# Patient Record
Sex: Female | Born: 1955 | Race: Black or African American | Hispanic: No | Marital: Married | Smoking: Never smoker
Health system: Southern US, Community
[De-identification: ages and names within clinical notes are randomized; demographics above are authoritative.]

## PROBLEM LIST (undated history)

## (undated) DIAGNOSIS — F339 Major depressive disorder, recurrent, unspecified: Secondary | ICD-10-CM

## (undated) DIAGNOSIS — Z950 Presence of cardiac pacemaker: Secondary | ICD-10-CM

## (undated) DIAGNOSIS — R591 Generalized enlarged lymph nodes: Secondary | ICD-10-CM

## (undated) DIAGNOSIS — E785 Hyperlipidemia, unspecified: Secondary | ICD-10-CM

## (undated) DIAGNOSIS — I499 Cardiac arrhythmia, unspecified: Secondary | ICD-10-CM

## (undated) DIAGNOSIS — D352 Benign neoplasm of pituitary gland: Secondary | ICD-10-CM

## (undated) DIAGNOSIS — I2510 Atherosclerotic heart disease of native coronary artery without angina pectoris: Secondary | ICD-10-CM

## (undated) DIAGNOSIS — M1990 Unspecified osteoarthritis, unspecified site: Secondary | ICD-10-CM

## (undated) DIAGNOSIS — N189 Chronic kidney disease, unspecified: Secondary | ICD-10-CM

## (undated) DIAGNOSIS — I495 Sick sinus syndrome: Secondary | ICD-10-CM

## (undated) DIAGNOSIS — I639 Cerebral infarction, unspecified: Secondary | ICD-10-CM

## (undated) DIAGNOSIS — IMO0001 Reserved for inherently not codable concepts without codable children: Secondary | ICD-10-CM

## (undated) HISTORY — DX: Cerebral infarction, unspecified: I63.9

## (undated) HISTORY — DX: Major depressive disorder, recurrent, unspecified: F33.9

## (undated) HISTORY — DX: Unspecified osteoarthritis, unspecified site: M19.90

## (undated) HISTORY — DX: Benign neoplasm of pituitary gland: D35.2

## (undated) HISTORY — DX: Atherosclerotic heart disease of native coronary artery without angina pectoris: I25.10

## (undated) HISTORY — PX: ABDOMINAL HYSTERECTOMY: SHX81

## (undated) HISTORY — DX: Generalized enlarged lymph nodes: R59.1

## (undated) HISTORY — DX: Sick sinus syndrome: I49.5

## (undated) HISTORY — DX: Hyperlipidemia, unspecified: E78.5

## (undated) HISTORY — DX: Chronic kidney disease, unspecified: N18.9

---

## 2005-07-05 ENCOUNTER — Ambulatory Visit: Payer: Self-pay | Admitting: Internal Medicine

## 2006-08-22 ENCOUNTER — Other Ambulatory Visit: Payer: Self-pay

## 2006-08-24 ENCOUNTER — Ambulatory Visit: Payer: Self-pay | Admitting: Obstetrics & Gynecology

## 2006-09-13 ENCOUNTER — Ambulatory Visit: Payer: Self-pay | Admitting: Neurology

## 2007-09-12 ENCOUNTER — Ambulatory Visit: Payer: Self-pay | Admitting: Family Medicine

## 2008-03-07 ENCOUNTER — Other Ambulatory Visit: Payer: Self-pay

## 2008-03-07 ENCOUNTER — Ambulatory Visit: Payer: Self-pay | Admitting: Obstetrics & Gynecology

## 2008-03-11 ENCOUNTER — Inpatient Hospital Stay: Payer: Self-pay | Admitting: Obstetrics & Gynecology

## 2009-01-26 ENCOUNTER — Encounter: Payer: Self-pay | Admitting: Nurse Practitioner

## 2009-02-12 ENCOUNTER — Encounter: Payer: Self-pay | Admitting: Nurse Practitioner

## 2009-03-14 ENCOUNTER — Encounter: Payer: Self-pay | Admitting: Nurse Practitioner

## 2009-04-14 ENCOUNTER — Encounter: Payer: Self-pay | Admitting: Nurse Practitioner

## 2010-10-25 ENCOUNTER — Ambulatory Visit: Payer: Self-pay | Admitting: Family Medicine

## 2011-12-20 ENCOUNTER — Ambulatory Visit: Payer: Self-pay | Admitting: Family Medicine

## 2012-12-20 ENCOUNTER — Ambulatory Visit: Payer: Self-pay | Admitting: Family Medicine

## 2013-11-14 HISTORY — PX: PACEMAKER INSERTION: SHX728

## 2014-06-19 ENCOUNTER — Ambulatory Visit: Payer: Self-pay | Admitting: Cardiology

## 2014-06-19 LAB — BASIC METABOLIC PANEL
Anion Gap: 8 (ref 7–16)
BUN: 12 mg/dL (ref 7–18)
Calcium, Total: 9.1 mg/dL (ref 8.5–10.1)
Chloride: 104 mmol/L (ref 98–107)
Co2: 28 mmol/L (ref 21–32)
Creatinine: 1.23 mg/dL (ref 0.60–1.30)
EGFR (African American): 56 — ABNORMAL LOW
EGFR (Non-African Amer.): 48 — ABNORMAL LOW
Glucose: 100 mg/dL — ABNORMAL HIGH (ref 65–99)
Osmolality: 279 (ref 275–301)
Potassium: 3.4 mmol/L — ABNORMAL LOW (ref 3.5–5.1)
Sodium: 140 mmol/L (ref 136–145)

## 2014-06-19 LAB — CBC WITH DIFFERENTIAL/PLATELET
BASOS PCT: 4.5 %
Basophil #: 0.3 10*3/uL — ABNORMAL HIGH (ref 0.0–0.1)
Eosinophil #: 0.1 10*3/uL (ref 0.0–0.7)
Eosinophil %: 1.5 %
HCT: 42.3 % (ref 35.0–47.0)
HGB: 13.5 g/dL (ref 12.0–16.0)
LYMPHS ABS: 1.2 10*3/uL (ref 1.0–3.6)
LYMPHS PCT: 16.3 %
MCH: 29.5 pg (ref 26.0–34.0)
MCHC: 31.9 g/dL — ABNORMAL LOW (ref 32.0–36.0)
MCV: 92 fL (ref 80–100)
MONO ABS: 0.6 x10 3/mm (ref 0.2–0.9)
Monocyte %: 8.2 %
Neutrophil #: 5.2 10*3/uL (ref 1.4–6.5)
Neutrophil %: 69.5 %
Platelet: 369 10*3/uL (ref 150–440)
RBC: 4.58 10*6/uL (ref 3.80–5.20)
RDW: 15.4 % — ABNORMAL HIGH (ref 11.5–14.5)
WBC: 7.6 10*3/uL (ref 3.6–11.0)

## 2014-06-19 LAB — PROTIME-INR
INR: 1
Prothrombin Time: 13.2 secs (ref 11.5–14.7)

## 2014-06-19 LAB — APTT: Activated PTT: 35.4 secs (ref 23.6–35.9)

## 2014-06-24 ENCOUNTER — Ambulatory Visit: Payer: Self-pay | Admitting: Cardiology

## 2014-07-12 ENCOUNTER — Emergency Department: Payer: Self-pay | Admitting: Emergency Medicine

## 2014-07-12 LAB — CBC WITH DIFFERENTIAL/PLATELET
BASOS PCT: 0.7 %
Basophil #: 0 10*3/uL (ref 0.0–0.1)
EOS PCT: 1.4 %
Eosinophil #: 0.1 10*3/uL (ref 0.0–0.7)
HCT: 44.3 % (ref 35.0–47.0)
HGB: 14.1 g/dL (ref 12.0–16.0)
LYMPHS ABS: 1.9 10*3/uL (ref 1.0–3.6)
LYMPHS PCT: 26 %
MCH: 29.2 pg (ref 26.0–34.0)
MCHC: 31.8 g/dL — ABNORMAL LOW (ref 32.0–36.0)
MCV: 92 fL (ref 80–100)
MONOS PCT: 12.1 %
Monocyte #: 0.9 x10 3/mm (ref 0.2–0.9)
NEUTROS ABS: 4.3 10*3/uL (ref 1.4–6.5)
NEUTROS PCT: 59.8 %
PLATELETS: 291 10*3/uL (ref 150–440)
RBC: 4.83 10*6/uL (ref 3.80–5.20)
RDW: 14.5 % (ref 11.5–14.5)
WBC: 7.1 10*3/uL (ref 3.6–11.0)

## 2014-07-12 LAB — COMPREHENSIVE METABOLIC PANEL
ALBUMIN: 3.4 g/dL (ref 3.4–5.0)
ANION GAP: 10 (ref 7–16)
Alkaline Phosphatase: 78 U/L
BILIRUBIN TOTAL: 0.3 mg/dL (ref 0.2–1.0)
BUN: 15 mg/dL (ref 7–18)
CO2: 28 mmol/L (ref 21–32)
Calcium, Total: 9 mg/dL (ref 8.5–10.1)
Chloride: 99 mmol/L (ref 98–107)
Creatinine: 1.37 mg/dL — ABNORMAL HIGH (ref 0.60–1.30)
EGFR (Non-African Amer.): 42 — ABNORMAL LOW
GFR CALC AF AMER: 49 — AB
GLUCOSE: 102 mg/dL — AB (ref 65–99)
OSMOLALITY: 275 (ref 275–301)
Potassium: 3.4 mmol/L — ABNORMAL LOW (ref 3.5–5.1)
SGOT(AST): 28 U/L (ref 15–37)
SGPT (ALT): 38 U/L
SODIUM: 137 mmol/L (ref 136–145)
Total Protein: 8.4 g/dL — ABNORMAL HIGH (ref 6.4–8.2)

## 2014-07-12 LAB — LIPASE, BLOOD: LIPASE: 374 U/L (ref 73–393)

## 2014-07-12 LAB — TROPONIN I: Troponin-I: <0.02 ng/mL

## 2014-07-13 LAB — URINALYSIS, COMPLETE
Bilirubin,UR: NEGATIVE
Blood: NEGATIVE
Glucose,UR: NEGATIVE mg/dL (ref 0–75)
Hyaline Cast: 1
Ketone: NEGATIVE
Nitrite: POSITIVE
PH: 6 (ref 4.5–8.0)
PROTEIN: NEGATIVE
RBC,UR: 10 /HPF (ref 0–5)
SPECIFIC GRAVITY: 1.02 (ref 1.003–1.030)
Squamous Epithelial: 4
WBC UR: 34 /HPF (ref 0–5)

## 2014-12-26 DIAGNOSIS — I639 Cerebral infarction, unspecified: Secondary | ICD-10-CM | POA: Diagnosis not present

## 2014-12-26 DIAGNOSIS — R001 Bradycardia, unspecified: Secondary | ICD-10-CM | POA: Diagnosis not present

## 2014-12-26 DIAGNOSIS — I495 Sick sinus syndrome: Secondary | ICD-10-CM | POA: Diagnosis not present

## 2014-12-26 DIAGNOSIS — E669 Obesity, unspecified: Secondary | ICD-10-CM | POA: Diagnosis not present

## 2015-01-08 DIAGNOSIS — I495 Sick sinus syndrome: Secondary | ICD-10-CM | POA: Diagnosis not present

## 2015-03-07 NOTE — Op Note (Signed)
PATIENT NAME:  Mary Webb, BENEDICT MR#:  [id number] DATE OF BIRTH:  03-13-1956  DATE OF PROCEDURE:  06/24/2014  PREPROCEDURE DIAGNOSIS: Type 2 second-degree atrioventricular block.   PROCEDURE: Dual chamber pacemaker implantation.   POST PROCEDURE DIAGNOSIS: Atrial sensing with ventricular pacing.   INDICATION: The patient is a 59 year old female who was found to be bradycardic. Holter monitor revealed type 2 second-degree atrioventricular block with 2:1 conduction with a heart rate of 34 BPM.   DESCRIPTION OF PROCEDURE: The risks, benefits and alternatives of permanent pacemaker implantation were explained to the patient and informed written consent was obtained. She was brought to the Operating Room in a fasting state. The left pectoral region was prepped and draped in the usual sterile manner. Anesthesia was obtained with 1% Xylocaine locally. A 6 cm incision was performed over the left pectoral region. The pacemaker pocket was generated by electrocautery and blunt dissection. Access was obtained to the left subclavian vein by fine needle aspiration. MRI compatible ventricular (5076) and atrial (5706) leads were positioned to the right ventricular apical septum and right atrial appendage under fluoroscopic guidance. After proper thresholds were obtained, the leads were sutured in place. The pacemaker pocket was irrigated with gentamicin solution. The leads were connected to an MRI compatible, dual-chamber, rate-responsive pacemaker generator (Medtronic [id number]) and positioned into the pocket. The pocket was closed with 2-0 and 4-0 Vicryl, respectively. Steri-Strips and pressure dressing were applied.    ____________________________ Isaias Cowman, MD ap:TT D: 06/24/2014 15:01:47 ET T: 06/24/2014 17:38:27 ET JOB#: [id number]  cc: Isaias Cowman, MD, <Dictator> Isaias Cowman MD ELECTRONICALLY SIGNED 07/17/2014 15:59

## 2015-04-28 ENCOUNTER — Encounter: Payer: Self-pay | Admitting: Family Medicine

## 2015-04-28 ENCOUNTER — Ambulatory Visit (INDEPENDENT_AMBULATORY_CARE_PROVIDER_SITE_OTHER): Payer: Medicare Other | Admitting: Family Medicine

## 2015-04-28 VITALS — BP 141/83 | HR 71 | Temp 98.1°F | Ht 63.0 in | Wt 356.0 lb

## 2015-04-28 DIAGNOSIS — I6322 Cerebral infarction due to unspecified occlusion or stenosis of basilar arteries: Secondary | ICD-10-CM

## 2015-04-28 DIAGNOSIS — D352 Benign neoplasm of pituitary gland: Secondary | ICD-10-CM | POA: Diagnosis not present

## 2015-04-28 DIAGNOSIS — I495 Sick sinus syndrome: Secondary | ICD-10-CM | POA: Diagnosis not present

## 2015-04-28 DIAGNOSIS — Z6841 Body Mass Index (BMI) 40.0 and over, adult: Secondary | ICD-10-CM | POA: Diagnosis not present

## 2015-04-28 DIAGNOSIS — I639 Cerebral infarction, unspecified: Secondary | ICD-10-CM | POA: Insufficient documentation

## 2015-04-28 DIAGNOSIS — I2510 Atherosclerotic heart disease of native coronary artery without angina pectoris: Secondary | ICD-10-CM

## 2015-04-28 DIAGNOSIS — Z8673 Personal history of transient ischemic attack (TIA), and cerebral infarction without residual deficits: Secondary | ICD-10-CM | POA: Insufficient documentation

## 2015-04-28 DIAGNOSIS — E785 Hyperlipidemia, unspecified: Secondary | ICD-10-CM | POA: Diagnosis not present

## 2015-04-28 DIAGNOSIS — I10 Essential (primary) hypertension: Secondary | ICD-10-CM

## 2015-04-28 DIAGNOSIS — I129 Hypertensive chronic kidney disease with stage 1 through stage 4 chronic kidney disease, or unspecified chronic kidney disease: Secondary | ICD-10-CM | POA: Insufficient documentation

## 2015-04-28 DIAGNOSIS — N183 Chronic kidney disease, stage 3 (moderate): Secondary | ICD-10-CM

## 2015-04-28 MED ORDER — LOSARTAN POTASSIUM 100 MG PO TABS: 100.0000 mg | ORAL_TABLET | Freq: Every day | ORAL | Status: DC

## 2015-04-28 MED ORDER — POTASSIUM CHLORIDE 20 MEQ PO PACK: 20.0000 meq | PACK | Freq: Two times a day (BID) | ORAL | Status: DC

## 2015-04-28 MED ORDER — CHLORTHALIDONE 25 MG PO TABS: 25.0000 mg | ORAL_TABLET | Freq: Every day | ORAL | Status: DC

## 2015-04-28 MED ORDER — CLOPIDOGREL BISULFATE 75 MG PO TABS: 75.0000 mg | ORAL_TABLET | Freq: Every day | ORAL | Status: DC

## 2015-04-28 NOTE — Progress Notes (Signed)
BP 141/83 mmHg  Pulse 71  Temp(Src) 98.1 F (36.7 C)  Ht 5\' 3"  (1.6 m)  Wt 356 lb (161.481 kg)  BMI 63.08 kg/m2  SpO2 98%   Subjective:    Patient ID: Mary Webb, female    DOB: 29-Dec-1955, 59 y.o.   MRN: [id number]  HPI: Mary Webb is a 59 y.o. female  Chief Complaint  Patient presents with  . Hypertension  . Obesity  BP doing well no side effects to meds which are taken regularly. No edema PND No stroke sx no TIA sx Taking Plavix with no problems Pt massively obese  Reviewed sleep apnea sx and no sx   Relevant past medical, surgical, family and social history reviewed and updated as indicated. Interim medical history since our last visit reviewed. Allergies and medications reviewed and updated.  Review of Systems  Constitutional: Negative.   Respiratory: Negative.   Cardiovascular: Negative.     Per HPI unless specifically indicated above     Objective:    BP 141/83 mmHg  Pulse 71  Temp(Src) 98.1 F (36.7 C)  Ht 5\' 3"  (1.6 m)  Wt 356 lb (161.481 kg)  BMI 63.08 kg/m2  SpO2 98%  Wt Readings from Last 3 Encounters:  04/28/15 356 lb (161.481 kg)  10/23/14 343 lb (155.584 kg)    Physical Exam  Constitutional: She is oriented to person, place, and time. She appears well-developed and well-nourished. No distress.  HENT:  Head: Normocephalic and atraumatic.  Right Ear: Hearing normal.  Left Ear: Hearing normal.  Nose: Nose normal.  Eyes: Conjunctivae and lids are normal. Right eye exhibits no discharge. Left eye exhibits no discharge. No scleral icterus.  Cardiovascular: Normal rate, regular rhythm and normal heart sounds.   Pulmonary/Chest: Effort normal and breath sounds normal. No respiratory distress.  Musculoskeletal: Normal range of motion.  Neurological: She is alert and oriented to person, place, and time.  Skin: Skin is intact. No rash noted.  Psychiatric: She has a normal mood and affect. Her speech is normal and behavior is normal.  Judgment and thought content normal. Cognition and memory are normal.        Assessment & Plan:   Problem List Items Addressed This Visit      Cardiovascular and Mediastinum   Sick sinus syndrome (Chronic)   Relevant Medications   chlorthalidone (HYGROTON) 25 MG tablet   losartan (COZAAR) 100 MG tablet   Other Relevant Orders   CBC with Differential/Platelet   Urinalysis, Routine w reflex microscopic (not at [facility])   TSH   Lipid panel   Comprehensive metabolic panel   Hypertension (Chronic)    The current medical regimen is effective; continue present plan and medications      Relevant Medications   chlorthalidone (HYGROTON) 25 MG tablet   losartan (COZAAR) 100 MG tablet   potassium chloride (KLOR-CON) 20 MEQ packet   Other Relevant Orders   CBC with Differential/Platelet   Urinalysis, Routine w reflex microscopic (not at [facility])   TSH   Lipid panel   Comprehensive metabolic panel     Endocrine   Pituitary adenoma (Chronic)   Relevant Orders   CBC with Differential/Platelet   Urinalysis, Routine w reflex microscopic (not at [facility])   TSH   Lipid panel   Comprehensive metabolic panel     Nervous and Auditory   CVA (cerebral infarction)    The current medical regimen is effective; continue present plan and medications  Relevant Medications   clopidogrel (PLAVIX) 75 MG tablet   Other Relevant Orders   CBC with Differential/Platelet   Urinalysis, Routine w reflex microscopic (not at [facility])   TSH   Lipid panel   Comprehensive metabolic panel     Other   BMI 60.0-69.9, adult (Chronic)    Discussed diet and exercise and weight loss      Relevant Orders   CBC with Differential/Platelet   Urinalysis, Routine w reflex microscopic (not at [facility])   TSH   Lipid panel   Comprehensive metabolic panel   Hyperlipidemia (Chronic)   Relevant Medications   chlorthalidone (HYGROTON) 25 MG tablet   losartan (COZAAR) 100 MG tablet   Other Relevant Orders   CBC with  Differential/Platelet   Urinalysis, Routine w reflex microscopic (not at [facility])   TSH   Lipid panel   Comprehensive metabolic panel    Other Visit Diagnoses    Atherosclerosis of native coronary artery of native heart without angina pectoris    -  Primary    Relevant Medications    chlorthalidone (HYGROTON) 25 MG tablet    losartan (COZAAR) 100 MG tablet    Other Relevant Orders    Basic metabolic panel    CBC with Differential/Platelet    Urinalysis, Routine w reflex microscopic (not at [facility])    TSH    Lipid panel    Comprehensive metabolic panel        Follow up plan: Return in about 6 months (around 10/28/2015) for Physical Exam.

## 2015-04-28 NOTE — Assessment & Plan Note (Signed)
The current medical regimen is effective;  continue present plan and medications.  

## 2015-04-28 NOTE — Assessment & Plan Note (Signed)
Discussed diet and exercise and weight loss.

## 2015-04-29 LAB — BASIC METABOLIC PANEL
BUN/Creatinine Ratio: 11 (ref 9–23)
BUN: 12 mg/dL (ref 6–24)
CALCIUM: 9.6 mg/dL (ref 8.7–10.2)
CHLORIDE: 99 mmol/L (ref 97–108)
CO2: 28 mmol/L (ref 18–29)
Creatinine, Ser: 1.08 mg/dL — ABNORMAL HIGH (ref 0.57–1.00)
GFR calc Af Amer: 65 mL/min/{1.73_m2} (ref >59)
GFR calc non Af Amer: 56 mL/min/{1.73_m2} — ABNORMAL LOW (ref >59)
GLUCOSE: 115 mg/dL — AB (ref 65–99)
POTASSIUM: 3.5 mmol/L (ref 3.5–5.2)
Sodium: 140 mmol/L (ref 134–144)

## 2015-04-29 NOTE — Addendum Note (Signed)
Addended byGolden Pop on: 04/29/2015 12:31 PM   Modules accepted: Miquel Dunn

## 2015-07-07 DIAGNOSIS — I495 Sick sinus syndrome: Secondary | ICD-10-CM | POA: Diagnosis not present

## 2015-07-13 DIAGNOSIS — E669 Obesity, unspecified: Secondary | ICD-10-CM | POA: Diagnosis not present

## 2015-07-13 DIAGNOSIS — R001 Bradycardia, unspecified: Secondary | ICD-10-CM | POA: Diagnosis not present

## 2015-07-13 DIAGNOSIS — I639 Cerebral infarction, unspecified: Secondary | ICD-10-CM | POA: Diagnosis not present

## 2015-07-13 DIAGNOSIS — G4733 Obstructive sleep apnea (adult) (pediatric): Secondary | ICD-10-CM | POA: Diagnosis not present

## 2015-07-21 DIAGNOSIS — H40019 Open angle with borderline findings, low risk, unspecified eye: Secondary | ICD-10-CM | POA: Diagnosis not present

## 2015-08-10 DIAGNOSIS — H40019 Open angle with borderline findings, low risk, unspecified eye: Secondary | ICD-10-CM | POA: Diagnosis not present

## 2015-09-25 IMAGING — CR DG CHEST 2V
1 series · 2 of 2 positions shown · non-contrast
Comparison: None.

CLINICAL DATA: Hypertension.

EXAM:
CHEST  2 VIEW

[Series 1: w chest pa · 0.14mm/px · 2 of 2 slices shown]
[im 1/2]
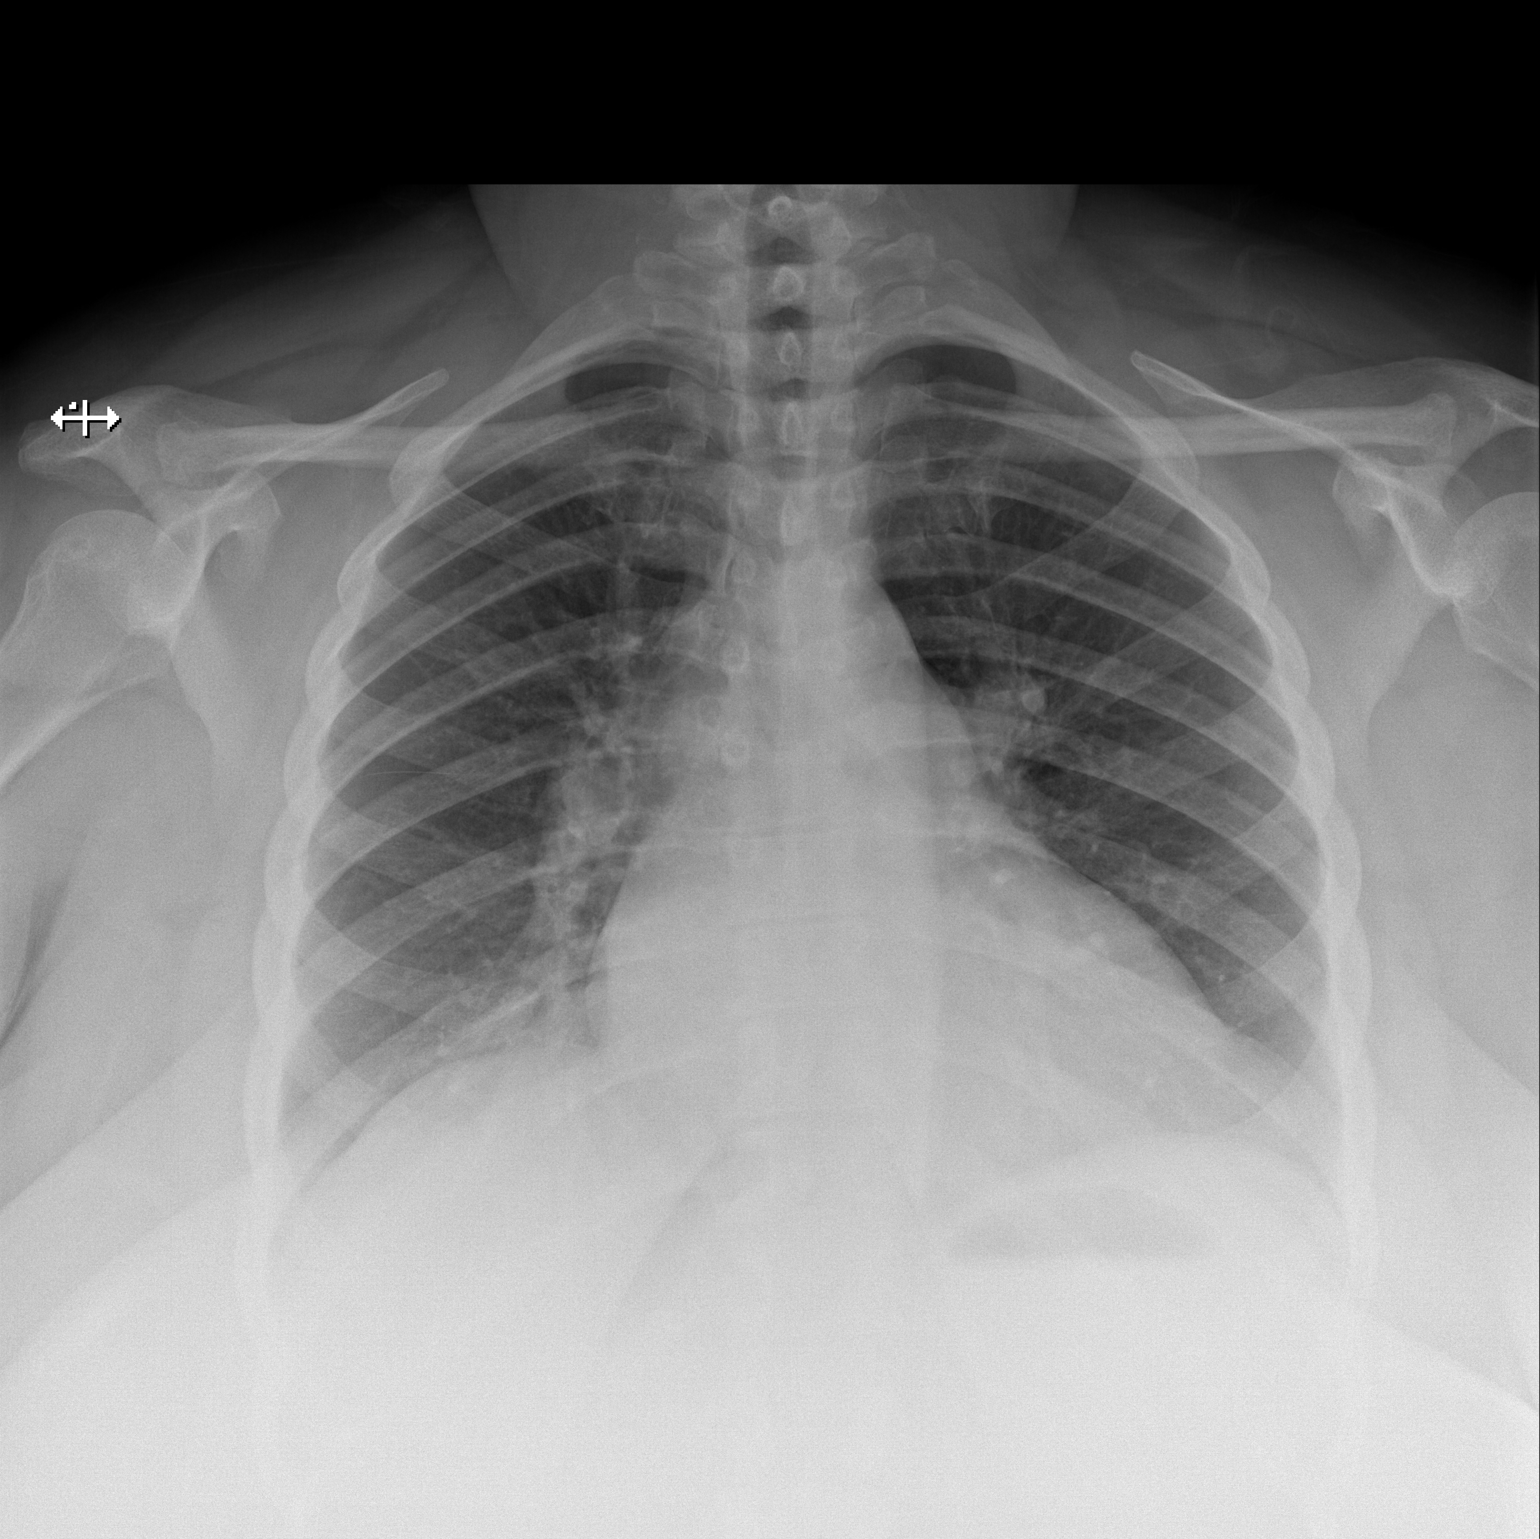
[im 2/2]
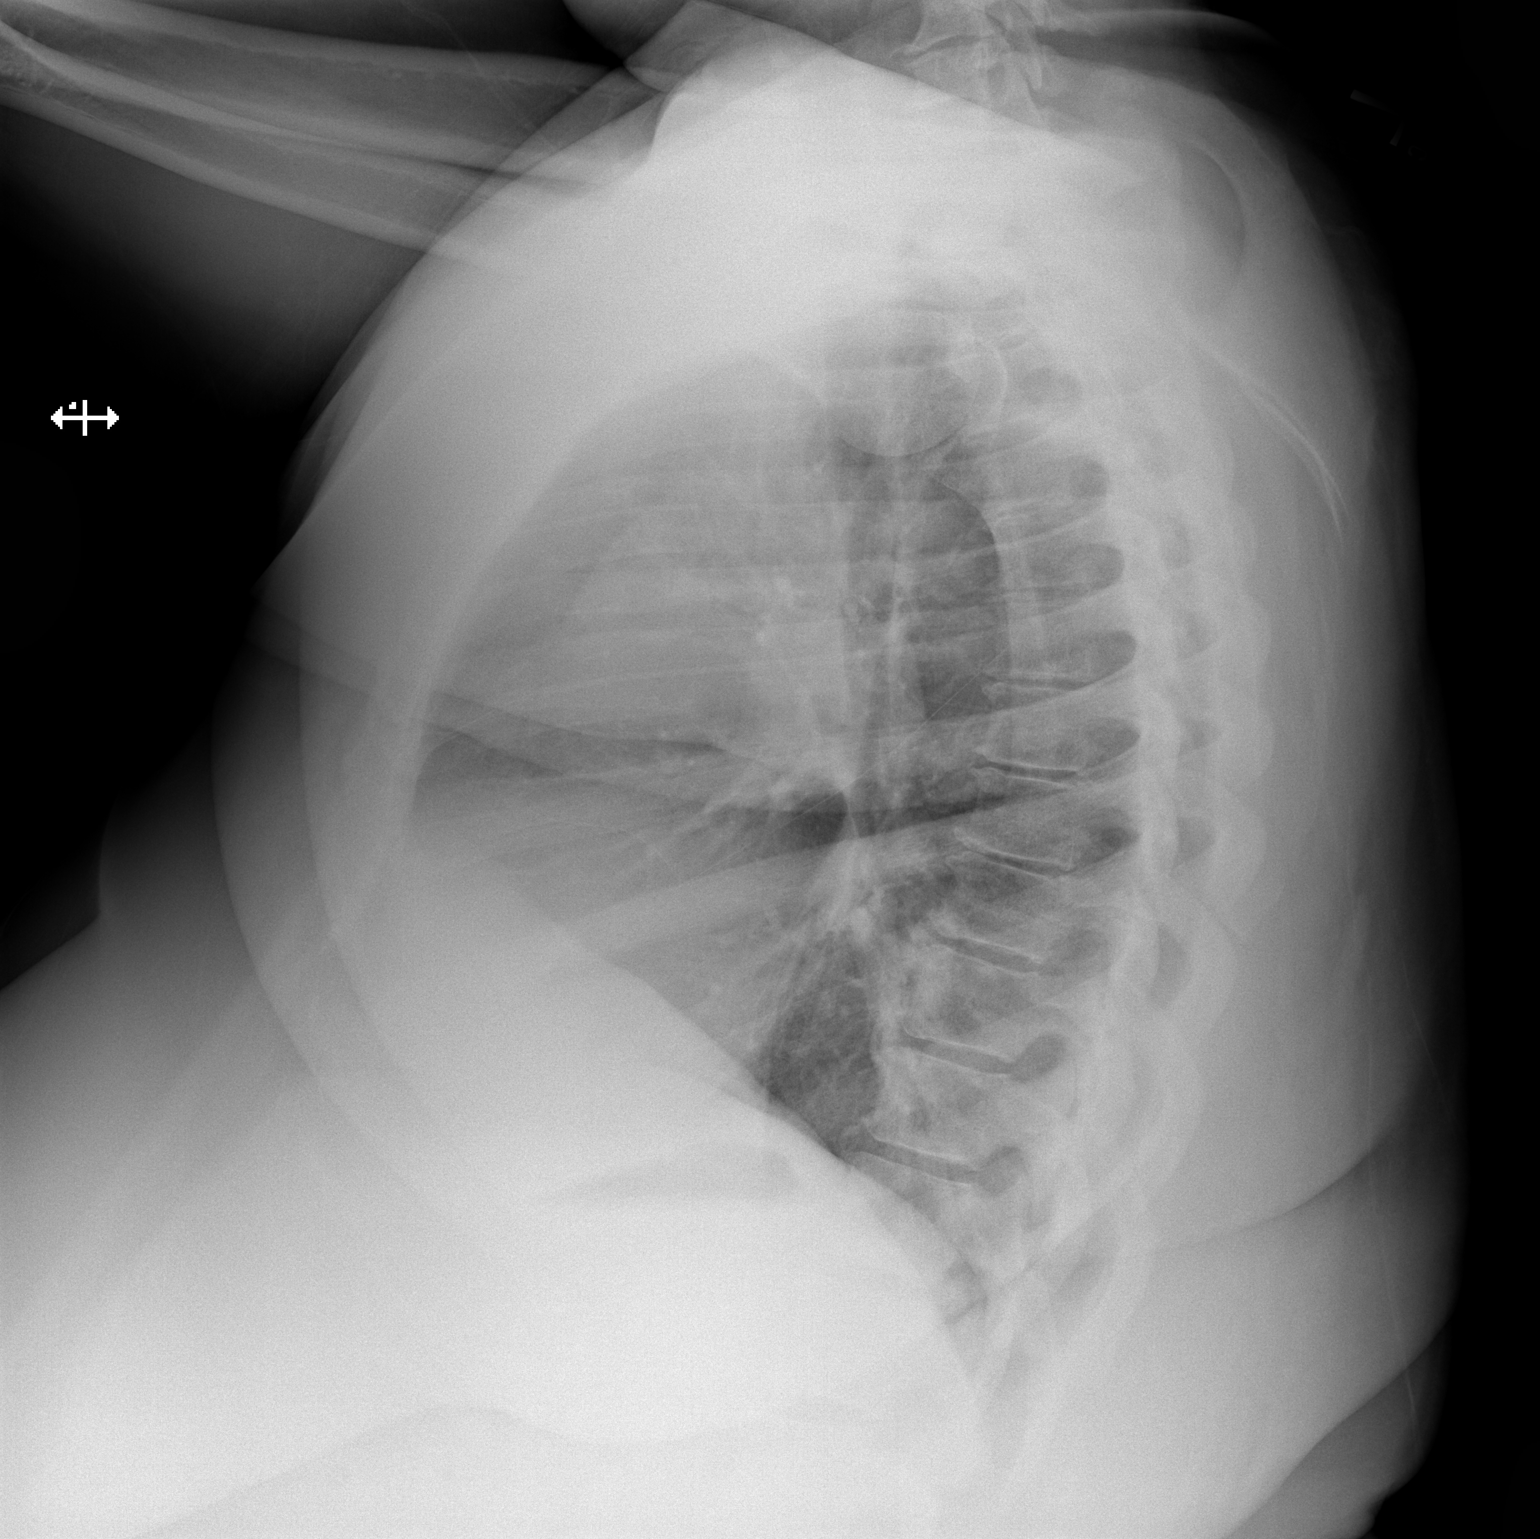

[2 of 2 positions shown; findings below may reference images not displayed]

FINDINGS: Cardiomegaly. Pulmonary vascularity is normal. Mild right base
atelectasis versus infiltrate. No pleural effusion or pneumothorax.
No acute bony abnormality .
IMPRESSION: 1. Mild right base atelectasis and/or infiltrate.
2. Cardiomegaly.  No evidence of overt congestive heart failure.

## 2015-09-30 IMAGING — CR DG CHEST 1V PORT
1 series · 1 of 1 positions shown · non-contrast
Comparison: 06/19/2014

CLINICAL DATA: Status post pacemaker placement

EXAM:
PORTABLE CHEST - 1 VIEW

[ap]
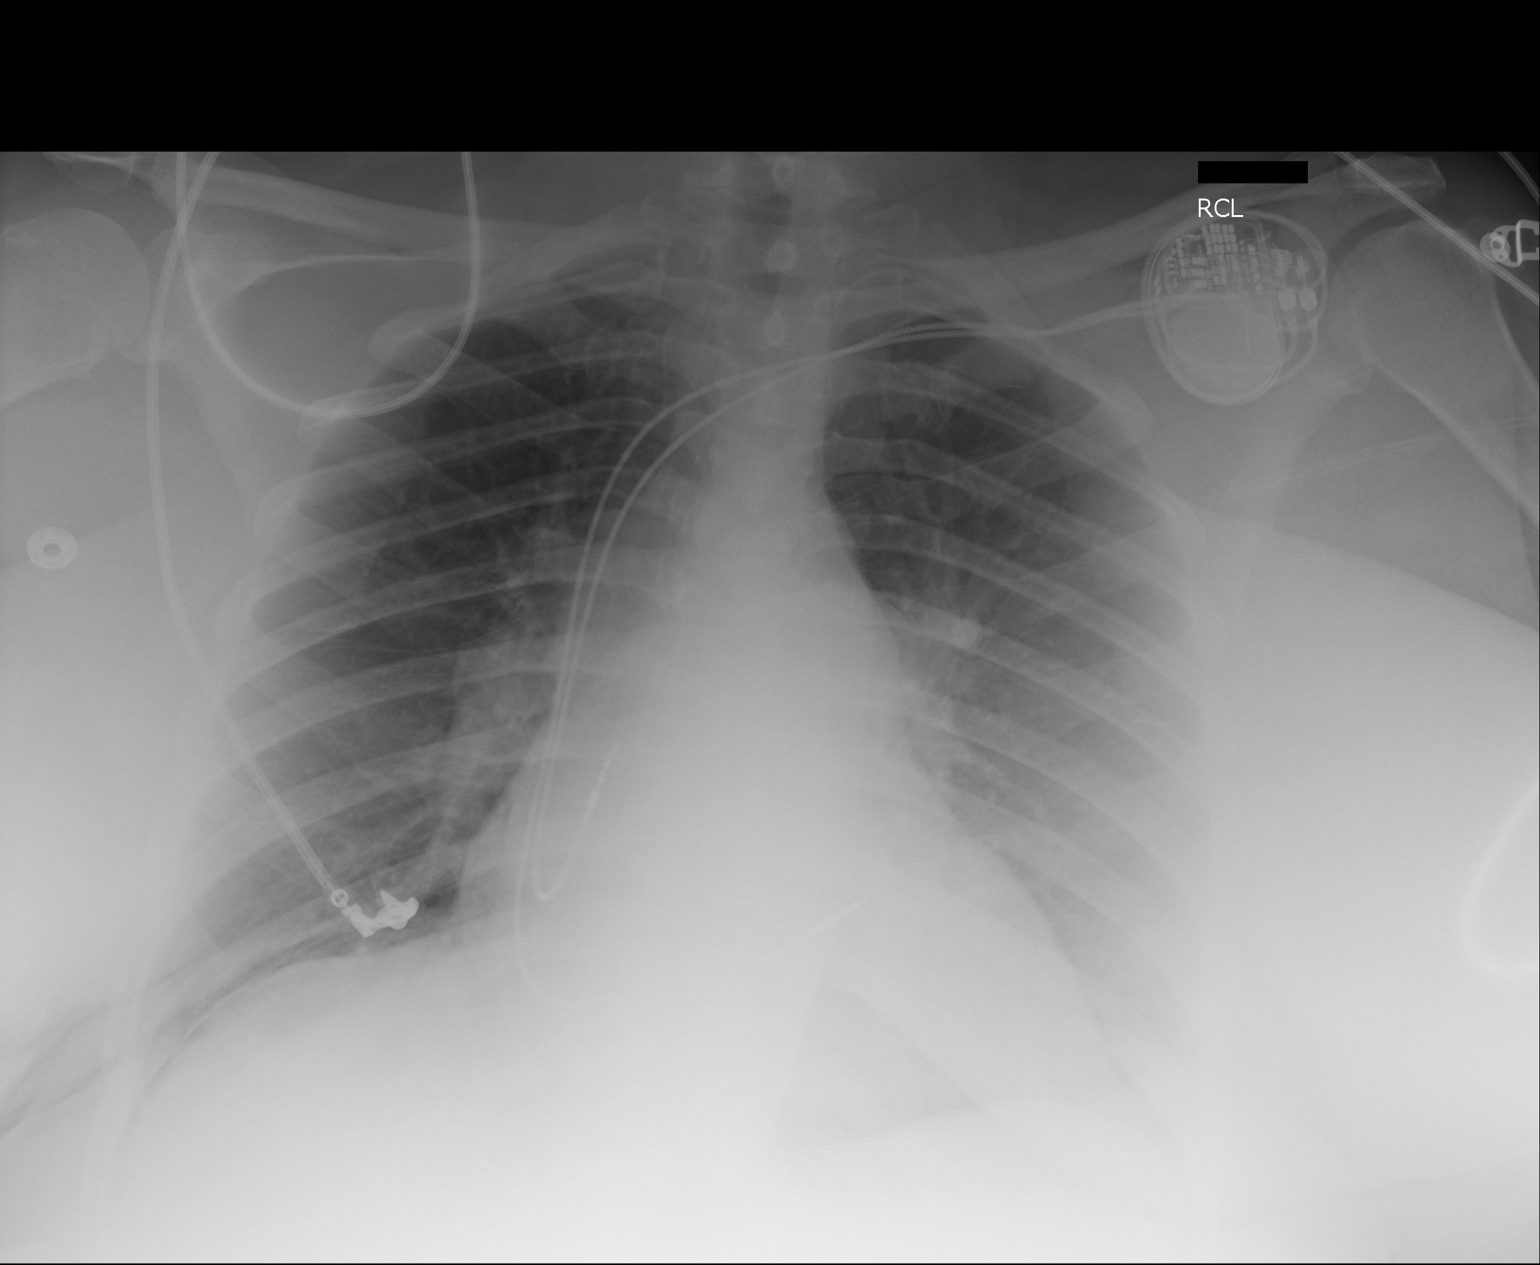

[1 of 1 positions shown; findings below may reference images not displayed]

FINDINGS: A pacing device has been placed in the interim. No pneumothorax is
noted. The lungs are clear. The cardiac shadow is stable.
IMPRESSION: No pneumothorax following pacer device placement.

## 2015-10-26 ENCOUNTER — Ambulatory Visit (INDEPENDENT_AMBULATORY_CARE_PROVIDER_SITE_OTHER): Payer: Medicare Other | Admitting: Family Medicine

## 2015-10-26 ENCOUNTER — Encounter: Payer: Self-pay | Admitting: Family Medicine

## 2015-10-26 VITALS — BP 118/79 | HR 66 | Temp 98.6°F | Ht 62.3 in | Wt 371.0 lb

## 2015-10-26 DIAGNOSIS — Z1211 Encounter for screening for malignant neoplasm of colon: Secondary | ICD-10-CM | POA: Diagnosis not present

## 2015-10-26 DIAGNOSIS — I10 Essential (primary) hypertension: Secondary | ICD-10-CM | POA: Diagnosis not present

## 2015-10-26 DIAGNOSIS — I495 Sick sinus syndrome: Secondary | ICD-10-CM

## 2015-10-26 DIAGNOSIS — Z1239 Encounter for other screening for malignant neoplasm of breast: Secondary | ICD-10-CM | POA: Diagnosis not present

## 2015-10-26 DIAGNOSIS — N183 Chronic kidney disease, stage 3 (moderate): Secondary | ICD-10-CM | POA: Diagnosis not present

## 2015-10-26 DIAGNOSIS — Z6841 Body Mass Index (BMI) 40.0 and over, adult: Secondary | ICD-10-CM | POA: Diagnosis not present

## 2015-10-26 DIAGNOSIS — I129 Hypertensive chronic kidney disease with stage 1 through stage 4 chronic kidney disease, or unspecified chronic kidney disease: Secondary | ICD-10-CM | POA: Diagnosis not present

## 2015-10-26 DIAGNOSIS — D352 Benign neoplasm of pituitary gland: Secondary | ICD-10-CM

## 2015-10-26 DIAGNOSIS — I6322 Cerebral infarction due to unspecified occlusion or stenosis of basilar arteries: Secondary | ICD-10-CM | POA: Diagnosis not present

## 2015-10-26 DIAGNOSIS — Z0000 Encounter for general adult medical examination without abnormal findings: Secondary | ICD-10-CM

## 2015-10-26 LAB — URINALYSIS, ROUTINE W REFLEX MICROSCOPIC
Bilirubin, UA: NEGATIVE
GLUCOSE, UA: NEGATIVE
Ketones, UA: NEGATIVE
Nitrite, UA: NEGATIVE
PH UA: 7.5 (ref 5.0–7.5)
PROTEIN UA: NEGATIVE
RBC, UA: NEGATIVE
Specific Gravity, UA: 1.02 (ref 1.005–1.030)
UUROB: 1 mg/dL (ref 0.2–1.0)

## 2015-10-26 LAB — MICROSCOPIC EXAMINATION

## 2015-10-26 MED ORDER — POTASSIUM CHLORIDE 20 MEQ PO PACK: 20.0000 meq | PACK | Freq: Two times a day (BID) | ORAL | Status: DC

## 2015-10-26 MED ORDER — CHLORTHALIDONE 25 MG PO TABS: 25.0000 mg | ORAL_TABLET | Freq: Every day | ORAL | Status: DC

## 2015-10-26 MED ORDER — LOSARTAN POTASSIUM 100 MG PO TABS: 100.0000 mg | ORAL_TABLET | Freq: Every day | ORAL | Status: DC

## 2015-10-26 MED ORDER — CLOPIDOGREL BISULFATE 75 MG PO TABS: 75.0000 mg | ORAL_TABLET | Freq: Every day | ORAL | Status: DC

## 2015-10-26 NOTE — Assessment & Plan Note (Signed)
Followed by cardiology 

## 2015-10-26 NOTE — Progress Notes (Signed)
BP 118/79 mmHg  Pulse 66  Temp(Src) 98.6 F (37 C)  Ht 5' 2.3" (1.582 m)  Wt 371 lb (168.284 kg)  BMI 67.24 kg/m2  SpO2 99%   Subjective:    Patient ID: Mary Webb, female    DOB: [birth year], 59 y.o.   MRN: [id number]  HPI: Mary Webb is a 59 y.o. female  Chief Complaint  Patient presents with  . Annual Exam   doing well concerned about weight has been trying to do better with husband's diabetic diet which she is on also he is doing well and she is struggling Blood pressures been doing well with no complaints No chest pain chest tightness No complaints with edema No complaints from medications which he takes faithfully No visual complaints sees her eye doctor from time to time with no visual field concerns Has not had any follow-up of pituitary adenoma for some time. On chart review looks like last one was 2012 Patient followed with cardiology on pacemaker  Relevant past medical, surgical, family and social history reviewed and updated as indicated. Interim medical history since our last visit reviewed. Allergies and medications reviewed and updated.  Review of Systems  Constitutional: Negative.   HENT: Negative.   Eyes: Negative.   Respiratory: Negative.   Cardiovascular: Negative.   Gastrointestinal: Negative.   Endocrine: Negative.   Genitourinary: Negative.   Musculoskeletal: Negative.   Skin: Negative.   Allergic/Immunologic: Negative.   Neurological: Negative.   Hematological: Negative.   Psychiatric/Behavioral: Negative.     Per HPI unless specifically indicated above     Objective:    BP 118/79 mmHg  Pulse 66  Temp(Src) 98.6 F (37 C)  Ht 5' 2.3" (1.582 m)  Wt 371 lb (168.284 kg)  BMI 67.24 kg/m2  SpO2 99%  Wt Readings from Last 3 Encounters:  10/26/15 371 lb (168.284 kg)  04/28/15 356 lb (161.481 kg)  10/23/14 343 lb (155.584 kg)    Physical Exam  Constitutional: She is oriented to person, place, and time. She appears  well-developed and well-nourished.  HENT:  Head: Normocephalic and atraumatic.  Right Ear: External ear normal.  Left Ear: External ear normal.  Nose: Nose normal.  Mouth/Throat: Oropharynx is clear and moist.  Eyes: Conjunctivae and EOM are normal. Pupils are equal, round, and reactive to light.  Neck: Normal range of motion. Neck supple. Carotid bruit is not present.  Cardiovascular: Normal rate, regular rhythm and normal heart sounds.   No murmur heard. Pulmonary/Chest: Effort normal and breath sounds normal. She exhibits no mass. Right breast exhibits no mass, no skin change and no tenderness. Left breast exhibits no mass, no skin change and no tenderness. Breasts are symmetrical.  Abdominal: Soft. Bowel sounds are normal. There is no hepatosplenomegaly.  Musculoskeletal: Normal range of motion.  Neurological: She is alert and oriented to person, place, and time.  Skin: No rash noted.  Psychiatric: She has a normal mood and affect. Her behavior is normal. Judgment and thought content normal.    Results for orders placed or performed in visit on [id number]  Basic metabolic panel  Result Value Ref Range   Glucose 115 (H) 65 - 99 mg/dL   BUN 12 6 - 24 mg/dL   Creatinine, Ser 1.08 (H) 0.57 - 1.00 mg/dL   GFR calc non Af Amer 56 (L) >59 mL/min/1.73   GFR calc Af Amer 65 >59 mL/min/1.73   BUN/Creatinine Ratio 11 9 - 23   Sodium 140 134 - 144  mmol/L   Potassium 3.5 3.5 - 5.2 mmol/L   Chloride 99 97 - 108 mmol/L   CO2 28 18 - 29 mmol/L   Calcium 9.6 8.7 - 10.2 mg/dL      Assessment & Plan:   Problem List Items Addressed This Visit      Cardiovascular and Mediastinum   Sick sinus syndrome (HCC) (Chronic)    Followed by cardiology      Relevant Medications   chlorthalidone (HYGROTON) 25 MG tablet   clopidogrel (PLAVIX) 75 MG tablet   losartan (COZAAR) 100 MG tablet   Other Relevant Orders   Comprehensive metabolic panel   Lipid panel   CBC with Differential/Platelet    Urinalysis, Routine w reflex microscopic (not at [facility])   TSH     Endocrine   Pituitary adenoma ([place]) (Chronic)    Patient with no symptoms visual or otherwise On chart review patient's last MRI was 2012 Patient now with pacemaker After discussion with radiology will find out if pacemaker is MRI safe compatible if so will schedule MRI if not will need CT scan with focus on the sella      Relevant Orders   Comprehensive metabolic panel   Lipid panel   CBC with Differential/Platelet   Urinalysis, Routine w reflex microscopic (not at [facility])   TSH     Nervous and Auditory   CVA (cerebral infarction)   Relevant Medications   clopidogrel (PLAVIX) 75 MG tablet   Other Relevant Orders   Lipid panel   CBC with Differential/Platelet   Urinalysis, Routine w reflex microscopic (not at [facility])   TSH     Genitourinary   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       Relevant Medications   chlorthalidone (HYGROTON) 25 MG tablet   losartan (COZAAR) 100 MG tablet   potassium chloride (KLOR-CON) 20 MEQ packet   Other Relevant Orders   Comprehensive metabolic panel   Lipid panel   CBC with Differential/Platelet   Urinalysis, Routine w reflex microscopic (not at [facility])   TSH     Other   BMI 60.0-69.9, adult (HCC) (Chronic)    Discussed diet and ewxercise       Other Visit Diagnoses    Colon cancer screening    -  Primary    Relevant Orders    Ambulatory referral to General Surgery    Breast cancer screening        Relevant Orders    MM Digital Screening    PE (physical exam), annual        Essential hypertension  (Chronic)       Relevant Medications    chlorthalidone (HYGROTON) 25 MG tablet    losartan (COZAAR) 100 MG tablet    potassium chloride (KLOR-CON) 20 MEQ packet        Follow up plan: Return in about 6 months (around 04/25/2016) for BMP.

## 2015-10-26 NOTE — Assessment & Plan Note (Signed)
Discussed diet and ewxercise

## 2015-10-26 NOTE — Assessment & Plan Note (Signed)
The current medical regimen is effective;  continue present plan and medications.  

## 2015-10-26 NOTE — Assessment & Plan Note (Signed)
Patient with no symptoms visual or otherwise On chart review patient's last MRI was 2012 Patient now with pacemaker After discussion with radiology will find out if pacemaker is MRI safe compatible if so will schedule MRI if not will need CT scan with focus on the sella

## 2015-10-27 ENCOUNTER — Encounter: Payer: Self-pay | Admitting: Family Medicine

## 2015-10-27 LAB — LIPID PANEL
Chol/HDL Ratio: 3.6 ratio units (ref 0.0–4.4)
Cholesterol, Total: 153 mg/dL (ref 100–199)
HDL: 42 mg/dL (ref >39)
LDL CALC: 93 mg/dL (ref 0–99)
Triglycerides: 89 mg/dL (ref 0–149)
VLDL CHOLESTEROL CAL: 18 mg/dL (ref 5–40)

## 2015-10-27 LAB — COMPREHENSIVE METABOLIC PANEL
ALBUMIN: 4 g/dL (ref 3.6–4.8)
ALT: 18 IU/L (ref 0–32)
AST: 14 IU/L (ref 0–40)
Albumin/Globulin Ratio: 1.3 (ref 1.1–2.5)
Alkaline Phosphatase: 76 IU/L (ref 39–117)
BUN / CREAT RATIO: 12 (ref 11–26)
BUN: 15 mg/dL (ref 8–27)
Bilirubin Total: 0.4 mg/dL (ref 0.0–1.2)
CALCIUM: 9.4 mg/dL (ref 8.7–10.3)
CO2: 27 mmol/L (ref 18–29)
CREATININE: 1.21 mg/dL — AB (ref 0.57–1.00)
Chloride: 98 mmol/L (ref 96–106)
GFR calc non Af Amer: 49 mL/min/{1.73_m2} — ABNORMAL LOW (ref >59)
GFR, EST AFRICAN AMERICAN: 56 mL/min/{1.73_m2} — AB (ref >59)
GLUCOSE: 105 mg/dL — AB (ref 65–99)
Globulin, Total: 3.1 g/dL (ref 1.5–4.5)
Potassium: 3.9 mmol/L (ref 3.5–5.2)
Sodium: 139 mmol/L (ref 134–144)
TOTAL PROTEIN: 7.1 g/dL (ref 6.0–8.5)

## 2015-10-27 LAB — CBC WITH DIFFERENTIAL/PLATELET
BASOS: 0 %
Basophils Absolute: 0 10*3/uL (ref 0.0–0.2)
EOS (ABSOLUTE): 0.1 10*3/uL (ref 0.0–0.4)
Eos: 2 %
Hematocrit: 42.7 % (ref 34.0–46.6)
Hemoglobin: 14.2 g/dL (ref 11.1–15.9)
IMMATURE GRANULOCYTES: 0 %
Immature Grans (Abs): 0 10*3/uL (ref 0.0–0.1)
LYMPHS ABS: 2.2 10*3/uL (ref 0.7–3.1)
Lymphs: 32 %
MCH: 29.3 pg (ref 26.6–33.0)
MCHC: 33.3 g/dL (ref 31.5–35.7)
MCV: 88 fL (ref 79–97)
MONOS ABS: 0.7 10*3/uL (ref 0.1–0.9)
Monocytes: 11 %
Neutrophils Absolute: 3.8 10*3/uL (ref 1.4–7.0)
Neutrophils: 55 %
PLATELETS: 295 10*3/uL (ref 150–379)
RBC: 4.84 x10E6/uL (ref 3.77–5.28)
RDW: 14.8 % (ref 12.3–15.4)
WBC: 6.9 10*3/uL (ref 3.4–10.8)

## 2015-10-27 LAB — TSH: TSH: 1.86 u[IU]/mL (ref 0.450–4.500)

## 2015-11-25 ENCOUNTER — Telehealth: Payer: Self-pay | Admitting: Gastroenterology

## 2015-11-25 ENCOUNTER — Other Ambulatory Visit: Payer: Self-pay

## 2015-11-25 NOTE — Telephone Encounter (Signed)
Gastroenterology Pre-Procedure Review  Request Date: 12/10/15 Requesting Physician: Dr. Jeananne Rama   PATIENT REVIEW QUESTIONS: The patient responded to the following health history questions as indicated:    1. Are you having any GI issues? no 2. Do you have a personal history of Polyps? no 3. Do you have a family history of Colon Cancer or Polyps? no 4. Diabetes Mellitus? no 5. Joint replacements in the past 12 months?no 6. Major health problems in the past 3 months?no 7. Any artificial heart valves, MVP, or defibrillator? pacemaker    MEDICATIONS & ALLERGIES:    Patient reports the following regarding taking any anticoagulation/antiplatelet therapy:   Plavix, Coumadin, Eliquis, Xarelto, Lovenox, Pradaxa, Brilinta, or Effient? yes (Plavix 75mg ) Aspirin? no  Patient confirms/reports the following medications:  Current Outpatient Prescriptions  Medication Sig Dispense Refill  . Ascorbic Acid (VITAMIN C) 100 MG tablet Take 100 mg by mouth daily.    . chlorthalidone (HYGROTON) 25 MG tablet Take 1 tablet (25 mg total) by mouth daily. 90 tablet 4  . cholecalciferol (VITAMIN D) 400 UNITS TABS tablet Take 400 Units by mouth.    . clopidogrel (PLAVIX) 75 MG tablet Take 1 tablet (75 mg total) by mouth daily. 90 tablet 4  . losartan (COZAAR) 100 MG tablet Take 1 tablet (100 mg total) by mouth daily. 90 tablet 4  . potassium chloride (KLOR-CON) 20 MEQ packet Take 20 mEq by mouth 2 (two) times daily. 180 tablet 4  . pyridOXINE (VITAMIN B-6) 25 MG tablet Take by mouth.     No current facility-administered medications for this visit.    Patient confirms/reports the following allergies:  Allergies  Allergen Reactions  . Levaquin [Levofloxacin] Other (See Comments)    Stroke  . Tekturna [Aliskiren] Shortness Of Breath  . Accupril [Quinapril Hcl]   . Aspirin   . Diovan [Valsartan]   . Erythromycin   . Iodine   . Lotensin [Benazepril]   . Norvasc [Amlodipine]   . Sulfa Antibiotics   .  Tetanus Toxoids   . Tetracyclines & Related   . Penicillins Rash    No orders of the defined types were placed in this encounter.    AUTHORIZATION INFORMATION Primary Insurance: 1D#: Group #:  Secondary Insurance: 1D#: Group #:  SCHEDULE INFORMATION: Date: 12/10/15 Time: Location: [place]

## 2015-11-25 NOTE — Telephone Encounter (Signed)
Pt scheduled for screening colonoscopy at [facility] on 12/10/15. Pt has [facility] Medicare which requires precert. Will you check? Thanks!

## 2015-11-25 NOTE — Telephone Encounter (Signed)
Patient stated she has called several times and not able to speak to anyone regarding a colonoscopy.

## 2015-12-03 ENCOUNTER — Telehealth: Payer: Self-pay | Admitting: Family Medicine

## 2015-12-03 ENCOUNTER — Telehealth: Payer: Self-pay

## 2015-12-03 NOTE — Telephone Encounter (Signed)
Messaged Park Liter, DO from Dr. Rance Muir office. Okayed from her for pt to stop Plavix 5 days prior to her Colonoscopy that is scheduled for 12/10/15. Pt has been notified.

## 2015-12-03 NOTE — Telephone Encounter (Signed)
Dr. Allen Norris wants plavix stopped. OK not to stop. Can stop for 5 days. CMA informed.

## 2015-12-04 ENCOUNTER — Encounter: Payer: Self-pay | Admitting: Anesthesiology

## 2015-12-04 ENCOUNTER — Other Ambulatory Visit: Payer: Self-pay

## 2015-12-10 ENCOUNTER — Ambulatory Visit: Admission: RE | Admit: 2015-12-10 | Payer: Medicare Other | Source: Ambulatory Visit | Admitting: Gastroenterology

## 2015-12-10 ENCOUNTER — Encounter: Admission: RE | Payer: Self-pay | Source: Ambulatory Visit

## 2015-12-10 HISTORY — DX: Presence of cardiac pacemaker: Z95.0

## 2015-12-10 HISTORY — DX: Cerebral infarction, unspecified: I63.9

## 2015-12-10 HISTORY — DX: Reserved for inherently not codable concepts without codable children: IMO0001

## 2015-12-10 HISTORY — DX: Cardiac arrhythmia, unspecified: I49.9

## 2015-12-10 SURGERY — COLONOSCOPY WITH PROPOFOL
Anesthesia: Choice

## 2015-12-22 DIAGNOSIS — I495 Sick sinus syndrome: Secondary | ICD-10-CM | POA: Diagnosis not present

## 2016-01-04 ENCOUNTER — Encounter: Payer: Self-pay | Admitting: *Deleted

## 2016-01-05 ENCOUNTER — Ambulatory Visit
Admission: RE | Admit: 2016-01-05 | Discharge: 2016-01-05 | Disposition: A | Discharge status: Home / Self Care | Payer: Medicare Other | Source: Ambulatory Visit | Attending: Gastroenterology | Admitting: Gastroenterology

## 2016-01-05 ENCOUNTER — Ambulatory Visit: Payer: Medicare Other | Admitting: Anesthesiology

## 2016-01-05 ENCOUNTER — Encounter: Payer: Self-pay | Admitting: Anesthesiology

## 2016-01-05 ENCOUNTER — Encounter
Admission: RE | Disposition: A | Discharge status: Home / Self Care | Payer: Self-pay | Source: Ambulatory Visit | Attending: Gastroenterology

## 2016-01-05 DIAGNOSIS — K641 Second degree hemorrhoids: Secondary | ICD-10-CM | POA: Insufficient documentation

## 2016-01-05 DIAGNOSIS — Z7902 Long term (current) use of antithrombotics/antiplatelets: Secondary | ICD-10-CM | POA: Diagnosis not present

## 2016-01-05 DIAGNOSIS — Z8673 Personal history of transient ischemic attack (TIA), and cerebral infarction without residual deficits: Secondary | ICD-10-CM | POA: Insufficient documentation

## 2016-01-05 DIAGNOSIS — Z8249 Family history of ischemic heart disease and other diseases of the circulatory system: Secondary | ICD-10-CM | POA: Diagnosis not present

## 2016-01-05 DIAGNOSIS — Z83511 Family history of glaucoma: Secondary | ICD-10-CM | POA: Insufficient documentation

## 2016-01-05 DIAGNOSIS — I129 Hypertensive chronic kidney disease with stage 1 through stage 4 chronic kidney disease, or unspecified chronic kidney disease: Secondary | ICD-10-CM | POA: Insufficient documentation

## 2016-01-05 DIAGNOSIS — Z888 Allergy status to other drugs, medicaments and biological substances status: Secondary | ICD-10-CM | POA: Insufficient documentation

## 2016-01-05 DIAGNOSIS — Z8349 Family history of other endocrine, nutritional and metabolic diseases: Secondary | ICD-10-CM | POA: Insufficient documentation

## 2016-01-05 DIAGNOSIS — Z882 Allergy status to sulfonamides status: Secondary | ICD-10-CM | POA: Insufficient documentation

## 2016-01-05 DIAGNOSIS — Z886 Allergy status to analgesic agent status: Secondary | ICD-10-CM | POA: Diagnosis not present

## 2016-01-05 DIAGNOSIS — Z79899 Other long term (current) drug therapy: Secondary | ICD-10-CM | POA: Diagnosis not present

## 2016-01-05 DIAGNOSIS — N189 Chronic kidney disease, unspecified: Secondary | ICD-10-CM | POA: Diagnosis not present

## 2016-01-05 DIAGNOSIS — Z887 Allergy status to serum and vaccine status: Secondary | ICD-10-CM | POA: Insufficient documentation

## 2016-01-05 DIAGNOSIS — Z833 Family history of diabetes mellitus: Secondary | ICD-10-CM | POA: Insufficient documentation

## 2016-01-05 DIAGNOSIS — Z1211 Encounter for screening for malignant neoplasm of colon: Secondary | ICD-10-CM | POA: Insufficient documentation

## 2016-01-05 DIAGNOSIS — Z818 Family history of other mental and behavioral disorders: Secondary | ICD-10-CM | POA: Insufficient documentation

## 2016-01-05 DIAGNOSIS — Z950 Presence of cardiac pacemaker: Secondary | ICD-10-CM | POA: Diagnosis not present

## 2016-01-05 DIAGNOSIS — Z881 Allergy status to other antibiotic agents status: Secondary | ICD-10-CM | POA: Insufficient documentation

## 2016-01-05 DIAGNOSIS — E785 Hyperlipidemia, unspecified: Secondary | ICD-10-CM | POA: Insufficient documentation

## 2016-01-05 DIAGNOSIS — I2510 Atherosclerotic heart disease of native coronary artery without angina pectoris: Secondary | ICD-10-CM | POA: Insufficient documentation

## 2016-01-05 DIAGNOSIS — Z8639 Personal history of other endocrine, nutritional and metabolic disease: Secondary | ICD-10-CM | POA: Insufficient documentation

## 2016-01-05 DIAGNOSIS — Z90710 Acquired absence of both cervix and uterus: Secondary | ICD-10-CM | POA: Insufficient documentation

## 2016-01-05 DIAGNOSIS — I495 Sick sinus syndrome: Secondary | ICD-10-CM | POA: Insufficient documentation

## 2016-01-05 DIAGNOSIS — M1991 Primary osteoarthritis, unspecified site: Secondary | ICD-10-CM | POA: Insufficient documentation

## 2016-01-05 DIAGNOSIS — Z880 Allergy status to penicillin: Secondary | ICD-10-CM | POA: Diagnosis not present

## 2016-01-05 DIAGNOSIS — Z91013 Allergy to seafood: Secondary | ICD-10-CM | POA: Diagnosis not present

## 2016-01-05 HISTORY — PX: COLONOSCOPY WITH PROPOFOL: SHX5780

## 2016-01-05 SURGERY — COLONOSCOPY WITH PROPOFOL
Anesthesia: General

## 2016-01-05 MED ORDER — SODIUM CHLORIDE 0.9 % IV SOLN
INTRAVENOUS | Status: DC
  Administered 2016-01-05: 11:00:00 via INTRAVENOUS

## 2016-01-05 MED ORDER — PROPOFOL 500 MG/50ML IV EMUL
INTRAVENOUS | Status: DC | PRN
  Administered 2016-01-05: 100 ug/kg/min via INTRAVENOUS

## 2016-01-05 MED ORDER — SODIUM CHLORIDE 0.9 % IV SOLN: INTRAVENOUS | Status: DC

## 2016-01-05 NOTE — Op Note (Signed)
[facility] Gastroenterology Patient Name: Mary Webb Procedure Date: 01/05/2016 10:48 AM MRN: [id number] Account #: [id number] Date of Birth: 22-Jul-1956 Admit Type: Outpatient Age: 60 Room: [facility] ENDO ROOM 4 Gender: Female Note Status: Finalized Procedure:            Colonoscopy Indications:          Screening for colorectal malignant neoplasm Providers:            Lucilla Lame, MD Referring MD:         Guadalupe Maple, MD (Referring MD) Medicines:            Propofol per Anesthesia Complications:        No immediate complications. Procedure:            Pre-Anesthesia Assessment:                       - Prior to the procedure, a History and Physical was                        performed, and patient medications and allergies were                        reviewed. The patient's tolerance of previous                        anesthesia was also reviewed. The risks and benefits of                        the procedure and the sedation options and risks were                        discussed with the patient. All questions were                        answered, and informed consent was obtained. Prior                        Anticoagulants: The patient has taken no previous                        anticoagulant or antiplatelet agents. ASA Grade                        Assessment: III - A patient with severe systemic                        disease. After reviewing the risks and benefits, the                        patient was deemed in satisfactory condition to undergo                        the procedure.                       After obtaining informed consent, the colonoscope was                        passed under direct vision. Throughout the procedure,  the patient's blood pressure, pulse, and oxygen                        saturations were monitored continuously. The Olympus                        CF-H180AL colonoscope ( S#: [id number] ) was introduced                         through the anus and advanced to the the cecum,                        identified by appendiceal orifice and ileocecal valve.                        The colonoscopy was performed without difficulty. The                        patient tolerated the procedure well. The quality of                        the bowel preparation was excellent. Findings:      The perianal and digital rectal examinations were normal.      Non-bleeding internal hemorrhoids were found during retroflexion. The       hemorrhoids were Grade II (internal hemorrhoids that prolapse but reduce       spontaneously). Impression:           - Non-bleeding internal hemorrhoids.                       - No specimens collected. Recommendation:       - Repeat colonoscopy in 10 years for screening unless                        any change in family history or lower GI problems. Procedure Code(s):    --- Professional ---                       [phone], Colonoscopy, flexible; diagnostic, including                        collection of specimen(s) by brushing or washing, when                        performed (separate procedure) Diagnosis Code(s):    --- Professional ---                       Z12.11, Encounter for screening for malignant neoplasm                        of colon CPT copyright 2016 American [employer]. All rights reserved. The codes documented in this report are preliminary and upon coder review may  be revised to meet current compliance requirements. Lucilla Lame, MD 01/05/2016 11:14:51 AM This report has been signed electronically. Number of Addenda: 0 Note Initiated On: 01/05/2016 10:48 AM Scope Withdrawal Time: 0 hours 6 minutes 42 seconds  Total Procedure Duration: 0 hours 8 minutes 43 seconds       [facility]

## 2016-01-05 NOTE — Anesthesia Postprocedure Evaluation (Signed)
Anesthesia Post Note  Patient: Mary Webb  Procedure(s) Performed: Procedure(s) (LRB): COLONOSCOPY WITH PROPOFOL (N/A)  Patient location during evaluation: Endoscopy Anesthesia Type: General Level of consciousness: awake and alert Pain management: pain level controlled Vital Signs Assessment: post-procedure vital signs reviewed and stable Respiratory status: spontaneous breathing, nonlabored ventilation, respiratory function stable and patient connected to nasal cannula oxygen Cardiovascular status: blood pressure returned to baseline and stable Postop Assessment: no signs of nausea or vomiting Anesthetic complications: no    Last Vitals:  Filed Vitals:   01/05/16 1140 01/05/16 1150  BP: 125/68 115/68  Pulse:  66  Temp:    Resp:  15    Last Pain: There were no vitals filed for this visit.               Precious Haws Piscitello

## 2016-01-05 NOTE — Transfer of Care (Signed)
Immediate Anesthesia Transfer of Care Note  Patient: Mary Webb  Procedure(s) Performed: Procedure(s): COLONOSCOPY WITH PROPOFOL (N/A)  Patient Location: PACU  Anesthesia Type:General  Level of Consciousness: awake, alert  and oriented  Airway & Oxygen Therapy: Patient Spontanous Breathing and Patient connected to nasal cannula oxygen  Post-op Assessment: Report given to RN and Post -op Vital signs reviewed and stable  Post vital signs: Reviewed and stable  Last Vitals:  Filed Vitals:   01/05/16 1014  BP: 132/79  Pulse: 99  Temp: 36 C  Resp: 17    Complications: No apparent anesthesia complications

## 2016-01-05 NOTE — H&P (Signed)
[facility]  [address] [place], [place] [address] Phone: [phone] Fax : [phone]  Primary Care Physician:  Golden Pop, MD Primary Gastroenterologist:  Dr. Allen Norris  Pre-Procedure History & Physical: HPI:  Mary Webb is a 60 y.o. female is here for a screening colonoscopy.   Past Medical History  Diagnosis Date  . Lymphadenopathy   . Pituitary adenoma ([place])   . CAD (coronary artery disease)   . Hypertension   . Hyperlipidemia   . CVA (cerebral infarction)     L sided  . CKD (chronic kidney disease)   . Arthritis   . Recurrent depression ([place])   . Sick sinus syndrome ([place])   . Dysrhythmia   . Presence of permanent cardiac pacemaker   . Stroke ([place])   . Shortness of breath dyspnea     Past Surgical History  Procedure Laterality Date  . Abdominal hysterectomy    . Pacemaker insertion  2015    Dr. Josefa Half    Prior to Admission medications   Medication Sig Start Date End Date Taking? Authorizing Provider  chlorthalidone (HYGROTON) 25 MG tablet Take 1 tablet (25 mg total) by mouth daily. 10/26/15  Yes Guadalupe Maple, MD  losartan (COZAAR) 100 MG tablet Take 1 tablet (100 mg total) by mouth daily. 10/26/15  Yes Guadalupe Maple, MD  potassium chloride (KLOR-CON) 20 MEQ packet Take 20 mEq by mouth 2 (two) times daily. 10/26/15  Yes Guadalupe Maple, MD  Ascorbic Acid (VITAMIN C) 100 MG tablet Take 100 mg by mouth daily.    Historical Provider, MD  cholecalciferol (VITAMIN D) 400 UNITS TABS tablet Take 400 Units by mouth.    Historical Provider, MD  clopidogrel (PLAVIX) 75 MG tablet Take 1 tablet (75 mg total) by mouth daily. 10/26/15   Guadalupe Maple, MD  pyridOXINE (VITAMIN B-6) 25 MG tablet Take by mouth.    Historical Provider, MD    Allergies as of 12/04/2015 - Review Complete 12/03/2015  Allergen Reaction Noted  . Levaquin [levofloxacin] Other (See Comments) 03/13/2015  . Tekturna [aliskiren] Shortness Of Breath 03/13/2015  .  Accupril [quinapril hcl]  03/13/2015  . Aspirin  03/13/2015  . Diovan [valsartan]  03/13/2015  . Erythromycin  03/13/2015  . Iodine  03/13/2015  . Latex  11/25/2015  . Lotensin [benazepril]  03/13/2015  . Norvasc [amlodipine]  03/13/2015  . Shellfish allergy Swelling 11/25/2015  . Sulfa antibiotics  03/13/2015  . Tetanus toxoids  03/13/2015  . Tetracyclines & related  03/13/2015  . Penicillins Rash 03/13/2015    Family History  Problem Relation Age of Onset  . Hypertension Mother   . Diabetes Mother     under control  . Glaucoma Mother   . Hypertension Father   . Diabetes Father     under control  . Hypertension Maternal Grandmother   . Depression Maternal Grandmother   . Hypertension Maternal Grandfather   . Hypertension Paternal Grandmother   . Hypertension Paternal Grandfather   . Thyroid disease Maternal Aunt     Social History   Social History  . Marital Status: Married    Spouse Name: N/A  . Number of Children: N/A  . Years of Education: N/A   Occupational History  . Not on file.   Social History Main Topics  . Smoking status: Never Smoker   . Smokeless tobacco: Never Used  . Alcohol Use: No  . Drug Use: No  . Sexual Activity: Not on file  Other Topics Concern  . Not on file   Social History Narrative    Review of Systems: See HPI, otherwise negative ROS  Physical Exam: BP 132/79 mmHg  Pulse 99  Temp(Src) 96.8 F (36 C) (Tympanic)  Resp 17  Ht 5\' 2"  (1.575 m)  Wt 367 lb (166.47 kg)  BMI 67.11 kg/m2  SpO2 96% General:   Alert,  pleasant and cooperative in NAD Head:  Normocephalic and atraumatic. Neck:  Supple; no masses or thyromegaly. Lungs:  Clear throughout to auscultation.    Heart:  Regular rate and rhythm. Abdomen:  Soft, nontender and nondistended. Normal bowel sounds, without guarding, and without rebound.   Neurologic:  Alert and  oriented x4;  grossly normal neurologically.  Impression/Plan: Mary Webb is now here to  undergo a screening colonoscopy.  Risks, benefits, and alternatives regarding colonoscopy have been reviewed with the patient.  Questions have been answered.  All parties agreeable.

## 2016-01-05 NOTE — Anesthesia Preprocedure Evaluation (Signed)
Anesthesia Evaluation  Patient identified by MRN, date of birth, ID band Patient awake    Reviewed: Allergy & Precautions, H&P , NPO status , Patient's Chart, lab work & pertinent test results  History of Anesthesia Complications (+) DIFFICULT IV STICK / SPECIAL LINE and history of anesthetic complications  Airway Mallampati: III  TM Distance: >3 FB Neck ROM: limited    Dental  (+) Poor Dentition   Pulmonary shortness of breath,    Pulmonary exam normal breath sounds clear to auscultation       Cardiovascular Exercise Tolerance: Poor hypertension, (-) angina+ CAD and + DOE  (-) Past MI Normal cardiovascular exam+ dysrhythmias + pacemaker  Rhythm:regular Rate:Normal     Neuro/Psych PSYCHIATRIC DISORDERS Depression CVA, Residual Symptoms    GI/Hepatic negative GI ROS, Neg liver ROS,   Endo/Other  negative endocrine ROS  Renal/GU Renal disease  negative genitourinary   Musculoskeletal  (+) Arthritis ,   Abdominal   Peds  Hematology negative hematology ROS (+)   Anesthesia Other Findings Past Medical History:   Lymphadenopathy                                              Pituitary adenoma (HCC)                                      CAD (coronary artery disease)                                Hypertension                                                 Hyperlipidemia                                               CVA (cerebral infarction)                                      Comment:L sided   CKD (chronic kidney disease)                                 Arthritis                                                    Recurrent depression (HCC)                                   Sick sinus syndrome ([place])  Dysrhythmia                                                  Presence of permanent cardiac pacemaker                      Stroke ([place])                                                Shortness of breath dyspnea                                 Past Surgical History:   ABDOMINAL HYSTERECTOMY                                        PACEMAKER INSERTION                              2015           Comment:Dr. Parachos  BMI    Body Mass Index   67.10 kg/m 2 Super Morbid Obesity     Reproductive/Obstetrics negative OB ROS                             Anesthesia Physical Anesthesia Plan  ASA: IV  Anesthesia Plan: General   Post-op Pain Management:    Induction:   Airway Management Planned:   Additional Equipment:   Intra-op Plan:   Post-operative Plan:   Informed Consent: I have reviewed the patients History and Physical, chart, labs and discussed the procedure including the risks, benefits and alternatives for the proposed anesthesia with the patient or authorized representative who has indicated his/her understanding and acceptance.   Dental Advisory Given  Plan Discussed with: Anesthesiologist, CRNA and Surgeon  Anesthesia Plan Comments:         Anesthesia Quick Evaluation

## 2016-01-06 ENCOUNTER — Encounter: Payer: Self-pay | Admitting: Gastroenterology

## 2016-01-20 ENCOUNTER — Telehealth: Payer: Self-pay | Admitting: Family Medicine

## 2016-01-20 NOTE — Telephone Encounter (Signed)
Pt called and stated that she has what she thinks may be a cyst on her left breast and she would like an order put in for a mammogram.

## 2016-01-20 NOTE — Telephone Encounter (Signed)
Call pt 

## 2016-01-21 NOTE — Telephone Encounter (Signed)
Patient coming in for appointment on Monday

## 2016-01-25 ENCOUNTER — Encounter: Payer: Self-pay | Admitting: Family Medicine

## 2016-01-25 ENCOUNTER — Ambulatory Visit (INDEPENDENT_AMBULATORY_CARE_PROVIDER_SITE_OTHER): Payer: Medicare Other | Admitting: Family Medicine

## 2016-01-25 VITALS — BP 155/83 | HR 99 | Temp 97.9°F | Ht 62.1 in | Wt 373.6 lb

## 2016-01-25 DIAGNOSIS — N183 Chronic kidney disease, stage 3 unspecified: Secondary | ICD-10-CM

## 2016-01-25 DIAGNOSIS — N611 Abscess of the breast and nipple: Secondary | ICD-10-CM | POA: Diagnosis not present

## 2016-01-25 DIAGNOSIS — I129 Hypertensive chronic kidney disease with stage 1 through stage 4 chronic kidney disease, or unspecified chronic kidney disease: Secondary | ICD-10-CM

## 2016-01-25 NOTE — Progress Notes (Signed)
BP 155/83 mmHg  Pulse 99  Temp(Src) 97.9 F (36.6 C)  Ht 5' 2.1" (1.577 m)  Wt 373 lb 9.6 oz (169.464 kg)  BMI 68.14 kg/m2  SpO2 97%   Subjective:    Patient ID: Mary Webb, female    DOB: 04/25/1956, 60 y.o.   MRN: [id number]  HPI: Mary Webb is a 60 y.o. female  Chief Complaint  Patient presents with  . Cyst    pt states she has knot/cyst on her left breast. States she noticed it about 2-3 weeks ago. States it is almost like a blood blister, states there was no drainage   discuss hypertension patient taking blood pressure medicines but worried about procedure today will check blood pressure again later this week and review [place] assess blood pressure Areas red indurated proximal 3 cm across area was prepped with Betadine and alcohol and infiltrated with Xylocaine incision and drainage made with purulent material drained and culture performed. Blood and pus drained wick was inserted patient education given on wound care We'll follow up tomorrow for wick change.  Relevant past medical, surgical, family and social history reviewed and updated as indicated. Interim medical history since our last visit reviewed. Allergies and medications reviewed and updated.  Review of Systems  Constitutional: Negative.  Negative for fever and fatigue.  Respiratory: Negative.   Cardiovascular: Negative.     Per HPI unless specifically indicated above     Objective:    BP 155/83 mmHg  Pulse 99  Temp(Src) 97.9 F (36.6 C)  Ht 5' 2.1" (1.577 m)  Wt 373 lb 9.6 oz (169.464 kg)  BMI 68.14 kg/m2  SpO2 97%  Wt Readings from Last 3 Encounters:  01/25/16 373 lb 9.6 oz (169.464 kg)  01/05/16 367 lb (166.47 kg)  12/03/15 371 lb (168.284 kg)    Physical Exam  Constitutional: She is oriented to person, place, and time. She appears well-developed and well-nourished. No distress.  HENT:  Head: Normocephalic and atraumatic.  Right Ear: Hearing normal.  Left Ear: Hearing normal.   Nose: Nose normal.  Eyes: Conjunctivae and lids are normal. Right eye exhibits no discharge. Left eye exhibits no discharge. No scleral icterus.  Pulmonary/Chest: Effort normal. No respiratory distress.  Musculoskeletal: Normal range of motion.  Neurological: She is alert and oriented to person, place, and time.  Skin: Skin is intact. No rash noted.  See procedure note above  Psychiatric: She has a normal mood and affect. Her speech is normal and behavior is normal. Judgment and thought content normal. Cognition and memory are normal.    Results for orders placed or performed in visit on 10/26/15  Microscopic Examination  Result Value Ref Range   WBC, UA 0-5 0 -  5 /hpf   RBC, UA 0-2 0 -  2 /hpf   Epithelial Cells (non renal) 0-10 0 - 10 /hpf   Bacteria, UA Few None seen/Few  Comprehensive metabolic panel  Result Value Ref Range   Glucose 105 (H) 65 - 99 mg/dL   BUN 15 8 - 27 mg/dL   Creatinine, Ser 1.21 (H) 0.57 - 1.00 mg/dL   GFR calc non Af Amer 49 (L) >59 mL/min/1.73   GFR calc Af Amer 56 (L) >59 mL/min/1.73   BUN/Creatinine Ratio 12 11 - 26   Sodium 139 134 - 144 mmol/L   Potassium 3.9 3.5 - 5.2 mmol/L   Chloride 98 96 - 106 mmol/L   CO2 27 18 - 29 mmol/L  Calcium 9.4 8.7 - 10.3 mg/dL   Total Protein 7.1 6.0 - 8.5 g/dL   Albumin 4.0 3.6 - 4.8 g/dL   Globulin, Total 3.1 1.5 - 4.5 g/dL   Albumin/Globulin Ratio 1.3 1.1 - 2.5   Bilirubin Total 0.4 0.0 - 1.2 mg/dL   Alkaline Phosphatase 76 39 - 117 IU/L   AST 14 0 - 40 IU/L   ALT 18 0 - 32 IU/L  Lipid panel  Result Value Ref Range   Cholesterol, Total 153 100 - 199 mg/dL   Triglycerides 89 0 - 149 mg/dL   HDL 42 >39 mg/dL   VLDL Cholesterol Cal 18 5 - 40 mg/dL   LDL Calculated 93 0 - 99 mg/dL   Chol/HDL Ratio 3.6 0.0 - 4.4 ratio units  CBC with Differential/Platelet  Result Value Ref Range   WBC 6.9 3.4 - 10.8 x10E3/uL   RBC 4.84 3.77 - 5.28 x10E6/uL   Hemoglobin 14.2 11.1 - 15.9 g/dL   Hematocrit 42.7 34.0 -  46.6 %   MCV 88 79 - 97 fL   MCH 29.3 26.6 - 33.0 pg   MCHC 33.3 31.5 - 35.7 g/dL   RDW 14.8 12.3 - 15.4 %   Platelets 295 150 - 379 x10E3/uL   Neutrophils 55 %   Lymphs 32 %   Monocytes 11 %   Eos 2 %   Basos 0 %   Neutrophils Absolute 3.8 1.4 - 7.0 x10E3/uL   Lymphocytes Absolute 2.2 0.7 - 3.1 x10E3/uL   Monocytes Absolute 0.7 0.1 - 0.9 x10E3/uL   EOS (ABSOLUTE) 0.1 0.0 - 0.4 x10E3/uL   Basophils Absolute 0.0 0.0 - 0.2 x10E3/uL   Immature Granulocytes 0 %   Immature Grans (Abs) 0.0 0.0 - 0.1 x10E3/uL  Urinalysis, Routine w reflex microscopic (not at [facility])  Result Value Ref Range   Specific Gravity, UA 1.020 1.005 - 1.030   pH, UA 7.5 5.0 - 7.5   Color, UA Yellow Yellow   Appearance Ur Clear Clear   Leukocytes, UA Trace (A) Negative   Protein, UA Negative Negative/Trace   Glucose, UA Negative Negative   Ketones, UA Negative Negative   RBC, UA Negative Negative   Bilirubin, UA Negative Negative   Urobilinogen, Ur 1.0 0.2 - 1.0 mg/dL   Nitrite, UA Negative Negative   Microscopic Examination See below:   TSH  Result Value Ref Range   TSH 1.860 0.450 - 4.500 uIU/mL      Assessment & Plan:   Problem List Items Addressed This Visit      Genitourinary   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

## 2016-01-25 NOTE — Addendum Note (Signed)
Addended byGolden Pop on: 01/25/2016 11:49 AM   Modules accepted: Orders

## 2016-01-25 NOTE — Assessment & Plan Note (Signed)
Poor control may be transiently elevated will assess later this week to further office visits for wick removal

## 2016-01-26 ENCOUNTER — Encounter: Payer: Self-pay | Admitting: Family Medicine

## 2016-01-26 ENCOUNTER — Ambulatory Visit: Payer: Medicare Other | Admitting: Family Medicine

## 2016-01-26 VITALS — BP 119/81 | HR 89 | Temp 97.7°F

## 2016-01-26 DIAGNOSIS — N611 Abscess of the breast and nipple: Secondary | ICD-10-CM | POA: Insufficient documentation

## 2016-01-26 NOTE — Progress Notes (Signed)
Patient recheck breast abscess feeling better wound drained and less pain. Wound inspected Elza Rafter removed revealing wound which is clearing less redness less induration Elza Rafter replaced Will recheck tomorrow

## 2016-01-27 ENCOUNTER — Ambulatory Visit: Payer: Medicare Other | Admitting: Family Medicine

## 2016-01-27 ENCOUNTER — Encounter: Payer: Self-pay | Admitting: Family Medicine

## 2016-01-27 VITALS — BP 131/82 | HR 93 | Temp 97.8°F

## 2016-01-27 DIAGNOSIS — N611 Abscess of the breast and nipple: Secondary | ICD-10-CM

## 2016-01-27 LAB — WOUND CULTURE: Organism ID, Bacteria: NONE SEEN

## 2016-01-27 NOTE — Progress Notes (Signed)
Wick change patient doing well has drained some wound clearing practically no induration still pus drainage Mary Webb removed wound cleaned Wick readings inserted will check again tomorrow for [employer] replacement.

## 2016-01-28 ENCOUNTER — Ambulatory Visit (INDEPENDENT_AMBULATORY_CARE_PROVIDER_SITE_OTHER): Payer: Medicare Other | Admitting: Family Medicine

## 2016-01-28 ENCOUNTER — Encounter: Payer: Self-pay | Admitting: Family Medicine

## 2016-01-28 VITALS — BP 114/79 | HR 87 | Temp 98.6°F

## 2016-01-28 DIAGNOSIS — N611 Abscess of the breast and nipple: Secondary | ICD-10-CM

## 2016-01-28 NOTE — Progress Notes (Signed)
Removed from wound still draining nicely induration and redness have resolved Mary Webb reinserted  will come back tomorrow probably last visit regarding this abscess

## 2016-01-29 ENCOUNTER — Encounter: Payer: Self-pay | Admitting: Family Medicine

## 2016-01-29 ENCOUNTER — Ambulatory Visit (INDEPENDENT_AMBULATORY_CARE_PROVIDER_SITE_OTHER): Payer: Medicare Other | Admitting: Family Medicine

## 2016-01-29 VITALS — BP 132/72 | Temp 98.5°F

## 2016-01-29 DIAGNOSIS — N611 Abscess of the breast and nipple: Secondary | ICD-10-CM

## 2016-01-29 NOTE — Progress Notes (Signed)
Mary Webb changed again today. No redness around the skin. Still some slight oozing of pus, nothing able to be expressed. Keep covered and monitor and recheck on Monday.

## 2016-02-01 ENCOUNTER — Ambulatory Visit: Payer: Medicare Other | Admitting: Family Medicine

## 2016-02-01 ENCOUNTER — Encounter: Payer: Self-pay | Admitting: Family Medicine

## 2016-02-01 VITALS — BP 122/78 | HR 83 | Temp 97.9°F

## 2016-02-01 DIAGNOSIS — N611 Abscess of the breast and nipple: Secondary | ICD-10-CM

## 2016-02-01 NOTE — Progress Notes (Signed)
Wound recheck Mary Webb removed wound healing well granulating in with very little drainage will not put a wick back and patient had on wound care recheck if problems

## 2016-04-12 ENCOUNTER — Ambulatory Visit: Payer: Medicare Other

## 2016-04-13 ENCOUNTER — Ambulatory Visit
Admission: RE | Admit: 2016-04-13 | Discharge: 2016-04-13 | Disposition: A | Discharge status: Home / Self Care | Payer: Medicare Other | Source: Ambulatory Visit | Attending: Family Medicine | Admitting: Family Medicine

## 2016-04-13 DIAGNOSIS — Z1231 Encounter for screening mammogram for malignant neoplasm of breast: Secondary | ICD-10-CM | POA: Insufficient documentation

## 2016-04-13 DIAGNOSIS — Z1239 Encounter for other screening for malignant neoplasm of breast: Secondary | ICD-10-CM

## 2016-04-13 LAB — HM MAMMOGRAPHY

## 2016-04-25 ENCOUNTER — Ambulatory Visit: Payer: Medicare Other | Admitting: Family Medicine

## 2016-05-04 ENCOUNTER — Encounter: Payer: Self-pay | Admitting: Family Medicine

## 2016-05-04 ENCOUNTER — Ambulatory Visit (INDEPENDENT_AMBULATORY_CARE_PROVIDER_SITE_OTHER): Payer: Medicare Other | Admitting: Family Medicine

## 2016-05-04 VITALS — BP 122/85 | HR 65 | Temp 98.1°F | Ht 63.0 in | Wt 380.0 lb

## 2016-05-04 DIAGNOSIS — N183 Chronic kidney disease, stage 3 unspecified: Secondary | ICD-10-CM

## 2016-05-04 DIAGNOSIS — E785 Hyperlipidemia, unspecified: Secondary | ICD-10-CM

## 2016-05-04 DIAGNOSIS — I2510 Atherosclerotic heart disease of native coronary artery without angina pectoris: Secondary | ICD-10-CM | POA: Diagnosis not present

## 2016-05-04 DIAGNOSIS — I129 Hypertensive chronic kidney disease with stage 1 through stage 4 chronic kidney disease, or unspecified chronic kidney disease: Secondary | ICD-10-CM | POA: Diagnosis not present

## 2016-05-04 NOTE — Assessment & Plan Note (Signed)
The current medical regimen is effective;  continue present plan and medications.  

## 2016-05-04 NOTE — Progress Notes (Signed)
   BP 122/85 mmHg  Pulse 65  Temp(Src) 98.1 F (36.7 C)  Ht 5\' 3"  (1.6 m)  Wt 380 lb (172.367 kg)  BMI 67.33 kg/m2  SpO2 98%   Subjective:    Patient ID: Mary Webb, female    DOB: 06/18/1956, 60 y.o.   MRN: [id number]  HPI: Mary Webb is a 60 y.o. female  Chief Complaint  Patient presents with  . Hypertension  . please review mammogram results  Patient recheck hypertension doing well no complaints from medications taken faithfully without problems and good blood pressure control. Taking Plavix without problems no cardiac or renal conditions or concerns. Patient has gained some weight developed some leg edema and has some discoloration of skin especially around left ankle.  Relevant past medical, surgical, family and social history reviewed and updated as indicated. Interim medical history since our last visit reviewed. Allergies and medications reviewed and updated.  Review of Systems  Constitutional: Negative.   Respiratory: Negative.   Cardiovascular: Negative.     Per HPI unless specifically indicated above     Objective:    BP 122/85 mmHg  Pulse 65  Temp(Src) 98.1 F (36.7 C)  Ht 5\' 3"  (1.6 m)  Wt 380 lb (172.367 kg)  BMI 67.33 kg/m2  SpO2 98%  Wt Readings from Last 3 Encounters:  05/04/16 380 lb (172.367 kg)  01/25/16 373 lb 9.6 oz (169.464 kg)  01/05/16 367 lb (166.47 kg)    Physical Exam  Constitutional: She is oriented to person, place, and time. She appears well-developed and well-nourished. No distress.  HENT:  Head: Normocephalic and atraumatic.  Right Ear: Hearing normal.  Left Ear: Hearing normal.  Nose: Nose normal.  Eyes: Conjunctivae and lids are normal. Right eye exhibits no discharge. Left eye exhibits no discharge. No scleral icterus.  Cardiovascular: Normal rate, regular rhythm and normal heart sounds.   Pulmonary/Chest: Effort normal and breath sounds normal. No respiratory distress.  Musculoskeletal: Normal range of  motion.  Venous stasis and edema changes of both legs  Neurological: She is alert and oriented to person, place, and time.  Skin: Skin is intact. No rash noted.  Psychiatric: She has a normal mood and affect. Her speech is normal and behavior is normal. Judgment and thought content normal. Cognition and memory are normal.        Assessment & Plan:   Problem List Items Addressed This Visit      Cardiovascular and Mediastinum   CAD (coronary artery disease)     Genitourinary   Hypertensive kidney disease with CKD stage III - Primary    The current medical regimen is effective;  continue present plan and medications.       Relevant Orders   Basic metabolic panel     Other   Hyperlipidemia (Chronic)    The current medical regimen is effective;  continue present plan and medications.           Follow up plan: Return in about 6 months (around 11/03/2016) for Physical Exam.

## 2016-05-05 ENCOUNTER — Encounter: Payer: Self-pay | Admitting: Family Medicine

## 2016-05-05 LAB — BASIC METABOLIC PANEL
BUN / CREAT RATIO: 13 (ref 12–28)
BUN: 14 mg/dL (ref 8–27)
CALCIUM: 9.1 mg/dL (ref 8.7–10.3)
CHLORIDE: 97 mmol/L (ref 96–106)
CO2: 23 mmol/L (ref 18–29)
Creatinine, Ser: 1.08 mg/dL — ABNORMAL HIGH (ref 0.57–1.00)
GFR calc Af Amer: 64 mL/min/{1.73_m2} (ref >59)
GFR calc non Af Amer: 56 mL/min/{1.73_m2} — ABNORMAL LOW (ref >59)
GLUCOSE: 94 mg/dL (ref 65–99)
POTASSIUM: 3.9 mmol/L (ref 3.5–5.2)
SODIUM: 138 mmol/L (ref 134–144)

## 2016-06-10 ENCOUNTER — Telehealth: Payer: Self-pay

## 2016-06-10 NOTE — Telephone Encounter (Signed)
Patient called requesting an Rx for a walker with seat  Will call back to let her know she must have face-to-face visit for documentation for Medicare  Spoke with patient, she will address at her appointment in December.

## 2016-06-21 DIAGNOSIS — I495 Sick sinus syndrome: Secondary | ICD-10-CM | POA: Diagnosis not present

## 2016-09-27 DIAGNOSIS — H40013 Open angle with borderline findings, low risk, bilateral: Secondary | ICD-10-CM | POA: Diagnosis not present

## 2016-09-27 DIAGNOSIS — D352 Benign neoplasm of pituitary gland: Secondary | ICD-10-CM | POA: Diagnosis not present

## 2016-10-26 ENCOUNTER — Ambulatory Visit (INDEPENDENT_AMBULATORY_CARE_PROVIDER_SITE_OTHER): Payer: Medicare Other | Admitting: Family Medicine

## 2016-10-26 ENCOUNTER — Encounter: Payer: Self-pay | Admitting: Family Medicine

## 2016-10-26 VITALS — BP 122/69 | HR 61 | Temp 98.0°F | Ht 62.5 in | Wt 356.4 lb

## 2016-10-26 DIAGNOSIS — I6322 Cerebral infarction due to unspecified occlusion or stenosis of basilar arteries: Secondary | ICD-10-CM

## 2016-10-26 DIAGNOSIS — I129 Hypertensive chronic kidney disease with stage 1 through stage 4 chronic kidney disease, or unspecified chronic kidney disease: Secondary | ICD-10-CM

## 2016-10-26 DIAGNOSIS — Z6841 Body Mass Index (BMI) 40.0 and over, adult: Secondary | ICD-10-CM

## 2016-10-26 DIAGNOSIS — Z0000 Encounter for general adult medical examination without abnormal findings: Secondary | ICD-10-CM

## 2016-10-26 DIAGNOSIS — E7800 Pure hypercholesterolemia, unspecified: Secondary | ICD-10-CM

## 2016-10-26 DIAGNOSIS — I495 Sick sinus syndrome: Secondary | ICD-10-CM | POA: Diagnosis not present

## 2016-10-26 DIAGNOSIS — I2510 Atherosclerotic heart disease of native coronary artery without angina pectoris: Secondary | ICD-10-CM

## 2016-10-26 DIAGNOSIS — I10 Essential (primary) hypertension: Secondary | ICD-10-CM | POA: Diagnosis not present

## 2016-10-26 DIAGNOSIS — N183 Chronic kidney disease, stage 3 unspecified: Secondary | ICD-10-CM

## 2016-10-26 DIAGNOSIS — E6609 Other obesity due to excess calories: Secondary | ICD-10-CM

## 2016-10-26 LAB — URINALYSIS, ROUTINE W REFLEX MICROSCOPIC
BILIRUBIN UA: NEGATIVE
Glucose, UA: NEGATIVE
Ketones, UA: NEGATIVE
Leukocytes, UA: NEGATIVE
NITRITE UA: NEGATIVE
PH UA: 7 (ref 5.0–7.5)
Protein, UA: NEGATIVE
RBC, UA: NEGATIVE
Specific Gravity, UA: 1.015 (ref 1.005–1.030)
UUROB: 1 mg/dL (ref 0.2–1.0)

## 2016-10-26 MED ORDER — LOSARTAN POTASSIUM 100 MG PO TABS: 100.0000 mg | ORAL_TABLET | Freq: Every day | ORAL | 4 refills | Status: DC

## 2016-10-26 MED ORDER — CHLORTHALIDONE 25 MG PO TABS: 25.0000 mg | ORAL_TABLET | Freq: Every day | ORAL | 4 refills | Status: DC

## 2016-10-26 MED ORDER — POTASSIUM CHLORIDE 20 MEQ PO PACK: 20.0000 meq | PACK | Freq: Two times a day (BID) | ORAL | 4 refills | Status: DC

## 2016-10-26 MED ORDER — CLOPIDOGREL BISULFATE 75 MG PO TABS: 75.0000 mg | ORAL_TABLET | Freq: Every day | ORAL | 4 refills | Status: DC

## 2016-10-26 NOTE — Assessment & Plan Note (Signed)
Patient with residual left leg dragging which results in tripping and patient has fallen several times this year. Patient needs to hold on her touch. We have recommended a rolling walker with seat. I will write a prescription today to start the paperwork cascade

## 2016-10-26 NOTE — Assessment & Plan Note (Signed)
Discussed wt loss diet etc 

## 2016-10-26 NOTE — Progress Notes (Signed)
BP 122/69 (BP Location: Left Arm, Patient Position: Sitting, Cuff Size: Large)   Pulse 61   Temp 98 F (36.7 C)   Ht 5' 2.5" (1.588 m)   Wt (!) 356 lb 6.4 oz (161.7 kg)   SpO2 99%   BMI 64.15 kg/m    Subjective:    Patient ID: Mary Webb, female    DOB: 1956-10-07, 60 y.o.   MRN: [id number]  HPI: Mary Webb is a 60 y.o. female  Chief Complaint  Patient presents with  . Annual Exam  AWV metrics met  Patient follow-up doing okay status post stroke and medical conditions doing well with blood pressure and Plavix. Patient's biggest problem is falling with left-sided residual weakness and dragging of her left foot status post stroke. Patient needs to hold on to something that she walks. On review of walk or would be her best bet with a seat.  Relevant past medical, surgical, family and social history reviewed and updated as indicated. Interim medical history since our last visit reviewed. Allergies and medications reviewed and updated.  Review of Systems  Constitutional: Negative.   HENT: Negative.   Eyes: Negative.   Respiratory: Negative.   Cardiovascular: Negative.   Gastrointestinal: Negative.   Endocrine: Negative.   Genitourinary: Negative.   Musculoskeletal: Negative.   Skin: Negative.   Allergic/Immunologic: Negative.   Neurological: Negative.   Hematological: Negative.   Psychiatric/Behavioral: Negative.     Per HPI unless specifically indicated above     Objective:    BP 122/69 (BP Location: Left Arm, Patient Position: Sitting, Cuff Size: Large)   Pulse 61   Temp 98 F (36.7 C)   Ht 5' 2.5" (1.588 m)   Wt (!) 356 lb 6.4 oz (161.7 kg)   SpO2 99%   BMI 64.15 kg/m   Wt Readings from Last 3 Encounters:  10/26/16 (!) 356 lb 6.4 oz (161.7 kg)  05/04/16 (!) 380 lb (172.4 kg)  01/25/16 (!) 373 lb 9.6 oz (169.5 kg)    Physical Exam  Constitutional: She is oriented to person, place, and time. She appears well-developed and well-nourished.    HENT:  Head: Normocephalic and atraumatic.  Right Ear: External ear normal.  Left Ear: External ear normal.  Nose: Nose normal.  Mouth/Throat: Oropharynx is clear and moist.  Eyes: Conjunctivae and EOM are normal. Pupils are equal, round, and reactive to light.  Neck: Normal range of motion. Neck supple. Carotid bruit is not present.  Cardiovascular: Normal rate, regular rhythm and normal heart sounds.   No murmur heard. Pulmonary/Chest: Effort normal and breath sounds normal. She exhibits no mass. Right breast exhibits no mass, no skin change and no tenderness. Left breast exhibits no mass, no skin change and no tenderness. Breasts are symmetrical.  Abdominal: Soft. Bowel sounds are normal. There is no hepatosplenomegaly.  Musculoskeletal: Normal range of motion.  Neurological: She is alert and oriented to person, place, and time.  Skin: No rash noted.  Psychiatric: She has a normal mood and affect. Her behavior is normal. Judgment and thought content normal.    Results for orders placed or performed in visit on 05/31/16  HM MAMMOGRAPHY  Result Value Ref Range   HM Mammogram 0-4 Bi-Rad 0-4 Bi-Rad, Self Reported Normal      Assessment & Plan:   Problem List Items Addressed This Visit      Cardiovascular and Mediastinum   Sick sinus syndrome (HCC) (Chronic)   Relevant Medications   clopidogrel (PLAVIX) 75  MG tablet   losartan (COZAAR) 100 MG tablet   chlorthalidone (HYGROTON) 25 MG tablet   Other Relevant Orders   TSH   CAD (coronary artery disease)    The current medical regimen is effective;  continue present plan and medications.       Relevant Medications   losartan (COZAAR) 100 MG tablet   chlorthalidone (HYGROTON) 25 MG tablet   Other Relevant Orders   Comprehensive metabolic panel   CBC with Differential/Platelet   TSH     Nervous and Auditory   Cerebral infarction [facility])    Patient with residual left leg dragging which results in tripping and patient has  fallen several times this year. Patient needs to hold on her touch. We have recommended a rolling walker with seat. I will write a prescription today to start the paperwork cascade      Relevant Medications   clopidogrel (PLAVIX) 75 MG tablet   Other Relevant Orders   Comprehensive metabolic panel   CBC with Differential/Platelet   TSH     Genitourinary   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       Relevant Medications   potassium chloride (KLOR-CON) 20 MEQ packet   losartan (COZAAR) 100 MG tablet   chlorthalidone (HYGROTON) 25 MG tablet   Other Relevant Orders   Comprehensive metabolic panel   CBC with Differential/Platelet   TSH   Urinalysis, Routine w reflex microscopic     Other   BMI 60.0-69.9, adult (HCC) (Chronic)    Discussed wt loss diet etc      Hyperlipidemia (Chronic)    The current medical regimen is effective;  continue present plan and medications.       Relevant Medications   losartan (COZAAR) 100 MG tablet   chlorthalidone (HYGROTON) 25 MG tablet   Other Relevant Orders   Lipid panel    Other Visit Diagnoses    PE (physical exam), annual    -  Primary   Essential hypertension  (Chronic)      Relevant Medications   potassium chloride (KLOR-CON) 20 MEQ packet   losartan (COZAAR) 100 MG tablet   chlorthalidone (HYGROTON) 25 MG tablet       Follow up plan: Return in about 6 months (around 04/26/2017) for BMP,  Lipids, ALT, AST.

## 2016-10-26 NOTE — Assessment & Plan Note (Signed)
The current medical regimen is effective;  continue present plan and medications.  

## 2016-10-27 ENCOUNTER — Encounter: Payer: Self-pay | Admitting: Family Medicine

## 2016-10-27 LAB — CBC WITH DIFFERENTIAL/PLATELET
BASOS ABS: 0 10*3/uL (ref 0.0–0.2)
Basos: 1 %
EOS (ABSOLUTE): 0.1 10*3/uL (ref 0.0–0.4)
EOS: 2 %
HEMATOCRIT: 41.7 % (ref 34.0–46.6)
HEMOGLOBIN: 13.9 g/dL (ref 11.1–15.9)
IMMATURE GRANS (ABS): 0 10*3/uL (ref 0.0–0.1)
Immature Granulocytes: 0 %
LYMPHS ABS: 1.9 10*3/uL (ref 0.7–3.1)
LYMPHS: 29 %
MCH: 29.4 pg (ref 26.6–33.0)
MCHC: 33.3 g/dL (ref 31.5–35.7)
MCV: 88 fL (ref 79–97)
MONOCYTES: 10 %
Monocytes Absolute: 0.7 10*3/uL (ref 0.1–0.9)
NEUTROS ABS: 3.8 10*3/uL (ref 1.4–7.0)
Neutrophils: 58 %
Platelets: 278 10*3/uL (ref 150–379)
RBC: 4.73 x10E6/uL (ref 3.77–5.28)
RDW: 14.6 % (ref 12.3–15.4)
WBC: 6.5 10*3/uL (ref 3.4–10.8)

## 2016-10-27 LAB — COMPREHENSIVE METABOLIC PANEL
A/G RATIO: 1.3 (ref 1.2–2.2)
ALK PHOS: 76 IU/L (ref 39–117)
ALT: 15 IU/L (ref 0–32)
AST: 13 IU/L (ref 0–40)
Albumin: 4.3 g/dL (ref 3.6–4.8)
BILIRUBIN TOTAL: 0.4 mg/dL (ref 0.0–1.2)
BUN/Creatinine Ratio: 12 (ref 12–28)
BUN: 12 mg/dL (ref 8–27)
CHLORIDE: 98 mmol/L (ref 96–106)
CO2: 27 mmol/L (ref 18–29)
Calcium: 9.1 mg/dL (ref 8.7–10.3)
Creatinine, Ser: 1 mg/dL (ref 0.57–1.00)
GFR calc Af Amer: 70 mL/min/{1.73_m2} (ref >59)
GFR, EST NON AFRICAN AMERICAN: 61 mL/min/{1.73_m2} (ref >59)
GLOBULIN, TOTAL: 3.2 g/dL (ref 1.5–4.5)
Glucose: 108 mg/dL — ABNORMAL HIGH (ref 65–99)
POTASSIUM: 3.8 mmol/L (ref 3.5–5.2)
SODIUM: 140 mmol/L (ref 134–144)
Total Protein: 7.5 g/dL (ref 6.0–8.5)

## 2016-10-27 LAB — LIPID PANEL
CHOL/HDL RATIO: 3.5 ratio (ref 0.0–4.4)
CHOLESTEROL TOTAL: 163 mg/dL (ref 100–199)
HDL: 46 mg/dL (ref >39)
LDL Calculated: 97 mg/dL (ref 0–99)
TRIGLYCERIDES: 102 mg/dL (ref 0–149)
VLDL Cholesterol Cal: 20 mg/dL (ref 5–40)

## 2016-10-27 LAB — TSH: TSH: 2.26 u[IU]/mL (ref 0.450–4.500)

## 2016-12-20 DIAGNOSIS — I495 Sick sinus syndrome: Secondary | ICD-10-CM | POA: Diagnosis not present

## 2017-02-09 ENCOUNTER — Telehealth: Payer: Self-pay | Admitting: Family Medicine

## 2017-02-09 NOTE — Telephone Encounter (Signed)
Order faxed.

## 2017-02-09 NOTE — Telephone Encounter (Signed)
Routing to provider  

## 2017-02-09 NOTE — Telephone Encounter (Signed)
Order written and ready to be faxed

## 2017-02-09 NOTE — Telephone Encounter (Signed)
Dr Jeananne Rama had given the patient a script for a walker but the patient needs the script to say bariatric walker.   Patient called to see if Dr Jeananne Rama would fax new script with this on it to  [facility]  If any questions the patient can be reached at [phone]  Thanks

## 2017-02-10 DIAGNOSIS — Z7409 Other reduced mobility: Secondary | ICD-10-CM | POA: Diagnosis not present

## 2017-04-20 ENCOUNTER — Encounter: Payer: Self-pay | Admitting: Family Medicine

## 2017-05-02 ENCOUNTER — Ambulatory Visit: Payer: Medicare Other | Admitting: Family Medicine

## 2017-05-23 ENCOUNTER — Encounter: Payer: Self-pay | Admitting: Family Medicine

## 2017-05-23 ENCOUNTER — Ambulatory Visit (INDEPENDENT_AMBULATORY_CARE_PROVIDER_SITE_OTHER): Payer: Medicare Other | Admitting: Family Medicine

## 2017-05-23 DIAGNOSIS — E7800 Pure hypercholesterolemia, unspecified: Secondary | ICD-10-CM | POA: Diagnosis not present

## 2017-05-23 DIAGNOSIS — I2510 Atherosclerotic heart disease of native coronary artery without angina pectoris: Secondary | ICD-10-CM

## 2017-05-23 DIAGNOSIS — N183 Chronic kidney disease, stage 3 unspecified: Secondary | ICD-10-CM

## 2017-05-23 DIAGNOSIS — I129 Hypertensive chronic kidney disease with stage 1 through stage 4 chronic kidney disease, or unspecified chronic kidney disease: Secondary | ICD-10-CM

## 2017-05-23 DIAGNOSIS — I10 Essential (primary) hypertension: Secondary | ICD-10-CM

## 2017-05-23 DIAGNOSIS — Z6841 Body Mass Index (BMI) 40.0 and over, adult: Secondary | ICD-10-CM

## 2017-05-23 LAB — LP+ALT+AST PICCOLO, WAIVED
ALT (SGPT) Piccolo, Waived: 23 U/L (ref 10–47)
AST (SGOT) Piccolo, Waived: 45 U/L — ABNORMAL HIGH (ref 11–38)
CHOL/HDL RATIO PICCOLO,WAIVE: 3.1 mg/dL
Cholesterol Piccolo, Waived: 152 mg/dL (ref <200)
HDL CHOL PICCOLO, WAIVED: 49 mg/dL — AB (ref >59)
LDL CHOL CALC PICCOLO WAIVED: 85 mg/dL (ref <100)
TRIGLYCERIDES PICCOLO,WAIVED: 91 mg/dL (ref <150)
VLDL Chol Calc Piccolo,Waive: 18 mg/dL (ref <30)

## 2017-05-23 NOTE — Assessment & Plan Note (Signed)
Discussed compliance techniques and remembering to take medications. Patient will try to do better and observe blood pressure. If blood pressure not doing better will reevaluate.

## 2017-05-23 NOTE — Progress Notes (Signed)
BP (!) 143/84   Pulse 92   Wt (!) 353 lb (160.1 kg)   SpO2 99%   BMI 63.54 kg/m    Subjective:    Patient ID: Mary Webb, female    DOB: Dec 01, 1955, 61 y.o.   MRN: [id number]  HPI: Mary Webb is a 61 y.o. female  Chief Complaint  Patient presents with  . Follow-up  . Hypertension  . Hyperlipidemia  Patient follow-up hypertension hypercholesterol has had a lot of family stress with hospitalizations and is been taking blood pressure medicine intermittently. There is been no side effects and when taking medication does well. Cholesterol controlled without medication. Patient still struggling with morbid obesity  Relevant past medical, surgical, family and social history reviewed and updated as indicated. Interim medical history since our last visit reviewed. Allergies and medications reviewed and updated.  Review of Systems  Constitutional: Negative.   Respiratory: Negative.   Cardiovascular: Negative.     Per HPI unless specifically indicated above     Objective:    BP (!) 143/84   Pulse 92   Wt (!) 353 lb (160.1 kg)   SpO2 99%   BMI 63.54 kg/m   Wt Readings from Last 3 Encounters:  05/23/17 (!) 353 lb (160.1 kg)  10/26/16 (!) 356 lb 6.4 oz (161.7 kg)  05/04/16 (!) 380 lb (172.4 kg)    Physical Exam  Constitutional: She is oriented to person, place, and time. She appears well-developed and well-nourished.  HENT:  Head: Normocephalic and atraumatic.  Eyes: Conjunctivae and EOM are normal.  Neck: Normal range of motion.  Cardiovascular: Normal rate, regular rhythm and normal heart sounds.   Pulmonary/Chest: Effort normal and breath sounds normal.  Musculoskeletal: Normal range of motion.  Neurological: She is alert and oriented to person, place, and time.  Skin: No erythema.  Psychiatric: She has a normal mood and affect. Her behavior is normal. Judgment and thought content normal.    Results for orders placed or performed in visit on 10/26/16    Comprehensive metabolic panel  Result Value Ref Range   Glucose 108 (H) 65 - 99 mg/dL   BUN 12 8 - 27 mg/dL   Creatinine, Ser 1.00 0.57 - 1.00 mg/dL   GFR calc non Af Amer 61 >59 mL/min/1.73   GFR calc Af Amer 70 >59 mL/min/1.73   BUN/Creatinine Ratio 12 12 - 28   Sodium 140 134 - 144 mmol/L   Potassium 3.8 3.5 - 5.2 mmol/L   Chloride 98 96 - 106 mmol/L   CO2 27 18 - 29 mmol/L   Calcium 9.1 8.7 - 10.3 mg/dL   Total Protein 7.5 6.0 - 8.5 g/dL   Albumin 4.3 3.6 - 4.8 g/dL   Globulin, Total 3.2 1.5 - 4.5 g/dL   Albumin/Globulin Ratio 1.3 1.2 - 2.2   Bilirubin Total 0.4 0.0 - 1.2 mg/dL   Alkaline Phosphatase 76 39 - 117 IU/L   AST 13 0 - 40 IU/L   ALT 15 0 - 32 IU/L  Lipid panel  Result Value Ref Range   Cholesterol, Total 163 100 - 199 mg/dL   Triglycerides 102 0 - 149 mg/dL   HDL 46 >39 mg/dL   VLDL Cholesterol Cal 20 5 - 40 mg/dL   LDL Calculated 97 0 - 99 mg/dL   Chol/HDL Ratio 3.5 0.0 - 4.4 ratio units  CBC with Differential/Platelet  Result Value Ref Range   WBC 6.5 3.4 - 10.8 x10E3/uL   RBC 4.73  3.77 - 5.28 x10E6/uL   Hemoglobin 13.9 11.1 - 15.9 g/dL   Hematocrit 41.7 34.0 - 46.6 %   MCV 88 79 - 97 fL   MCH 29.4 26.6 - 33.0 pg   MCHC 33.3 31.5 - 35.7 g/dL   RDW 14.6 12.3 - 15.4 %   Platelets 278 150 - 379 x10E3/uL   Neutrophils 58 Not Estab. %   Lymphs 29 Not Estab. %   Monocytes 10 Not Estab. %   Eos 2 Not Estab. %   Basos 1 Not Estab. %   Neutrophils Absolute 3.8 1.4 - 7.0 x10E3/uL   Lymphocytes Absolute 1.9 0.7 - 3.1 x10E3/uL   Monocytes Absolute 0.7 0.1 - 0.9 x10E3/uL   EOS (ABSOLUTE) 0.1 0.0 - 0.4 x10E3/uL   Basophils Absolute 0.0 0.0 - 0.2 x10E3/uL   Immature Granulocytes 0 Not Estab. %   Immature Grans (Abs) 0.0 0.0 - 0.1 x10E3/uL  TSH  Result Value Ref Range   TSH 2.260 0.450 - 4.500 uIU/mL  Urinalysis, Routine w reflex microscopic  Result Value Ref Range   Specific Gravity, UA 1.015 1.005 - 1.030   pH, UA 7.0 5.0 - 7.5   Color, UA Yellow Yellow    Appearance Ur Clear Clear   Leukocytes, UA Negative Negative   Protein, UA Negative Negative/Trace   Glucose, UA Negative Negative   Ketones, UA Negative Negative   RBC, UA Negative Negative   Bilirubin, UA Negative Negative   Urobilinogen, Ur 1.0 0.2 - 1.0 mg/dL   Nitrite, UA Negative Negative      Assessment & Plan:   Problem List Items Addressed This Visit      Cardiovascular and Mediastinum   CAD (coronary artery disease)    The current medical regimen is effective;  continue present plan and medications.         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 Patient will try to do better and observe blood pressure. If blood pressure not doing better will reevaluate.        Other   BMI 60.0-69.9, adult (HCC) (Chronic)    Discuss weight loss diet exercise      Hyperlipidemia (Chronic)    Controlled without medication      Relevant Orders   LP+ALT+AST Piccolo, Waived    Other Visit Diagnoses    Essential hypertension       Relevant Orders   Basic metabolic panel       Follow up plan: Return in about 6 months (around 11/23/2017) for Physical Exam.

## 2017-05-23 NOTE — Assessment & Plan Note (Signed)
Discuss weight loss diet exercise

## 2017-05-23 NOTE — Assessment & Plan Note (Signed)
The current medical regimen is effective;  continue present plan and medications.  

## 2017-05-23 NOTE — Assessment & Plan Note (Signed)
Controlled without medication

## 2017-05-24 ENCOUNTER — Encounter: Payer: Self-pay | Admitting: Family Medicine

## 2017-05-24 LAB — BASIC METABOLIC PANEL
BUN / CREAT RATIO: 12 (ref 12–28)
BUN: 13 mg/dL (ref 8–27)
CO2: 26 mmol/L (ref 20–29)
CREATININE: 1.13 mg/dL — AB (ref 0.57–1.00)
Calcium: 9.4 mg/dL (ref 8.7–10.3)
Chloride: 100 mmol/L (ref 96–106)
GFR calc Af Amer: 60 mL/min/{1.73_m2} (ref >59)
GFR, EST NON AFRICAN AMERICAN: 52 mL/min/{1.73_m2} — AB (ref >59)
Glucose: 107 mg/dL — ABNORMAL HIGH (ref 65–99)
Potassium: 3.9 mmol/L (ref 3.5–5.2)
SODIUM: 140 mmol/L (ref 134–144)

## 2017-10-25 ENCOUNTER — Ambulatory Visit: Payer: Medicare Other

## 2017-10-26 ENCOUNTER — Ambulatory Visit (INDEPENDENT_AMBULATORY_CARE_PROVIDER_SITE_OTHER): Payer: Medicare Other

## 2017-10-26 VITALS — BP 153/85 | HR 84 | Temp 97.8°F | Resp 17 | Ht 63.0 in | Wt 379.6 lb

## 2017-10-26 DIAGNOSIS — E7800 Pure hypercholesterolemia, unspecified: Secondary | ICD-10-CM | POA: Diagnosis not present

## 2017-10-26 DIAGNOSIS — I129 Hypertensive chronic kidney disease with stage 1 through stage 4 chronic kidney disease, or unspecified chronic kidney disease: Secondary | ICD-10-CM | POA: Diagnosis not present

## 2017-10-26 DIAGNOSIS — Z1159 Encounter for screening for other viral diseases: Secondary | ICD-10-CM

## 2017-10-26 DIAGNOSIS — N183 Chronic kidney disease, stage 3 unspecified: Secondary | ICD-10-CM

## 2017-10-26 DIAGNOSIS — Z114 Encounter for screening for human immunodeficiency virus [HIV]: Secondary | ICD-10-CM

## 2017-10-26 DIAGNOSIS — Z0000 Encounter for general adult medical examination without abnormal findings: Secondary | ICD-10-CM | POA: Diagnosis not present

## 2017-10-26 DIAGNOSIS — I25110 Atherosclerotic heart disease of native coronary artery with unstable angina pectoris: Secondary | ICD-10-CM | POA: Diagnosis not present

## 2017-10-26 LAB — URINALYSIS, ROUTINE W REFLEX MICROSCOPIC
Bilirubin, UA: NEGATIVE
Glucose, UA: NEGATIVE
KETONES UA: NEGATIVE
LEUKOCYTES UA: NEGATIVE
NITRITE UA: NEGATIVE
PH UA: 7 (ref 5.0–7.5)
Protein, UA: NEGATIVE
RBC, UA: NEGATIVE
Specific Gravity, UA: 1.015 (ref 1.005–1.030)
UUROB: 0.2 mg/dL (ref 0.2–1.0)

## 2017-10-26 NOTE — Progress Notes (Signed)
Subjective:   Mary Webb is a 61 y.o. female who presents for Medicare Annual (Subsequent) preventive examination.  Review of Systems:   Cardiac Risk Factors include: obesity (BMI >30kg/m2);dyslipidemia;family history of premature cardiovascular disease;hypertension     Objective:     Vitals: BP (!) 153/85 (BP Location: Left Arm, Patient Position: Sitting)   Pulse 84   Temp 97.8 F (36.6 C) (Oral)   Resp 17   Ht 5\' 3"  (1.6 m)   Wt (!) 379 lb 9.6 oz (172.2 kg)   BMI 67.24 kg/m   Body mass index is 67.24 kg/m.  Advanced Directives 10/26/2017  Does Patient Have a Medical Advance Directive? No  Would patient like information on creating a medical advance directive? Yes (MAU/Ambulatory/Procedural Areas - Information given)    Tobacco Social History   Tobacco Use  Smoking Status Never Smoker  Smokeless Tobacco Never Used     Counseling given: Not Answered   Clinical Intake:  Pre-visit preparation completed: Yes  Pain : 0-10 Pain Score: 5  Pain Location: Knee Pain Orientation: Right, Left Pain Descriptors / Indicators: Aching Pain Onset: More than a month ago Pain Frequency: Constant     Nutritional Status: BMI > 30  Obese Nutritional Risks: Unintentional weight gain Diabetes: No  Activities of Daily Living: Independent Ambulation: Independent with device- listed below Home Assistive Devices/Equipment: Eyeglasses, Walker (specify Type) Medication Administration: Independent Home Management: Independent  Barriers to Care Management & Learning: None  Do you feel unsafe in your current relationship?: No(married) Do you feel physically threatened by others?: No Anyone hurting you at home, work, or school?: No Unable to ask?: No  How often do you need to have someone help you when you read instructions, pamphlets, or other written materials from your doctor or pharmacy?: 1 - Never What is the last grade level you completed in school?: 2 years  college  Interpreter Needed?: No  Information entered by :: Aveya Beal,LPN  Past Medical History:  Diagnosis Date  . Arthritis   . CAD (coronary artery disease)   . CKD (chronic kidney disease)   . CVA (cerebral infarction)    L sided  . Dysrhythmia   . Hyperlipidemia   . Lymphadenopathy   . Pituitary adenoma ([place])   . Presence of permanent cardiac pacemaker   . Recurrent depression ([place])   . Shortness of breath dyspnea   . Sick sinus syndrome ([place])   . Stroke [facility])    Past Surgical History:  Procedure Laterality Date  . ABDOMINAL HYSTERECTOMY    . COLONOSCOPY WITH PROPOFOL N/A 01/05/2016   Procedure: COLONOSCOPY WITH PROPOFOL;  Surgeon: Lucilla Lame, MD;  Location: [facility] ENDOSCOPY;  Service: Endoscopy;  Laterality: N/A;  . PACEMAKER INSERTION  2015   Dr. Josefa Half   Family History  Problem Relation Age of Onset  . Hypertension Mother   . Hypertension Father   . Diabetes Father        under control  . Hypertension Maternal Grandmother   . Depression Maternal Grandmother   . Hypertension Maternal Grandfather   . Hypertension Paternal Grandmother   . Hypertension Paternal Grandfather   . Thyroid disease Maternal Aunt    Social History   Socioeconomic History  . Marital status: Married    Spouse name: None  . Number of children: None  . Years of education: None  . Highest education level: None  Social Needs  . Financial resource strain: Not hard at all  . Food insecurity -  worry: Never true  . Food insecurity - inability: Never true  . Transportation needs - medical: No  . Transportation needs - non-medical: No  Occupational History  . None  Tobacco Use  . Smoking status: Never Smoker  . Smokeless tobacco: Never Used  Substance and Sexual Activity  . Alcohol use: No  . Drug use: No  . Sexual activity: None  Other Topics Concern  . None  Social History Narrative  . None    Outpatient Encounter Medications as of 10/26/2017  Medication Sig  .  Ascorbic Acid (VITAMIN C) 1000 MG tablet Take by mouth.  . chlorthalidone (HYGROTON) 25 MG tablet Take 1 tablet (25 mg total) by mouth daily.  . Cholecalciferol (VITAMIN D3) 1000 units CAPS Take by mouth. Taking once a week  . clopidogrel (PLAVIX) 75 MG tablet Take 1 tablet (75 mg total) by mouth daily.  . Cyanocobalamin (B-12) 5000 MCG CAPS Take 5,000 Units by mouth once a week.  . losartan (COZAAR) 100 MG tablet Take 1 tablet (100 mg total) by mouth daily.  . potassium chloride (KLOR-CON) 20 MEQ packet Take 20 mEq by mouth 2 (two) times daily.  Marland Kitchen pyridOXINE (VITAMIN B-6) 25 MG tablet Take by mouth.   No facility-administered encounter medications on file as of 10/26/2017.     Activities of Daily Living In your present state of health, do you have any difficulty performing the following activities: 10/26/2017 10/26/2016  Hearing? N N  Vision? N N  Difficulty concentrating or making decisions? N N  Walking or climbing stairs? Y Y  Comment - Stairs  Dressing or bathing? N N  Doing errands, shopping? Y N  Preparing Food and eating ? N -  Using the Toilet? N -  In the past six months, have you accidently leaked urine? N -  Do you have problems with loss of bowel control? N -  Managing your Medications? N -  Managing your Finances? N -  Housekeeping or managing your Housekeeping? N -  Some recent data might be hidden    Timed Get Up and Go performed: completed in with no use of assistive devices. Steady gait. No intervention needed at this time.   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 DIET - INCREASE WATER INTAKE      Recommend drinking at least 6-7 glasses of water a day       Fall Risk Fall Risk  10/26/2017 05/23/2017 10/26/2016  Falls in the past year? Yes Yes Yes  Number falls in past yr: 2 or more 1 2 or more  Injury with Fall? No No No  Follow up Falls prevention discussed Falls evaluation completed -   Is the patient's home free of loose throw rugs in walkways, pet beds, electrical cords, etc?   yes      Grab bars in the bathroom? yes      Handrails on the stairs?   yes      Adequate lighting?   yes  Depression Screen PHQ 2/9 Scores 10/26/2017 05/23/2017 10/26/2016  PHQ - 2 Score 3 0 0  PHQ- 9 Score 7 - -  Cognitive Function     6CIT Screen 10/26/2017  What Year? 0 points  What month? 0 points  What time? 0 points  Count back from 20 0 points  Months in reverse 0 points  Repeat phrase 2 points  Total Score 2    Immunization History  Administered Date(s) Administered  . Pneumococcal Conjugate-13 06/03/2014  . Pneumococcal Polysaccharide-23 08/07/2007  . Td 07/18/2006   Screening Tests Health Maintenance  Topic Date Due  . INFLUENZA VACCINE  06/28/2018 (Originally 06/14/2017)  . MAMMOGRAM  04/13/2018  . COLONOSCOPY  01/04/2026  . Hepatitis C Screening  Completed  . HIV Screening  Completed  . TETANUS/TDAP  Discontinued   Cancer Screenings: Lung:  Low Dose CT Chest recommended if Age 80-80 years, 30 pack-year currently smoking OR have quit w/in 15years. Patient does not qualify. Breast:  Up to date on Mammogram? Yes  Up to date of Bone Density/Dexa? No, due at age 22 Colorectal: completed  Additional Screenings: Hepatitis B/HIV/Syphillis: HIV done today Hepatitis C Screening:  Hep C done today      Plan:    I have personally reviewed and addressed the Medicare Annual Wellness questionnaire and have noted the following in the patient's chart:  A. Medical and social history B. Use of alcohol, tobacco or illicit drugs  C. Current medications and  supplements D. Functional ability and status E.  Nutritional status F.  Physical activity G. Advance directives H. List of other physicians I.  Hospitalizations, surgeries, and ER visits in previous 12 months J.  Level [place] such as hearing and vision if needed, cognitive and depression L. Referrals and appointments   In addition, I have reviewed and discussed with patient certain preventive protocols, quality metrics, and best practice recommendations. A written personalized care plan for preventive services as well as general preventive health recommendations were provided to patient.   Signed,  Tyler Aas, LPN Nurse Health Advisor   Nurse Notes: PHQ-9 score is 7, not on any medication currently, states she has taken something in the past but didn't like taking it. She has had a 26 pound weight gain since 05/2017. Pt states her appetite hasn't changed and denies any overeating. She denies any swelling in bilateral legs at this time but does feel it is water retention somewhere.

## 2017-10-26 NOTE — Patient Instructions (Addendum)
[name] , Thank you for taking time to come for your Medicare Wellness Visit. I appreciate your ongoing commitment to your health goals. Please review the following plan we discussed and let me know if I can assist you in the future.   Screening recommendations/referrals: Colonoscopy: completed 01/05/2016 Mammogram: completed 04/13/2018 Bone Density: due at age 61  Recommended yearly ophthalmology/optometry visit for glaucoma screening and checkup Recommended yearly dental visit for hygiene and checkup  Vaccinations: Influenza vaccine: due now- declined due to allergies Pneumococcal vaccine: completed  Tdap vaccine: allergy to vaccine, not required Shingles vaccine: due, check with your insurance company for coverage  Advanced directives: Advance directive discussed with you today. Even though you declined this today please call our office should you change your mind and we can give you the proper paperwork for you to fill out.  Conditions/risks identified: recommend drinking at least 5-6 glasses of water a day   Next appointment:  Follow up on 11/01/2017 at 10:00 am with Dr.Crissman. Follow up in one year for your annual wellness exam.   Preventive Care 40-64 Years, Female Preventive care refers to lifestyle choices and visits with your health care provider that can promote health and wellness. What does preventive care include?  A yearly physical exam. This is also called an annual well check.  Dental exams once or twice a year.  Routine eye exams. Ask your health care provider how often you should have your eyes checked.  Personal lifestyle choices, including:  Daily care of your teeth and gums.  Regular physical activity.  Eating a healthy diet.  Avoiding tobacco and drug use.  Limiting alcohol use.  Practicing safe sex.  Taking low-dose aspirin daily starting at age 58.  Taking vitamin and mineral supplements as recommended by your health care provider. What  happens during an annual well check? The services and screenings done by your health care provider during your annual well check will depend on your age, overall health, lifestyle risk factors, and family history of disease. Counseling  Your health care provider may ask you questions about your:  Alcohol use.  Tobacco use.  Drug use.  Emotional well-being.  Home and relationship well-being.  Sexual activity.  Eating habits.  Work and work Statistician.  Method of birth control.  Menstrual cycle.  Pregnancy history. Screening  You may have the following tests or measurements:  Height, weight, and BMI.  Blood pressure.  Lipid and cholesterol levels. These may be checked every 5 years, or more frequently if you are over 58 years old.  Skin check.  Lung cancer screening. You may have this screening every year starting at age 95 if you have a 30-pack-year history of smoking and currently smoke or have quit within the past 15 years.  Fecal occult blood test (FOBT) of the stool. You may have this test every year starting at age 22.  Flexible sigmoidoscopy or colonoscopy. You may have a sigmoidoscopy every 5 years or a colonoscopy every 10 years starting at age 37.  Hepatitis C blood test.  Hepatitis B blood test.  Sexually transmitted disease (STD) testing.  Diabetes screening. This is done by checking your blood sugar (glucose) after you have not eaten for a while (fasting). You may have this done every 1-3 years.  Mammogram. This may be done every 1-2 years. Talk to your health care provider about when you should start having regular mammograms. This may depend on whether you have a family history of breast cancer.  BRCA-related cancer screening. This may be done if you have a family history of breast, ovarian, tubal, or peritoneal cancers.  Pelvic exam and Pap test. This may be done every 3 years starting at age 47. Starting at age 37, this may be done every 5 years  if you have a Pap test in combination with an HPV test.  Bone density scan. This is done to screen for osteoporosis. You may have this scan if you are at high risk for osteoporosis. Discuss your test results, treatment options, and if necessary, the need for more tests with your health care provider. Vaccines  Your health care provider may recommend certain vaccines, such as:  Influenza vaccine. This is recommended every year.  Tetanus, diphtheria, and acellular pertussis (Tdap, Td) vaccine. You may need a Td booster every 10 years.  Zoster vaccine. You may need this after age 51.  Pneumococcal 13-valent conjugate (PCV13) vaccine. You may need this if you have certain conditions and were not previously vaccinated.  Pneumococcal polysaccharide (PPSV23) vaccine. You may need one or two doses if you smoke cigarettes or if you have certain conditions. Talk to your health care provider about which screenings and vaccines you need and how often you need them. This information is not intended to replace advice given to you by your health care provider. Make sure you discuss any questions you have with your health care provider. Document Released: 11/27/2015 Document Revised: 07/20/2016 Document Reviewed: 09/01/2015 Elsevier Interactive Patient Education  2017 [place] Prevention in the Home Falls can cause injuries. They can happen to people of all ages. There are many things you can do to make your home safe and to help prevent falls. What can I do on the outside of my home?  Regularly fix the edges of walkways and driveways and fix any cracks.  Remove anything that might make you trip as you walk through a door, such as a raised step or threshold.  Trim any bushes or trees on the path to your home.  Use bright outdoor lighting.  Clear any walking paths of anything that might make someone trip, such as rocks or tools.  Regularly check to see if handrails are loose or  broken. Make sure that both sides of any steps have handrails.  Any raised decks and porches should have guardrails on the edges.  Have any leaves, snow, or ice cleared regularly.  Use sand or salt on walking paths during winter.  Clean up any spills in your garage right away. This includes oil or grease spills. What can I do in the bathroom?  Use night lights.  Install grab bars by the toilet and in the tub and shower. Do not use towel bars as grab bars.  Use non-skid mats or decals in the tub or shower.  If you need to sit down in the shower, use a plastic, non-slip stool.  Keep the floor dry. Clean up any water that spills on the floor as soon as it happens.  Remove soap buildup in the tub or shower regularly.  Attach bath mats securely with double-sided non-slip rug tape.  Do not have throw rugs and other things on the floor that can make you trip. What can I do in the bedroom?  Use night lights.  Make sure that you have a light by your bed that is easy to reach.  Do not use any sheets or blankets that are too big for your bed. They  should not hang down onto the floor.  Have a firm chair that has side arms. You can use this for support while you get dressed.  Do not have throw rugs and other things on the floor that can make you trip. What can I do in the kitchen?  Clean up any spills right away.  Avoid walking on wet floors.  Keep items that you use a lot in easy-to-reach places.  If you need to reach something above you, use a strong step stool that has a grab bar.  Keep electrical cords out of the way.  Do not use floor polish or wax that makes floors slippery. If you must use wax, use non-skid floor wax.  Do not have throw rugs and other things on the floor that can make you trip. What can I do with my stairs?  Do not leave any items on the stairs.  Make sure that there are handrails on both sides of the stairs and use them. Fix handrails that are  broken or loose. Make sure that handrails are as long as the stairways.  Check any carpeting to make sure that it is firmly attached to the stairs. Fix any carpet that is loose or worn.  Avoid having throw rugs at the top or bottom of the stairs. If you do have throw rugs, attach them to the floor with carpet tape.  Make sure that you have a light switch at the top of the stairs and the bottom of the stairs. If you do not have them, ask someone to add them for you. What else can I do to help prevent falls?  Wear shoes that:  Do not have high heels.  Have rubber bottoms.  Are comfortable and fit you well.  Are closed at the toe. Do not wear sandals.  If you use a stepladder:  Make sure that it is fully opened. Do not climb a closed stepladder.  Make sure that both sides of the stepladder are locked into place.  Ask someone to hold it for you, if possible.  Clearly mark and make sure that you can see:  Any grab bars or handrails.  First and last steps.  Where the edge of each step is.  Use tools that help you move around (mobility aids) if they are needed. These include:  Canes.  Walkers.  Scooters.  Crutches.  Turn on the lights when you go into a dark area. Replace any light bulbs as soon as they burn out.  Set up your furniture so you have a clear path. Avoid moving your furniture around.  If any of your floors are uneven, fix them.  If there are any pets around you, be aware of where they are.  Review your medicines with your doctor. Some medicines can make you feel dizzy. This can increase your chance of falling. Ask your doctor what other things that you can do to help prevent falls. This information is not intended to replace advice given to you by your health care provider. Make sure you discuss any questions you have with your health care provider. Document Released: 08/27/2009 Document Revised: 04/07/2016 Document Reviewed: 12/05/2014 Elsevier  Interactive Patient Education  2017 [employer].

## 2017-10-27 LAB — CBC WITH DIFFERENTIAL/PLATELET
BASOS ABS: 0 10*3/uL (ref 0.0–0.2)
Basos: 0 %
EOS (ABSOLUTE): 0.2 10*3/uL (ref 0.0–0.4)
EOS: 3 %
HEMATOCRIT: 42.3 % (ref 34.0–46.6)
HEMOGLOBIN: 13.6 g/dL (ref 11.1–15.9)
IMMATURE GRANS (ABS): 0 10*3/uL (ref 0.0–0.1)
Immature Granulocytes: 0 %
LYMPHS: 29 %
Lymphocytes Absolute: 2 10*3/uL (ref 0.7–3.1)
MCH: 29 pg (ref 26.6–33.0)
MCHC: 32.2 g/dL (ref 31.5–35.7)
MCV: 90 fL (ref 79–97)
MONOCYTES: 9 %
Monocytes Absolute: 0.6 10*3/uL (ref 0.1–0.9)
NEUTROS ABS: 3.9 10*3/uL (ref 1.4–7.0)
Neutrophils: 59 %
Platelets: 268 10*3/uL (ref 150–379)
RBC: 4.69 x10E6/uL (ref 3.77–5.28)
RDW: 14.8 % (ref 12.3–15.4)
WBC: 6.7 10*3/uL (ref 3.4–10.8)

## 2017-10-27 LAB — COMPREHENSIVE METABOLIC PANEL
ALBUMIN: 4.1 g/dL (ref 3.6–4.8)
ALT: 17 IU/L (ref 0–32)
AST: 15 IU/L (ref 0–40)
Albumin/Globulin Ratio: 1.2 (ref 1.2–2.2)
Alkaline Phosphatase: 72 IU/L (ref 39–117)
BUN / CREAT RATIO: 9 — AB (ref 12–28)
BUN: 10 mg/dL (ref 8–27)
Bilirubin Total: 0.4 mg/dL (ref 0.0–1.2)
CO2: 30 mmol/L — AB (ref 20–29)
CREATININE: 1.13 mg/dL — AB (ref 0.57–1.00)
Calcium: 9.2 mg/dL (ref 8.7–10.3)
Chloride: 98 mmol/L (ref 96–106)
GFR calc non Af Amer: 52 mL/min/{1.73_m2} — ABNORMAL LOW (ref >59)
GFR, EST AFRICAN AMERICAN: 60 mL/min/{1.73_m2} (ref >59)
GLUCOSE: 111 mg/dL — AB (ref 65–99)
Globulin, Total: 3.3 g/dL (ref 1.5–4.5)
Potassium: 4.2 mmol/L (ref 3.5–5.2)
Sodium: 139 mmol/L (ref 134–144)
TOTAL PROTEIN: 7.4 g/dL (ref 6.0–8.5)

## 2017-10-27 LAB — HEPATITIS C ANTIBODY: Hep C Virus Ab: <0.1 s/co ratio (ref 0.0–0.9)

## 2017-10-27 LAB — TSH: TSH: 1.91 u[IU]/mL (ref 0.450–4.500)

## 2017-10-27 LAB — HIV ANTIBODY (ROUTINE TESTING W REFLEX): HIV Screen 4th Generation wRfx: NONREACTIVE

## 2017-11-01 ENCOUNTER — Ambulatory Visit (INDEPENDENT_AMBULATORY_CARE_PROVIDER_SITE_OTHER): Payer: Medicare Other | Admitting: Family Medicine

## 2017-11-01 ENCOUNTER — Encounter: Payer: Self-pay | Admitting: Family Medicine

## 2017-11-01 VITALS — BP 120/56 | HR 68 | Ht 64.0 in | Wt 378.0 lb

## 2017-11-01 DIAGNOSIS — Z7189 Other specified counseling: Secondary | ICD-10-CM

## 2017-11-01 DIAGNOSIS — D352 Benign neoplasm of pituitary gland: Secondary | ICD-10-CM

## 2017-11-01 DIAGNOSIS — I129 Hypertensive chronic kidney disease with stage 1 through stage 4 chronic kidney disease, or unspecified chronic kidney disease: Secondary | ICD-10-CM

## 2017-11-01 DIAGNOSIS — I10 Essential (primary) hypertension: Secondary | ICD-10-CM | POA: Diagnosis not present

## 2017-11-01 DIAGNOSIS — Z6841 Body Mass Index (BMI) 40.0 and over, adult: Secondary | ICD-10-CM | POA: Diagnosis not present

## 2017-11-01 DIAGNOSIS — Z0001 Encounter for general adult medical examination with abnormal findings: Secondary | ICD-10-CM

## 2017-11-01 DIAGNOSIS — I6322 Cerebral infarction due to unspecified occlusion or stenosis of basilar arteries: Secondary | ICD-10-CM

## 2017-11-01 DIAGNOSIS — E7800 Pure hypercholesterolemia, unspecified: Secondary | ICD-10-CM | POA: Diagnosis not present

## 2017-11-01 DIAGNOSIS — N183 Chronic kidney disease, stage 3 (moderate): Secondary | ICD-10-CM | POA: Diagnosis not present

## 2017-11-01 DIAGNOSIS — I495 Sick sinus syndrome: Secondary | ICD-10-CM

## 2017-11-01 DIAGNOSIS — Z0000 Encounter for general adult medical examination without abnormal findings: Secondary | ICD-10-CM

## 2017-11-01 MED ORDER — LOSARTAN POTASSIUM 100 MG PO TABS
100.0000 mg | ORAL_TABLET | Freq: Every day | ORAL | 4 refills | Status: DC
Start: 2017-11-01 — End: 2018-12-20

## 2017-11-01 MED ORDER — CLOPIDOGREL BISULFATE 75 MG PO TABS: 75.0000 mg | ORAL_TABLET | Freq: Every day | ORAL | 4 refills | Status: DC

## 2017-11-01 MED ORDER — CHLORTHALIDONE 25 MG PO TABS: 25.0000 mg | ORAL_TABLET | Freq: Every day | ORAL | 4 refills | Status: DC

## 2017-11-01 MED ORDER — POTASSIUM CHLORIDE 20 MEQ PO PACK: 20.0000 meq | PACK | Freq: Two times a day (BID) | ORAL | 4 refills | Status: DC

## 2017-11-01 NOTE — Assessment & Plan Note (Signed)
discussed importance of weight loss diet nutrition

## 2017-11-01 NOTE — Assessment & Plan Note (Signed)
A voluntary discussion about advance care planning including the explanation and discussion of advance directives was extensively discussed  with the patient.  Explanation about the health care proxy and Living will was reviewed and packet with forms with explanation of how to fill them out was given.    

## 2017-11-01 NOTE — Assessment & Plan Note (Signed)
stable °

## 2017-11-01 NOTE — Progress Notes (Signed)
BP (!) 120/56   Pulse 68   Ht 5' 4" (1.626 m)   Wt (!) 378 lb (171.5 kg)   SpO2 98%   BMI 64.88 kg/m    Subjective:    Patient ID: Mary Webb, female    DOB: 1955-11-15, 61 y.o.   MRN: [id number]  HPI: Mary Webb is a 61 y.o. female  Chief Complaint  Patient presents with  . Annual Exam    Saw Tiffany.   patient follow-up medical problems doing stable. Except as gained a lot of weight and blood pressure at times is elevated. Has chronic problems with knee pain and had a slip but no fall and was called on her right arm and now with shoulder pain for the last week or so but starting to get somewhat better No heart issues or other cardiac issues doing stable. No problems with medications.  Relevant past medical, surgical, family and social history reviewed and updated as indicated. Interim medical history since our last visit reviewed. Allergies and medications reviewed and updated.  Review of Systems  Constitutional: Negative.   HENT: Negative.   Eyes: Negative.   Respiratory: Negative.   Cardiovascular: Negative.   Gastrointestinal: Negative.   Endocrine: Negative.   Genitourinary: Negative.   Musculoskeletal: Negative.   Skin: Negative.   Allergic/Immunologic: Negative.   Neurological: Negative.   Hematological: Negative.   Psychiatric/Behavioral: Negative.     Per HPI unless specifically indicated above     Objective:    BP (!) 120/56   Pulse 68   Ht 5' 4" (1.626 m)   Wt (!) 378 lb (171.5 kg)   SpO2 98%   BMI 64.88 kg/m   Wt Readings from Last 3 Encounters:  11/01/17 (!) 378 lb (171.5 kg)  10/26/17 (!) 379 lb 9.6 oz (172.2 kg)  05/23/17 (!) 353 lb (160.1 kg)    Physical Exam  Constitutional: She is oriented to person, place, and time. She appears well-developed and well-nourished.  Massive morbid obesity  HENT:  Head: Normocephalic and atraumatic.  Right Ear: External ear normal.  Left Ear: External ear normal.  Nose: Nose normal.    Mouth/Throat: Oropharynx is clear and moist.  Eyes: Conjunctivae and EOM are normal. Pupils are equal, round, and reactive to light.  Neck: Normal range of motion. Neck supple. Carotid bruit is not present.  Cardiovascular: Normal rate, regular rhythm and normal heart sounds.  No murmur heard. Pulmonary/Chest: Effort normal and breath sounds normal. She exhibits no mass. Right breast exhibits no mass, no skin change and no tenderness. Left breast exhibits no mass, no skin change and no tenderness. Breasts are symmetrical.  Abdominal: Soft. Bowel sounds are normal. There is no hepatosplenomegaly.  Musculoskeletal: Normal range of motion.  Neurological: She is alert and oriented to person, place, and time.  Skin: No rash noted.  Psychiatric: She has a normal mood and affect. Her behavior is normal. Judgment and thought content normal.    Results for orders placed or performed in visit on 10/26/17  Urinalysis, Routine w reflex microscopic  Result Value Ref Range   Specific Gravity, UA 1.015 1.005 - 1.030   pH, UA 7.0 5.0 - 7.5   Color, UA Yellow Yellow   Appearance Ur Hazy (A) Clear   Leukocytes, UA Negative Negative   Protein, UA Negative Negative/Trace   Glucose, UA Negative Negative   Ketones, UA Negative Negative   RBC, UA Negative Negative   Bilirubin, UA Negative Negative   Urobilinogen,  Ur 0.2 0.2 - 1.0 mg/dL   Nitrite, UA Negative Negative  TSH  Result Value Ref Range   TSH 1.910 0.450 - 4.500 uIU/mL  CBC with Differential  Result Value Ref Range   WBC 6.7 3.4 - 10.8 x10E3/uL   RBC 4.69 3.77 - 5.28 x10E6/uL   Hemoglobin 13.6 11.1 - 15.9 g/dL   Hematocrit 42.3 34.0 - 46.6 %   MCV 90 79 - 97 fL   MCH 29.0 26.6 - 33.0 pg   MCHC 32.2 31.5 - 35.7 g/dL   RDW 14.8 12.3 - 15.4 %   Platelets 268 150 - 379 x10E3/uL   Neutrophils 59 Not Estab. %   Lymphs 29 Not Estab. %   Monocytes 9 Not Estab. %   Eos 3 Not Estab. %   Basos 0 Not Estab. %   Neutrophils Absolute 3.9 1.4 -  7.0 x10E3/uL   Lymphocytes Absolute 2.0 0.7 - 3.1 x10E3/uL   Monocytes Absolute 0.6 0.1 - 0.9 x10E3/uL   EOS (ABSOLUTE) 0.2 0.0 - 0.4 x10E3/uL   Basophils Absolute 0.0 0.0 - 0.2 x10E3/uL   Immature Granulocytes 0 Not Estab. %   Immature Grans (Abs) 0.0 0.0 - 0.1 x10E3/uL  Comp Met (CMET)  Result Value Ref Range   Glucose 111 (H) 65 - 99 mg/dL   BUN 10 8 - 27 mg/dL   Creatinine, Ser 1.13 (H) 0.57 - 1.00 mg/dL   GFR calc non Af Amer 52 (L) >59 mL/min/1.73   GFR calc Af Amer 60 >59 mL/min/1.73   BUN/Creatinine Ratio 9 (L) 12 - 28   Sodium 139 134 - 144 mmol/L   Potassium 4.2 3.5 - 5.2 mmol/L   Chloride 98 96 - 106 mmol/L   CO2 30 (H) 20 - 29 mmol/L   Calcium 9.2 8.7 - 10.3 mg/dL   Total Protein 7.4 6.0 - 8.5 g/dL   Albumin 4.1 3.6 - 4.8 g/dL   Globulin, Total 3.3 1.5 - 4.5 g/dL   Albumin/Globulin Ratio 1.2 1.2 - 2.2   Bilirubin Total 0.4 0.0 - 1.2 mg/dL   Alkaline Phosphatase 72 39 - 117 IU/L   AST 15 0 - 40 IU/L   ALT 17 0 - 32 IU/L  Hepatitis C antibody screen  Result Value Ref Range   Hep C Virus Ab <0.1 0.0 - 0.9 s/co ratio  HIV antibody  Result Value Ref Range   HIV Screen 4th Generation wRfx Non Reactive Non Reactive      Assessment & Plan:   Problem List Items Addressed This Visit      Cardiovascular and Mediastinum   Sick sinus syndrome (HCC) (Chronic)    stable      Relevant Medications   chlorthalidone (HYGROTON) 25 MG tablet   clopidogrel (PLAVIX) 75 MG tablet   losartan (COZAAR) 100 MG tablet     Endocrine   Pituitary adenoma (HCC) (Chronic)    doing well no further symptoms        Nervous and Auditory   Cerebral infarction (HCC)    Stable after stroke seems to have recovered      Relevant Medications   clopidogrel (PLAVIX) 75 MG tablet     Genitourinary   Hypertensive kidney disease with CKD stage III (HCC)   Relevant Medications   chlorthalidone (HYGROTON) 25 MG tablet   losartan (COZAAR) 100 MG tablet   potassium chloride (KLOR-CON) 20  MEQ packet     Other   BMI 60.0-69.9, adult (HCC) (Chronic)  discussed importance of weight loss diet nutrition      Hyperlipidemia (Chronic)    The current medical regimen is effective;  continue present plan and medications.       Relevant Medications   chlorthalidone (HYGROTON) 25 MG tablet   losartan (COZAAR) 100 MG tablet   Advanced care planning/counseling discussion    A voluntary discussion about advance care planning including the explanation and discussion of advance directives was extensively discussed  with the patient.  Explanation about the health care proxy and Living will was reviewed and packet with forms with explanation of how to fill them out was given.         Other Visit Diagnoses    PE (physical exam), annual    -  Primary   Essential hypertension  (Chronic)      Relevant Medications   chlorthalidone (HYGROTON) 25 MG tablet   losartan (COZAAR) 100 MG tablet   potassium chloride (KLOR-CON) 20 MEQ packet       Follow up plan: Return in about 6 months (around 05/02/2018) for BMP.

## 2017-11-01 NOTE — Assessment & Plan Note (Signed)
The current medical regimen is effective;  continue present plan and medications.  

## 2017-11-01 NOTE — Assessment & Plan Note (Signed)
Stable after stroke seems to have recovered

## 2017-11-01 NOTE — Assessment & Plan Note (Signed)
doing well no further symptoms

## 2017-11-09 DIAGNOSIS — H40013 Open angle with borderline findings, low risk, bilateral: Secondary | ICD-10-CM | POA: Diagnosis not present

## 2017-11-09 DIAGNOSIS — D352 Benign neoplasm of pituitary gland: Secondary | ICD-10-CM | POA: Diagnosis not present

## 2017-11-27 DIAGNOSIS — I495 Sick sinus syndrome: Secondary | ICD-10-CM | POA: Diagnosis not present

## 2018-02-08 ENCOUNTER — Encounter: Payer: Self-pay | Admitting: Family Medicine

## 2018-02-08 ENCOUNTER — Ambulatory Visit (INDEPENDENT_AMBULATORY_CARE_PROVIDER_SITE_OTHER): Payer: Medicare Other | Admitting: Family Medicine

## 2018-02-08 VITALS — HR 98 | Temp 98.4°F | Ht 63.0 in | Wt 375.0 lb

## 2018-02-08 DIAGNOSIS — J019 Acute sinusitis, unspecified: Secondary | ICD-10-CM | POA: Diagnosis not present

## 2018-02-08 NOTE — Progress Notes (Signed)
Pulse 98   Temp 98.4 F (36.9 C) (Oral)   Ht '5\' 3"'  (1.6 m)   Wt (!) 375 lb (170.1 kg)   SpO2 96%   BMI 66.43 kg/m    Subjective:    Patient ID: Mary Webb, female    DOB: 06-01-1956, 62 y.o.   MRN: [id number]  HPI: Mary Webb is a 62 y.o. female  Chief Complaint  Patient presents with  . Sinus Problem  . Nasal Congestion  Patient accompanied by husband assists with history patient with nasal congestion drainage facial pressure discomfort ongoing for 3 weeks may be a little bit better has tried over-the-counter medications with some intermittent success. Has Nettie pot but has not tried it yet.  Relevant past medical, surgical, family and social history reviewed and updated as indicated. Interim medical history since our last visit reviewed. Allergies and medications reviewed and updated.  Review of Systems  Constitutional: Positive for chills and fatigue. Negative for diaphoresis and fever.  HENT: Positive for congestion, rhinorrhea, sinus pressure and sinus pain.   Respiratory: Positive for cough and shortness of breath.   Cardiovascular: Negative.     Per HPI unless specifically indicated above     Objective:    Pulse 98   Temp 98.4 F (36.9 C) (Oral)   Ht '5\' 3"'  (1.6 m)   Wt (!) 375 lb (170.1 kg)   SpO2 96%   BMI 66.43 kg/m   Wt Readings from Last 3 Encounters:  02/08/18 (!) 375 lb (170.1 kg)  11/01/17 (!) 378 lb (171.5 kg)  10/26/17 (!) 379 lb 9.6 oz (172.2 kg)    Physical Exam  Constitutional: She is oriented to person, place, and time. She appears well-developed and well-nourished.  HENT:  Head: Normocephalic and atraumatic.  Right Ear: External ear normal.  Left Ear: External ear normal.  Mouth/Throat: Oropharyngeal exudate present.  Eyes: Conjunctivae and EOM are normal.  Neck: Normal range of motion.  Cardiovascular: Normal rate, regular rhythm and normal heart sounds.  Pulmonary/Chest: Effort normal and breath sounds normal.    Musculoskeletal: Normal range of motion.  Neurological: She is alert and oriented to person, place, and time.  Skin: No erythema.  Psychiatric: She has a normal mood and affect. Her behavior is normal. Judgment and thought content normal.    Results for orders placed or performed in visit on 10/26/17  Urinalysis, Routine w reflex microscopic  Result Value Ref Range   Specific Gravity, UA 1.015 1.005 - 1.030   pH, UA 7.0 5.0 - 7.5   Color, UA Yellow Yellow   Appearance Ur Hazy (A) Clear   Leukocytes, UA Negative Negative   Protein, UA Negative Negative/Trace   Glucose, UA Negative Negative   Ketones, UA Negative Negative   RBC, UA Negative Negative   Bilirubin, UA Negative Negative   Urobilinogen, Ur 0.2 0.2 - 1.0 mg/dL   Nitrite, UA Negative Negative  TSH  Result Value Ref Range   TSH 1.910 0.450 - 4.500 uIU/mL  CBC with Differential  Result Value Ref Range   WBC 6.7 3.4 - 10.8 x10E3/uL   RBC 4.69 3.77 - 5.28 x10E6/uL   Hemoglobin 13.6 11.1 - 15.9 g/dL   Hematocrit 42.3 34.0 - 46.6 %   MCV 90 79 - 97 fL   MCH 29.0 26.6 - 33.0 pg   MCHC 32.2 31.5 - 35.7 g/dL   RDW 14.8 12.3 - 15.4 %   Platelets 268 150 - 379 x10E3/uL   Neutrophils  59 Not Estab. %   Lymphs 29 Not Estab. %   Monocytes 9 Not Estab. %   Eos 3 Not Estab. %   Basos 0 Not Estab. %   Neutrophils Absolute 3.9 1.4 - 7.0 x10E3/uL   Lymphocytes Absolute 2.0 0.7 - 3.1 x10E3/uL   Monocytes Absolute 0.6 0.1 - 0.9 x10E3/uL   EOS (ABSOLUTE) 0.2 0.0 - 0.4 x10E3/uL   Basophils Absolute 0.0 0.0 - 0.2 x10E3/uL   Immature Granulocytes 0 Not Estab. %   Immature Grans (Abs) 0.0 0.0 - 0.1 x10E3/uL  Comp Met (CMET)  Result Value Ref Range   Glucose 111 (H) 65 - 99 mg/dL   BUN 10 8 - 27 mg/dL   Creatinine, Ser 1.13 (H) 0.57 - 1.00 mg/dL   GFR calc non Af Amer 52 (L) >59 mL/min/1.73   GFR calc Af Amer 60 >59 mL/min/1.73   BUN/Creatinine Ratio 9 (L) 12 - 28   Sodium 139 134 - 144 mmol/L   Potassium 4.2 3.5 - 5.2 mmol/L    Chloride 98 96 - 106 mmol/L   CO2 30 (H) 20 - 29 mmol/L   Calcium 9.2 8.7 - 10.3 mg/dL   Total Protein 7.4 6.0 - 8.5 g/dL   Albumin 4.1 3.6 - 4.8 g/dL   Globulin, Total 3.3 1.5 - 4.5 g/dL   Albumin/Globulin Ratio 1.2 1.2 - 2.2   Bilirubin Total 0.4 0.0 - 1.2 mg/dL   Alkaline Phosphatase 72 39 - 117 IU/L   AST 15 0 - 40 IU/L   ALT 17 0 - 32 IU/L  Hepatitis C antibody screen  Result Value Ref Range   Hep C Virus Ab <0.1 0.0 - 0.9 s/co ratio  HIV antibody  Result Value Ref Range   HIV Screen 4th Generation wRfx Non Reactive Non Reactive      Assessment & Plan:   Problem List Items Addressed This Visit    None    Visit Diagnoses    Acute non-recurrent sinusitis, unspecified location    -  Primary    Discussed sinusitis care and treatment. Because of patient's multiple drug intolerances and allergies will avoid antibiotics for now. Discussed use of over-the-counter medications nasal rinse etc. If not getting better will need to reconsider antibiotic use. May consider a Z-Pak but with caution as patient's blood pressure goes up with this. Gave samples of Brio Continue other medications blood pressure medicine etc. is doing well. Follow up plan: Return if symptoms worsen or fail to improve, for As scheduled.

## 2018-04-27 ENCOUNTER — Other Ambulatory Visit: Payer: Self-pay | Admitting: Family Medicine

## 2018-04-27 DIAGNOSIS — Z1231 Encounter for screening mammogram for malignant neoplasm of breast: Secondary | ICD-10-CM

## 2018-05-02 ENCOUNTER — Ambulatory Visit (INDEPENDENT_AMBULATORY_CARE_PROVIDER_SITE_OTHER): Payer: Medicare Other | Admitting: Family Medicine

## 2018-05-02 ENCOUNTER — Encounter: Payer: Self-pay | Admitting: Family Medicine

## 2018-05-02 VITALS — BP 147/82 | HR 94 | Ht 65.0 in | Wt 359.0 lb

## 2018-05-02 DIAGNOSIS — N183 Chronic kidney disease, stage 3 unspecified: Secondary | ICD-10-CM

## 2018-05-02 DIAGNOSIS — I2510 Atherosclerotic heart disease of native coronary artery without angina pectoris: Secondary | ICD-10-CM | POA: Diagnosis not present

## 2018-05-02 DIAGNOSIS — I129 Hypertensive chronic kidney disease with stage 1 through stage 4 chronic kidney disease, or unspecified chronic kidney disease: Secondary | ICD-10-CM | POA: Diagnosis not present

## 2018-05-02 DIAGNOSIS — E7800 Pure hypercholesterolemia, unspecified: Secondary | ICD-10-CM

## 2018-05-02 NOTE — Assessment & Plan Note (Signed)
The current medical regimen is effective;  continue present plan and medications.  

## 2018-05-02 NOTE — Assessment & Plan Note (Signed)
Doing well with 16 pound weight loss patient will continue working.

## 2018-05-02 NOTE — Progress Notes (Signed)
BP (!) 147/82   Pulse 94   Ht '5\' 5"'  (1.651 m)   Wt (!) 359 lb (162.8 kg)   SpO2 97%   BMI 59.74 kg/m    Subjective:    Patient ID: Mary Webb, female    DOB: 1956-11-07, 62 y.o.   MRN: [id number]  HPI: Mary Webb is a 62 y.o. female  Chief Complaint  Patient presents with  . Follow-up  . Hypertension  Patient doing well with blood pressure and medications.  Has been working on diet and is lost 16 pounds. No chest pain shortness of breath PND leg edema is improving somewhat.  Taking medications without problems or side effects.  Relevant past medical, surgical, family and social history reviewed and updated as indicated. Interim medical history since our last visit reviewed. Allergies and medications reviewed and updated.  Review of Systems  Constitutional: Negative.   Respiratory: Negative.   Cardiovascular: Negative.     Per HPI unless specifically indicated above     Objective:    BP (!) 147/82   Pulse 94   Ht '5\' 5"'  (1.651 m)   Wt (!) 359 lb (162.8 kg)   SpO2 97%   BMI 59.74 kg/m   Wt Readings from Last 3 Encounters:  05/02/18 (!) 359 lb (162.8 kg)  02/08/18 (!) 375 lb (170.1 kg)  11/01/17 (!) 378 lb (171.5 kg)    Physical Exam  Constitutional: She is oriented to person, place, and time. She appears well-developed and well-nourished.  HENT:  Head: Normocephalic and atraumatic.  Eyes: Conjunctivae and EOM are normal.  Neck: Normal range of motion.  Cardiovascular: Normal rate, regular rhythm and normal heart sounds.  Pulmonary/Chest: Effort normal and breath sounds normal.  Musculoskeletal: Normal range of motion.  Neurological: She is alert and oriented to person, place, and time.  Skin: No erythema.  Psychiatric: She has a normal mood and affect. Her behavior is normal. Judgment and thought content normal.    Results for orders placed or performed in visit on 10/26/17  Urinalysis, Routine w reflex microscopic  Result Value Ref Range   Specific Gravity, UA 1.015 1.005 - 1.030   pH, UA 7.0 5.0 - 7.5   Color, UA Yellow Yellow   Appearance Ur Hazy (A) Clear   Leukocytes, UA Negative Negative   Protein, UA Negative Negative/Trace   Glucose, UA Negative Negative   Ketones, UA Negative Negative   RBC, UA Negative Negative   Bilirubin, UA Negative Negative   Urobilinogen, Ur 0.2 0.2 - 1.0 mg/dL   Nitrite, UA Negative Negative  TSH  Result Value Ref Range   TSH 1.910 0.450 - 4.500 uIU/mL  CBC with Differential  Result Value Ref Range   WBC 6.7 3.4 - 10.8 x10E3/uL   RBC 4.69 3.77 - 5.28 x10E6/uL   Hemoglobin 13.6 11.1 - 15.9 g/dL   Hematocrit 42.3 34.0 - 46.6 %   MCV 90 79 - 97 fL   MCH 29.0 26.6 - 33.0 pg   MCHC 32.2 31.5 - 35.7 g/dL   RDW 14.8 12.3 - 15.4 %   Platelets 268 150 - 379 x10E3/uL   Neutrophils 59 Not Estab. %   Lymphs 29 Not Estab. %   Monocytes 9 Not Estab. %   Eos 3 Not Estab. %   Basos 0 Not Estab. %   Neutrophils Absolute 3.9 1.4 - 7.0 x10E3/uL   Lymphocytes Absolute 2.0 0.7 - 3.1 x10E3/uL   Monocytes Absolute 0.6 0.1 - 0.9  x10E3/uL   EOS (ABSOLUTE) 0.2 0.0 - 0.4 x10E3/uL   Basophils Absolute 0.0 0.0 - 0.2 x10E3/uL   Immature Granulocytes 0 Not Estab. %   Immature Grans (Abs) 0.0 0.0 - 0.1 x10E3/uL  Comp Met (CMET)  Result Value Ref Range   Glucose 111 (H) 65 - 99 mg/dL   BUN 10 8 - 27 mg/dL   Creatinine, Ser 1.13 (H) 0.57 - 1.00 mg/dL   GFR calc non Af Amer 52 (L) >59 mL/min/1.73   GFR calc Af Amer 60 >59 mL/min/1.73   BUN/Creatinine Ratio 9 (L) 12 - 28   Sodium 139 134 - 144 mmol/L   Potassium 4.2 3.5 - 5.2 mmol/L   Chloride 98 96 - 106 mmol/L   CO2 30 (H) 20 - 29 mmol/L   Calcium 9.2 8.7 - 10.3 mg/dL   Total Protein 7.4 6.0 - 8.5 g/dL   Albumin 4.1 3.6 - 4.8 g/dL   Globulin, Total 3.3 1.5 - 4.5 g/dL   Albumin/Globulin Ratio 1.2 1.2 - 2.2   Bilirubin Total 0.4 0.0 - 1.2 mg/dL   Alkaline Phosphatase 72 39 - 117 IU/L   AST 15 0 - 40 IU/L   ALT 17 0 - 32 IU/L  Hepatitis C antibody  screen  Result Value Ref Range   Hep C Virus Ab <0.1 0.0 - 0.9 s/co ratio  HIV antibody  Result Value Ref Range   HIV Screen 4th Generation wRfx Non Reactive Non Reactive      Assessment & Plan:   Problem List Items Addressed This Visit      Cardiovascular and Mediastinum   CAD (coronary artery disease)    The current medical regimen is effective;  continue present plan and medications.         Genitourinary   Hypertensive kidney disease with CKD stage III ([place]) - Primary   Relevant Orders   Basic metabolic panel     Other   Hyperlipidemia (Chronic)    The current medical regimen is effective;  continue present plan and medications.       Morbid obesity ([place])    Doing well with 16 pound weight loss patient will continue working.          Follow up plan: Return in about 6 months (around 11/01/2018) for Physical Exam.

## 2018-05-03 LAB — BASIC METABOLIC PANEL
BUN/Creatinine Ratio: 13 (ref 12–28)
BUN: 15 mg/dL (ref 8–27)
CALCIUM: 9.4 mg/dL (ref 8.7–10.3)
CHLORIDE: 98 mmol/L (ref 96–106)
CO2: 27 mmol/L (ref 20–29)
Creatinine, Ser: 1.12 mg/dL — ABNORMAL HIGH (ref 0.57–1.00)
GFR calc Af Amer: 61 mL/min/{1.73_m2} (ref >59)
GFR calc non Af Amer: 53 mL/min/{1.73_m2} — ABNORMAL LOW (ref >59)
GLUCOSE: 108 mg/dL — AB (ref 65–99)
Potassium: 3.8 mmol/L (ref 3.5–5.2)
Sodium: 139 mmol/L (ref 134–144)

## 2018-06-11 ENCOUNTER — Ambulatory Visit
Admission: RE | Admit: 2018-06-11 | Discharge: 2018-06-11 | Disposition: A | Discharge status: Home / Self Care | Payer: Medicare Other | Source: Ambulatory Visit | Attending: Family Medicine | Admitting: Family Medicine

## 2018-06-11 DIAGNOSIS — Z1231 Encounter for screening mammogram for malignant neoplasm of breast: Secondary | ICD-10-CM | POA: Diagnosis not present

## 2018-06-12 ENCOUNTER — Encounter: Payer: Self-pay | Admitting: Family Medicine

## 2018-09-27 ENCOUNTER — Telehealth: Payer: Self-pay | Admitting: Family Medicine

## 2018-09-27 DIAGNOSIS — I10 Essential (primary) hypertension: Secondary | ICD-10-CM

## 2018-09-27 MED ORDER — POTASSIUM CHLORIDE 20 MEQ PO PACK: 20.0000 meq | PACK | Freq: Two times a day (BID) | ORAL | 0 refills | Status: DC

## 2018-09-27 NOTE — Telephone Encounter (Signed)
Rx sent 

## 2018-09-27 NOTE — Telephone Encounter (Signed)
Pharmacy states that pt is wanting refill on potassium chloride 20 meq and their system is not recognizing patient's provider please send to [facility]

## 2018-10-22 ENCOUNTER — Encounter: Payer: Self-pay | Admitting: Family Medicine

## 2018-11-01 ENCOUNTER — Ambulatory Visit (INDEPENDENT_AMBULATORY_CARE_PROVIDER_SITE_OTHER): Payer: Medicare Other

## 2018-11-01 VITALS — BP 140/72 | HR 92 | Temp 97.5°F | Ht 65.0 in | Wt 348.0 lb

## 2018-11-01 DIAGNOSIS — Z0000 Encounter for general adult medical examination without abnormal findings: Secondary | ICD-10-CM | POA: Diagnosis not present

## 2018-11-01 DIAGNOSIS — R350 Frequency of micturition: Secondary | ICD-10-CM | POA: Diagnosis not present

## 2018-11-01 LAB — MICROSCOPIC EXAMINATION

## 2018-11-01 LAB — URINALYSIS, ROUTINE W REFLEX MICROSCOPIC
Bilirubin, UA: NEGATIVE
GLUCOSE, UA: NEGATIVE
Ketones, UA: NEGATIVE
Leukocytes, UA: NEGATIVE
NITRITE UA: NEGATIVE
PROTEIN UA: NEGATIVE
SPEC GRAV UA: 1.02 (ref 1.005–1.030)
UUROB: 0.2 mg/dL (ref 0.2–1.0)
pH, UA: 5.5 (ref 5.0–7.5)

## 2018-11-01 NOTE — Patient Instructions (Signed)
Mary Webb , Thank you for taking time to come for your Medicare Wellness Visit. I appreciate your ongoing commitment to your health goals. Please review the following plan we discussed and let me know if I can assist you in the future.   Screening recommendations/referrals: Colonoscopy up to date, due 2/21/027 Mammogram up to date, due 06/11/2020 Bone Density up to date Recommended yearly ophthalmology/optometry visit for glaucoma screening and checkup Recommended yearly dental visit for hygiene and checkup  Vaccinations: Influenza vaccine excluded Pneumococcal vaccine excluded Tdap vaccine excluded Shingles vaccine excluded    Advanced directives: Please bring [place] a copy once this is completed  Conditions/risks identified: None  Next appointment: Dr. Jeananne Rama 12/20/2018 @ 9:30am                                  Medicare Wellness Visit 11/06/2019 @ 9:30am  Preventive Care 40-64 Years, Female Preventive care refers to lifestyle choices and visits with your health care provider that can promote health and wellness. What does preventive care include?  A yearly physical exam. This is also called an annual well check.  Dental exams once or twice a year.  Routine eye exams. Ask your health care provider how often you should have your eyes checked.  Personal lifestyle choices, including:  Daily care of your teeth and gums.  Regular physical activity.  Eating a healthy diet.  Avoiding tobacco and drug use.  Limiting alcohol use.  Practicing safe sex.  Taking low-dose aspirin daily starting at age 3.  Taking vitamin and mineral supplements as recommended by your health care provider. What happens during an annual well check? The services and screenings done by your health care provider during your annual well check will depend on your age, overall health, lifestyle risk factors, and family history of disease. Counseling  Your health care provider may ask you questions about  your:  Alcohol use.  Tobacco use.  Drug use.  Emotional well-being.  Home and relationship well-being.  Sexual activity.  Eating habits.  Work and work Statistician.  Method of birth control.  Menstrual cycle.  Pregnancy history. Screening  You may have the following tests or measurements:  Height, weight, and BMI.  Blood pressure.  Lipid and cholesterol levels. These may be checked every 5 years, or more frequently if you are over 40 years old.  Skin check.  Lung cancer screening. You may have this screening every year starting at age 68 if you have a 30-pack-year history of smoking and currently smoke or have quit within the past 15 years.  Fecal occult blood test (FOBT) of the stool. You may have this test every year starting at age 68.  Flexible sigmoidoscopy or colonoscopy. You may have a sigmoidoscopy every 5 years or a colonoscopy every 10 years starting at age 30.  Hepatitis C blood test.  Hepatitis B blood test.  Sexually transmitted disease (STD) testing.  Diabetes screening. This is done by checking your blood sugar (glucose) after you have not eaten for a while (fasting). You may have this done every 1-3 years.  Mammogram. This may be done every 1-2 years. Talk to your health care provider about when you should start having regular mammograms. This may depend on whether you have a family history of breast cancer.  BRCA-related cancer screening. This may be done if you have a family history of breast, ovarian, tubal, or peritoneal cancers.  Pelvic exam  and Pap test. This may be done every 3 years starting at age 33. Starting at age 2, this may be done every 5 years if you have a Pap test in combination with an HPV test.  Bone density scan. This is done to screen for osteoporosis. You may have this scan if you are at high risk for osteoporosis. Discuss your test results, treatment options, and if necessary, the need for more tests with your health care  provider. Vaccines  Your health care provider may recommend certain vaccines, such as:  Influenza vaccine. This is recommended every year.  Tetanus, diphtheria, and acellular pertussis (Tdap, Td) vaccine. You may need a Td booster every 10 years.  Zoster vaccine. You may need this after age 69.  Pneumococcal 13-valent conjugate (PCV13) vaccine. You may need this if you have certain conditions and were not previously vaccinated.  Pneumococcal polysaccharide (PPSV23) vaccine. You may need one or two doses if you smoke cigarettes or if you have certain conditions. Talk to your health care provider about which screenings and vaccines you need and how often you need them. This information is not intended to replace advice given to you by your health care provider. Make sure you discuss any questions you have with your health care provider. Document Released: 11/27/2015 Document Revised: 07/20/2016 Document Reviewed: 09/01/2015 Elsevier Interactive Patient Education  2017 [place] Prevention in the Home Falls can cause injuries. They can happen to people of all ages. There are many things you can do to make your home safe and to help prevent falls. What can I do on the outside of my home?  Regularly fix the edges of walkways and driveways and fix any cracks.  Remove anything that might make you trip as you walk through a door, such as a raised step or threshold.  Trim any bushes or trees on the path to your home.  Use bright outdoor lighting.  Clear any walking paths of anything that might make someone trip, such as rocks or tools.  Regularly check to see if handrails are loose or broken. Make sure that both sides of any steps have handrails.  Any raised decks and porches should have guardrails on the edges.  Have any leaves, snow, or ice cleared regularly.  Use sand or salt on walking paths during winter.  Clean up any spills in your garage right away. This includes  oil or grease spills. What can I do in the bathroom?  Use night lights.  Install grab bars by the toilet and in the tub and shower. Do not use towel bars as grab bars.  Use non-skid mats or decals in the tub or shower.  If you need to sit down in the shower, use a plastic, non-slip stool.  Keep the floor dry. Clean up any water that spills on the floor as soon as it happens.  Remove soap buildup in the tub or shower regularly.  Attach bath mats securely with double-sided non-slip rug tape.  Do not have throw rugs and other things on the floor that can make you trip. What can I do in the bedroom?  Use night lights.  Make sure that you have a light by your bed that is easy to reach.  Do not use any sheets or blankets that are too big for your bed. They should not hang down onto the floor.  Have a firm chair that has side arms. You can use this for support while  you get dressed.  Do not have throw rugs and other things on the floor that can make you trip. What can I do in the kitchen?  Clean up any spills right away.  Avoid walking on wet floors.  Keep items that you use a lot in easy-to-reach places.  If you need to reach something above you, use a strong step stool that has a grab bar.  Keep electrical cords out of the way.  Do not use floor polish or wax that makes floors slippery. If you must use wax, use non-skid floor wax.  Do not have throw rugs and other things on the floor that can make you trip. What can I do with my stairs?  Do not leave any items on the stairs.  Make sure that there are handrails on both sides of the stairs and use them. Fix handrails that are broken or loose. Make sure that handrails are as long as the stairways.  Check any carpeting to make sure that it is firmly attached to the stairs. Fix any carpet that is loose or worn.  Avoid having throw rugs at the top or bottom of the stairs. If you do have throw rugs, attach them to the floor  with carpet tape.  Make sure that you have a light switch at the top of the stairs and the bottom of the stairs. If you do not have them, ask someone to add them for you. What else can I do to help prevent falls?  Wear shoes that:  Do not have high heels.  Have rubber bottoms.  Are comfortable and fit you well.  Are closed at the toe. Do not wear sandals.  If you use a stepladder:  Make sure that it is fully opened. Do not climb a closed stepladder.  Make sure that both sides of the stepladder are locked into place.  Ask someone to hold it for you, if possible.  Clearly mark and make sure that you can see:  Any grab bars or handrails.  First and last steps.  Where the edge of each step is.  Use tools that help you move around (mobility aids) if they are needed. These include:  Canes.  Walkers.  Scooters.  Crutches.  Turn on the lights when you go into a dark area. Replace any light bulbs as soon as they burn out.  Set up your furniture so you have a clear path. Avoid moving your furniture around.  If any of your floors are uneven, fix them.  If there are any pets around you, be aware of where they are.  Review your medicines with your doctor. Some medicines can make you feel dizzy. This can increase your chance of falling. Ask your doctor what other things that you can do to help prevent falls. This information is not intended to replace advice given to you by your health care provider. Make sure you discuss any questions you have with your health care provider. Document Released: 08/27/2009 Document Revised: 04/07/2016 Document Reviewed: 12/05/2014 Elsevier Interactive Patient Education  2017 [employer].

## 2018-11-01 NOTE — Progress Notes (Signed)
Subjective:   Mary Webb is a 62 y.o. female who presents for Medicare Annual (Subsequent) preventive examination.       Objective:     Vitals: BP 140/72 (BP Location: Right Arm, Patient Position: Sitting)   Pulse 92   Temp (!) 97.5 F (36.4 C) (Oral)   Ht 5\' 5"  (1.651 m)   Wt (!) 348 lb (157.9 kg)   SpO2 96%   BMI 57.91 kg/m   Body mass index is 57.91 kg/m.  Advanced Directives 11/01/2018 10/26/2017  Does Patient Have a Medical Advance Directive? No No  Would patient like information on creating a medical advance directive? Yes (MAU/Ambulatory/Procedural Areas - Information given) Yes (MAU/Ambulatory/Procedural Areas - Information given)    Tobacco Social History   Tobacco Use  Smoking Status Never Smoker  Smokeless Tobacco Never Used     Counseling given: Not Answered   Clinical Intake:  Pre-visit preparation completed: No  Pain : No/denies pain     Nutritional Risks: None Diabetes: No  How often do you need to have someone help you when you read instructions, pamphlets, or other written materials from your doctor or pharmacy?: 1 - Never What is the last grade level you completed in school?: Some college  Interpreter Needed?: No  Information entered by :: Mary Dense, RN  Past Medical History:  Diagnosis Date  . Arthritis   . CAD (coronary artery disease)   . CKD (chronic kidney disease)   . CVA (cerebral infarction)    L sided  . Dysrhythmia   . Hyperlipidemia   . Lymphadenopathy   . Pituitary adenoma ([place])   . Presence of permanent cardiac pacemaker   . Recurrent depression ([place])   . Shortness of breath dyspnea   . Sick sinus syndrome ([place])   . Stroke [facility])    Past Surgical History:  Procedure Laterality Date  . ABDOMINAL HYSTERECTOMY    . COLONOSCOPY WITH PROPOFOL N/A 01/05/2016   Procedure: COLONOSCOPY WITH PROPOFOL;  Surgeon: Mary Lame, MD;  Location: [facility] ENDOSCOPY;  Service: Endoscopy;  Laterality: N/A;  . PACEMAKER  INSERTION  2015   Dr. Josefa Webb   Family History  Problem Relation Age of Onset  . Hypertension Mother   . Hypertension Father   . Diabetes Father        under control  . Hypertension Maternal Grandmother   . Depression Maternal Grandmother   . Hypertension Maternal Grandfather   . Hypertension Paternal Grandmother   . Hypertension Paternal Grandfather   . Thyroid disease Maternal Aunt    Social History   Socioeconomic History  . Marital status: Married    Spouse name: Not on file  . Number of children: Not on file  . Years of education: Not on file  . Highest education level: Not on file  Occupational History  . Not on file  Social Needs  . Financial resource strain: Not hard at all  . Food insecurity:    Worry: Never true    Inability: Never true  . Transportation needs:    Medical: No    Non-medical: No  Tobacco Use  . Smoking status: Never Smoker  . Smokeless tobacco: Never Used  Substance and Sexual Activity  . Alcohol use: No  . Drug use: No  . Sexual activity: Not on file  Lifestyle  . Physical activity:    Days per week: 3 days    Minutes per session: 10 min  . Stress: Only a little  Relationships  .  Social connections:    Talks on phone: More than three times a week    Gets together: More than three times a week    Attends religious service: More than 4 times per year    Active member of club or organization: No    Attends meetings of clubs or organizations: Never    Relationship status: Married  Other Topics Concern  . Not on file  Social History Narrative  . Not on file    Outpatient Encounter Medications as of 11/01/2018  Medication Sig  . Ascorbic Acid (VITAMIN C) 1000 MG tablet Take by mouth.  . chlorthalidone (HYGROTON) 25 MG tablet Take 1 tablet (25 mg total) by mouth daily.  . Cholecalciferol (VITAMIN D3) 1000 units CAPS Take by mouth. Taking once a week  . clopidogrel (PLAVIX) 75 MG tablet Take 1 tablet (75 mg total) by mouth daily.    . Cyanocobalamin (B-12) 5000 MCG CAPS Take 5,000 Units by mouth once a week.  . losartan (COZAAR) 100 MG tablet Take 1 tablet (100 mg total) by mouth daily.  . potassium chloride (KLOR-CON) 20 MEQ packet Take 20 mEq by mouth 2 (two) times daily.  Marland Kitchen pyridOXINE (VITAMIN B-6) 25 MG tablet Take by mouth.   No facility-administered encounter medications on file as of 11/01/2018.     Activities of Daily Living In your present state of health, do you have any difficulty performing the following activities: 11/01/2018  Hearing? N  Vision? N  Difficulty concentrating or making decisions? N  Walking or climbing stairs? Y  Dressing or bathing? N  Doing errands, shopping? N  Preparing Food and eating ? N  Using the Toilet? N  In the past six months, have you accidently leaked urine? Y  Do you have problems with loss of bowel control? N  Managing your Medications? N  Managing your Finances? N  Housekeeping or managing your Housekeeping? N  Some recent data might be hidden    Patient Care Team: Mary Maple, MD as PCP - General (Family Medicine) Mary Kida, MD as Consulting Physician (Cardiology)    Assessment:   This is a routine wellness examination for Mary Webb.  Exercise Activities and Dietary recommendations Current Exercise Habits: Home exercise routine, Type of exercise: stretching, Time (Minutes): 10, Frequency (Times/Week): 3, Weekly Exercise (Minutes/Week): 30, Intensity: Mild, Exercise limited by: orthopedic condition(s)  Goals    . DIET - INCREASE WATER INTAKE     Recommend drinking at least 6-7 glasses of water a day        Fall Risk Fall Risk  11/01/2018 05/02/2018 02/08/2018 11/01/2017 10/26/2017  Falls in the past year? 0 No No Yes Yes  Number falls in past yr: - - - 2 or more 2 or more  Injury with Fall? - - - No No  Follow up - - - - Falls prevention discussed   Is the patient's home free of loose throw rugs in walkways, pet beds, electrical cords,  etc?   yes      Grab bars in the bathroom? no      Handrails on the stairs?   yes      Adequate lighting?   yes  Depression Screen PHQ 2/9 Scores 11/01/2018 05/02/2018 10/26/2017 05/23/2017  PHQ - 2 Score 0 0 3 0  PHQ- 9 Score - - 7 -     Cognitive Function     6CIT Screen 11/01/2018 10/26/2017  What Year? 0 points 0 points  What month?  0 points 0 points  What time? 0 points 0 points  Count back from 20 0 points 0 points  Months in reverse 0 points 0 points  Repeat phrase 0 points 2 points  Total Score 0 2    Immunization History  Administered Date(s) Administered  . Pneumococcal Conjugate-13 06/03/2014  . Pneumococcal Polysaccharide-23 08/07/2007  . Td 07/18/2006    Qualifies for Shingles Vaccine? Excluded due to allery  Screening Tests Health Maintenance  Topic Date Due  . MAMMOGRAM  06/11/2020  . COLONOSCOPY  01/04/2026  . Hepatitis C Screening  Completed  . HIV Screening  Completed  . INFLUENZA VACCINE  Discontinued  . TETANUS/TDAP  Discontinued    Cancer Screenings: Lung: Low Dose CT Chest recommended if Age 90-80 years, 30 pack-year currently smoking OR have quit w/in 15years. Patient does not qualify. Breast:  Up to date on Mammogram? Yes   Up to date of Bone Density/Dexa? Yes Colorectal: up to date  Additional Screenings:  Hepatitis C Screening: declined TDAP due: excluded due to allergy Pneumonia vaccine due: excluded due to allergy Influenza vaccine due: excluded due to allergy     Plan:    I have personally reviewed and addressed the Medicare Annual Wellness questionnaire and have noted the following in the patient's chart:  A. Medical and social history B. Use of alcohol, tobacco or illicit drugs  C. Current medications and supplements D. Functional ability and status E.  Nutritional status F.  Physical activity G. Advance directives H. List of other physicians I.  Hospitalizations, surgeries, and ER visits in previous 12 months J.   [place] to include hearing, vision, cognitive, depression L. Referrals and appointments - none  In addition, I have reviewed and discussed with patient certain preventive protocols, quality metrics, and best practice recommendations. A written personalized care plan for preventive services as well as general preventive health recommendations were provided to patient.  See attached scanned questionnaire for additional information.   Signed,   Mary Dense, RN Nurse Health Advisor  Patient Concerns: Low back pain, frequency, some ammonia odor in urine for the last couple months. Provider notified and U/A ordered

## 2018-11-08 ENCOUNTER — Telehealth: Payer: Self-pay

## 2018-11-08 NOTE — Telephone Encounter (Signed)
Reviewing list of patients w/ DM and cardiovascular disease w/o statin. Do not see anything about why patient is not on a statin medication. Please review and decide if appropriate to start statin.

## 2018-11-08 NOTE — Telephone Encounter (Signed)
Attempted to reach patient to see if she would be agreeable to start statin if provider thought appropriate. Left message on machine for pt to return call to the office.

## 2018-11-09 ENCOUNTER — Other Ambulatory Visit: Payer: Self-pay | Admitting: Family Medicine

## 2018-11-09 DIAGNOSIS — I6322 Cerebral infarction due to unspecified occlusion or stenosis of basilar arteries: Secondary | ICD-10-CM

## 2018-11-09 DIAGNOSIS — I495 Sick sinus syndrome: Secondary | ICD-10-CM

## 2018-11-09 NOTE — Telephone Encounter (Signed)
Patient with no diagnosis of diabetes

## 2018-11-09 NOTE — Telephone Encounter (Signed)
What about the cardiovascular disease, the gap is for diabetes and cardiovascular.

## 2018-11-12 NOTE — Telephone Encounter (Signed)
Requested medication (s) are due for refill today: Yes  Requested medication (s) are on the active medication list: Yes  Last refill:  11/01/17  Future visit scheduled: Yes  Notes to clinic:  Prescription expired and protocol looking for labs.    Requested Prescriptions  Pending Prescriptions Disp Refills   clopidogrel (PLAVIX) 75 MG tablet [Pharmacy Med Name: CLOPIDOGREL 75 MG TABLET] 90 tablet 0    Sig: Take 1 tablet (75 mg total) by mouth daily.     Hematology: Antiplatelets - clopidogrel Failed - 11/09/2018  2:49 PM      Failed - Evaluate AST, ALT within 2 months of therapy initiation.      Failed - ALT in normal range and within 360 days    ALT  Date Value Ref Range Status  10/26/2017 17 0 - 32 IU/L Final   SGPT (ALT)  Date Value Ref Range Status  07/12/2014 38 U/L Final    Comment:    14-63 NOTE: New Reference Range 06/03/14    ALT (SGPT) [place], [place]  Date Value Ref Range Status  05/23/2017 23 10 - 47 U/L Final         Failed - AST in normal range and within 360 days    AST  Date Value Ref Range Status  10/26/2017 15 0 - 40 IU/L Final   SGOT(AST)  Date Value Ref Range Status  07/12/2014 28 15 - 37 Unit/L Final   AST (SGOT) [place], Waived  Date Value Ref Range Status  05/23/2017 45 (H) 11 - 38 U/L Final         Failed - HCT in normal range and within 180 days    Hematocrit  Date Value Ref Range Status  10/26/2017 42.3 34.0 - 46.6 % Final         Failed - HGB in normal range and within 180 days    Hemoglobin  Date Value Ref Range Status  10/26/2017 13.6 11.1 - 15.9 g/dL Final         Failed - PLT in normal range and within 180 days    Platelets  Date Value Ref Range Status  10/26/2017 268 150 - 379 x10E3/uL Final         Passed - Valid encounter within last 6 months    Recent Outpatient Visits          6 months ago 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

## 2018-11-22 ENCOUNTER — Encounter: Payer: Medicare Other | Admitting: Family Medicine

## 2018-12-20 ENCOUNTER — Ambulatory Visit (INDEPENDENT_AMBULATORY_CARE_PROVIDER_SITE_OTHER): Payer: Medicare Other | Admitting: Family Medicine

## 2018-12-20 ENCOUNTER — Encounter: Payer: Self-pay | Admitting: Family Medicine

## 2018-12-20 VITALS — BP 151/75 | HR 82 | Temp 98.0°F | Ht 62.5 in | Wt 375.0 lb

## 2018-12-20 DIAGNOSIS — Z1329 Encounter for screening for other suspected endocrine disorder: Secondary | ICD-10-CM

## 2018-12-20 DIAGNOSIS — I495 Sick sinus syndrome: Secondary | ICD-10-CM

## 2018-12-20 DIAGNOSIS — I129 Hypertensive chronic kidney disease with stage 1 through stage 4 chronic kidney disease, or unspecified chronic kidney disease: Secondary | ICD-10-CM

## 2018-12-20 DIAGNOSIS — I6322 Cerebral infarction due to unspecified occlusion or stenosis of basilar arteries: Secondary | ICD-10-CM

## 2018-12-20 DIAGNOSIS — Z7189 Other specified counseling: Secondary | ICD-10-CM

## 2018-12-20 DIAGNOSIS — D352 Benign neoplasm of pituitary gland: Secondary | ICD-10-CM

## 2018-12-20 DIAGNOSIS — I10 Essential (primary) hypertension: Secondary | ICD-10-CM

## 2018-12-20 DIAGNOSIS — N183 Chronic kidney disease, stage 3 unspecified: Secondary | ICD-10-CM

## 2018-12-20 DIAGNOSIS — E7800 Pure hypercholesterolemia, unspecified: Secondary | ICD-10-CM

## 2018-12-20 LAB — URINALYSIS, ROUTINE W REFLEX MICROSCOPIC
BILIRUBIN UA: NEGATIVE
Glucose, UA: NEGATIVE
Ketones, UA: NEGATIVE
Leukocytes, UA: NEGATIVE
Nitrite, UA: NEGATIVE
PH UA: 6 (ref 5.0–7.5)
Protein, UA: NEGATIVE
RBC, UA: NEGATIVE
Specific Gravity, UA: 1.025 (ref 1.005–1.030)
Urobilinogen, Ur: 0.2 mg/dL (ref 0.2–1.0)

## 2018-12-20 MED ORDER — CHLORTHALIDONE 25 MG PO TABS: 25.0000 mg | ORAL_TABLET | Freq: Every day | ORAL | 4 refills | Status: DC

## 2018-12-20 MED ORDER — POTASSIUM CHLORIDE 20 MEQ PO PACK: 20.0000 meq | PACK | Freq: Two times a day (BID) | ORAL | 4 refills | Status: DC

## 2018-12-20 MED ORDER — LOSARTAN POTASSIUM 100 MG PO TABS: 100.0000 mg | ORAL_TABLET | Freq: Every day | ORAL | 4 refills | Status: DC

## 2018-12-20 MED ORDER — CLOPIDOGREL BISULFATE 75 MG PO TABS: 75.0000 mg | ORAL_TABLET | Freq: Every day | ORAL | 4 refills | Status: DC

## 2018-12-20 NOTE — Assessment & Plan Note (Signed)
Discussed wt and diet

## 2018-12-20 NOTE — Assessment & Plan Note (Signed)
No visual changes 

## 2018-12-20 NOTE — Assessment & Plan Note (Signed)
Has a pacemaker which is monitored on a regular basis

## 2018-12-20 NOTE — Assessment & Plan Note (Signed)
A voluntary discussion about advanced care planning including explanation and discussion of advanced directives was extentively discussed with the patient.  Explained about the healthcare proxy and living will was reviewed and packet with forms with expiration of how to fill them out was given.  Time spent: Encounter 16+ min individuals present: Patient 

## 2018-12-20 NOTE — Assessment & Plan Note (Signed)
stable °

## 2018-12-20 NOTE — Assessment & Plan Note (Signed)
The current medical regimen is effective;  continue present plan and medications.  

## 2018-12-20 NOTE — Progress Notes (Signed)
BP (!) 151/75   Pulse 82   Temp 98 F (36.7 C) (Oral)   Ht 5' 2.5" (1.588 m)   Wt (!) 375 lb (170.1 kg)   SpO2 94%   BMI 67.50 kg/m    Subjective:    Patient ID: Mary Webb, female    DOB: 1956-10-08, 63 y.o.   MRN: [id number]  HPI: Mary Webb is a 63 y.o. female  Chief Complaint  Patient presents with  . Annual Exam  . Shoulder Pain    Right, Same.   Shoulder is bothersome but wants to leave well enough alone right now takes occasional Tylenol. Patient also is getting ready to be bothered by allergies with allergy season just starting discussed allergy care and treatment does not like nose sprays so we will stick with Claritin or Allegra.   Relevant past medical, surgical, family and social history reviewed and updated as indicated. Interim medical history since our last visit reviewed. Allergies and medications reviewed and updated.  Review of Systems  Constitutional: Negative.   HENT: Negative.   Eyes: Negative.   Respiratory: Negative.   Cardiovascular: Negative.   Gastrointestinal: Negative.   Endocrine: Negative.   Genitourinary: Negative.   Musculoskeletal: Negative.   Skin: Negative.   Allergic/Immunologic: Negative.   Neurological: Negative.   Hematological: Negative.   Psychiatric/Behavioral: Negative.     Per HPI unless specifically indicated above     Objective:    BP (!) 151/75   Pulse 82   Temp 98 F (36.7 C) (Oral)   Ht 5' 2.5" (1.588 m)   Wt (!) 375 lb (170.1 kg)   SpO2 94%   BMI 67.50 kg/m   Wt Readings from Last 3 Encounters:  12/20/18 (!) 375 lb (170.1 kg)  11/01/18 (!) 348 lb (157.9 kg)  05/02/18 (!) 359 lb (162.8 kg)    Physical Exam Exam conducted with a chaperone present.  Constitutional:      Appearance: She is well-developed.  HENT:     Head: Normocephalic and atraumatic.     Right Ear: External ear normal.     Left Ear: External ear normal.     Nose: Nose normal.  Eyes:     Conjunctiva/sclera:  Conjunctivae normal.     Pupils: Pupils are equal, round, and reactive to light.  Neck:     Musculoskeletal: Normal range of motion and neck supple.     Vascular: No carotid bruit.  Cardiovascular:     Rate and Rhythm: Normal rate and regular rhythm.     Heart sounds: Normal heart sounds. No murmur.  Pulmonary:     Effort: Pulmonary effort is normal.     Breath sounds: Normal breath sounds.  Chest:     Chest wall: No mass.     Breasts: Breasts are symmetrical.        Right: No mass, skin change or tenderness.        Left: No mass, skin change or tenderness.  Abdominal:     General: Bowel sounds are normal.     Palpations: Abdomen is soft.  Musculoskeletal: Normal range of motion.  Skin:    Findings: No rash.  Neurological:     Mental Status: She is alert and oriented to person, place, and time.  Psychiatric:        Behavior: Behavior normal.        Thought Content: Thought content normal.        Judgment: Judgment normal.  Results for orders placed or performed in visit on 11/01/18  Microscopic Examination  Result Value Ref Range   WBC, UA 0-5 0 - 5 /hpf   RBC, UA 0-2 0 - 2 /hpf   Epithelial Cells (non renal) 0-10 0 - 10 /hpf   Mucus, UA Present (A) Not Estab.   Bacteria, UA Many (A) None seen/Few  Urinalysis, Routine w reflex microscopic  Result Value Ref Range   Specific Gravity, UA 1.020 1.005 - 1.030   pH, UA 5.5 5.0 - 7.5   Color, UA Yellow Yellow   Appearance Ur Cloudy (A) Clear   Leukocytes, UA Negative Negative   Protein, UA Negative Negative/Trace   Glucose, UA Negative Negative   Ketones, UA Negative Negative   RBC, UA Trace (A) Negative   Bilirubin, UA Negative Negative   Urobilinogen, Ur 0.2 0.2 - 1.0 mg/dL   Nitrite, UA Negative Negative   Microscopic Examination See below:       Assessment & Plan:   Problem List Items Addressed This Visit      Cardiovascular and Mediastinum   Sick sinus syndrome (HCC) (Chronic)    Has a pacemaker which is  monitored on a regular basis        Endocrine   Pituitary adenoma (HCC) (Chronic)    No visual changes        Nervous and Auditory   Cerebral infarction ([place])    stable        Genitourinary   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       Relevant Orders   CBC with Differential/Platelet   Comprehensive metabolic panel   Urinalysis, Routine w reflex microscopic     Other   Hyperlipidemia (Chronic)   Relevant Orders   CBC with Differential/Platelet   Comprehensive metabolic panel   Lipid panel   Urinalysis, Routine w reflex microscopic   Morbid obesity (HCC)    Discussed wt and diet      Advanced care planning/counseling discussion    A voluntary discussion about advanced care planning including explanation and discussion of advanced directives was extentively discussed with the patient.  Explained about the healthcare proxy and living will was reviewed and packet with forms with expiration of how to fill them out was given.  Time spent: Encounter 16+ min individuals present: Patient       Other Visit Diagnoses    Essential hypertension       Relevant Orders   CBC with Differential/Platelet   Comprehensive metabolic panel   Urinalysis, Routine w reflex microscopic   Thyroid disorder screening       Relevant Orders   TSH       Follow up plan: Return in about 6 months (around 06/20/2019).

## 2018-12-21 LAB — CBC WITH DIFFERENTIAL/PLATELET
Basophils Absolute: 0 10*3/uL (ref 0.0–0.2)
Basos: 1 %
EOS (ABSOLUTE): 0.1 10*3/uL (ref 0.0–0.4)
Eos: 2 %
Hematocrit: 41.1 % (ref 34.0–46.6)
Hemoglobin: 13.4 g/dL (ref 11.1–15.9)
IMMATURE GRANULOCYTES: 0 %
Immature Grans (Abs): 0 10*3/uL (ref 0.0–0.1)
Lymphocytes Absolute: 2 10*3/uL (ref 0.7–3.1)
Lymphs: 27 %
MCH: 28.8 pg (ref 26.6–33.0)
MCHC: 32.6 g/dL (ref 31.5–35.7)
MCV: 88 fL (ref 79–97)
MONOCYTES: 11 %
Monocytes Absolute: 0.8 10*3/uL (ref 0.1–0.9)
Neutrophils Absolute: 4.4 10*3/uL (ref 1.4–7.0)
Neutrophils: 59 %
Platelets: 316 10*3/uL (ref 150–450)
RBC: 4.65 x10E6/uL (ref 3.77–5.28)
RDW: 13.4 % (ref 11.7–15.4)
WBC: 7.4 10*3/uL (ref 3.4–10.8)

## 2018-12-21 LAB — COMPREHENSIVE METABOLIC PANEL
ALBUMIN: 4 g/dL (ref 3.8–4.8)
ALT: 21 IU/L (ref 0–32)
AST: 18 IU/L (ref 0–40)
Albumin/Globulin Ratio: 1.2 (ref 1.2–2.2)
Alkaline Phosphatase: 76 IU/L (ref 39–117)
BUN/Creatinine Ratio: 15 (ref 12–28)
BUN: 16 mg/dL (ref 8–27)
Bilirubin Total: 0.3 mg/dL (ref 0.0–1.2)
CO2: 28 mmol/L (ref 20–29)
Calcium: 9.2 mg/dL (ref 8.7–10.3)
Chloride: 98 mmol/L (ref 96–106)
Creatinine, Ser: 1.1 mg/dL — ABNORMAL HIGH (ref 0.57–1.00)
GFR calc Af Amer: 62 mL/min/{1.73_m2} (ref >59)
GFR, EST NON AFRICAN AMERICAN: 54 mL/min/{1.73_m2} — AB (ref >59)
Globulin, Total: 3.4 g/dL (ref 1.5–4.5)
Glucose: 111 mg/dL — ABNORMAL HIGH (ref 65–99)
Potassium: 4.1 mmol/L (ref 3.5–5.2)
Sodium: 139 mmol/L (ref 134–144)
Total Protein: 7.4 g/dL (ref 6.0–8.5)

## 2018-12-21 LAB — LIPID PANEL
CHOL/HDL RATIO: 3.1 ratio (ref 0.0–4.4)
Cholesterol, Total: 152 mg/dL (ref 100–199)
HDL: 49 mg/dL (ref >39)
LDL Calculated: 87 mg/dL (ref 0–99)
Triglycerides: 81 mg/dL (ref 0–149)
VLDL Cholesterol Cal: 16 mg/dL (ref 5–40)

## 2018-12-21 LAB — TSH: TSH: 2.72 u[IU]/mL (ref 0.450–4.500)

## 2018-12-24 ENCOUNTER — Encounter: Payer: Self-pay | Admitting: Family Medicine

## 2019-06-14 ENCOUNTER — Telehealth: Payer: Self-pay | Admitting: Family Medicine

## 2019-06-14 NOTE — Telephone Encounter (Signed)
Called pt to reschedule appt due to provider being off no answer no vm

## 2019-06-20 ENCOUNTER — Ambulatory Visit: Payer: Medicare Other | Admitting: Family Medicine

## 2019-07-03 ENCOUNTER — Other Ambulatory Visit: Payer: Self-pay | Admitting: Family Medicine

## 2019-07-03 MED ORDER — ROSUVASTATIN CALCIUM 20 MG PO TABS: 20.0000 mg | ORAL_TABLET | Freq: Every day | ORAL | 3 refills | Status: DC

## 2019-09-13 ENCOUNTER — Telehealth: Payer: Self-pay

## 2019-09-13 NOTE — Telephone Encounter (Signed)
Patient notified that paperwork is ready for pick up Copied from [place] [phone]. Topic: General - Other >> Sep 12, 2019  2:33 PM Wynetta Emery, [place] C wrote: Reason for CRM: pt called in stating that she is returning the office call, not showing a note in chart. Pt say that spouse dropped off a form for completion yesterday.

## 2019-10-08 ENCOUNTER — Telehealth: Payer: Self-pay | Admitting: Family Medicine

## 2019-10-08 NOTE — Chronic Care Management (AMB) (Signed)
Chronic Care Management   Note  10/08/2019 Name: NEFERTARI REBMAN MRN: [id number] DOB: May 04, 1956  LAURETTE VILLESCAS is a 63 y.o. year old female who is a primary care patient of Crissman, Jeannette How, MD. I reached out to [name] by phone today in response to a referral sent by Ms. Lorriane Shire A Hoen's health plan.     Ms. Schuman was given information about Chronic Care Management services today including:  1. CCM service includes personalized support from designated clinical staff supervised by her physician, including individualized plan of care and coordination with other care providers 2. 24/7 contact phone numbers for assistance for urgent and routine care needs. 3. Service will only be billed when office clinical staff spend 20 minutes or more in a month to coordinate care. 4. Only one practitioner may furnish and bill the service in a calendar month. 5. The patient may stop CCM services at any time (effective at the end of the month) by phone call to the office staff. 6. The patient will be responsible for cost sharing (co-pay) of up to 20% of the service fee (after annual deductible is met).  Patient agreed to services and verbal consent obtained.   Follow up plan: Telephone appointment with CCM team member scheduled for: 11/20/2019  [facility]  [place], [place] [address] Direct Dial: [email or web address]  Website: [email or web address]

## 2019-10-22 DIAGNOSIS — I480 Paroxysmal atrial fibrillation: Secondary | ICD-10-CM | POA: Diagnosis not present

## 2019-11-06 ENCOUNTER — Ambulatory Visit (INDEPENDENT_AMBULATORY_CARE_PROVIDER_SITE_OTHER): Payer: Medicare Other

## 2019-11-06 VITALS — Ht 63.0 in | Wt 360.0 lb

## 2019-11-06 DIAGNOSIS — Z0000 Encounter for general adult medical examination without abnormal findings: Secondary | ICD-10-CM | POA: Diagnosis not present

## 2019-11-06 NOTE — Progress Notes (Signed)
Subjective:   Mary Webb is a 63 y.o. female who presents for Medicare Annual (Subsequent) preventive examination.  This visit is being conducted via phone call  - after an attmept to do on video chat - due to the COVID-19 pandemic. This patient has given me verbal consent via phone to conduct this visit, patient states they are participating from their home address. Some vital signs may be absent or patient reported.   Patient identification: identified by name, DOB, and current address.    Review of Systems:   Cardiac Risk Factors include: advanced age (>58men, >34 women);dyslipidemia;hypertension     Objective:     Vitals: Ht 5\' 3"  (1.6 m)   Wt (!) 360 lb (163.3 kg)   BMI 63.77 kg/m   Body mass index is 63.77 kg/m.  Advanced Directives 11/06/2019 11/01/2018 10/26/2017  Does Patient Have a Medical Advance Directive? No No No  Would patient like information on creating a medical advance directive? Yes (MAU/Ambulatory/Procedural Areas - Information given) Yes (MAU/Ambulatory/Procedural Areas - Information given) Yes (MAU/Ambulatory/Procedural Areas - Information given)    Tobacco Social History   Tobacco Use  Smoking Status Never Smoker  Smokeless Tobacco Never Used     Counseling given: Not Answered   Clinical Intake:  Pre-visit preparation completed: Yes  Pain : No/denies pain     Nutritional Status: BMI > 30  Obese Nutritional Risks: None Diabetes: No           Past Medical History:  Diagnosis Date  . Arthritis   . CAD (coronary artery disease)   . CKD (chronic kidney disease)   . CVA (cerebral infarction)    L sided  . Dysrhythmia   . Hyperlipidemia   . Lymphadenopathy   . Pituitary adenoma ([place])   . Presence of permanent cardiac pacemaker   . Recurrent depression ([place])   . Shortness of breath dyspnea   . Sick sinus syndrome ([place])   . Stroke [facility])    Past Surgical History:  Procedure Laterality Date  . ABDOMINAL HYSTERECTOMY      . COLONOSCOPY WITH PROPOFOL N/A 01/05/2016   Procedure: COLONOSCOPY WITH PROPOFOL;  Surgeon: Lucilla Lame, MD;  Location: [facility] ENDOSCOPY;  Service: Endoscopy;  Laterality: N/A;  . PACEMAKER INSERTION  2015   Dr. Josefa Half   Family History  Problem Relation Age of Onset  . Hypertension Mother   . Hypertension Father   . Diabetes Father        under control  . Hypertension Maternal Grandmother   . Depression Maternal Grandmother   . Hypertension Maternal Grandfather   . Hypertension Paternal Grandmother   . Hypertension Paternal Grandfather   . Thyroid disease Maternal Aunt    Social History   Socioeconomic History  . Marital status: Married    Spouse name: Not on file  . Number of children: Not on file  . Years of education: Not on file  . Highest education level: Not on file  Occupational History  . Not on file  Tobacco Use  . Smoking status: Never Smoker  . Smokeless tobacco: Never Used  Substance and Sexual Activity  . Alcohol use: No  . Drug use: No  . Sexual activity: Not on file  Other Topics Concern  . Not on file  Social History Narrative  . Not on file   Social Determinants of Health   Financial Resource Strain:   . Difficulty of Paying Living Expenses: Not on file  Food Insecurity:   .  Worried About Charity fundraiser in the Last Year: Not on file  . Ran Out of Food in the Last Year: Not on file  Transportation Needs:   . Lack of Transportation (Medical): Not on file  . Lack of Transportation (Non-Medical): Not on file  Physical Activity:   . Days of Exercise per Week: Not on file  . Minutes of Exercise per Session: Not on file  Stress:   . Feeling of Stress : Not on file  Social Connections:   . Frequency of Communication with Friends and Family: Not on file  . Frequency of Social Gatherings with Friends and Family: Not on file  . Attends Religious Services: Not on file  . Active Member of Clubs or Organizations: Not on file  . Attends Theatre manager Meetings: Not on file  . Marital Status: Not on file    Outpatient Encounter Medications as of 11/06/2019  Medication Sig  . Ascorbic Acid (VITAMIN C) 1000 MG tablet Take by mouth.  . chlorthalidone (HYGROTON) 25 MG tablet Take 1 tablet (25 mg total) by mouth daily.  . Cholecalciferol (VITAMIN D3) 1000 units CAPS Take by mouth. Taking once a week  . clopidogrel (PLAVIX) 75 MG tablet Take 1 tablet (75 mg total) by mouth daily.  . Cyanocobalamin (B-12) 5000 MCG CAPS Take 5,000 Units by mouth once a week.  . losartan (COZAAR) 100 MG tablet Take 1 tablet (100 mg total) by mouth daily.  . potassium chloride (KLOR-CON) 20 MEQ packet Take 20 mEq by mouth 2 (two) times daily.  Marland Kitchen pyridOXINE (VITAMIN B-6) 25 MG tablet Take by mouth.  . [DISCONTINUED] rosuvastatin (CRESTOR) 20 MG tablet Take 1 tablet (20 mg total) by mouth daily. (Patient not taking: Reported on 11/06/2019)   No facility-administered encounter medications on file as of 11/06/2019.    Activities of Daily Living In your present state of health, do you have any difficulty performing the following activities: 11/06/2019 12/20/2018  Hearing? N N  Comment no hearing aids -  Vision? N N  Comment reading glasses, dr.woodard -  Difficulty concentrating or making decisions? N N  Walking or climbing stairs? N Y  Dressing or bathing? N N  Doing errands, shopping? N N  Preparing Food and eating ? N -  Using the Toilet? N -  In the past six months, have you accidently leaked urine? N -  Do you have problems with loss of bowel control? N -  Managing your Medications? N -  Managing your Finances? N -  Housekeeping or managing your Housekeeping? N -  Some recent data might be hidden    Patient Care Team: Guadalupe Maple, MD as PCP - General (Family Medicine) Yolonda Kida, MD as Consulting Physician (Cardiology) Minor, Dalbert Garnet, RN as [facility]    Assessment:   This is a routine  wellness examination for Mary Webb.  Exercise Activities and Dietary recommendations Current Exercise Habits: The patient does not participate in regular exercise at present, Exercise limited by: None identified  Goals    . DIET - INCREASE WATER INTAKE     Recommend drinking at least 6-7 glasses of water a day        Fall Risk: Fall Risk  11/06/2019 11/01/2018 05/02/2018 02/08/2018 11/01/2017  Falls in the past year? 1 0 No No Yes  Number falls in past yr: 0 - - - 2 or more  Injury with Fall? 0 - - - No  Follow up - - - - -    FALL RISK PREVENTION PERTAINING TO THE HOME:  Any stairs in or around the home? No  If so, are there any without handrails? No   Home free of loose throw rugs in walkways, pet beds, electrical cords, etc? Yes  Adequate lighting in your home to reduce risk of falls? Yes   ASSISTIVE DEVICES UTILIZED TO PREVENT FALLS:  Life alert? No  Use of a cane, walker or w/c? Yes  cane as needed  Grab bars in the bathroom? No  Shower chair or bench in shower? No  Elevated toilet seat or a handicapped toilet? No   DME ORDERS:  DME order needed?  No   TIMED UP AND GO:  Unable to perform    Depression Screen PHQ 2/9 Scores 11/06/2019 11/06/2019 12/20/2018 11/01/2018  PHQ - 2 Score 0 0 2 0  PHQ- 9 Score - - 3 -     Cognitive Function     6CIT Screen 11/01/2018 10/26/2017  What Year? 0 points 0 points  What month? 0 points 0 points  What time? 0 points 0 points  Count back from 20 0 points 0 points  Months in reverse 0 points 0 points  Repeat phrase 0 points 2 points  Total Score 0 2    Immunization History  Administered Date(s) Administered  . Pneumococcal Conjugate-13 06/03/2014  . Pneumococcal Polysaccharide-23 08/07/2007  . Td 07/18/2006    Qualifies for Shingles Vaccine? Yes  Zostavax completed n/a. Due for Shingrix. Education has been provided regarding the importance of this vaccine. Pt has been advised to call insurance company to determine  out of pocket expense. Advised may also receive vaccine at local pharmacy or Health Dept. Verbalized acceptance and understanding.  Tdap: declined   Flu Vaccine:   Pneumococcal Vaccine: up to date   Screening Tests Health Maintenance  Topic Date Due  . MAMMOGRAM  06/11/2020  . COLONOSCOPY  01/04/2026  . Hepatitis C Screening  Completed  . HIV Screening  Completed  . INFLUENZA VACCINE  Discontinued  . TETANUS/TDAP  Discontinued    Cancer Screenings:  Colorectal Screening: Completed 01/05/2016. Repeat every 10 years  Mammogram: Completed 06/11/2018.   Bone Density: not indicated   Lung Cancer Screening: (Low Dose CT Chest recommended if Age 70-80 years, 30 pack-year currently smoking OR have quit w/in 15years.) does not qualify.    Additional Screening:  Hepatitis C Screening: does qualify; Completed 10/26/2017  Vision Screening: Recommended annual ophthalmology exams for early detection of glaucoma and other disorders of the eye. Is the patient up to date with their annual eye exam?  Yes  Who is the provider or what is the name of the office in which the pt attends annual eye exams? Dr.Woodard   Dental Screening: Recommended annual dental exams for proper oral hygiene  Community Resource Referral:  CRR required this visit?  No       Plan:  I have personally reviewed and addressed the Medicare Annual Wellness questionnaire and have noted the following in the patient's chart:  A. Medical and social history B. Use of alcohol, tobacco or illicit drugs  C. Current medications and supplements D. Functional ability and status E.  Nutritional status F.  Physical activity G. Advance directives H. List of other physicians I.  Hospitalizations, surgeries, and ER visits in previous 12 months J.  [place] such as hearing and vision if needed, cognitive and depression L. Referrals and appointments  In addition, I have reviewed and discussed with patient  certain preventive protocols, quality metrics, and best practice recommendations. A written personalized care plan for preventive services as well as general preventive health recommendations were provided to patient.  Signed,    Bevelyn Ngo, LPN  [id number] Nurse Health Advisor   Nurse Notes: none

## 2019-11-06 NOTE — Patient Instructions (Signed)
Mary Webb , Thank you for taking time to come for your Medicare Wellness Visit. I appreciate your ongoing commitment to your health goals. Please review the following plan we discussed and let me know if I can assist you in the future.   Screening recommendations/referrals: Colonoscopy: completed 2017 Mammogram: completed 05/2018 Bone Density: not indicated  Recommended yearly ophthalmology/optometry visit for glaucoma screening and checkup Recommended yearly dental visit for hygiene and checkup  Vaccinations: Influenza vaccine: declined  Pneumococcal vaccine: up to date  Tdap vaccine: declined  Shingles vaccine: shingrix eligible  Advanced directives: Advance directive discussed with you today. Once this is complete please bring a copy in to our office so we can scan it into your chart.  Conditions/risks identified: Recommend exercising at least 3 times a week for at least 30 minutes.   Next appointment: Follow up in one year for your annual wellness visit.   Preventive Care 40-64 Years, Female Preventive care refers to lifestyle choices and visits with your health care provider that can promote health and wellness. What does preventive care include?  A yearly physical exam. This is also called an annual well check.  Dental exams once or twice a year.  Routine eye exams. Ask your health care provider how often you should have your eyes checked.  Personal lifestyle choices, including:  Daily care of your teeth and gums.  Regular physical activity.  Eating a healthy diet.  Avoiding tobacco and drug use.  Limiting alcohol use.  Practicing safe sex.  Taking low-dose aspirin daily starting at age 65.  Taking vitamin and mineral supplements as recommended by your health care provider. What happens during an annual well check? The services and screenings done by your health care provider during your annual well check will depend on your age, overall health, lifestyle risk  factors, and family history of disease. Counseling  Your health care provider may ask you questions about your:  Alcohol use.  Tobacco use.  Drug use.  Emotional well-being.  Home and relationship well-being.  Sexual activity.  Eating habits.  Work and work Statistician.  Method of birth control.  Menstrual cycle.  Pregnancy history. Screening  You may have the following tests or measurements:  Height, weight, and BMI.  Blood pressure.  Lipid and cholesterol levels. These may be checked every 5 years, or more frequently if you are over 44 years old.  Skin check.  Lung cancer screening. You may have this screening every year starting at age 51 if you have a 30-pack-year history of smoking and currently smoke or have quit within the past 15 years.  Fecal occult blood test (FOBT) of the stool. You may have this test every year starting at age 33.  Flexible sigmoidoscopy or colonoscopy. You may have a sigmoidoscopy every 5 years or a colonoscopy every 10 years starting at age 58.  Hepatitis C blood test.  Hepatitis B blood test.  Sexually transmitted disease (STD) testing.  Diabetes screening. This is done by checking your blood sugar (glucose) after you have not eaten for a while (fasting). You may have this done every 1-3 years.  Mammogram. This may be done every 1-2 years. Talk to your health care provider about when you should start having regular mammograms. This may depend on whether you have a family history of breast cancer.  BRCA-related cancer screening. This may be done if you have a family history of breast, ovarian, tubal, or peritoneal cancers.  Pelvic exam and Pap test. This may  be done every 3 years starting at age 17. Starting at age 66, this may be done every 5 years if you have a Pap test in combination with an HPV test.  Bone density scan. This is done to screen for osteoporosis. You may have this scan if you are at high risk for  osteoporosis. Discuss your test results, treatment options, and if necessary, the need for more tests with your health care provider. Vaccines  Your health care provider may recommend certain vaccines, such as:  Influenza vaccine. This is recommended every year.  Tetanus, diphtheria, and acellular pertussis (Tdap, Td) vaccine. You may need a Td booster every 10 years.  Zoster vaccine. You may need this after age 51.  Pneumococcal 13-valent conjugate (PCV13) vaccine. You may need this if you have certain conditions and were not previously vaccinated.  Pneumococcal polysaccharide (PPSV23) vaccine. You may need one or two doses if you smoke cigarettes or if you have certain conditions. Talk to your health care provider about which screenings and vaccines you need and how often you need them. This information is not intended to replace advice given to you by your health care provider. Make sure you discuss any questions you have with your health care provider. Document Released: 11/27/2015 Document Revised: 07/20/2016 Document Reviewed: 09/01/2015 Elsevier Interactive Patient Education  2017 [employer] Prevention in the Home Falls can cause injuries. They can happen to people of all ages. There are many things you can do to make your home safe and to help prevent falls. What can I do on the outside of my home?  Regularly fix the edges of walkways and driveways and fix any cracks.  Remove anything that might make you trip as you walk through a door, such as a raised step or threshold.  Trim any bushes or trees on the path to your home.  Use bright outdoor lighting.  Clear any walking paths of anything that might make someone trip, such as rocks or tools.  Regularly check to see if handrails are loose or broken. Make sure that both sides of any steps have handrails.  Any raised decks and porches should have guardrails on the edges.  Have any leaves, snow, or ice cleared  regularly.  Use sand or salt on walking paths during winter.  Clean up any spills in your garage right away. This includes oil or grease spills. What can I do in the bathroom?  Use night lights.  Install grab bars by the toilet and in the tub and shower. Do not use towel bars as grab bars.  Use non-skid mats or decals in the tub or shower.  If you need to sit down in the shower, use a plastic, non-slip stool.  Keep the floor dry. Clean up any water that spills on the floor as soon as it happens.  Remove soap buildup in the tub or shower regularly.  Attach bath mats securely with double-sided non-slip rug tape.  Do not have throw rugs and other things on the floor that can make you trip. What can I do in the bedroom?  Use night lights.  Make sure that you have a light by your bed that is easy to reach.  Do not use any sheets or blankets that are too big for your bed. They should not hang down onto the floor.  Have a firm chair that has side arms. You can use this for support while you get dressed.  Do  not have throw rugs and other things on the floor that can make you trip. What can I do in the kitchen?  Clean up any spills right away.  Avoid walking on wet floors.  Keep items that you use a lot in easy-to-reach places.  If you need to reach something above you, use a strong step stool that has a grab bar.  Keep electrical cords out of the way.  Do not use floor polish or wax that makes floors slippery. If you must use wax, use non-skid floor wax.  Do not have throw rugs and other things on the floor that can make you trip. What can I do with my stairs?  Do not leave any items on the stairs.  Make sure that there are handrails on both sides of the stairs and use them. Fix handrails that are broken or loose. Make sure that handrails are as long as the stairways.  Check any carpeting to make sure that it is firmly attached to the stairs. Fix any carpet that is loose  or worn.  Avoid having throw rugs at the top or bottom of the stairs. If you do have throw rugs, attach them to the floor with carpet tape.  Make sure that you have a light switch at the top of the stairs and the bottom of the stairs. If you do not have them, ask someone to add them for you. What else can I do to help prevent falls?  Wear shoes that:  Do not have high heels.  Have rubber bottoms.  Are comfortable and fit you well.  Are closed at the toe. Do not wear sandals.  If you use a stepladder:  Make sure that it is fully opened. Do not climb a closed stepladder.  Make sure that both sides of the stepladder are locked into place.  Ask someone to hold it for you, if possible.  Clearly mark and make sure that you can see:  Any grab bars or handrails.  First and last steps.  Where the edge of each step is.  Use tools that help you move around (mobility aids) if they are needed. These include:  Canes.  Walkers.  Scooters.  Crutches.  Turn on the lights when you go into a dark area. Replace any light bulbs as soon as they burn out.  Set up your furniture so you have a clear path. Avoid moving your furniture around.  If any of your floors are uneven, fix them.  If there are any pets around you, be aware of where they are.  Review your medicines with your doctor. Some medicines can make you feel dizzy. This can increase your chance of falling. Ask your doctor what other things that you can do to help prevent falls. This information is not intended to replace advice given to you by your health care provider. Make sure you discuss any questions you have with your health care provider. Document Released: 08/27/2009 Document Revised: 04/07/2016 Document Reviewed: 12/05/2014 Elsevier Interactive Patient Education  2017 [employer].

## 2019-11-12 ENCOUNTER — Other Ambulatory Visit: Payer: Self-pay

## 2019-11-12 DIAGNOSIS — I495 Sick sinus syndrome: Secondary | ICD-10-CM

## 2019-11-12 DIAGNOSIS — I6322 Cerebral infarction due to unspecified occlusion or stenosis of basilar arteries: Secondary | ICD-10-CM

## 2019-11-12 MED ORDER — CLOPIDOGREL BISULFATE 75 MG PO TABS: 75.0000 mg | ORAL_TABLET | Freq: Every day | ORAL | 0 refills | Status: DC

## 2019-11-12 NOTE — Telephone Encounter (Signed)
Please send in under new provider

## 2019-11-19 ENCOUNTER — Ambulatory Visit (INDEPENDENT_AMBULATORY_CARE_PROVIDER_SITE_OTHER): Payer: Medicare Other | Admitting: Family Medicine

## 2019-11-19 ENCOUNTER — Encounter: Payer: Self-pay | Admitting: Family Medicine

## 2019-11-19 ENCOUNTER — Other Ambulatory Visit: Payer: Self-pay

## 2019-11-19 ENCOUNTER — Telehealth: Payer: Self-pay | Admitting: Family Medicine

## 2019-11-19 NOTE — Chronic Care Management (AMB) (Signed)
  Care Management   Note  11/19/2019 Name: Mary Webb MRN: [id number] DOB: Mar 06, 1956  Mary Webb is a 64 y.o. year old female who is a primary care patient of Crissman, Jeannette How, MD and is actively engaged with the care management team. I reached out to [name] by phone today to assist with re-scheduling a initial outreach appointment with the RN Case Manager  Follow up plan: Telephone appointment with care management team member scheduled for: 12/24/2019  [place] [place] [facility]  [place], [place] [address] Direct Dial: [email or web address]  Website: [email or web address]

## 2019-11-20 ENCOUNTER — Telehealth: Payer: Medicare Other

## 2019-11-20 NOTE — Progress Notes (Signed)
Patient was rescheduled for in person visit due to lack of access to vitals for follow up

## 2019-12-04 ENCOUNTER — Encounter: Payer: Self-pay | Admitting: Family Medicine

## 2019-12-04 ENCOUNTER — Ambulatory Visit (INDEPENDENT_AMBULATORY_CARE_PROVIDER_SITE_OTHER): Payer: Medicare Other | Admitting: Family Medicine

## 2019-12-04 ENCOUNTER — Other Ambulatory Visit: Payer: Self-pay

## 2019-12-04 VITALS — BP 127/78 | HR 69 | Temp 98.4°F | Ht 62.0 in | Wt 379.0 lb

## 2019-12-04 DIAGNOSIS — I129 Hypertensive chronic kidney disease with stage 1 through stage 4 chronic kidney disease, or unspecified chronic kidney disease: Secondary | ICD-10-CM

## 2019-12-04 DIAGNOSIS — I495 Sick sinus syndrome: Secondary | ICD-10-CM | POA: Diagnosis not present

## 2019-12-04 DIAGNOSIS — Z0000 Encounter for general adult medical examination without abnormal findings: Secondary | ICD-10-CM

## 2019-12-04 DIAGNOSIS — Z8673 Personal history of transient ischemic attack (TIA), and cerebral infarction without residual deficits: Secondary | ICD-10-CM

## 2019-12-04 DIAGNOSIS — I6322 Cerebral infarction due to unspecified occlusion or stenosis of basilar arteries: Secondary | ICD-10-CM

## 2019-12-04 DIAGNOSIS — E7800 Pure hypercholesterolemia, unspecified: Secondary | ICD-10-CM

## 2019-12-04 DIAGNOSIS — N183 Chronic kidney disease, stage 3 unspecified: Secondary | ICD-10-CM | POA: Diagnosis not present

## 2019-12-04 DIAGNOSIS — I10 Essential (primary) hypertension: Secondary | ICD-10-CM

## 2019-12-04 DIAGNOSIS — Z1231 Encounter for screening mammogram for malignant neoplasm of breast: Secondary | ICD-10-CM

## 2019-12-04 LAB — UA/M W/RFLX CULTURE, ROUTINE
Bilirubin, UA: NEGATIVE
Glucose, UA: NEGATIVE
Ketones, UA: NEGATIVE
Leukocytes,UA: NEGATIVE
Nitrite, UA: NEGATIVE
Protein,UA: NEGATIVE
RBC, UA: NEGATIVE
Specific Gravity, UA: 1.02 (ref 1.005–1.030)
Urobilinogen, Ur: 2 mg/dL — ABNORMAL HIGH (ref 0.2–1.0)
pH, UA: 6.5 (ref 5.0–7.5)

## 2019-12-04 MED ORDER — LOSARTAN POTASSIUM 100 MG PO TABS: 100.0000 mg | ORAL_TABLET | Freq: Every day | ORAL | 1 refills | Status: DC

## 2019-12-04 MED ORDER — POTASSIUM CHLORIDE 20 MEQ PO PACK: 20.0000 meq | PACK | Freq: Two times a day (BID) | ORAL | 1 refills | Status: DC

## 2019-12-04 MED ORDER — CLOPIDOGREL BISULFATE 75 MG PO TABS: 75.0000 mg | ORAL_TABLET | Freq: Every day | ORAL | 0 refills | Status: DC

## 2019-12-04 MED ORDER — CHLORTHALIDONE 25 MG PO TABS: 25.0000 mg | ORAL_TABLET | Freq: Every day | ORAL | 1 refills | Status: DC

## 2019-12-04 NOTE — Progress Notes (Signed)
BP 127/78   Pulse 69   Temp 98.4 F (36.9 C) (Oral)   Ht 5\' 2"  (1.575 m)   Wt (!) 379 lb (171.9 kg)   SpO2 98%   BMI 69.32 kg/m    Subjective:    Patient ID: Mary Webb, female    DOB: 1956/10/03, 64 y.o.   MRN: [id number]  HPI: Mary Webb is a 64 y.o. female presenting on 12/04/2019 for comprehensive medical examination. Current medical complaints include:see below  Does not check home BPs but has ordered a BP cuff and plans to start doing so soon. Followed closely by Cardiology for HTN, atrial fibrillation, and pacemaker checks. Currently feeling very well, no CP, palpitations, SOB, syncopal episodes. Taking medications faithfully without side effects.   Hx of CVA. On plavix and controlling BPs closely. Not taking cholesterol medication. Does have hx of HLD.   Obesity - trying to improve diet and exercise, motivated to lose weight for chronic condition improvement.   She currently lives with: Menopausal Symptoms: no  Depression Screen done today and results listed below:  Depression screen [facility] 2/9 12/04/2019 11/06/2019 11/06/2019 12/20/2018 11/01/2018  Decreased Interest 0 0 0 1 0  Down, Depressed, Hopeless 0 0 0 1 0  PHQ - 2 Score 0 0 0 2 0  Altered sleeping 0 - - 0 -  Tired, decreased energy 0 - - 0 -  Change in appetite 0 - - 0 -  Feeling bad or failure about yourself  0 - - 1 -  Trouble concentrating 0 - - 0 -  Moving slowly or fidgety/restless 0 - - 0 -  Suicidal thoughts 0 - - 0 -  PHQ-9 Score 0 - - 3 -  Difficult doing work/chores - - - - -    The patient does not have a history of falls. I did complete a risk assessment for falls. A plan of care for falls was not documented.   Past Medical History:  Past Medical History:  Diagnosis Date  . Arthritis   . CAD (coronary artery disease)   . CKD (chronic kidney disease)   . CVA (cerebral infarction)    L sided  . Dysrhythmia   . Hyperlipidemia   . Lymphadenopathy   . Pituitary adenoma ([place])   .  Presence of permanent cardiac pacemaker   . Recurrent depression ([place])   . Shortness of breath dyspnea   . Sick sinus syndrome ([place])   . Stroke [facility])     Surgical History:  Past Surgical History:  Procedure Laterality Date  . ABDOMINAL HYSTERECTOMY    . COLONOSCOPY WITH PROPOFOL N/A 01/05/2016   Procedure: COLONOSCOPY WITH PROPOFOL;  Surgeon: Lucilla Lame, MD;  Location: [facility] ENDOSCOPY;  Service: Endoscopy;  Laterality: N/A;  . PACEMAKER INSERTION  2015   Dr. Josefa Half    Medications:  Current Outpatient Medications on File Prior to Visit  Medication Sig  . Ascorbic Acid (VITAMIN C) 1000 MG tablet Take by mouth. Every other day  . Cholecalciferol (VITAMIN D3) 1000 units CAPS Take by mouth. Taking once a week  . Cyanocobalamin (VITAMIN B 12) 500 MCG TABS Take by mouth daily.  Marland Kitchen pyridOXINE (VITAMIN B-6) 25 MG tablet Take by mouth daily.    No current facility-administered medications on file prior to visit.    Allergies:  Allergies  Allergen Reactions  . Levaquin [Levofloxacin] Other (See Comments)    Stroke  . Sulfur Anaphylaxis  . Tekturna [Aliskiren] Shortness Of Breath  .  Accupril [Quinapril Hcl]   . Aspirin   . Diovan [Valsartan]   . Erythromycin   . Iodine   . Latex     Can't remember reaction   . Lotensin [Benazepril]   . Norvasc [Amlodipine]   . Shellfish Allergy Swelling  . Sulfa Antibiotics   . Tetanus Toxoids   . Tetracycline     Other reaction(s): Unknown  . Tetracyclines & Related   . Penicillins Rash    Social History:  Social History   Socioeconomic History  . Marital status: Married    Spouse name: Not on file  . Number of children: Not on file  . Years of education: Not on file  . Highest education level: Not on file  Occupational History  . Not on file  Tobacco Use  . Smoking status: Never Smoker  . Smokeless tobacco: Never Used  Substance and Sexual Activity  . Alcohol use: No  . Drug use: No  . Sexual activity: Not on file  Other  Topics Concern  . Not on file  Social History Narrative  . Not on file   Social Determinants of Health   Financial Resource Strain:   . Difficulty of Paying Living Expenses: Not on file  Food Insecurity:   . Worried About Charity fundraiser in the Last Year: Not on file  . Ran Out of Food in the Last Year: Not on file  Transportation Needs:   . Lack of Transportation (Medical): Not on file  . Lack of Transportation (Non-Medical): Not on file  Physical Activity:   . Days of Exercise per Week: Not on file  . Minutes of Exercise per Session: Not on file  Stress:   . Feeling of Stress : Not on file  Social Connections:   . Frequency of Communication with Friends and Family: Not on file  . Frequency of Social Gatherings with Friends and Family: Not on file  . Attends Religious Services: Not on file  . Active Member of Clubs or Organizations: Not on file  . Attends Archivist Meetings: Not on file  . Marital Status: Not on file  Intimate Partner Violence:   . Fear of Current or Ex-Partner: Not on file  . Emotionally Abused: Not on file  . Physically Abused: Not on file  . Sexually Abused: Not on file   Social History   Tobacco Use  Smoking Status Never Smoker  Smokeless Tobacco Never Used   Social History   Substance and Sexual Activity  Alcohol Use No    Family History:  Family History  Problem Relation Age of Onset  . Hypertension Mother   . Hypertension Father   . Diabetes Father        under control  . Hypertension Maternal Grandmother   . Depression Maternal Grandmother   . Hypertension Maternal Grandfather   . Hypertension Paternal Grandmother   . Hypertension Paternal Grandfather   . Thyroid disease Maternal Aunt     Past medical history, surgical history, medications, allergies, family history and social history reviewed with patient today and changes made to appropriate areas of the chart.   Review of Systems - General ROS:  negative Psychological ROS: negative Ophthalmic ROS: negative ENT ROS: negative Allergy and Immunology ROS: negative Hematological and Lymphatic ROS: negative Endocrine ROS: negative Breast ROS: negative for breast lumps Respiratory ROS: no cough, shortness of breath, or wheezing Cardiovascular ROS: no chest pain or dyspnea on exertion Gastrointestinal ROS: no abdominal pain, change in  bowel habits, or black or bloody stools Genito-Urinary ROS: no dysuria, trouble voiding, or hematuria Musculoskeletal ROS: negative Neurological ROS: no TIA or stroke symptoms All other ROS negative except what is listed above and in the HPI.      Objective:    BP 127/78   Pulse 69   Temp 98.4 F (36.9 C) (Oral)   Ht 5\' 2"  (1.575 m)   Wt (!) 379 lb (171.9 kg)   SpO2 98%   BMI 69.32 kg/m   Wt Readings from Last 3 Encounters:  12/04/19 (!) 379 lb (171.9 kg)  11/06/19 (!) 360 lb (163.3 kg)  12/20/18 (!) 375 lb (170.1 kg)    Physical Exam Vitals and nursing note reviewed.  Constitutional:      General: She is not in acute distress.    Appearance: She is well-developed.  HENT:     Head: Atraumatic.     Right Ear: External ear normal.     Left Ear: External ear normal.     Nose: Nose normal.     Mouth/Throat:     Pharynx: No oropharyngeal exudate.  Eyes:     General: No scleral icterus.    Conjunctiva/sclera: Conjunctivae normal.     Pupils: Pupils are equal, round, and reactive to light.  Neck:     Thyroid: No thyromegaly.  Cardiovascular:     Rate and Rhythm: Normal rate and regular rhythm.     Heart sounds: Normal heart sounds.  Pulmonary:     Effort: Pulmonary effort is normal. No respiratory distress.     Breath sounds: Normal breath sounds.  Chest:     Breasts:        Right: No mass, skin change or tenderness.        Left: No mass, skin change or tenderness.  Abdominal:     General: Bowel sounds are normal.     Palpations: Abdomen is soft. There is no mass.      Tenderness: There is no abdominal tenderness.  Genitourinary:    Comments: GU exam declined Musculoskeletal:        General: No tenderness. Normal range of motion.     Cervical back: Normal range of motion and neck supple.  Lymphadenopathy:     Cervical: No cervical adenopathy.     Upper Body:     Right upper body: No axillary adenopathy.     Left upper body: No axillary adenopathy.  Skin:    General: Skin is warm and dry.     Findings: No rash.  Neurological:     Mental Status: She is alert and oriented to person, place, and time.     Cranial Nerves: No cranial nerve deficit.  Psychiatric:        Behavior: Behavior normal.     Results for orders placed or performed in visit on 12/04/19  CBC with Differential/Platelet  Result Value Ref Range   WBC 6.0 3.4 - 10.8 x10E3/uL   RBC 4.82 3.77 - 5.28 x10E6/uL   Hemoglobin 14.2 11.1 - 15.9 g/dL   Hematocrit 42.9 34.0 - 46.6 %   MCV 89 79 - 97 fL   MCH 29.5 26.6 - 33.0 pg   MCHC 33.1 31.5 - 35.7 g/dL   RDW 13.4 11.7 - 15.4 %   Platelets 282 150 - 450 x10E3/uL   Neutrophils 57 Not Estab. %   Lymphs 30 Not Estab. %   Monocytes 10 Not Estab. %   Eos 2 Not Estab. %  Basos 1 Not Estab. %   Neutrophils Absolute 3.4 1.4 - 7.0 x10E3/uL   Lymphocytes Absolute 1.8 0.7 - 3.1 x10E3/uL   Monocytes Absolute 0.6 0.1 - 0.9 x10E3/uL   EOS (ABSOLUTE) 0.1 0.0 - 0.4 x10E3/uL   Basophils Absolute 0.0 0.0 - 0.2 x10E3/uL   Immature Granulocytes 0 Not Estab. %   Immature Grans (Abs) 0.0 0.0 - 0.1 x10E3/uL  Comprehensive metabolic panel  Result Value Ref Range   Glucose 103 (H) 65 - 99 mg/dL   BUN 12 8 - 27 mg/dL   Creatinine, Ser 0.99 0.57 - 1.00 mg/dL   GFR calc non Af Amer 60 >59 mL/min/1.73   GFR calc Af Amer 70 >59 mL/min/1.73   BUN/Creatinine Ratio 12 12 - 28   Sodium 136 134 - 144 mmol/L   Potassium 3.7 3.5 - 5.2 mmol/L   Chloride 97 96 - 106 mmol/L   CO2 24 20 - 29 mmol/L   Calcium 9.4 8.7 - 10.3 mg/dL   Total Protein 7.8 6.0 - 8.5  g/dL   Albumin 4.2 3.8 - 4.8 g/dL   Globulin, Total 3.6 1.5 - 4.5 g/dL   Albumin/Globulin Ratio 1.2 1.2 - 2.2   Bilirubin Total 0.6 0.0 - 1.2 mg/dL   Alkaline Phosphatase 78 39 - 117 IU/L   AST 19 0 - 40 IU/L   ALT 16 0 - 32 IU/L  Lipid Panel w/o Chol/HDL Ratio  Result Value Ref Range   Cholesterol, Total 163 100 - 199 mg/dL   Triglycerides 94 0 - 149 mg/dL   HDL 50 >39 mg/dL   VLDL Cholesterol Cal 17 5 - 40 mg/dL   LDL Chol Calc (NIH) 96 0 - 99 mg/dL  UA/M w/rflx Culture, Routine   Specimen: Urine   URINE  Result Value Ref Range   Specific Gravity, UA 1.020 1.005 - 1.030   pH, UA 6.5 5.0 - 7.5   Color, UA Yellow Yellow   Appearance Ur Hazy (A) Clear   Leukocytes,UA Negative Negative   Protein,UA Negative Negative/Trace   Glucose, UA Negative Negative   Ketones, UA Negative Negative   RBC, UA Negative Negative   Bilirubin, UA Negative Negative   Urobilinogen, Ur 2.0 (H) 0.2 - 1.0 mg/dL   Nitrite, UA Negative Negative      Assessment & Plan:   Problem List Items Addressed This Visit      Cardiovascular and Mediastinum   Sick sinus syndrome (HCC) (Chronic)    Followed by Cardiology, pacemaker in place. Continue per their recommendations      Relevant Medications   losartan (COZAAR) 100 MG tablet   clopidogrel (PLAVIX) 75 MG tablet   chlorthalidone (HYGROTON) 25 MG tablet     Genitourinary   Hypertensive kidney disease with CKD stage III - Primary    BPs stable and under good control, continue current regimen      Relevant Medications   potassium chloride (KLOR-CON) 20 MEQ packet   losartan (COZAAR) 100 MG tablet   chlorthalidone (HYGROTON) 25 MG tablet   Other Relevant Orders   CBC with Differential/Platelet (Completed)   Comprehensive metabolic panel (Completed)   UA/M w/rflx Culture, Routine (Completed)     Other   Hyperlipidemia (Chronic)    Recheck lipids, adjust as needed. Continue current regimen      Relevant Medications   losartan (COZAAR) 100  MG tablet   chlorthalidone (HYGROTON) 25 MG tablet   Other Relevant Orders   Lipid Panel w/o Chol/HDL Ratio (Completed)  History of CVA (cerebrovascular accident)    Stable, continue lifestyle modifications and current regimen      Relevant Medications   clopidogrel (PLAVIX) 75 MG tablet   Morbid obesity ([place])    Discussed diet and exercise, continue working toward weight loss       Other Visit Diagnoses    Annual physical exam       Encounter for screening mammogram for malignant neoplasm of breast       Relevant Orders   MM DIGITAL SCREENING BILATERAL   Essential hypertension       Relevant Medications   potassium chloride (KLOR-CON) 20 MEQ packet   losartan (COZAAR) 100 MG tablet   chlorthalidone (HYGROTON) 25 MG tablet   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   LABORATORY TESTING:  - Pap smear: not applicable  IMMUNIZATIONS:   - Tdap: Tetanus vaccination status reviewed: refused. - Influenza: Refused - Pneumovax: Not applicable - Prevnar: Not applicable - HPV: Not applicable - Zostavax vaccine: Refused  SCREENING: -Mammogram: Ordered today  - Colonoscopy: Up to date   PATIENT COUNSELING:   Advised to take 1 mg of folate supplement per day if capable of pregnancy.   Sexuality: Discussed sexually transmitted diseases, partner selection, use of condoms, avoidance of unintended pregnancy  and contraceptive alternatives.   Advised to avoid cigarette smoking.  I discussed with the patient that most people either abstain from alcohol or drink within safe limits (<=14/week and <=4 drinks/occasion for males, <=7/weeks and <= 3 drinks/occasion for females) and that the risk for alcohol disorders and other health effects rises proportionally with the number of drinks per week and how often a drinker  exceeds daily limits.  Discussed cessation/primary prevention of drug use and availability of treatment for abuse.   Diet: Encouraged to adjust caloric intake to maintain  or achieve ideal body weight, to reduce intake of dietary saturated fat and total fat, to limit sodium intake by avoiding high sodium foods and not adding table salt, and to maintain adequate dietary potassium and calcium preferably from fresh fruits, vegetables, and low-fat dairy products.    stressed the importance of regular exercise  Injury prevention: Discussed safety belts, safety helmets, smoke detector, smoking near bedding or upholstery.   Dental health: Discussed importance of regular tooth brushing, flossing, and dental visits.    NEXT PREVENTATIVE PHYSICAL DUE IN 1 YEAR. Return in about 6 months (around 06/02/2020) for 6 month f/u.

## 2019-12-05 LAB — CBC WITH DIFFERENTIAL/PLATELET
Basophils Absolute: 0 10*3/uL (ref 0.0–0.2)
Basos: 1 %
EOS (ABSOLUTE): 0.1 10*3/uL (ref 0.0–0.4)
Eos: 2 %
Hematocrit: 42.9 % (ref 34.0–46.6)
Hemoglobin: 14.2 g/dL (ref 11.1–15.9)
Immature Grans (Abs): 0 10*3/uL (ref 0.0–0.1)
Immature Granulocytes: 0 %
Lymphocytes Absolute: 1.8 10*3/uL (ref 0.7–3.1)
Lymphs: 30 %
MCH: 29.5 pg (ref 26.6–33.0)
MCHC: 33.1 g/dL (ref 31.5–35.7)
MCV: 89 fL (ref 79–97)
Monocytes Absolute: 0.6 10*3/uL (ref 0.1–0.9)
Monocytes: 10 %
Neutrophils Absolute: 3.4 10*3/uL (ref 1.4–7.0)
Neutrophils: 57 %
Platelets: 282 10*3/uL (ref 150–450)
RBC: 4.82 x10E6/uL (ref 3.77–5.28)
RDW: 13.4 % (ref 11.7–15.4)
WBC: 6 10*3/uL (ref 3.4–10.8)

## 2019-12-05 LAB — LIPID PANEL W/O CHOL/HDL RATIO
Cholesterol, Total: 163 mg/dL (ref 100–199)
HDL: 50 mg/dL (ref >39)
LDL Chol Calc (NIH): 96 mg/dL (ref 0–99)
Triglycerides: 94 mg/dL (ref 0–149)
VLDL Cholesterol Cal: 17 mg/dL (ref 5–40)

## 2019-12-05 LAB — COMPREHENSIVE METABOLIC PANEL
ALT: 16 IU/L (ref 0–32)
AST: 19 IU/L (ref 0–40)
Albumin/Globulin Ratio: 1.2 (ref 1.2–2.2)
Albumin: 4.2 g/dL (ref 3.8–4.8)
Alkaline Phosphatase: 78 IU/L (ref 39–117)
BUN/Creatinine Ratio: 12 (ref 12–28)
BUN: 12 mg/dL (ref 8–27)
Bilirubin Total: 0.6 mg/dL (ref 0.0–1.2)
CO2: 24 mmol/L (ref 20–29)
Calcium: 9.4 mg/dL (ref 8.7–10.3)
Chloride: 97 mmol/L (ref 96–106)
Creatinine, Ser: 0.99 mg/dL (ref 0.57–1.00)
GFR calc Af Amer: 70 mL/min/{1.73_m2} (ref >59)
GFR calc non Af Amer: 60 mL/min/{1.73_m2} (ref >59)
Globulin, Total: 3.6 g/dL (ref 1.5–4.5)
Glucose: 103 mg/dL — ABNORMAL HIGH (ref 65–99)
Potassium: 3.7 mmol/L (ref 3.5–5.2)
Sodium: 136 mmol/L (ref 134–144)
Total Protein: 7.8 g/dL (ref 6.0–8.5)

## 2019-12-09 ENCOUNTER — Encounter: Payer: Self-pay | Admitting: Family Medicine

## 2019-12-15 NOTE — Assessment & Plan Note (Signed)
Recheck lipids, adjust as needed. Continue current regimen 

## 2019-12-15 NOTE — Assessment & Plan Note (Signed)
Stable, continue lifestyle modifications and current regimen

## 2019-12-15 NOTE — Assessment & Plan Note (Signed)
BPs stable and under good control, continue current regimen 

## 2019-12-15 NOTE — Assessment & Plan Note (Signed)
Discussed diet and exercise, continue working toward weight loss

## 2019-12-15 NOTE — Assessment & Plan Note (Signed)
Followed by Cardiology, pacemaker in place. Continue per their recommendations

## 2019-12-24 ENCOUNTER — Ambulatory Visit (INDEPENDENT_AMBULATORY_CARE_PROVIDER_SITE_OTHER): Payer: Medicare Other | Admitting: General Practice

## 2019-12-24 ENCOUNTER — Telehealth: Payer: Medicare Other | Admitting: General Practice

## 2019-12-24 DIAGNOSIS — N183 Chronic kidney disease, stage 3 unspecified: Secondary | ICD-10-CM

## 2019-12-24 DIAGNOSIS — I495 Sick sinus syndrome: Secondary | ICD-10-CM

## 2019-12-24 DIAGNOSIS — E7800 Pure hypercholesterolemia, unspecified: Secondary | ICD-10-CM | POA: Diagnosis not present

## 2019-12-24 DIAGNOSIS — I10 Essential (primary) hypertension: Secondary | ICD-10-CM | POA: Diagnosis not present

## 2019-12-24 DIAGNOSIS — I129 Hypertensive chronic kidney disease with stage 1 through stage 4 chronic kidney disease, or unspecified chronic kidney disease: Secondary | ICD-10-CM | POA: Diagnosis not present

## 2019-12-24 DIAGNOSIS — Z8673 Personal history of transient ischemic attack (TIA), and cerebral infarction without residual deficits: Secondary | ICD-10-CM

## 2019-12-24 NOTE — Chronic Care Management (AMB) (Signed)
Chronic Care Management   Initial Visit Note  12/24/2019 Name: Mary Webb MRN: [id number] DOB: [birth year]  Referred by: Guadalupe Maple, MD Reason for referral : Chronic Care Management (Initial Outreach: Chronic Disease management and Care coordination needs)   Mary Webb is a 64 y.o. year old female who is a primary care patient of Crissman, Jeannette How, MD. The CCM team was consulted for assistance with chronic disease management and care coordination needs related to HTN, HLD, CKD Stage 3 and Sick Sinus Syndrome and history of CVA  Review of patient status, including review of consultants reports, relevant laboratory and other test results, and collaboration with appropriate care team members and the patient's provider was performed as part of comprehensive patient evaluation and provision of chronic care management services.    SDOH (Social Determinants of Health) screening performed today: Biomedical engineer  Food Insecurity  Stress Physical Activity. See Care Plan for related entries.   Medications: Outpatient Encounter Medications as of 12/24/2019  Medication Sig   Ascorbic Acid (VITAMIN C) 1000 MG tablet Take by mouth. Every other day   chlorthalidone (HYGROTON) 25 MG tablet Take 1 tablet (25 mg total) by mouth daily.   Cholecalciferol (VITAMIN D3) 1000 units CAPS Take by mouth. Taking once a week   clopidogrel (PLAVIX) 75 MG tablet Take 1 tablet (75 mg total) by mouth daily.   Cyanocobalamin (VITAMIN B 12) 500 MCG TABS Take by mouth daily.   losartan (COZAAR) 100 MG tablet Take 1 tablet (100 mg total) by mouth daily.   potassium chloride (KLOR-CON) 20 MEQ packet Take 20 mEq by mouth 2 (two) times daily.   pyridOXINE (VITAMIN B-6) 25 MG tablet Take by mouth daily.    No facility-administered encounter medications on file as of 12/24/2019.     Objective:   BP Readings from Last 3 Encounters:  12/04/19 127/78  12/20/18 (!) 151/75  11/01/18  140/72   Goals Addressed            This Visit's Progress    RNCM: I have a lot of health problems       Current Barriers:   Chronic Disease Management support, education, and care coordination needs related to HTN, HLD, Sick Sinus Syndrome, History of CVA,  and CKD 3  Clinical Goal(s) related to HTN, HLD, Sick Sinus Syndrome, History of CVA,  and CKD 3:  Over the next 90 days, patient will:   Work with the care management team to address educational, disease management, and care coordination needs   Begin or continue self health monitoring activities as directed today Measure and record blood pressure 5 times per week  Call provider office for new or worsened signs and symptoms Blood pressure findings outside established parameters, Shortness of breath, and New or worsened symptom related to Sick Sinus Syndrome CKD3 and new s/s related to CVA  Call care management team with questions or concerns  Verbalize basic understanding of patient centered plan of care established today  Interventions related to HTN, HLD, Sick Sinus Syndrome, History of CVA,  and CKD 3 :   Evaluation of current treatment plans and patient's adherence to plan as established by provider  Assessed patient understanding of disease states  Assessed patient's education and care coordination needs  Provided disease specific education to patient   Collaborated with appropriate clinical care team members regarding patient needs  Patient Self Care Activities related to HTN, HLD, Sick Sinus Syndrome, History of CVA,  and  CKD 3 :   Patient is unable to independently self-manage chronic health conditions  Initial goal documentation      RNCM: I really should increase my activity level       Current Barriers:   Knowledge Deficits related to benefits of regular exercise for patient with Chronic Disease processes  Nurse Case Manager Clinical Goal(s):   Over the next 90 days, patient will verbalize  understanding of plan for increasing activity to fully benefit and improve health maintainence  Over the next 90 days, patient will demonstrate improved adherence to prescribed treatment plan for exercise regimen as evidenced byincreasing activity to at least 20 minutes 4 times a week  Interventions:   Evaluation of current treatment plan related to exercise regimen and patient's adherence to plan as established by provider.  Provided education to patient re: regular, consistent exercise will aide patient in managing chronic health conditions  Reviewed medications with patient and discussed compliance  Discussed plans with patient for ongoing care management follow up and provided patient with direct contact information for care management team  Advised patient, providing education and rationale, to monitor blood pressure daily and record, calling pcp for findings outside established parameters.   Patient Self Care Activities:   Patient verbalizes understanding of plan to increase activity to maintain mobility and strengthen muscles in patient with chronic health conditions  Attends all scheduled provider appointments  Calls provider office for new concerns or questions  Unable to independently manage routine exercise and activity   Initial goal documentation         Mary Webb was given information about Chronic Care Management services today including:  1. CCM service includes personalized support from designated clinical staff supervised by her physician, including individualized plan of care and coordination with other care providers 2. 24/7 contact phone numbers for assistance for urgent and routine care needs. 3. Service will only be billed when office clinical staff spend 20 minutes or more in a month to coordinate care. 4. Only one practitioner may furnish and bill the service in a calendar month. 5. The patient may stop CCM services at any time (effective at the end of  the month) by phone call to the office staff. 6. The patient will be responsible for cost sharing (co-pay) of up to 20% of the service fee (after annual deductible is met).  Patient agreed to services and verbal consent obtained.   Plan:   Telephone follow up appointment with care management team member scheduled for: 02-19-2020 at 2 pm  [name] RN, MSN, [facility] Mobile: [phone]

## 2019-12-24 NOTE — Patient Instructions (Signed)
Visit Information  Goals Addressed            This Visit's Progress   . RNCM: I have a lot of health problems       Current Barriers:  . Chronic Disease Management support, education, and care coordination needs related to HTN, HLD, Sick Sinus Syndrome, History of CVA,  and CKD 3  Clinical Goal(s) related to HTN, HLD, Sick Sinus Syndrome, History of CVA,  and CKD 3:  Over the next 90 days, patient will:  . Work with the care management team to address educational, disease management, and care coordination needs  . Begin or continue self health monitoring activities as directed today Measure and record blood pressure 5 times per week . Call provider office for new or worsened signs and symptoms Blood pressure findings outside established parameters, Shortness of breath, and New or worsened symptom related to Sick Sinus Syndrome CKD3 and new s/s related to CVA . Call care management team with questions or concerns . Verbalize basic understanding of patient centered plan of care established today  Interventions related to HTN, HLD, Sick Sinus Syndrome, History of CVA,  and CKD 3 :  . Evaluation of current treatment plans and patient's adherence to plan as established by provider . Assessed patient understanding of disease states . Assessed patient's education and care coordination needs . Provided disease specific education to patient  . Collaborated with appropriate clinical care team members regarding patient needs  Patient Self Care Activities related to HTN, HLD, Sick Sinus Syndrome, History of CVA,  and CKD 3 :  . Patient is unable to independently self-manage chronic health conditions  Initial goal documentation     . RNCM: I really should increase my activity level       Current Barriers:  Marland Kitchen Knowledge Deficits related to benefits of regular exercise for patient with Chronic Disease processes  Nurse Case Manager Clinical Goal(s):  Marland Kitchen Over the next 90 days, patient will  verbalize understanding of plan for increasing activity to fully benefit and improve health maintainence . Over the next 90 days, patient will demonstrate improved adherence to prescribed treatment plan for exercise regimen as evidenced byincreasing activity to at least 20 minutes 4 times a week  Interventions:  . Evaluation of current treatment plan related to exercise regimen and patient's adherence to plan as established by provider. . Provided education to patient re: regular, consistent exercise will aide patient in managing chronic health conditions . Reviewed medications with patient and discussed compliance . Discussed plans with patient for ongoing care management follow up and provided patient with direct contact information for care management team . Advised patient, providing education and rationale, to monitor blood pressure daily and record, calling pcp for findings outside established parameters.   Patient Self Care Activities:  . Patient verbalizes understanding of plan to increase activity to maintain mobility and strengthen muscles in patient with chronic health conditions . Attends all scheduled provider appointments . Calls provider office for new concerns or questions . Unable to independently manage routine exercise and activity   Initial goal documentation        Mary Webb was given information about Chronic Care Management services today including:  1. CCM service includes personalized support from designated clinical staff supervised by her physician, including individualized plan of care and coordination with other care providers 2. 24/7 contact phone numbers for assistance for urgent and routine care needs. 3. Service will only be billed when office clinical staff spend  20 minutes or more in a month to coordinate care. 4. Only one practitioner may furnish and bill the service in a calendar month. 5. The patient may stop CCM services at any time (effective at the  end of the month) by phone call to the office staff. 6. The patient will be responsible for cost sharing (co-pay) of up to 20% of the service fee (after annual deductible is met).  Patient agreed to services and verbal consent obtained.   The patient verbalized understanding of instructions provided today and declined a print copy of patient instruction materials.   Telephone follow up appointment with care management team member scheduled for:02-19-2020 at 2 pm  Mary Larsson RN, MSN, [facility] Mobile: [phone]

## 2020-01-21 DIAGNOSIS — D352 Benign neoplasm of pituitary gland: Secondary | ICD-10-CM | POA: Diagnosis not present

## 2020-01-21 DIAGNOSIS — H35033 Hypertensive retinopathy, bilateral: Secondary | ICD-10-CM | POA: Diagnosis not present

## 2020-01-21 DIAGNOSIS — H40013 Open angle with borderline findings, low risk, bilateral: Secondary | ICD-10-CM | POA: Diagnosis not present

## 2020-02-01 ENCOUNTER — Ambulatory Visit: Payer: Medicare Other | Attending: Internal Medicine

## 2020-02-01 DIAGNOSIS — Z23 Encounter for immunization: Secondary | ICD-10-CM

## 2020-02-01 NOTE — Progress Notes (Signed)
   Covid-19 Vaccination Clinic  Name:  Mary Webb    MRN: [id number] DOB: [birth year]  02/01/2020  [name] was observed post Covid-19 immunization for 30 minutes based on pre-vaccination screening without incident. She was provided with Vaccine Information Sheet and instruction to access the V-Safe system.   [name] was instructed to call 911 with any severe reactions post vaccine: Marland Kitchen Difficulty breathing  . Swelling of face and throat  . A fast heartbeat  . A bad rash all over body  . Dizziness and weakness   Immunizations Administered    Name Date Dose VIS Date Route   Pfizer COVID-19 Vaccine 02/01/2020 12:00 PM 0.3 mL 10/25/2019 Intramuscular   Manufacturer: [place]   Lot: [id number]   [place]: [id number]

## 2020-02-04 DIAGNOSIS — D352 Benign neoplasm of pituitary gland: Secondary | ICD-10-CM | POA: Diagnosis not present

## 2020-02-19 ENCOUNTER — Ambulatory Visit: Payer: Self-pay | Admitting: General Practice

## 2020-02-19 ENCOUNTER — Telehealth: Payer: Self-pay

## 2020-02-19 NOTE — Chronic Care Management (AMB) (Signed)
°  Chronic Care Management   Outreach Note  02/19/2020 Name: Mary Webb MRN: [id number] DOB: 01-10-1956  Referred by: Guadalupe Maple, MD Reason for referral : Chronic Care Management (Follow up Chronic Disease Management and Care coordination needs )   An unsuccessful telephone outreach was attempted today. The patient was referred to the case management team for assistance with care management and care coordination.   Follow Up Plan: The care management team will reach out to the patient again over the next 30 to 60 days.   [name] RN, MSN, [facility] Mobile: [phone]

## 2020-02-22 ENCOUNTER — Ambulatory Visit: Payer: Medicare Other | Attending: Internal Medicine

## 2020-02-22 DIAGNOSIS — Z23 Encounter for immunization: Secondary | ICD-10-CM

## 2020-02-22 NOTE — Progress Notes (Addendum)
   Covid-19 Vaccination Clinic  Name:  Mary Webb    MRN: [id number] DOB: Dec 20, 1955  02/22/2020  [name] was observed post Covid-19 immunization for 30 minutes based on pre-vaccination screening without incident. She was provided with Vaccine Information Sheet and instruction to access the V-Safe system.   [name] was instructed to call 911 with any severe reactions post vaccine: Marland Kitchen Difficulty breathing  . Swelling of face and throat  . A fast heartbeat  . A bad rash all over body  . Dizziness and weakness   Immunizations Administered    Name Date Dose VIS Date Route   Pfizer COVID-19 Vaccine 02/22/2020 12:05 PM 0.3 mL 10/25/2019 Intramuscular   Manufacturer: [place]   Lot: [id number]   [place]: [id number]

## 2020-03-24 ENCOUNTER — Ambulatory Visit (INDEPENDENT_AMBULATORY_CARE_PROVIDER_SITE_OTHER): Payer: Medicare Other | Admitting: General Practice

## 2020-03-24 ENCOUNTER — Telehealth: Payer: Self-pay | Admitting: General Practice

## 2020-03-24 DIAGNOSIS — I10 Essential (primary) hypertension: Secondary | ICD-10-CM | POA: Diagnosis not present

## 2020-03-24 DIAGNOSIS — E7800 Pure hypercholesterolemia, unspecified: Secondary | ICD-10-CM

## 2020-03-24 DIAGNOSIS — Z8673 Personal history of transient ischemic attack (TIA), and cerebral infarction without residual deficits: Secondary | ICD-10-CM

## 2020-03-24 DIAGNOSIS — I495 Sick sinus syndrome: Secondary | ICD-10-CM

## 2020-03-24 DIAGNOSIS — D352 Benign neoplasm of pituitary gland: Secondary | ICD-10-CM

## 2020-03-24 NOTE — Chronic Care Management (AMB) (Signed)
Chronic Care Management   Follow Up Note   03/24/2020 Name: Mary Webb MRN: [id number] DOB: [birth year]  Referred by: Guadalupe Maple, MD Reason for referral : Chronic Care Management (Follow up: HTN/CKD3/ Sick sinus syndome)   Mary Webb is a 64 y.o. year old female who is a primary care patient of Crissman, Jeannette How, MD. The CCM team was consulted for assistance with chronic disease management and care coordination needs.    Review of patient status, including review of consultants reports, relevant laboratory and other test results, and collaboration with appropriate care team members and the patient's provider was performed as part of comprehensive patient evaluation and provision of chronic care management services.    SDOH (Social Determinants of Health) assessments performed: Yes- exercise.  The patient is tolerating more exercise and plans on planting a small vegetable garden this summer.  See Care Plan activities for detailed interventions related to [facility])     Outpatient Encounter Medications as of 03/24/2020  Medication Sig  . Ascorbic Acid (VITAMIN C) 1000 MG tablet Take by mouth. Every other day  . chlorthalidone (HYGROTON) 25 MG tablet Take 1 tablet (25 mg total) by mouth daily.  . Cholecalciferol (VITAMIN D3) 1000 units CAPS Take by mouth. Taking once a week  . clopidogrel (PLAVIX) 75 MG tablet Take 1 tablet (75 mg total) by mouth daily.  . Cyanocobalamin (VITAMIN B 12) 500 MCG TABS Take by mouth daily.  Marland Kitchen losartan (COZAAR) 100 MG tablet Take 1 tablet (100 mg total) by mouth daily.  . potassium chloride (KLOR-CON) 20 MEQ packet Take 20 mEq by mouth 2 (two) times daily.  Marland Kitchen pyridOXINE (VITAMIN B-6) 25 MG tablet Take by mouth daily.    No facility-administered encounter medications on file as of 03/24/2020.     Objective:  BP Readings from Last 3 Encounters:  12/04/19 127/78  12/20/18 (!) 151/75  11/01/18 140/72    Goals Addressed            This  Visit's Progress   . RNCM: I have a lot of health problems       Current Barriers:  . Chronic Disease Management support, education, and care coordination needs related to HTN, HLD, Sick Sinus Syndrome, History of CVA,  and CKD 3  Clinical Goal(s) related to HTN, HLD, Sick Sinus Syndrome, History of CVA,  and CKD 3:  Over the next 90 days, patient will:  . Work with the care management team to address educational, disease management, and care coordination needs  . Begin or continue self health monitoring activities as directed today Measure and record blood pressure 5 times per week . Call provider office for new or worsened signs and symptoms Blood pressure findings outside established parameters, Shortness of breath, and New or worsened symptom related to Sick Sinus Syndrome CKD3 and new s/s related to CVA . Call care management team with questions or concerns . Verbalize basic understanding of patient centered plan of care established today  Interventions related to HTN, HLD, Sick Sinus Syndrome, History of CVA,  and CKD 3 :  . Evaluation of current treatment plans and patient's adherence to plan as established by provider.  The patient states she is doing well. 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 She also needs a mammogram but was advised  after her 2nd covid vaccine to wait at least 7 weeks. She will have that done in July.  . Assessed patient understanding of disease states.  The patient has a good understanding of her chronic conditions and feels she is doing well currently with managing her health and well being. . Assessed patient's education and care coordination needs.  The patient denies any needs at this time. Education given on Heart healthy diet. The patient is eating more fruits and vegetables. The patient states that she has vegetables  to plant that her God-daughter gave her and she will have fresh vegetables to eat.  . Provided disease specific education to patient  . Collaborated with appropriate clinical care team members regarding patient needs  Patient Self Care Activities related to HTN, HLD, Sick Sinus Syndrome, History of CVA,  and CKD 3 :  . Patient is unable to independently self-manage chronic health conditions  Please see past updates related to this goal by clicking on the "Past Updates" button in the selected goal      . RNCM: I really should increase my activity level       Current Barriers:  Marland Kitchen Knowledge Deficits related to benefits of regular exercise for patient with Chronic Disease processes  Nurse Case Manager Clinical Goal(s):  Marland Kitchen Over the next 90 days, patient will verbalize understanding of plan for increasing activity to fully benefit and improve health maintainence . Over the next 90 days, patient will demonstrate improved adherence to prescribed treatment plan for exercise regimen as evidenced byincreasing activity to at least 20 minutes 4 times a week  Interventions:  . Evaluation of current treatment plan related to exercise regimen and patient's adherence to plan as established by provider. . Provided education to patient re: regular, consistent exercise will aide patient in managing chronic health conditions . Evaluation of activity level. The patient states for mothers day her God-daughter gave her vegetable plants to plant and she is going to be working in her little garden getting fresh vegetables this summer.  . Reviewed medications with patient and discussed compliance.  The patient comes to see the pcp in July but will need a refill on her plavix and potassium before then. Will let pharmacist and pcp know. She has called the office per the patient. . Discussed plans with patient for ongoing care management follow up and provided patient with direct contact information for care management  team . Advised patient, providing education and rationale, to monitor blood pressure daily and record, calling pcp for findings outside established parameters.   Patient Self Care Activities:  . Patient verbalizes understanding of plan to increase activity to maintain mobility and strengthen muscles in patient with chronic health conditions . Attends all scheduled provider appointments . Calls provider office for new concerns or questions . Unable to independently manage routine exercise and activity   Please see past updates related to this goal by clicking on the "Past Updates" button in the selected goal          Plan:   The care management team will reach out to the patient again over the next 60 to 90 days.    [name] RN, MSN, [facility] Mobile: [phone]

## 2020-03-24 NOTE — Patient Instructions (Signed)
Visit Information  Goals Addressed            This Visit's Progress   . RNCM: I have a lot of health problems       Current Barriers:  . Chronic Disease Management support, education, and care coordination needs related to HTN, HLD, Sick Sinus Syndrome, History of CVA,  and CKD 3  Clinical Goal(s) related to HTN, HLD, Sick Sinus Syndrome, History of CVA,  and CKD 3:  Over the next 90 days, patient will:  . Work with the care management team to address educational, disease management, and care coordination needs  . Begin or continue self health monitoring activities as directed today Measure and record blood pressure 5 times per week . Call provider office for new or worsened signs and symptoms Blood pressure findings outside established parameters, Shortness of breath, and New or worsened symptom related to Sick Sinus Syndrome CKD3 and new s/s related to CVA . Call care management team with questions or concerns . Verbalize basic understanding of patient centered plan of care established today  Interventions related to HTN, HLD, Sick Sinus Syndrome, History of CVA,  and CKD 3 :  . Evaluation of current treatment plans and patient's adherence to plan as established by provider.  The patient states she is doing well. 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 She also needs a mammogram but was advised after her 2nd covid vaccine to wait at least 7 weeks. She will have that done in July.  . Assessed patient understanding of disease states.  The patient has a good understanding of her chronic conditions and feels she is doing well currently with managing her health and well being. . Assessed patient's education and care coordination needs.  The patient denies any needs at this time. Education given on Heart healthy diet. The patient is eating more fruits  and vegetables. The patient states that she has vegetables to plant that her God-daughter gave her and she will have fresh vegetables to eat.  . Provided disease specific education to patient  . Collaborated with appropriate clinical care team members regarding patient needs  Patient Self Care Activities related to HTN, HLD, Sick Sinus Syndrome, History of CVA,  and CKD 3 :  . Patient is unable to independently self-manage chronic health conditions  Please see past updates related to this goal by clicking on the "Past Updates" button in the selected goal      . RNCM: I really should increase my activity level       Current Barriers:  Marland Kitchen Knowledge Deficits related to benefits of regular exercise for patient with Chronic Disease processes  Nurse Case Manager Clinical Goal(s):  Marland Kitchen Over the next 90 days, patient will verbalize understanding of plan for increasing activity to fully benefit and improve health maintainence . Over the next 90 days, patient will demonstrate improved adherence to prescribed treatment plan for exercise regimen as evidenced byincreasing activity to at least 20 minutes 4 times a week  Interventions:  . Evaluation of current treatment plan related to exercise regimen and patient's adherence to plan as established by provider. . Provided education to patient re: regular, consistent exercise will aide patient in managing chronic health conditions . Evaluation of activity level. The patient states for mothers day her God-daughter gave her vegetable  plants to plant and she is going to be working in her little garden getting fresh vegetables this summer.  . Reviewed medications with patient and discussed compliance.  The patient comes to see the pcp in July but will need a refill on her plavix and potassium before then. Will let pharmacist and pcp know. She has called the office per the patient. . Discussed plans with patient for ongoing care management follow up and provided  patient with direct contact information for care management team . Advised patient, providing education and rationale, to monitor blood pressure daily and record, calling pcp for findings outside established parameters.   Patient Self Care Activities:  . Patient verbalizes understanding of plan to increase activity to maintain mobility and strengthen muscles in patient with chronic health conditions . Attends all scheduled provider appointments . Calls provider office for new concerns or questions . Unable to independently manage routine exercise and activity   Please see past updates related to this goal by clicking on the "Past Updates" button in the selected goal         Patient verbalizes understanding of instructions provided today.   The care management team will reach out to the patient again over the next 60 to 90 days.   [name] RN, MSN, [facility] Mobile: [phone]

## 2020-03-26 ENCOUNTER — Telehealth: Payer: Self-pay

## 2020-03-26 ENCOUNTER — Other Ambulatory Visit: Payer: Self-pay | Admitting: Family Medicine

## 2020-03-26 DIAGNOSIS — I6322 Cerebral infarction due to unspecified occlusion or stenosis of basilar arteries: Secondary | ICD-10-CM

## 2020-03-26 DIAGNOSIS — I495 Sick sinus syndrome: Secondary | ICD-10-CM

## 2020-03-26 DIAGNOSIS — I10 Essential (primary) hypertension: Secondary | ICD-10-CM

## 2020-03-26 MED ORDER — POTASSIUM CHLORIDE CRYS ER 20 MEQ PO TBCR: 20.0000 meq | EXTENDED_RELEASE_TABLET | Freq: Two times a day (BID) | ORAL | 2 refills | Status: DC

## 2020-03-26 MED ORDER — CLOPIDOGREL BISULFATE 75 MG PO TABS: 75.0000 mg | ORAL_TABLET | Freq: Every day | ORAL | 0 refills | Status: DC

## 2020-03-26 MED ORDER — POTASSIUM CHLORIDE 20 MEQ PO PACK: 20.0000 meq | PACK | Freq: Two times a day (BID) | ORAL | 1 refills | Status: DC

## 2020-03-26 NOTE — Telephone Encounter (Signed)
Pharmacy sent a fax stating the Potassium packets are expensive and want to know if tablets can be sent in instead? States they have discussed with patient and patient is ok with taking the tablets. If approved, please change RX and resend to the pharmacy.

## 2020-03-26 NOTE — Telephone Encounter (Signed)
Rx changed to tablets

## 2020-03-31 DIAGNOSIS — I495 Sick sinus syndrome: Secondary | ICD-10-CM | POA: Diagnosis not present

## 2020-06-03 ENCOUNTER — Other Ambulatory Visit: Payer: Self-pay

## 2020-06-03 ENCOUNTER — Encounter: Payer: Self-pay | Admitting: Family Medicine

## 2020-06-03 ENCOUNTER — Ambulatory Visit (INDEPENDENT_AMBULATORY_CARE_PROVIDER_SITE_OTHER): Payer: Medicare Other | Admitting: Family Medicine

## 2020-06-03 VITALS — BP 137/84 | HR 83 | Temp 98.1°F | Wt 380.0 lb

## 2020-06-03 DIAGNOSIS — E7800 Pure hypercholesterolemia, unspecified: Secondary | ICD-10-CM | POA: Diagnosis not present

## 2020-06-03 DIAGNOSIS — I10 Essential (primary) hypertension: Secondary | ICD-10-CM

## 2020-06-03 DIAGNOSIS — Z8673 Personal history of transient ischemic attack (TIA), and cerebral infarction without residual deficits: Secondary | ICD-10-CM

## 2020-06-03 DIAGNOSIS — I129 Hypertensive chronic kidney disease with stage 1 through stage 4 chronic kidney disease, or unspecified chronic kidney disease: Secondary | ICD-10-CM | POA: Diagnosis not present

## 2020-06-03 DIAGNOSIS — N183 Chronic kidney disease, stage 3 unspecified: Secondary | ICD-10-CM

## 2020-06-03 DIAGNOSIS — R7309 Other abnormal glucose: Secondary | ICD-10-CM | POA: Diagnosis not present

## 2020-06-03 DIAGNOSIS — I495 Sick sinus syndrome: Secondary | ICD-10-CM

## 2020-06-03 DIAGNOSIS — M2550 Pain in unspecified joint: Secondary | ICD-10-CM

## 2020-06-03 DIAGNOSIS — I6322 Cerebral infarction due to unspecified occlusion or stenosis of basilar arteries: Secondary | ICD-10-CM

## 2020-06-03 MED ORDER — POTASSIUM CHLORIDE CRYS ER 20 MEQ PO TBCR: 20.0000 meq | EXTENDED_RELEASE_TABLET | Freq: Two times a day (BID) | ORAL | 1 refills | Status: DC

## 2020-06-03 MED ORDER — DICLOFENAC SODIUM 1 % EX GEL: 2.0000 g | Freq: Four times a day (QID) | CUTANEOUS | 1 refills | Status: DC

## 2020-06-03 MED ORDER — LOSARTAN POTASSIUM 100 MG PO TABS: 100.0000 mg | ORAL_TABLET | Freq: Every day | ORAL | 1 refills | Status: DC

## 2020-06-03 MED ORDER — CLOPIDOGREL BISULFATE 75 MG PO TABS: 75.0000 mg | ORAL_TABLET | Freq: Every day | ORAL | 1 refills | Status: DC

## 2020-06-03 MED ORDER — CHLORTHALIDONE 25 MG PO TABS: 25.0000 mg | ORAL_TABLET | Freq: Every day | ORAL | 1 refills | Status: DC

## 2020-06-03 NOTE — Progress Notes (Signed)
BP 137/84   Pulse 83   Temp 98.1 F (36.7 C) (Oral)   Wt (!) 380 lb (172.4 kg)   SpO2 96%   BMI 69.50 kg/m    Subjective:    Patient ID: Mary Webb, female    DOB: November 16, 1955, 64 y.o.   MRN: [id number]  HPI: Mary Webb is a 64 y.o. female  Chief Complaint  Patient presents with  . Hypertension  . Hyperlipidemia   HTN with CKD, sick sinus - pacemaker in place. Followed by Electrophysiologist for monitoring of this. Home BPs running 120s-130s/80s. Denies side effects, takes medications faithfully.   HLD, hx of CVA - on plavix, has asa allergy and declines statins or other cholesterol lowering agents. Diet controlled for HLD. Denies claudication, CP, SOB, new neurologic sxs. Tries to eat healthy.   Has areas of joint pains, taking tylenol prn for these. Denies swelling, redness, fevers. Sometimes has some stiffness.   Relevant past medical, surgical, family and social history reviewed and updated as indicated. Interim medical history since our last visit reviewed. Allergies and medications reviewed and updated.  Review of Systems  Per HPI unless specifically indicated above     Objective:    BP 137/84   Pulse 83   Temp 98.1 F (36.7 C) (Oral)   Wt (!) 380 lb (172.4 kg)   SpO2 96%   BMI 69.50 kg/m   Wt Readings from Last 3 Encounters:  06/03/20 (!) 380 lb (172.4 kg)  12/04/19 (!) 379 lb (171.9 kg)  11/06/19 (!) 360 lb (163.3 kg)    Physical Exam  Results for orders placed or performed in visit on 06/03/20  Comprehensive metabolic panel  Result Value Ref Range   Glucose 110 (H) 65 - 99 mg/dL   BUN 14 8 - 27 mg/dL   Creatinine, Ser 1.05 (H) 0.57 - 1.00 mg/dL   GFR calc non Af Amer 56 (L) >59 mL/min/1.73   GFR calc Af Amer 64 >59 mL/min/1.73   BUN/Creatinine Ratio 13 12 - 28   Sodium 139 134 - 144 mmol/L   Potassium 4.0 3.5 - 5.2 mmol/L   Chloride 100 96 - 106 mmol/L   CO2 27 20 - 29 mmol/L   Calcium 9.7 8.7 - 10.3 mg/dL   Total Protein 7.8  6.0 - 8.5 g/dL   Albumin 4.2 3.8 - 4.8 g/dL   Globulin, Total 3.6 1.5 - 4.5 g/dL   Albumin/Globulin Ratio 1.2 1.2 - 2.2   Bilirubin Total 0.5 0.0 - 1.2 mg/dL   Alkaline Phosphatase 82 48 - 121 IU/L   AST 19 0 - 40 IU/L   ALT 20 0 - 32 IU/L  Lipid Panel w/o Chol/HDL Ratio  Result Value Ref Range   Cholesterol, Total 172 100 - 199 mg/dL   Triglycerides 102 0 - 149 mg/dL   HDL 50 >39 mg/dL   VLDL Cholesterol Cal 19 5 - 40 mg/dL   LDL Chol Calc (NIH) 103 (H) 0 - 99 mg/dL  HgB A1c  Result Value Ref Range   Hgb A1c MFr Bld 6.3 (H) 4.8 - 5.6 %   Est. average glucose Bld gHb Est-mCnc 134 mg/dL      Assessment & Plan:   Problem List Items Addressed This Visit      Cardiovascular and Mediastinum   Sick sinus syndrome (HCC) (Chronic)    Followed by elecrophysiologist, continue per their recommendations      Relevant Medications   losartan (COZAAR) 100 MG tablet  clopidogrel (PLAVIX) 75 MG tablet   chlorthalidone (HYGROTON) 25 MG tablet     Genitourinary   Hypertensive kidney disease with CKD stage III - Primary    BPs stable and under good control, continue losartan and chlorthalidone regimen      Relevant Medications   losartan (COZAAR) 100 MG tablet   chlorthalidone (HYGROTON) 25 MG tablet   Other Relevant Orders   Comprehensive metabolic panel (Completed)     Other   Hyperlipidemia (Chronic)    Diet controlled, declines medications. Recheck lipids, continue lifestyle modifications for improved control      Relevant Medications   losartan (COZAAR) 100 MG tablet   chlorthalidone (HYGROTON) 25 MG tablet   Other Relevant Orders   Lipid Panel w/o Chol/HDL Ratio (Completed)   History of CVA (cerebrovascular accident)    No new neurologic issues. 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 Discussed exercise, weight loss, supplements for anti-inflammation      Follow up plan: Return in about 6 months (around 12/04/2020) for CPE.

## 2020-06-04 LAB — COMPREHENSIVE METABOLIC PANEL
ALT: 20 IU/L (ref 0–32)
AST: 19 IU/L (ref 0–40)
Albumin/Globulin Ratio: 1.2 (ref 1.2–2.2)
Albumin: 4.2 g/dL (ref 3.8–4.8)
Alkaline Phosphatase: 82 IU/L (ref 48–121)
BUN/Creatinine Ratio: 13 (ref 12–28)
BUN: 14 mg/dL (ref 8–27)
Bilirubin Total: 0.5 mg/dL (ref 0.0–1.2)
CO2: 27 mmol/L (ref 20–29)
Calcium: 9.7 mg/dL (ref 8.7–10.3)
Chloride: 100 mmol/L (ref 96–106)
Creatinine, Ser: 1.05 mg/dL — ABNORMAL HIGH (ref 0.57–1.00)
GFR calc Af Amer: 64 mL/min/{1.73_m2} (ref >59)
GFR calc non Af Amer: 56 mL/min/{1.73_m2} — ABNORMAL LOW (ref >59)
Globulin, Total: 3.6 g/dL (ref 1.5–4.5)
Glucose: 110 mg/dL — ABNORMAL HIGH (ref 65–99)
Potassium: 4 mmol/L (ref 3.5–5.2)
Sodium: 139 mmol/L (ref 134–144)
Total Protein: 7.8 g/dL (ref 6.0–8.5)

## 2020-06-04 LAB — LIPID PANEL W/O CHOL/HDL RATIO
Cholesterol, Total: 172 mg/dL (ref 100–199)
HDL: 50 mg/dL (ref >39)
LDL Chol Calc (NIH): 103 mg/dL — ABNORMAL HIGH (ref 0–99)
Triglycerides: 102 mg/dL (ref 0–149)
VLDL Cholesterol Cal: 19 mg/dL (ref 5–40)

## 2020-06-04 LAB — HEMOGLOBIN A1C
Est. average glucose Bld gHb Est-mCnc: 134 mg/dL
Hgb A1c MFr Bld: 6.3 % — ABNORMAL HIGH (ref 4.8–5.6)

## 2020-06-09 ENCOUNTER — Telehealth: Payer: Self-pay | Admitting: General Practice

## 2020-06-09 ENCOUNTER — Ambulatory Visit (INDEPENDENT_AMBULATORY_CARE_PROVIDER_SITE_OTHER): Payer: Medicare Other | Admitting: General Practice

## 2020-06-09 DIAGNOSIS — I10 Essential (primary) hypertension: Secondary | ICD-10-CM

## 2020-06-09 DIAGNOSIS — E7800 Pure hypercholesterolemia, unspecified: Secondary | ICD-10-CM | POA: Diagnosis not present

## 2020-06-09 DIAGNOSIS — I495 Sick sinus syndrome: Secondary | ICD-10-CM

## 2020-06-09 NOTE — Patient Instructions (Signed)
Visit Information  Goals Addressed            This Visit's Progress   . RNCM: I have a lot of health problems       Current Barriers:  . Chronic Disease Management support, education, and care coordination needs related to HTN, HLD, Sick Sinus Syndrome, History of CVA,  and CKD 3  Clinical Goal(s) related to HTN, HLD, Sick Sinus Syndrome, History of CVA,  and CKD 3:  Over the next 90 days, patient will:  . Work with the care management team to address educational, disease management, and care coordination needs  . Begin or continue self health monitoring activities as directed today Measure and record blood pressure 5 times per week . Call provider office for new or worsened signs and symptoms Blood pressure findings outside established parameters, Shortness of breath, and New or worsened symptom related to Sick Sinus Syndrome CKD3 and new s/s related to CVA . Call care management team with questions or concerns . Verbalize basic understanding of patient centered plan of care established today  Interventions related to HTN, HLD, Sick Sinus Syndrome, History of CVA,  and CKD 3 :  . Evaluation of current treatment plans and patient's adherence to plan as established by provider.  The patient states she is doing well. 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 She also needs a mammogram but was advised after her 2nd covid vaccine to wait at least 7 weeks. She will have that done in July. 06-09-2020: the patient states she decided not to have the MRI done. She does need to still get her mammogram done. Provided the number for [facility] at [phone] so the patient could call and schedule her mammogram.  . Assessed patient understanding of disease states.  The patient has a good understanding of her chronic conditions and feels she is doing  well currently with managing her health and well being. . Assessed patient's education and care coordination needs.  The patient denies any needs at this time. Education given on Heart healthy diet. The patient is eating more fruits and vegetables. The patient states that she has vegetables to plant that her God-daughter gave her and she will have fresh vegetables to eat.  . Provided disease specific education to patient. Review of chronic conditions and new concerns.  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 She saw pcp last week and states the medication she received is doing well for her. She rates her pain level today at a 3 on a scale of 0-10 in bilateral knees. Denies any acute distress. Will continue to monitor.  [name] with appropriate clinical care team members regarding patient needs: 06-09-2020: The patient states that her rolling walker with seat is messed up and ask if the pcp could send a new order in for a new one to [facility].  Will collaborate with pcp and ask if this is something that can be done on behalf of the patient since she was seen in the office recently.   Patient Self Care Activities related to HTN, HLD, Sick Sinus Syndrome, History of CVA,  and CKD 3 :  . Patient is unable to independently self-manage chronic health conditions  Please see past updates  related to this goal by clicking on the "Past Updates" button in the selected goal      . RNCM: I really should increase my activity level       Current Barriers:  Marland Kitchen Knowledge Deficits related to benefits of regular exercise for patient with Chronic Disease processes  Nurse Case Manager Clinical Goal(s):  Marland Kitchen Over the next 90 days, patient will verbalize understanding of plan for increasing activity to fully benefit and improve health maintainence . Over the next 90 days, patient will demonstrate improved adherence to prescribed treatment plan  for exercise regimen as evidenced byincreasing activity to at least 20 minutes 4 times a week  Interventions:  . Evaluation of current treatment plan related to exercise regimen and patient's adherence to plan as established by provider. . Provided education to patient re: regular, consistent exercise will aide patient in managing chronic health conditions . Evaluation of activity level. The patient states for mothers day her God-daughter gave her vegetable plants to plant and she is going to be working in her little garden getting fresh vegetables this summer.  . Reviewed medications with patient and discussed compliance.  The patient comes to see the pcp in July but will need a refill on her plavix and potassium before then. Will let pharmacist and pcp know. She has called the office per the patient. . Discussed plans with patient for ongoing care management follow up and provided patient with direct contact information for care management team . Advised patient, providing education and rationale, to monitor blood pressure daily and record, calling pcp for findings outside established parameters.   Patient Self Care Activities:  . Patient verbalizes understanding of plan to increase activity to maintain mobility and strengthen muscles in patient with chronic health conditions . Attends all scheduled provider appointments . Calls provider office for new concerns or questions . Unable to independently manage routine exercise and activity   Please see past updates related to this goal by clicking on the "Past Updates" button in the selected goal         Patient verbalizes understanding of instructions provided today.   Telephone follow up appointment with care management team member scheduled for: 08-04-2020 at 3 pm  [name] RN, MSN, [facility] Mobile: [phone]

## 2020-06-09 NOTE — Assessment & Plan Note (Signed)
No new neurologic issues. Continue plavix and lifestyle modifications 

## 2020-06-09 NOTE — Chronic Care Management (AMB) (Signed)
Chronic Care Management   Follow Up Note   06/09/2020 Name: Mary Webb MRN: [id number] DOB: [birth year]  Referred by: Volney American, PA-C Reason for referral : Appointment [facility] scheduled appointment) and Chronic Care Management (RNCM Follow up Call: HTN/HLD/Sick Sinus Syndrome and other concerns)   Mary Webb is a 64 y.o. year old female who is a primary care patient of Volney American, [place]. The CCM team was consulted for assistance with chronic disease management and care coordination needs.    Review of patient status, including review of consultants reports, relevant laboratory and other test results, and collaboration with appropriate care team members and the patient's provider was performed as part of comprehensive patient evaluation and provision of chronic care management services.    SDOH (Social Determinants of Health) assessments performed: Yes See Care Plan activities for detailed interventions related to [facility])     Outpatient Encounter Medications as of 06/09/2020  Medication Sig   Ascorbic Acid (VITAMIN C) 1000 MG tablet Take by mouth. Every other day   Calcium Carb-Cholecalciferol (CALCIUM 500 + D3 PO) Take by mouth daily.   chlorthalidone (HYGROTON) 25 MG tablet Take 1 tablet (25 mg total) by mouth daily.   Cholecalciferol (VITAMIN D3) 1000 units CAPS Take by mouth. Taking once a week   clopidogrel (PLAVIX) 75 MG tablet Take 1 tablet (75 mg total) by mouth daily.   Cyanocobalamin (VITAMIN B 12) 500 MCG TABS Take by mouth daily.   diclofenac Sodium (VOLTAREN) 1 % GEL Apply 2 g topically 4 (four) times daily.   losartan (COZAAR) 100 MG tablet Take 1 tablet (100 mg total) by mouth daily.   potassium chloride SA (KLOR-CON) 20 MEQ tablet Take 1 tablet (20 mEq total) by mouth 2 (two) times daily.   pyridOXINE (VITAMIN B-6) 25 MG tablet Take by mouth daily.    No facility-administered encounter medications on file as of 06/09/2020.      Objective:   BP Readings from Last 3 Encounters:  06/03/20 137/84  12/04/19 127/78  12/20/18 (!) 151/75   Goals Addressed            This Visit's Progress    RNCM: I have a lot of health problems       Current Barriers:   Chronic Disease Management support, education, and care coordination needs related to HTN, HLD, Sick Sinus Syndrome, History of CVA,  and CKD 3  Clinical Goal(s) related to HTN, HLD, Sick Sinus Syndrome, History of CVA,  and CKD 3:  Over the next 90 days, patient will:   Work with the care management team to address educational, disease management, and care coordination needs   Begin or continue self health monitoring activities as directed today Measure and record blood pressure 5 times per week  Call provider office for new or worsened signs and symptoms Blood pressure findings outside established parameters, Shortness of breath, and New or worsened symptom related to Sick Sinus Syndrome CKD3 and new s/s related to CVA  Call care management team with questions or concerns  Verbalize basic understanding of patient centered plan of care established today  Interventions related to HTN, HLD, Sick Sinus Syndrome, History of CVA,  and CKD 3 :   Evaluation of current treatment plans and patient's adherence to plan as established by provider.  The patient states she is doing well. 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 She also needs a mammogram but was advised after her 2nd covid vaccine to wait at least 7 weeks. She will have that done in July. 06-09-2020: the patient states she decided not to have the MRI done. She does need to still get her mammogram done. Provided the number for [facility] at [phone] so the patient could call and schedule her mammogram.   Assessed patient understanding of disease  states.  The patient has a good understanding of her chronic conditions and feels she is doing well currently with managing her health and well being.  Assessed patient's education and care coordination needs.  The patient denies any needs at this time. Education given on Heart healthy diet. The patient is eating more fruits and vegetables. The patient states that she has vegetables to plant that her God-daughter gave her and she will have fresh vegetables to eat.   Provided disease specific education to patient. Review of chronic conditions and new concerns.  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 She saw pcp last week and states the medication she received is doing well for her. She rates her pain level today at a 3 on a scale of 0-10 in bilateral knees. Denies any acute distress. Will continue to monitor.   Collaborated with appropriate clinical care team members regarding patient needs: 06-09-2020: The patient states that her rolling walker with seat is messed up and ask if the pcp could send a new order in for a new one to [facility].  Will collaborate with pcp and ask if this is something that can be done on behalf of the patient since she was seen in the office recently.   Patient Self Care Activities related to HTN, HLD, Sick Sinus Syndrome, History of CVA,  and CKD 3 :   Patient is unable to independently self-manage chronic health conditions  Please see past updates related to this goal by clicking on the "Past Updates" button in the selected goal       RNCM: I really should increase my activity level       Current Barriers:   Knowledge Deficits related to benefits of regular exercise for patient with Chronic Disease processes  Nurse Case Manager Clinical Goal(s):   Over the next 90 days, patient will verbalize understanding of plan for increasing activity to fully benefit and improve health  maintainence  Over the next 90 days, patient will demonstrate improved adherence to prescribed treatment plan for exercise regimen as evidenced byincreasing activity to at least 20 minutes 4 times a week  Interventions:   Evaluation of current treatment plan related to exercise regimen and patient's adherence to plan as established by provider.  Provided education to patient re: regular, consistent exercise will aide patient in managing chronic health conditions  Evaluation of activity level. The patient states for mothers day her God-daughter gave her vegetable plants to plant and she is going to be working in her little garden getting fresh vegetables this summer.   Reviewed medications with patient and discussed compliance.  The patient comes to see the pcp in July but will need a refill on her plavix and potassium before then. Will let pharmacist and pcp know. She has called the office per the patient.  Discussed plans with patient for ongoing care management follow up and provided patient with  direct contact information for care management team  Advised patient, providing education and rationale, to monitor blood pressure daily and record, calling pcp for findings outside established parameters.   Patient Self Care Activities:   Patient verbalizes understanding of plan to increase activity to maintain mobility and strengthen muscles in patient with chronic health conditions  Attends all scheduled provider appointments  Calls provider office for new concerns or questions  Unable to independently manage routine exercise and activity   Please see past updates related to this goal by clicking on the "Past Updates" button in the selected goal          Plan:   Telephone follow up appointment with care management team member scheduled for:08-04-2020 at 3 pm   [place], MSN, [facility] Mobile:  [phone]

## 2020-06-09 NOTE — Assessment & Plan Note (Signed)
Followed by elecrophysiologist, continue per their recommendations. 

## 2020-06-09 NOTE — Assessment & Plan Note (Signed)
Diet controlled, declines medications. Recheck lipids, continue lifestyle modifications for improved control

## 2020-06-09 NOTE — Assessment & Plan Note (Signed)
BPs stable and under good control, continue losartan and chlorthalidone regimen

## 2020-06-09 NOTE — Assessment & Plan Note (Signed)
Discussed diet and exercise changes  

## 2020-06-11 NOTE — Progress Notes (Signed)
Form placed in Rachel's folder for signature.

## 2020-06-23 ENCOUNTER — Telehealth: Payer: Self-pay | Admitting: Family Medicine

## 2020-06-23 NOTE — Telephone Encounter (Signed)
Copied from [place] [id number]. Topic: General - Inquiry >> Jun 22, 2020  4:35 PM Greggory Keen D wrote: Reason for CRM: Pt called saying on 7/27 the health nurse called and talked with the patient.  At that time there was a request for heavy duty bariatric walker and the patient has not heard anything back .  She would like to know the status of the request   CB#  [phone] >> Jun 23, 2020  9:03 AM Yvette Rack wrote: Pt stated she needs the Rx to be written for an extra large bariatric rollator with a seat.

## 2020-06-23 NOTE — Telephone Encounter (Signed)
Ok to generate order

## 2020-06-24 NOTE — Telephone Encounter (Signed)
Order filled out and placed in folder for signature.

## 2020-06-29 DIAGNOSIS — Z8673 Personal history of transient ischemic attack (TIA), and cerebral infarction without residual deficits: Secondary | ICD-10-CM | POA: Diagnosis not present

## 2020-06-29 DIAGNOSIS — I6322 Cerebral infarction due to unspecified occlusion or stenosis of basilar arteries: Secondary | ICD-10-CM | POA: Diagnosis not present

## 2020-06-29 NOTE — Telephone Encounter (Signed)
Faxed

## 2020-06-30 ENCOUNTER — Encounter: Payer: Self-pay | Admitting: Family Medicine

## 2020-08-04 ENCOUNTER — Telehealth: Payer: Self-pay

## 2020-08-04 NOTE — Chronic Care Management (AMB) (Signed)
  Care Management   Note  08/04/2020 Name: JAMILLE FISHER MRN: [id number] DOB: 26-Jul-1956  ZELENE BARGA is a 64 y.o. year old female who is a primary care patient of Volney American, Hershal Coria and is actively engaged with the care management team. I reached out to [name] by phone today to assist with re-scheduling a follow up visit with the RN Case Manager  Follow up plan: Unsuccessful telephone outreach attempt made. A HIPAA compliant phone message was left for the patient providing contact information and requesting a return call.  The care management team will reach out to the patient again over the next 3 days.  If patient returns call to provider office, please advise to call [facility]  at [place], [place], [place], [address] [address] Direct Dial: [phone] [email or web address]: [email or web address]

## 2020-08-10 NOTE — Chronic Care Management (AMB) (Signed)
  Care Management   Note  08/10/2020 Name: Mary Webb MRN: [id number] DOB: [birth year]  Mary Webb is a 64 y.o. year old female who is a primary care patient of Mary Webb, Mary Webb and is actively engaged with the care management team. I reached out to Mary Webb by phone today to assist with re-scheduling a follow up visit with the RN Case Manager  Follow up plan: Telephone appointment with care management team member scheduled for:10/13/2020  Mary Webb, [place], [place], [place] [address] Direct Dial: [phone] [email or web address] Website: [email or web address]

## 2020-08-14 ENCOUNTER — Other Ambulatory Visit: Payer: Self-pay | Admitting: Nurse Practitioner

## 2020-08-14 ENCOUNTER — Telehealth: Payer: Self-pay

## 2020-08-14 DIAGNOSIS — Z1231 Encounter for screening mammogram for malignant neoplasm of breast: Secondary | ICD-10-CM

## 2020-08-14 NOTE — Telephone Encounter (Signed)
Called and LVM asking for patient to please return my call. Not sure what the issue is here because it looks like the patients mammogram is scheduled for 08/28/20. Last order was entered by [facility].

## 2020-08-14 NOTE — Telephone Encounter (Signed)
Copied from [place] [id number]. Topic: Referral - Status >> Aug 14, 2020  1:38 PM Yvette Rack wrote: Reason for CRM: Pt stated she finally was able to speak with someone at [facility] but she was unable to schedule an appt due to provider no longer coming up in their system. Pt stated she will need a referral from her new pcp sent to [facility].

## 2020-08-28 ENCOUNTER — Ambulatory Visit
Admission: RE | Admit: 2020-08-28 | Discharge: 2020-08-28 | Disposition: A | Discharge status: Home / Self Care | Payer: Medicare Other | Source: Ambulatory Visit | Attending: Nurse Practitioner | Admitting: Nurse Practitioner

## 2020-08-28 ENCOUNTER — Other Ambulatory Visit: Payer: Self-pay

## 2020-08-28 DIAGNOSIS — Z1231 Encounter for screening mammogram for malignant neoplasm of breast: Secondary | ICD-10-CM | POA: Insufficient documentation

## 2020-09-29 DIAGNOSIS — I495 Sick sinus syndrome: Secondary | ICD-10-CM | POA: Diagnosis not present

## 2020-10-13 ENCOUNTER — Telehealth: Payer: Medicare Other

## 2020-10-13 ENCOUNTER — Telehealth: Payer: Self-pay | Admitting: General Practice

## 2020-10-13 NOTE — Telephone Encounter (Signed)
°  Chronic Care Management   Outreach Note  10/13/2020 Name: Mary Webb MRN: [id number] DOB: 10-Mar-1956  Referred by: Volney American, PA-C Reason for referral : Chronic Care Management (RNCM Follow up for Chronic Disease Management and Care Coordination Needs )   A second unsuccessful telephone outreach was attempted today. The patient was referred to the case management team for assistance with care management and care coordination.   Follow Up Plan: A HIPAA compliant phone message was left for the patient providing contact information and requesting a return call.   [name] RN, MSN, [facility] Mobile: [phone]

## 2020-10-29 ENCOUNTER — Telehealth: Payer: Self-pay | Admitting: Family Medicine

## 2020-10-29 NOTE — Telephone Encounter (Signed)
Copied from [place] [phone]. Topic: Medicare AWV >> Oct 29, 2020  9:33 AM Cher Nakai R wrote: Reason for CRM:  No answer... Unable to leave message. Cancelled AWVS scheduled Dec 27,2021 and rescheduled to Nov 16, 2020 at 9:45 to do by phone not in [facility]

## 2020-11-04 NOTE — Telephone Encounter (Signed)
Patient has been r/s  

## 2020-11-09 ENCOUNTER — Ambulatory Visit: Payer: Medicare Other

## 2020-11-16 ENCOUNTER — Ambulatory Visit (INDEPENDENT_AMBULATORY_CARE_PROVIDER_SITE_OTHER): Payer: Medicare Other

## 2020-11-16 VITALS — BP 135/61 | HR 70 | Ht 64.0 in | Wt 375.0 lb

## 2020-11-16 DIAGNOSIS — Z0000 Encounter for general adult medical examination without abnormal findings: Secondary | ICD-10-CM

## 2020-11-16 NOTE — Progress Notes (Signed)
I connected with Mary Webb today by telephone and verified that I am speaking with the correct person using two identifiers. Location patient: home Location provider: work Persons participating in the virtual visit: Mary Webb, Jeck LPN.   I discussed the limitations, risks, security and privacy concerns of performing an evaluation and management service by telephone and the availability of in person appointments. I also discussed with the patient that there may be a patient responsible charge related to this service. The patient expressed understanding and verbally consented to this telephonic visit.    Interactive audio and video telecommunications were attempted between this provider and patient, however failed, due to patient having technical difficulties OR patient did not have access to video capability.  We continued and completed visit with audio only.     Vital signs may be patient reported or missing.  Subjective:   Mary Webb is a 65 y.o. female who presents for Medicare Annual (Subsequent) preventive examination.  Review of Systems     Cardiac Risk Factors include: advanced age (>20men, >77 women);dyslipidemia;hypertension;obesity (BMI >30kg/m2);sedentary lifestyle     Objective:    Today's Vitals   11/16/20 0941  BP: 135/61  Pulse: 70  Weight: (!) 375 lb (170.1 kg)  Height: 5\' 4"  (1.626 m)   Body mass index is 64.37 kg/m.  Advanced Directives 11/16/2020 11/06/2019 11/01/2018 10/26/2017  Does Patient Have a Medical Advance Directive? No No No No  Would patient like information on creating a medical advance directive? - Yes (MAU/Ambulatory/Procedural Areas - Information given) Yes (MAU/Ambulatory/Procedural Areas - Information given) Yes (MAU/Ambulatory/Procedural Areas - Information given)    Current Medications (verified) Outpatient Encounter Medications as of 11/16/2020  Medication Sig  . Ascorbic Acid (VITAMIN C) 1000 MG tablet Take by  mouth. Every other day  . Calcium Carb-Cholecalciferol (CALCIUM 500 + D3 PO) Take by mouth daily.  . chlorthalidone (HYGROTON) 25 MG tablet Take 1 tablet (25 mg total) by mouth daily.  . Cholecalciferol (VITAMIN D3) 1000 units CAPS Take by mouth. Taking once a week  . clopidogrel (PLAVIX) 75 MG tablet Take 1 tablet (75 mg total) by mouth daily.  . Cyanocobalamin (VITAMIN B 12) 500 MCG TABS Take by mouth daily.  . diclofenac Sodium (VOLTAREN) 1 % GEL Apply 2 g topically 4 (four) times daily.  Marland Kitchen losartan (COZAAR) 100 MG tablet Take 1 tablet (100 mg total) by mouth daily.  . potassium chloride SA (KLOR-CON) 20 MEQ tablet Take 1 tablet (20 mEq total) by mouth 2 (two) times daily.  Marland Kitchen pyridOXINE (VITAMIN B-6) 25 MG tablet Take by mouth daily.    No facility-administered encounter medications on file as of 11/16/2020.    Allergies (verified) Levaquin [levofloxacin], Sulfur, Tekturna [aliskiren], Accupril [quinapril hcl], Aspirin, Diovan [valsartan], Erythromycin, Iodine, Latex, Lotensin [benazepril], Norvasc [amlodipine], Shellfish allergy, Sulfa antibiotics, Tetanus toxoids, Tetracycline, Tetracyclines & related, and Penicillins   History: Past Medical History:  Diagnosis Date  . Arthritis   . CAD (coronary artery disease)   . CKD (chronic kidney disease)   . CVA (cerebral infarction)    L sided  . Dysrhythmia   . Hyperlipidemia   . Lymphadenopathy   . Pituitary adenoma ([place])   . Presence of permanent cardiac pacemaker   . Recurrent depression ([place])   . Shortness of breath dyspnea   . Sick sinus syndrome ([place])   . Stroke [facility])    Past Surgical History:  Procedure Laterality Date  . ABDOMINAL HYSTERECTOMY    . COLONOSCOPY WITH  PROPOFOL N/A 01/05/2016   Procedure: COLONOSCOPY WITH PROPOFOL;  Surgeon: Lucilla Lame, MD;  Location: [facility] ENDOSCOPY;  Service: Endoscopy;  Laterality: N/A;  . PACEMAKER INSERTION  2015   Dr. Josefa Half   Family History  Problem Relation Age of Onset  .  Hypertension Mother   . Hypertension Father   . Diabetes Father        under control  . Hypertension Maternal Grandmother   . Depression Maternal Grandmother   . Hypertension Maternal Grandfather   . Hypertension Paternal Grandmother   . Hypertension Paternal Grandfather   . Thyroid disease Maternal Aunt    Social History   Socioeconomic History  . Marital status: Married    Spouse name: Not on file  . Number of children: Not on file  . Years of education: Not on file  . Highest education level: Not on file  Occupational History  . Not on file  Tobacco Use  . Smoking status: Never Smoker  . Smokeless tobacco: Never Used  Vaping Use  . Vaping Use: Never used  Substance and Sexual Activity  . Alcohol use: No  . Drug use: No  . Sexual activity: Not on file  Other Topics Concern  . Not on file  Social History Narrative  . Not on file   Social Determinants of Health   Financial Resource Strain: Low Risk   . Difficulty of Paying Living Expenses: Not hard at all  Food Insecurity: No Food Insecurity  . Worried About Charity fundraiser in the Last Year: Never true  . Ran Out of Food in the Last Year: Never true  Transportation Needs: No Transportation Needs  . Lack of Transportation (Medical): No  . Lack of Transportation (Non-Medical): No  Physical Activity: Insufficiently Active  . Days of Exercise per Week: 7 days  . Minutes of Exercise per Session: 10 min  Stress: No Stress Concern Present  . Feeling of Stress : Not at all  Social Connections: Not on file    Tobacco Counseling Counseling given: Not Answered   Clinical Intake:  Pre-visit preparation completed: Yes  Pain : No/denies pain     Nutritional Status: BMI > 30  Obese Nutritional Risks: None Diabetes: No  How often do you need to have someone help you when you read instructions, pamphlets, or other written materials from your doctor or pharmacy?: 1 - Never What is the last grade level you  completed in school?: 40yrs college  Diabetic? no  Interpreter Needed?: No  Information entered by :: NAllen LPN   Activities of Daily Living In your present state of health, do you have any difficulty performing the following activities: 11/16/2020 12/04/2019  Hearing? N N  Vision? N N  Difficulty concentrating or making decisions? N N  Walking or climbing stairs? Y Y  Comment due to knee -  Dressing or bathing? N N  Doing errands, shopping? N Y  Conservation officer, nature and eating ? N -  Using the Toilet? N -  In the past six months, have you accidently leaked urine? Y -  Do you have problems with loss of bowel control? N -  Managing your Medications? N -  Managing your Finances? N -  Housekeeping or managing your Housekeeping? N -  Some recent data might be hidden    Patient Care Team: Volney American, PA-C as PCP - General (Family Medicine) Yolonda Kida, MD as Consulting Physician (Cardiology) [name], RN as Case Manager ([place])  Indicate any recent Medical Services you may have received from other than [facility] providers in the past year (date may be approximate).     Assessment:   This is a routine wellness examination for Amye.  Hearing/Vision screen  Hearing Screening   125Hz  250Hz  500Hz  1000Hz  2000Hz  3000Hz  4000Hz  6000Hz  8000Hz   Right ear:           Left ear:           Vision Screening Comments: Regular eye exams, Dr.  Dietary issues and exercise activities discussed: Current Exercise Habits: Home exercise routine, Type of exercise: stretching, Time (Minutes): 10, Frequency (Times/Week): 7, Weekly Exercise (Minutes/Week): 70  Goals    . DIET - INCREASE WATER INTAKE     Recommend drinking at least 6-7 glasses of water a day     . Patient Stated     11/16/2020, wants to get around better    . RNCM: I have a lot of health problems     Current Barriers:  . Chronic Disease Management support, education, and care coordination needs  related to HTN, HLD, Sick Sinus Syndrome, History of CVA,  and CKD 3  Clinical Goal(s) related to HTN, HLD, Sick Sinus Syndrome, History of CVA,  and CKD 3:  Over the next 90 days, patient will:  . Work with the care management team to address educational, disease management, and care coordination needs  . Begin or continue self health monitoring activities as directed today Measure and record blood pressure 5 times per week . Call provider office for new or worsened signs and symptoms Blood pressure findings outside established parameters, Shortness of breath, and New or worsened symptom related to Sick Sinus Syndrome CKD3 and new s/s related to CVA . Call care management team with questions or concerns . Verbalize basic understanding of patient centered plan of care established today  Interventions related to HTN, HLD, Sick Sinus Syndrome, History of CVA,  and CKD 3 :  . Evaluation of current treatment plans and patient's adherence to plan as established by provider.  The patient states she is doing well. 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 She also needs a mammogram but was advised after her 2nd covid vaccine to wait at least 7 weeks. She will have that done in July. 06-09-2020: the patient states she decided not to have the MRI done. She does need to still get her mammogram done. Provided the number for [facility] at [phone] so the patient could call and schedule her mammogram.  . Assessed patient understanding of disease states.  The patient has a good understanding of her chronic conditions and feels she is doing well currently with managing her health and well being. . Assessed patient's education and care coordination needs.  The patient denies any needs at this time. Education given on Heart healthy diet. The patient is eating  more fruits and vegetables. The patient states that she has vegetables to plant that her God-daughter gave her and she will have fresh vegetables to eat.  . Provided disease specific education to patient. Review of chronic conditions and new concerns.  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 She saw pcp last week and states the medication she received is doing  well for her. She rates her pain level today at a 3 on a scale of 0-10 in bilateral knees. Denies any acute distress. Will continue to monitor.  Steele Sizer with appropriate clinical care team members regarding patient needs: 06-09-2020: The patient states that her rolling walker with seat is messed up and ask if the pcp could send a new order in for a new one to [facility].  Will collaborate with pcp and ask if this is something that can be done on behalf of the patient since she was seen in the office recently.   Patient Self Care Activities related to HTN, HLD, Sick Sinus Syndrome, History of CVA,  and CKD 3 :  . Patient is unable to independently self-manage chronic health conditions  Please see past updates related to this goal by clicking on the "Past Updates" button in the selected goal      . RNCM: I really should increase my activity level     Current Barriers:  Marland Kitchen Knowledge Deficits related to benefits of regular exercise for patient with Chronic Disease processes  Nurse Case Manager Clinical Goal(s):  Marland Kitchen Over the next 90 days, patient will verbalize understanding of plan for increasing activity to fully benefit and improve health maintainence . Over the next 90 days, patient will demonstrate improved adherence to prescribed treatment plan for exercise regimen as evidenced byincreasing activity to at least 20 minutes 4 times a week  Interventions:  . Evaluation of current treatment plan related to exercise regimen and patient's adherence to plan as  established by provider. . Provided education to patient re: regular, consistent exercise will aide patient in managing chronic health conditions . Evaluation of activity level. The patient states for mothers day her God-daughter gave her vegetable plants to plant and she is going to be working in her little garden getting fresh vegetables this summer.  . Reviewed medications with patient and discussed compliance.  The patient comes to see the pcp in July but will need a refill on her plavix and potassium before then. Will let pharmacist and pcp know. She has called the office per the patient. . Discussed plans with patient for ongoing care management follow up and provided patient with direct contact information for care management team . Advised patient, providing education and rationale, to monitor blood pressure daily and record, calling pcp for findings outside established parameters.   Patient Self Care Activities:  . Patient verbalizes understanding of plan to increase activity to maintain mobility and strengthen muscles in patient with chronic health conditions . Attends all scheduled provider appointments . Calls provider office for new concerns or questions . Unable to independently manage routine exercise and activity   Please see past updates related to this goal by clicking on the "Past Updates" button in the selected goal        Depression Screen PHQ 2/9 Scores 11/16/2020 12/04/2019 11/06/2019 11/06/2019 12/20/2018 11/01/2018 05/02/2018  PHQ - 2 Score 0 0 0 0 2 0 0  PHQ- 9 Score 0 0 - - 3 - -    Fall Risk Fall Risk  11/16/2020 12/04/2019 11/06/2019 11/01/2018 05/02/2018  Falls in the past year? 0 1 1 0 No  Number falls in past yr: - 0 0 - -  Injury with Fall? - 0 0 - -  Risk for fall due to : Impaired mobility;Medication side effect - - - -  Follow up Falls evaluation completed;Education provided;Falls prevention discussed - - - -  FALL RISK PREVENTION PERTAINING TO THE  HOME:  Any stairs in or around the home? Yes  If so, are there any without handrails? Yes  Home free of loose throw rugs in walkways, pet beds, electrical cords, etc? Yes  Adequate lighting in your home to reduce risk of falls? Yes   ASSISTIVE DEVICES UTILIZED TO PREVENT FALLS:  Life alert? No  Use of a cane, walker or w/c? Yes  Grab bars in the bathroom? No  Shower chair or bench in shower? No  Elevated toilet seat or a handicapped toilet? Yes   TIMED UP AND GO:  Was the test performed? No   Cognitive Function:     6CIT Screen 11/16/2020 11/01/2018 10/26/2017  What Year? 0 points 0 points 0 points  What month? 0 points 0 points 0 points  What time? 0 points 0 points 0 points  Count back from 20 0 points 0 points 0 points  Months in reverse 0 points 0 points 0 points  Repeat phrase 6 points 0 points 2 points  Total Score 6 0 2    Immunizations Immunization History  Administered Date(s) Administered  . PFIZER SARS-COV-2 Vaccination 02/01/2020, 02/22/2020  . Pneumococcal Conjugate-13 06/03/2014  . Pneumococcal Polysaccharide-23 08/07/2007  . Td 07/18/2006    TDAP status: allergy  Flu Vaccine status: Declined, Education has been provided regarding the importance of this vaccine but patient still declined. Advised may receive this vaccine at local pharmacy or Health Dept. Aware to provide a copy of the vaccination record if obtained from local pharmacy or Health Dept. Verbalized acceptance and understanding.  Pneumococcal vaccine status: Up to date  Covid-19 vaccine status: Completed vaccines  Qualifies for Shingles Vaccine? Yes   Zostavax completed No   Shingrix Completed?: No.    Education has been provided regarding the importance of this vaccine. Patient has been advised to call insurance company to determine out of pocket expense if they have not yet received this vaccine. Advised may also receive vaccine at local pharmacy or Health Dept. Verbalized acceptance and  understanding.  Screening Tests Health Maintenance  Topic Date Due  . DEXA SCAN  Never done  . PNA vac Low Risk Adult (2 of 2 - PPSV23) 05/15/2020  . COVID-19 Vaccine (3 - Booster for Pfizer series) 08/23/2020  . MAMMOGRAM  08/28/2022  . COLONOSCOPY (Pts 45-105yrs Insurance coverage will need to be confirmed)  01/04/2026  . Hepatitis C Screening  Completed  . HIV Screening  Completed  . INFLUENZA VACCINE  Discontinued  . TETANUS/TDAP  Discontinued    Health Maintenance  Health Maintenance Due  Topic Date Due  . DEXA SCAN  Never done  . PNA vac Low Risk Adult (2 of 2 - PPSV23) 05/15/2020  . COVID-19 Vaccine (3 - Booster for Pfizer series) 08/23/2020    Colorectal cancer screening: Type of screening: Colonoscopy. Completed 01/05/2016. Repeat every 10 years  Mammogram status: Completed 08/28/2020. Repeat every year  Bone Density status: Completed 11/09/2010.  Lung Cancer Screening: (Low Dose CT Chest recommended if Age 57-80 years, 30 pack-year currently smoking OR have quit w/in 15years.) does not qualify.   Lung Cancer Screening Referral: no  Additional Screening:  Hepatitis C Screening: does qualify; Completed 10/26/2017  Vision Screening: Recommended annual ophthalmology exams for early detection of glaucoma and other disorders of the eye. Is the patient up to date with their annual eye exam?  Yes  Who is the provider or what is the name of the office in which  the patient attends annual eye exams? Dr. Ellin Mayhew If pt is not established with a provider, would they like to be referred to a provider to establish care? No .   Dental Screening: Recommended annual dental exams for proper oral hygiene  Community Resource Referral / Chronic Care Management: CRR required this visit?  No   CCM required this visit?  No      Plan:     I have personally reviewed and noted the following in the patient's chart:   . Medical and social history . Use of alcohol, tobacco or  illicit drugs  . Current medications and supplements . Functional ability and status . Nutritional status . Physical activity . Advanced directives . List of other physicians . Hospitalizations, surgeries, and ER visits in previous 12 months . Vitals . Screenings to include cognitive, depression, and falls . Referrals and appointments  In addition, I have reviewed and discussed with patient certain preventive protocols, quality metrics, and best practice recommendations. A written personalized care plan for preventive services as well as general preventive health recommendations were provided to patient.     Kellie Simmering, LPN   [id number]   Nurse Notes:

## 2020-11-16 NOTE — Patient Instructions (Signed)
[name] , Thank you for taking time to come for your Medicare Wellness Visit. I appreciate your ongoing commitment to your health goals. Please review the following plan we discussed and let me know if I can assist you in the future.   Screening recommendations/referrals: Colonoscopy: completed 01/05/2016, due 01/04/2026 Mammogram: completed 08/28/2020, due 08/28/2021 Bone Density: completed 11/09/2010 Recommended yearly ophthalmology/optometry visit for glaucoma screening and checkup Recommended yearly dental visit for hygiene and checkup  Vaccinations: Influenza vaccine: decline Pneumococcal vaccine: completed 06/03/2014 Tdap vaccine: allergy Shingles vaccine: discussed   Covid-19:  02/01/2020, 02/22/2020, 11/04/2020  Advanced directives: Advance directive discussed with you today.   Conditions/risks identified: none  Next appointment: Follow up in one year for your annual wellness visit    Preventive Care 65 Years and Older, Female Preventive care refers to lifestyle choices and visits with your health care provider that can promote health and wellness. What does preventive care include?  A yearly physical exam. This is also called an annual well check.  Dental exams once or twice a year.  Routine eye exams. Ask your health care provider how often you should have your eyes checked.  Personal lifestyle choices, including:  Daily care of your teeth and gums.  Regular physical activity.  Eating a healthy diet.  Avoiding tobacco and drug use.  Limiting alcohol use.  Practicing safe sex.  Taking low-dose aspirin every day.  Taking vitamin and mineral supplements as recommended by your health care provider. What happens during an annual well check? The services and screenings done by your health care provider during your annual well check will depend on your age, overall health, lifestyle risk factors, and family history of disease. Counseling  Your health care  provider may ask you questions about your:  Alcohol use.  Tobacco use.  Drug use.  Emotional well-being.  Home and relationship well-being.  Sexual activity.  Eating habits.  History of falls.  Memory and ability to understand (cognition).  Work and work Astronomer.  Reproductive health. Screening  You may have the following tests or measurements:  Height, weight, and BMI.  Blood pressure.  Lipid and cholesterol levels. These may be checked every 5 years, or more frequently if you are over 34 years old.  Skin check.  Lung cancer screening. You may have this screening every year starting at age 71 if you have a 30-pack-year history of smoking and currently smoke or have quit within the past 15 years.  Fecal occult blood test (FOBT) of the stool. You may have this test every year starting at age 57.  Flexible sigmoidoscopy or colonoscopy. You may have a sigmoidoscopy every 5 years or a colonoscopy every 10 years starting at age 2.  Hepatitis C blood test.  Hepatitis B blood test.  Sexually transmitted disease (STD) testing.  Diabetes screening. This is done by checking your blood sugar (glucose) after you have not eaten for a while (fasting). You may have this done every 1-3 years.  Bone density scan. This is done to screen for osteoporosis. You may have this done starting at age 39.  Mammogram. This may be done every 1-2 years. Talk to your health care provider about how often you should have regular mammograms. Talk with your health care provider about your test results, treatment options, and if necessary, the need for more tests. Vaccines  Your health care provider may recommend certain vaccines, such as:  Influenza vaccine. This is recommended every year.  Tetanus, diphtheria, and acellular pertussis (  Tdap, Td) vaccine. You may need a Td booster every 10 years.  Zoster vaccine. You may need this after age 66.  Pneumococcal 13-valent conjugate (PCV13)  vaccine. One dose is recommended after age 19.  Pneumococcal polysaccharide (PPSV23) vaccine. One dose is recommended after age 43. Talk to your health care provider about which screenings and vaccines you need and how often you need them. This information is not intended to replace advice given to you by your health care provider. Make sure you discuss any questions you have with your health care provider. Document Released: 11/27/2015 Document Revised: 07/20/2016 Document Reviewed: 09/01/2015 Elsevier Interactive Patient Education  2017 [employer] Prevention in the Home Falls can cause injuries. They can happen to people of all ages. There are many things you can do to make your home safe and to help prevent falls. What can I do on the outside of my home?  Regularly fix the edges of walkways and driveways and fix any cracks.  Remove anything that might make you trip as you walk through a door, such as a raised step or threshold.  Trim any bushes or trees on the path to your home.  Use bright outdoor lighting.  Clear any walking paths of anything that might make someone trip, such as rocks or tools.  Regularly check to see if handrails are loose or broken. Make sure that both sides of any steps have handrails.  Any raised decks and porches should have guardrails on the edges.  Have any leaves, snow, or ice cleared regularly.  Use sand or salt on walking paths during winter.  Clean up any spills in your garage right away. This includes oil or grease spills. What can I do in the bathroom?  Use night lights.  Install grab bars by the toilet and in the tub and shower. Do not use towel bars as grab bars.  Use non-skid mats or decals in the tub or shower.  If you need to sit down in the shower, use a plastic, non-slip stool.  Keep the floor dry. Clean up any water that spills on the floor as soon as it happens.  Remove soap buildup in the tub or shower  regularly.  Attach bath mats securely with double-sided non-slip rug tape.  Do not have throw rugs and other things on the floor that can make you trip. What can I do in the bedroom?  Use night lights.  Make sure that you have a light by your bed that is easy to reach.  Do not use any sheets or blankets that are too big for your bed. They should not hang down onto the floor.  Have a firm chair that has side arms. You can use this for support while you get dressed.  Do not have throw rugs and other things on the floor that can make you trip. What can I do in the kitchen?  Clean up any spills right away.  Avoid walking on wet floors.  Keep items that you use a lot in easy-to-reach places.  If you need to reach something above you, use a strong step stool that has a grab bar.  Keep electrical cords out of the way.  Do not use floor polish or wax that makes floors slippery. If you must use wax, use non-skid floor wax.  Do not have throw rugs and other things on the floor that can make you trip. What can I do with my stairs?  Do  not leave any items on the stairs.  Make sure that there are handrails on both sides of the stairs and use them. Fix handrails that are broken or loose. Make sure that handrails are as long as the stairways.  Check any carpeting to make sure that it is firmly attached to the stairs. Fix any carpet that is loose or worn.  Avoid having throw rugs at the top or bottom of the stairs. If you do have throw rugs, attach them to the floor with carpet tape.  Make sure that you have a light switch at the top of the stairs and the bottom of the stairs. If you do not have them, ask someone to add them for you. What else can I do to help prevent falls?  Wear shoes that:  Do not have high heels.  Have rubber bottoms.  Are comfortable and fit you well.  Are closed at the toe. Do not wear sandals.  If you use a stepladder:  Make sure that it is fully  opened. Do not climb a closed stepladder.  Make sure that both sides of the stepladder are locked into place.  Ask someone to hold it for you, if possible.  Clearly mark and make sure that you can see:  Any grab bars or handrails.  First and last steps.  Where the edge of each step is.  Use tools that help you move around (mobility aids) if they are needed. These include:  Canes.  Walkers.  Scooters.  Crutches.  Turn on the lights when you go into a dark area. Replace any light bulbs as soon as they burn out.  Set up your furniture so you have a clear path. Avoid moving your furniture around.  If any of your floors are uneven, fix them.  If there are any pets around you, be aware of where they are.  Review your medicines with your doctor. Some medicines can make you feel dizzy. This can increase your chance of falling. Ask your doctor what other things that you can do to help prevent falls. This information is not intended to replace advice given to you by your health care provider. Make sure you discuss any questions you have with your health care provider. Document Released: 08/27/2009 Document Revised: 04/07/2016 Document Reviewed: 12/05/2014 Elsevier Interactive Patient Education  2017 [employer].

## 2020-12-10 ENCOUNTER — Encounter: Payer: Medicare Other | Admitting: Family Medicine

## 2020-12-15 ENCOUNTER — Encounter: Payer: Medicare Other | Admitting: Nurse Practitioner

## 2020-12-28 DIAGNOSIS — E119 Type 2 diabetes mellitus without complications: Secondary | ICD-10-CM | POA: Insufficient documentation

## 2020-12-28 DIAGNOSIS — R7303 Prediabetes: Secondary | ICD-10-CM | POA: Insufficient documentation

## 2020-12-28 NOTE — Assessment & Plan Note (Signed)
No new neurologic issues. Continue plavix and lifestyle modifications.

## 2020-12-28 NOTE — Progress Notes (Signed)
BP 130/76   Pulse 71   Temp 98.3 F (36.8 C) (Oral)   Ht 5' 2.21" (1.58 m)   Wt (!) 379 lb 12.8 oz (172.3 kg)   SpO2 97%   BMI 69.01 kg/m    Subjective:    Patient ID: Mary Webb, female    DOB: 1956/07/21, 65 y.o.   MRN: [id number]  HPI: Mary Webb is a 64 y.o. female presenting on 12/29/2020 for comprehensive medical examination. Current medical complaints include:none  She currently lives with: Logan Bores (Husband) Menopausal Symptoms: no  HYPERTENSION / HYPERLIPIDEMIA Satisfied with current treatment? no Duration of hypertension: years BP monitoring frequency: a few times a week BP range: 110-117-50s BP medication side effects: no Past BP meds: chlorthalidone and losartan (cozaar) Duration of hyperlipidemia: years Cholesterol medication side effects: no Cholesterol supplements: none Past cholesterol medications: none Medication compliance: excellent compliance Aspirin: no Patient takes Plavix Recent stressors: no Recurrent headaches: no Visual changes: no Palpitations: no Dyspnea: yes Chest pain: no Lower extremity edema: no Dizzy/lightheaded: no  Depression Screen done today and results listed below:  Depression screen [facility] 2/9 12/29/2020 11/16/2020 12/04/2019 11/06/2019 11/06/2019  Decreased Interest 0 0 0 0 0  Down, Depressed, Hopeless 0 0 0 0 0  PHQ - 2 Score 0 0 0 0 0  Altered sleeping 0 0 0 - -  Tired, decreased energy 0 0 0 - -  Change in appetite 0 0 0 - -  Feeling bad or failure about yourself  0 0 0 - -  Trouble concentrating 0 0 0 - -  Moving slowly or fidgety/restless 0 0 0 - -  Suicidal thoughts 0 0 0 - -  PHQ-9 Score 0 0 0 - -  Difficult doing work/chores - Not difficult at all - - -    The patient does not have a history of falls. I did complete a risk assessment for falls. A plan of care for falls was not documented.   Past Medical History:  Past Medical History:  Diagnosis Date  . Arthritis   . CAD (coronary artery  disease)   . CKD (chronic kidney disease)   . CVA (cerebral infarction)    L sided  . Dysrhythmia   . Hyperlipidemia   . Lymphadenopathy   . Pituitary adenoma ([place])   . Presence of permanent cardiac pacemaker   . Recurrent depression ([place])   . Shortness of breath dyspnea   . Sick sinus syndrome ([place])   . Stroke [facility])     Surgical History:  Past Surgical History:  Procedure Laterality Date  . ABDOMINAL HYSTERECTOMY    . COLONOSCOPY WITH PROPOFOL N/A 01/05/2016   Procedure: COLONOSCOPY WITH PROPOFOL;  Surgeon: Lucilla Lame, MD;  Location: [facility] ENDOSCOPY;  Service: Endoscopy;  Laterality: N/A;  . PACEMAKER INSERTION  2015   Dr. Josefa Half    Medications:  Current Outpatient Medications on File Prior to Visit  Medication Sig  . Ascorbic Acid (VITAMIN C) 1000 MG tablet Take by mouth. Every other day  . Cholecalciferol (VITAMIN D3) 1000 units CAPS Take by mouth. Taking once a week  . Cyanocobalamin (VITAMIN B 12) 500 MCG TABS Take by mouth daily.  Marland Kitchen pyridOXINE (VITAMIN B-6) 25 MG tablet Take by mouth daily.   . Calcium Carb-Cholecalciferol (CALCIUM 500 + D3 PO) Take by mouth daily. (Patient not taking: Reported on 12/29/2020)   No current facility-administered medications on file prior to visit.    Allergies:  Allergies  Allergen Reactions  .  Elemental Sulfur Anaphylaxis  . Levaquin [Levofloxacin] Other (See Comments)    Stroke  . Tekturna [Aliskiren] Shortness Of Breath  . Accupril [Quinapril Hcl]   . Aspirin   . Diovan [Valsartan]   . Erythromycin   . Iodine   . Latex     Can't remember reaction   . Lotensin [Benazepril]   . Norvasc [Amlodipine]   . Shellfish Allergy Swelling  . Sulfa Antibiotics   . Tetanus Toxoids   . Tetracycline     Other reaction(s): Unknown  . Tetracyclines & Related   . Penicillins Rash    Social History:  Social History   Socioeconomic History  . Marital status: Married    Spouse name: Not on file  . Number of children: Not on file   . Years of education: Not on file  . Highest education level: Not on file  Occupational History  . Not on file  Tobacco Use  . Smoking status: Never Smoker  . Smokeless tobacco: Never Used  Vaping Use  . Vaping Use: Never used  Substance and Sexual Activity  . Alcohol use: No  . Drug use: No  . Sexual activity: Not on file  Other Topics Concern  . Not on file  Social History Narrative  . Not on file   Social Determinants of Health   Financial Resource Strain: Low Risk   . Difficulty of Paying Living Expenses: Not hard at all  Food Insecurity: No Food Insecurity  . Worried About Charity fundraiser in the Last Year: Never true  . Ran Out of Food in the Last Year: Never true  Transportation Needs: No Transportation Needs  . Lack of Transportation (Medical): No  . Lack of Transportation (Non-Medical): No  Physical Activity: Insufficiently Active  . Days of Exercise per Week: 7 days  . Minutes of Exercise per Session: 10 min  Stress: No Stress Concern Present  . Feeling of Stress : Not at all  Social Connections: Not on file  Intimate Partner Violence: Not on file   Social History   Tobacco Use  Smoking Status Never Smoker  Smokeless Tobacco Never Used   Social History   Substance and Sexual Activity  Alcohol Use No    Family History:  Family History  Problem Relation Age of Onset  . Hypertension Mother   . Hypertension Father   . Diabetes Father        under control  . Hypertension Maternal Grandmother   . Depression Maternal Grandmother   . Hypertension Maternal Grandfather   . Hypertension Paternal Grandmother   . Hypertension Paternal Grandfather   . Thyroid disease Maternal Aunt     Past medical history, surgical history, medications, allergies, family history and social history reviewed with patient today and changes made to appropriate areas of the chart.   Review of Systems  Eyes: Negative for blurred vision and double vision.  Respiratory:  Negative for shortness of breath.   Cardiovascular: Negative for chest pain, palpitations and leg swelling.  Neurological: Negative for dizziness and headaches.   All other ROS negative except what is listed above and in the HPI.      Objective:    BP 130/76   Pulse 71   Temp 98.3 F (36.8 C) (Oral)   Ht 5' 2.21" (1.58 m)   Wt (!) 379 lb 12.8 oz (172.3 kg)   SpO2 97%   BMI 69.01 kg/m   Wt Readings from Last 3 Encounters:  12/29/20 (!) 379  lb 12.8 oz (172.3 kg)  11/16/20 (!) 375 lb (170.1 kg)  06/03/20 (!) 380 lb (172.4 kg)    Physical Exam Vitals and nursing note reviewed.  Constitutional:      General: She is awake. She is not in acute distress.    Appearance: She is well-developed. She is obese. She is not ill-appearing.  HENT:     Head: Normocephalic and atraumatic.     Right Ear: Hearing, tympanic membrane, ear canal and external ear normal. No drainage.     Left Ear: Hearing, tympanic membrane, ear canal and external ear normal. No drainage.     Nose: Nose normal.     Right Sinus: No maxillary sinus tenderness or frontal sinus tenderness.     Left Sinus: No maxillary sinus tenderness or frontal sinus tenderness.     Mouth/Throat:     Mouth: Mucous membranes are moist.     Pharynx: Oropharynx is clear. Uvula midline. No pharyngeal swelling, oropharyngeal exudate or posterior oropharyngeal erythema.  Eyes:     General: Lids are normal.        Right eye: No discharge.        Left eye: No discharge.     Extraocular Movements: Extraocular movements intact.     Conjunctiva/sclera: Conjunctivae normal.     Pupils: Pupils are equal, round, and reactive to light.     Visual Fields: Right eye visual fields normal and left eye visual fields normal.  Neck:     Thyroid: No thyromegaly.     Vascular: No carotid bruit.     Trachea: Trachea normal.  Cardiovascular:     Rate and Rhythm: Normal rate and regular rhythm.     Heart sounds: Normal heart sounds. No murmur heard. No  gallop.   Pulmonary:     Effort: Pulmonary effort is normal. No accessory muscle usage or respiratory distress.     Breath sounds: Normal breath sounds.  Chest:  Breasts:     Right: Normal. No axillary adenopathy or supraclavicular adenopathy.     Left: Normal. No axillary adenopathy or supraclavicular adenopathy.    Abdominal:     General: Bowel sounds are normal.     Palpations: Abdomen is soft. There is no hepatomegaly or splenomegaly.     Tenderness: There is no abdominal tenderness.  Musculoskeletal:        General: Normal range of motion.     Cervical back: Normal range of motion and neck supple.     Right lower leg: No edema.     Left lower leg: No edema.  Lymphadenopathy:     Head:     Right side of head: No submental, submandibular, tonsillar, preauricular or posterior auricular adenopathy.     Left side of head: No submental, submandibular, tonsillar, preauricular or posterior auricular adenopathy.     Cervical: No cervical adenopathy.     Upper Body:     Right upper body: No supraclavicular, axillary or pectoral adenopathy.     Left upper body: No supraclavicular, axillary or pectoral adenopathy.  Skin:    General: Skin is warm and dry.     Capillary Refill: Capillary refill takes less than 2 seconds.     Findings: No rash.  Neurological:     Mental Status: She is alert and oriented to person, place, and time.     Cranial Nerves: Cranial nerves are intact.     Gait: Gait is intact.     Deep Tendon Reflexes: Reflexes are normal and  symmetric.     Reflex Scores:      Brachioradialis reflexes are 2+ on the right side and 2+ on the left side.      Patellar reflexes are 2+ on the right side and 2+ on the left side. Psychiatric:        Attention and Perception: Attention normal.        Mood and Affect: Mood normal.        Speech: Speech normal.        Behavior: Behavior normal. Behavior is cooperative.        Thought Content: Thought content normal.        Judgment:  Judgment normal.     Results for orders placed or performed in visit on 06/03/20  Comprehensive metabolic panel  Result Value Ref Range   Glucose 110 (H) 65 - 99 mg/dL   BUN 14 8 - 27 mg/dL   Creatinine, Ser 1.05 (H) 0.57 - 1.00 mg/dL   GFR calc non Af Amer 56 (L) >59 mL/min/1.73   GFR calc Af Amer 64 >59 mL/min/1.73   BUN/Creatinine Ratio 13 12 - 28   Sodium 139 134 - 144 mmol/L   Potassium 4.0 3.5 - 5.2 mmol/L   Chloride 100 96 - 106 mmol/L   CO2 27 20 - 29 mmol/L   Calcium 9.7 8.7 - 10.3 mg/dL   Total Protein 7.8 6.0 - 8.5 g/dL   Albumin 4.2 3.8 - 4.8 g/dL   Globulin, Total 3.6 1.5 - 4.5 g/dL   Albumin/Globulin Ratio 1.2 1.2 - 2.2   Bilirubin Total 0.5 0.0 - 1.2 mg/dL   Alkaline Phosphatase 82 48 - 121 IU/L   AST 19 0 - 40 IU/L   ALT 20 0 - 32 IU/L  Lipid Panel w/o Chol/HDL Ratio  Result Value Ref Range   Cholesterol, Total 172 100 - 199 mg/dL   Triglycerides 102 0 - 149 mg/dL   HDL 50 >39 mg/dL   VLDL Cholesterol Cal 19 5 - 40 mg/dL   LDL Chol Calc (NIH) 103 (H) 0 - 99 mg/dL  HgB A1c  Result Value Ref Range   Hgb A1c MFr Bld 6.3 (H) 4.8 - 5.6 %   Est. average glucose Bld gHb Est-mCnc 134 mg/dL      Assessment & Plan:   Problem List Items Addressed This Visit      Cardiovascular and Mediastinum   Sick sinus syndrome (HCC) (Chronic)    Followed by elecrophysiologist, continue per their recommendations.      Relevant Medications   chlorthalidone (HYGROTON) 25 MG tablet   clopidogrel (PLAVIX) 75 MG tablet   losartan (COZAAR) 100 MG tablet   Hypertension    BPs stable and under good control, continue losartan and chlorthalidone regimen.  Refills given today.      Relevant Medications   chlorthalidone (HYGROTON) 25 MG tablet   losartan (COZAAR) 100 MG tablet     Endocrine   Pituitary adenoma (HCC) (Chronic)    No new neurologic issues. Continue plavix and lifestyle modifications.        Genitourinary   Hypertensive kidney disease with CKD stage III  (HCC)    BPs stable and under good control, continue losartan and chlorthalidone regimen.  Labs ordered today. Refills given today.      Relevant Medications   chlorthalidone (HYGROTON) 25 MG tablet   losartan (COZAAR) 100 MG tablet     Other   Hyperlipidemia (Chronic)    Lipids ordered at visit today.  Recommend a low fat diet.  Patient declines starting cholesterol medication despite elevated levels.       Relevant Medications   chlorthalidone (HYGROTON) 25 MG tablet   losartan (COZAAR) 100 MG tablet   History of CVA (cerebrovascular accident)    No new neurologic issues. Continue plavix and lifestyle modifications      Relevant Medications   clopidogrel (PLAVIX) 75 MG tablet   Adult body mass index 60.0-69.9 (HCC)    Discussed diet and exercise changes       Prediabetes    Stable.  A1c checked at visit today.  Recommend a low carb diet.  Will make medication recommendations as needed.       Pacemaker    Followed by Cardiology.        Other Visit Diagnoses    Annual physical exam    -  Primary   Health Maintenance reviewed with patient during visit today.  Pneumovax 23 given.  Dexa scan ordered.  Labs ordered.    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    Relevant Orders   DG Bone Density   Need for vaccination       Relevant Orders   Pneumococcal polysaccharide vaccine 23-valent greater than or equal to 2yo subcutaneous/IM (Completed)   Essential hypertension       Relevant Medications   chlorthalidone (HYGROTON) 25 MG tablet   losartan (COZAAR) 100 MG tablet       Follow up plan: Return in about 6 months (around 06/28/2021) for HTN, HLD, DM2 FU.   LABORATORY TESTING:  - Pap smear:  Hysterectomy  IMMUNIZATIONS:   - Tdap: Tetanus vaccination status reviewed: last one is 2007. - Influenza: Up to date - Pneumovax: Administered today - Prevnar: Up to date - Zostavax vaccine: Discussed today.   SCREENING: -Mammogram: Up to date  - Colonoscopy: Up to date  - Bone Density: Ordered today    PATIENT COUNSELING:   Advised to take 1 mg of folate supplement per day if capable of pregnancy.   Sexuality: Discussed sexually transmitted diseases, partner selection, use of condoms, avoidance of unintended pregnancy  and contraceptive alternatives.   Advised to avoid cigarette smoking.  I discussed with the patient that most people either abstain from alcohol or drink within safe limits (<=14/week and <=4 drinks/occasion for males, <=7/weeks and <= 3 drinks/occasion for females) and that the risk for alcohol disorders and other health effects rises proportionally with the number of drinks per week and how often a drinker exceeds daily limits.  Discussed cessation/primary prevention of drug use and availability of treatment for abuse.   Diet: Encouraged to adjust caloric intake to maintain  or achieve ideal body weight, to reduce intake of dietary saturated fat and total fat, to limit sodium intake by avoiding high sodium foods and not adding table salt, and to maintain adequate dietary potassium and calcium preferably from fresh fruits, vegetables, and low-fat dairy products.    stressed the importance of regular exercise  Injury prevention: Discussed safety belts, safety helmets, smoke detector, smoking near bedding or upholstery.   Dental health: Discussed importance of regular tooth brushing, flossing, and  dental visits.    NEXT PREVENTATIVE PHYSICAL DUE IN 1 YEAR. Return in about 6 months (around 06/28/2021) for HTN, HLD, DM2 FU.

## 2020-12-28 NOTE — Assessment & Plan Note (Signed)
Stable.  A1c checked at visit today.  Recommend a low carb diet.  Will make medication recommendations as needed.

## 2020-12-28 NOTE — Assessment & Plan Note (Signed)
Followed by elecrophysiologist, continue per their recommendations.

## 2020-12-28 NOTE — Assessment & Plan Note (Signed)
BPs stable and under good control, continue losartan and chlorthalidone regimen.  Labs ordered today. Refills given today.

## 2020-12-28 NOTE — Assessment & Plan Note (Signed)
Discussed diet and exercise changes

## 2020-12-28 NOTE — Assessment & Plan Note (Signed)
No new neurologic issues. Continue plavix and lifestyle modifications

## 2020-12-29 ENCOUNTER — Other Ambulatory Visit: Payer: Self-pay

## 2020-12-29 ENCOUNTER — Ambulatory Visit (INDEPENDENT_AMBULATORY_CARE_PROVIDER_SITE_OTHER): Payer: Medicare Other | Admitting: Nurse Practitioner

## 2020-12-29 ENCOUNTER — Encounter: Payer: Self-pay | Admitting: Nurse Practitioner

## 2020-12-29 VITALS — BP 130/76 | HR 71 | Temp 98.3°F | Ht 62.21 in | Wt 379.8 lb

## 2020-12-29 DIAGNOSIS — Z13820 Encounter for screening for osteoporosis: Secondary | ICD-10-CM | POA: Diagnosis not present

## 2020-12-29 DIAGNOSIS — N1832 Chronic kidney disease, stage 3b: Secondary | ICD-10-CM | POA: Diagnosis not present

## 2020-12-29 DIAGNOSIS — Z8673 Personal history of transient ischemic attack (TIA), and cerebral infarction without residual deficits: Secondary | ICD-10-CM | POA: Diagnosis not present

## 2020-12-29 DIAGNOSIS — N1831 Chronic kidney disease, stage 3a: Secondary | ICD-10-CM | POA: Diagnosis not present

## 2020-12-29 DIAGNOSIS — Z0000 Encounter for general adult medical examination without abnormal findings: Secondary | ICD-10-CM

## 2020-12-29 DIAGNOSIS — I6322 Cerebral infarction due to unspecified occlusion or stenosis of basilar arteries: Secondary | ICD-10-CM

## 2020-12-29 DIAGNOSIS — D352 Benign neoplasm of pituitary gland: Secondary | ICD-10-CM | POA: Diagnosis not present

## 2020-12-29 DIAGNOSIS — Z23 Encounter for immunization: Secondary | ICD-10-CM

## 2020-12-29 DIAGNOSIS — I129 Hypertensive chronic kidney disease with stage 1 through stage 4 chronic kidney disease, or unspecified chronic kidney disease: Secondary | ICD-10-CM

## 2020-12-29 DIAGNOSIS — E7800 Pure hypercholesterolemia, unspecified: Secondary | ICD-10-CM | POA: Diagnosis not present

## 2020-12-29 DIAGNOSIS — R7303 Prediabetes: Secondary | ICD-10-CM

## 2020-12-29 DIAGNOSIS — R739 Hyperglycemia, unspecified: Secondary | ICD-10-CM | POA: Diagnosis not present

## 2020-12-29 DIAGNOSIS — I495 Sick sinus syndrome: Secondary | ICD-10-CM | POA: Diagnosis not present

## 2020-12-29 DIAGNOSIS — I10 Essential (primary) hypertension: Secondary | ICD-10-CM | POA: Diagnosis not present

## 2020-12-29 DIAGNOSIS — E785 Hyperlipidemia, unspecified: Secondary | ICD-10-CM | POA: Diagnosis not present

## 2020-12-29 DIAGNOSIS — Z950 Presence of cardiac pacemaker: Secondary | ICD-10-CM

## 2020-12-29 LAB — URINALYSIS, ROUTINE W REFLEX MICROSCOPIC
Bilirubin, UA: NEGATIVE
Glucose, UA: NEGATIVE
Leukocytes,UA: NEGATIVE
Nitrite, UA: NEGATIVE
Protein,UA: NEGATIVE
RBC, UA: NEGATIVE
Specific Gravity, UA: 1.02 (ref 1.005–1.030)
Urobilinogen, Ur: 1 mg/dL (ref 0.2–1.0)
pH, UA: 6.5 (ref 5.0–7.5)

## 2020-12-29 MED ORDER — LOSARTAN POTASSIUM 100 MG PO TABS: 100.0000 mg | ORAL_TABLET | Freq: Every day | ORAL | 1 refills | Status: DC

## 2020-12-29 MED ORDER — CLOPIDOGREL BISULFATE 75 MG PO TABS: 75.0000 mg | ORAL_TABLET | Freq: Every day | ORAL | 1 refills | Status: DC

## 2020-12-29 MED ORDER — POTASSIUM CHLORIDE CRYS ER 20 MEQ PO TBCR: 20.0000 meq | EXTENDED_RELEASE_TABLET | Freq: Two times a day (BID) | ORAL | 1 refills | Status: DC

## 2020-12-29 MED ORDER — DICLOFENAC SODIUM 1 % EX GEL
2.0000 g | Freq: Four times a day (QID) | CUTANEOUS | 1 refills | Status: DC
Start: 2020-12-29 — End: 2022-01-24

## 2020-12-29 MED ORDER — CHLORTHALIDONE 25 MG PO TABS: 25.0000 mg | ORAL_TABLET | Freq: Every day | ORAL | 1 refills | Status: DC

## 2020-12-29 NOTE — Assessment & Plan Note (Addendum)
BPs stable and under good control, continue losartan and chlorthalidone regimen.  Refills given today.

## 2020-12-29 NOTE — Assessment & Plan Note (Signed)
Lipids ordered at visit today.  Recommend a low fat diet.  Patient declines starting cholesterol medication despite elevated levels.

## 2020-12-29 NOTE — Assessment & Plan Note (Signed)
Followed by Cardiology 

## 2020-12-30 LAB — COMPREHENSIVE METABOLIC PANEL
ALT: 15 IU/L (ref 0–32)
AST: 16 IU/L (ref 0–40)
Albumin/Globulin Ratio: 1.1 — ABNORMAL LOW (ref 1.2–2.2)
Albumin: 4.1 g/dL (ref 3.8–4.8)
Alkaline Phosphatase: 82 IU/L (ref 44–121)
BUN/Creatinine Ratio: 12 (ref 12–28)
BUN: 14 mg/dL (ref 8–27)
Bilirubin Total: 0.5 mg/dL (ref 0.0–1.2)
CO2: 24 mmol/L (ref 20–29)
Calcium: 9.4 mg/dL (ref 8.7–10.3)
Chloride: 101 mmol/L (ref 96–106)
Creatinine, Ser: 1.13 mg/dL — ABNORMAL HIGH (ref 0.57–1.00)
GFR calc Af Amer: 59 mL/min/{1.73_m2} — ABNORMAL LOW (ref >59)
GFR calc non Af Amer: 51 mL/min/{1.73_m2} — ABNORMAL LOW (ref >59)
Globulin, Total: 3.6 g/dL (ref 1.5–4.5)
Glucose: 112 mg/dL — ABNORMAL HIGH (ref 65–99)
Potassium: 4.1 mmol/L (ref 3.5–5.2)
Sodium: 140 mmol/L (ref 134–144)
Total Protein: 7.7 g/dL (ref 6.0–8.5)

## 2020-12-30 LAB — CBC WITH DIFFERENTIAL/PLATELET
Basophils Absolute: 0.1 10*3/uL (ref 0.0–0.2)
Basos: 1 %
EOS (ABSOLUTE): 0.2 10*3/uL (ref 0.0–0.4)
Eos: 2 %
Hematocrit: 42.7 % (ref 34.0–46.6)
Hemoglobin: 13.8 g/dL (ref 11.1–15.9)
Immature Grans (Abs): 0 10*3/uL (ref 0.0–0.1)
Immature Granulocytes: 0 %
Lymphocytes Absolute: 1.9 10*3/uL (ref 0.7–3.1)
Lymphs: 27 %
MCH: 29.1 pg (ref 26.6–33.0)
MCHC: 32.3 g/dL (ref 31.5–35.7)
MCV: 90 fL (ref 79–97)
Monocytes Absolute: 0.7 10*3/uL (ref 0.1–0.9)
Monocytes: 10 %
Neutrophils Absolute: 4.1 10*3/uL (ref 1.4–7.0)
Neutrophils: 60 %
Platelets: 310 10*3/uL (ref 150–450)
RBC: 4.74 x10E6/uL (ref 3.77–5.28)
RDW: 12.9 % (ref 11.7–15.4)
WBC: 6.9 10*3/uL (ref 3.4–10.8)

## 2020-12-30 LAB — LIPID PANEL
Chol/HDL Ratio: 3.2 ratio (ref 0.0–4.4)
Cholesterol, Total: 164 mg/dL (ref 100–199)
HDL: 52 mg/dL (ref >39)
LDL Chol Calc (NIH): 94 mg/dL (ref 0–99)
Triglycerides: 99 mg/dL (ref 0–149)
VLDL Cholesterol Cal: 18 mg/dL (ref 5–40)

## 2020-12-30 LAB — HEMOGLOBIN A1C
Est. average glucose Bld gHb Est-mCnc: 128 mg/dL
Hgb A1c MFr Bld: 6.1 % — ABNORMAL HIGH (ref 4.8–5.6)

## 2020-12-30 LAB — TSH: TSH: 2.66 u[IU]/mL (ref 0.450–4.500)

## 2020-12-30 NOTE — Progress Notes (Signed)
Hi [name], it was a pleasure meeting you yesterday.  Overall your lab work looked good.  Your complete blood count was normal.  Your thyroid labs remain normal.  Your Creatinine bumped up a little bit from 7 months ago but it is still consistent with prior labs.  Make sure you are staying hydrated and avoiding any NSAIDS.  We will continue to monitor this at future visits.   A1c improved from 6.3 to 6.1.  Your cholesterol also improved from prior.  Keep up the good work!  If you change your mind and would like to start cholesterol medication, we can do that at anytime.  Due to your previous history of a stroke it is recommended that you start cholesterol medication to help prevent another one.  Your urine sample showed a small amount of protein in it.  This means your kidneys aren't filtering it at 100%.  We will continue to monitor this as well.    I look forward to seeing you at our next visit.

## 2021-01-05 ENCOUNTER — Telehealth: Payer: Medicare Other | Admitting: General Practice

## 2021-01-05 ENCOUNTER — Ambulatory Visit (INDEPENDENT_AMBULATORY_CARE_PROVIDER_SITE_OTHER): Payer: Medicare Other | Admitting: General Practice

## 2021-01-05 DIAGNOSIS — I10 Essential (primary) hypertension: Secondary | ICD-10-CM | POA: Diagnosis not present

## 2021-01-05 DIAGNOSIS — E7800 Pure hypercholesterolemia, unspecified: Secondary | ICD-10-CM

## 2021-01-05 NOTE — Patient Instructions (Signed)
Visit Information  PATIENT GOALS: Patient Care Plan: RNCM: HLD Management    Problem Identified: RNCM: HLD Management   Priority: Medium    Long-Range Goal: RNCM: HLD   Priority: Medium  Note:   Current Barriers:  . Poorly controlled hyperlipidemia, complicated by obesity and HTN . Current antihyperlipidemic regimen: does not take anything for cholesterol . Most recent lipid panel:     Component Value Date/Time   CHOL 164 12/29/2020 1101   CHOL 152 05/23/2017 0933   TRIG 99 12/29/2020 1101   TRIG 91 05/23/2017 0933   HDL 52 12/29/2020 1101   CHOLHDL 3.2 12/29/2020 1101   VLDL 18 05/23/2017 0933   LDLCALC 94 12/29/2020 1101 .   Marland Kitchen ASCVD risk enhancing conditions: age 65, HTN . Lacks social connections . Does not contact provider office for questions/concerns  RN Care Manager Clinical Goal(s):  Marland Kitchen Over the next 120 days, patient will work with Consulting civil engineer, providers, and care team towards execution of optimized self-health management plan . patient will verbalize understanding of plan for effective management of HLD . patient will work with [facility], [place] team, and pcp to address needs related to HLD  . patient will attend all scheduled medical appointments: 06-28-2021 at 10 am  Interventions: . Collaboration with [name], NP regarding development and update of comprehensive plan of care as evidenced by provider attestation and co-signature . Inter-disciplinary care team collaboration (see longitudinal plan of care) . Medication review performed; medication list updated in electronic medical record.  [name] care team collaboration (see longitudinal plan of care) . Referred to pharmacy team for assistance with HLD medication management . Evaluation of current treatment plan related to HLD and patient's adherence to plan as established by provider. . Advised patient to working on weight management, portion control, increasing activity . Provided education  to patient re: heart healthy diet, cutting portion sizes, exercise and activity  . Reviewed scheduled/upcoming provider appointments including: 06-28-2021 at 10 am . Discussed plans with patient for ongoing care management follow up and provided patient with direct contact information for care management team   Patient Goals/Self-Care Activities: . Over the next 120 days, patient will:   - call for medicine refill 2 or 3 days before it runs out - call if I am sick and can't take my medicine - keep a list of all the medicines I take; vitamins and herbals too - learn to read medicine labels - use a pillbox to sort medicine - use an alarm clock or phone to remind me to take my medicine - change to whole grain breads, cereal, pasta - drink 6 to 8 glasses of water each day - eat 3 to 5 servings of fruits and vegetables each day - eat 5 or 6 small meals each day - eat fish at least once per week - fill half the plate with nonstarchy vegetables - limit fast food meals to no more than 1 per week - prepare main meal at home 3 to 5 days each week - read food labels for fat, fiber, carbohydrates and portion size - be open to making changes - I can manage, know and watch for signs of a heart attack - if I have chest pain, call for help - learn about small changes that will make a big difference - learn my personal risk factors - barriers to meeting goals identified - change-talk evoked - choices provided - decision-making supported - health risks reviewed - problem-solving facilitated - questions  answered - readiness for change evaluated - reassurance provided - resources needed to meet goals identified - self-reflection promoted - self-reliance encouraged  Follow Up Plan: Telephone follow up appointment with care management team member scheduled for: 03-09-2021 at 11:45 am     Task: RNCM: HLD   Note:   Care Management Activities:    - barriers to meeting goals identified -  change-talk evoked - choices provided - decision-making supported - health risks reviewed - problem-solving facilitated - questions answered - readiness for change evaluated - reassurance provided - resources needed to meet goals identified - self-reflection promoted - self-reliance encouraged       Patient Care Plan: RNCM: Hypertension (Adult)    Problem Identified: RNCM: Hypertension (Hypertension)   Priority: Medium    Long-Range Goal: RNCM: Hypertension Monitored   Note:   Objective:  . Last practice recorded BP readings:  BP Readings from Last 3 Encounters:  12/29/20 130/76  11/16/20 135/61  06/03/20 137/84 .   Marland Kitchen Most recent eGFR/CrCl: No results found for: EGFR  No components found for: CRCL Current Barriers:  Marland Kitchen Knowledge Deficits related to basic understanding of hypertension pathophysiology and self care management . Knowledge Deficits related to understanding of medications prescribed for management of hypertension . Limited Social Support . Lacks social connections . Does not contact provider office for questions/concerns Case Manager Clinical Goal(s):  Marland Kitchen Over the next 120 days, patient will verbalize understanding of plan for hypertension management . Over the next 120 days, patient will attend all scheduled medical appointments: 06-28-2021 at 10 am . Over the next 120 days, patient will demonstrate improved adherence to prescribed treatment plan for hypertension as evidenced by taking all medications as prescribed, monitoring and recording blood pressure as directed, adhering to low sodium/DASH diet . Over the next 120 days, patient will demonstrate improved health management independence as evidenced by checking blood pressure as directed and notifying PCP if SBP>160 or DBP > 90, taking all medications as prescribe, and adhering to a low sodium diet as discussed. . Over the next 120 days, patient will verbalize basic understanding of hypertension disease process  and self health management plan as evidenced by compliance with medications, compliance with heart healthy diet, and working with the CCM team to manage health and well being Interventions:  . Collaboration with [name], NP regarding development and update of comprehensive plan of care as evidenced by provider attestation and co-signature . Inter-disciplinary care team collaboration (see longitudinal plan of care) . Evaluation of current treatment plan related to hypertension self management and patient's adherence to plan as established by provider. . Provided education to patient re: stroke prevention, s/s of heart attack and stroke, DASH diet, complications of uncontrolled blood pressure . Reviewed medications with patient and discussed importance of compliance . Discussed plans with patient for ongoing care management follow up and provided patient with direct contact information for care management team . Advised patient, providing education and rationale, to monitor blood pressure daily and record, calling PCP for findings outside established parameters.  . Reviewed scheduled/upcoming provider appointments including: 06-28-2021 at 10 am . Review of heart healthy diet. The patient prepares her own foods and does a lot of canning, preserving, and baking. Education and support given.  Patient Goals: - blood pressure trends reviewed - depression screen reviewed - home or ambulatory blood pressure monitoring encouraged Self-Care Activities: - Self administers medications as prescribed Attends all scheduled provider appointments Calls provider office for new concerns, questions, or BP outside  discussed parameters Checks BP and records as discussed Follows a low sodium diet/DASH diet Follow Up Plan: Telephone follow up appointment with care management team member scheduled for: 03-09-2021 at 11:45 am   Task: RNCM: Identify and Monitor Blood Pressure Elevation   Note:   Care Management  Activities:    - blood pressure trends reviewed - depression screen reviewed - home or ambulatory blood pressure monitoring encouraged         The patient verbalized understanding of instructions, educational materials, and care plan provided today and declined offer to receive copy of patient instructions, educational materials, and care plan.   Telephone follow up appointment with care management team member scheduled for: 03-09-2021 at 11:45 am  [name] RN, MSN, [facility] Mobile: [phone]

## 2021-01-05 NOTE — Chronic Care Management (AMB) (Signed)
Chronic Care Management   CCM RN Visit Note  01/05/2021 Name: Mary Webb MRN: [id number] DOB: [birth year]  Subjective: Mary Webb is a 65 y.o. year old female who is a primary care patient of [name], NP. The care management team was consulted for assistance with disease management and care coordination needs.    Engaged with patient by telephone for follow up visit in response to provider referral for case management and/or care coordination services.   Consent to Services:  The patient was given information about Chronic Care Management services, agreed to services, and gave verbal consent prior to initiation of services.  Please see initial visit note for detailed documentation.   Patient agreed to services and verbal consent obtained.   Assessment: Review of patient past medical history, allergies, medications, health status, including review of consultants reports, laboratory and other test data, was performed as part of comprehensive evaluation and provision of chronic care management services.   SDOH (Social Determinants of Health) assessments and interventions performed:    CCM Care Plan  Allergies  Allergen Reactions  . Elemental Sulfur Anaphylaxis  . Levaquin [Levofloxacin] Other (See Comments)    Stroke  . Tekturna [Aliskiren] Shortness Of Breath  . Accupril [Quinapril Hcl]   . Aspirin   . Diovan [Valsartan]   . Erythromycin   . Iodine   . Latex     Can't remember reaction   . Lotensin [Benazepril]   . Norvasc [Amlodipine]   . Shellfish Allergy Swelling  . Sulfa Antibiotics   . Tetanus Toxoids   . Tetracycline     Other reaction(s): Unknown  . Tetracyclines & Related   . Penicillins Rash    Outpatient Encounter Medications as of 01/05/2021  Medication Sig  . Ascorbic Acid (VITAMIN C) 1000 MG tablet Take by mouth. Every other day  . Calcium Carb-Cholecalciferol (CALCIUM 500 + D3 PO) Take by mouth daily. (Patient not taking: Reported on  12/29/2020)  . chlorthalidone (HYGROTON) 25 MG tablet Take 1 tablet (25 mg total) by mouth daily.  . Cholecalciferol (VITAMIN D3) 1000 units CAPS Take by mouth. Taking once a week  . clopidogrel (PLAVIX) 75 MG tablet Take 1 tablet (75 mg total) by mouth daily.  . Cyanocobalamin (VITAMIN B 12) 500 MCG TABS Take by mouth daily.  . diclofenac Sodium (VOLTAREN) 1 % GEL Apply 2 g topically 4 (four) times daily.  Marland Kitchen losartan (COZAAR) 100 MG tablet Take 1 tablet (100 mg total) by mouth daily.  . potassium chloride SA (KLOR-CON) 20 MEQ tablet Take 1 tablet (20 mEq total) by mouth 2 (two) times daily.  Marland Kitchen pyridOXINE (VITAMIN B-6) 25 MG tablet Take by mouth daily.    No facility-administered encounter medications on file as of 01/05/2021.    Patient Active Problem List   Diagnosis Date Noted  . Hypertension 12/29/2020  . Pacemaker 12/29/2020  . Prediabetes 12/28/2020  . Advanced care planning/counseling discussion 11/01/2017  . Sick sinus syndrome ([place]) 04/28/2015  . 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 Pituitary adenoma ([place]) 04/28/2015  . History of CVA (cerebrovascular accident) 04/28/2015  . Adult body mass index 60.0-69.9 ([place]) 04/28/2015  . Hyperlipidemia 04/28/2015  . Stroke ([place]) 04/28/2015    Conditions to be addressed/monitored:HTN and HLD  Care Plan : RNCM: HLD Management  Updates made by [name] since 01/05/2021 12:00 AM    Problem: RNCM: HLD Management   Priority: Medium    Long-Range Goal: RNCM: HLD  Priority: Medium  Note:   Current Barriers:  . Poorly controlled hyperlipidemia, complicated by obesity and HTN . Current antihyperlipidemic regimen: does not take anything for cholesterol . Most recent lipid panel:     Component Value Date/Time   CHOL 164 12/29/2020 1101   CHOL 152 05/23/2017 0933   TRIG 99 12/29/2020 1101   TRIG 91 05/23/2017 0933   HDL 52 12/29/2020 1101   CHOLHDL 3.2 12/29/2020 1101   VLDL 18 05/23/2017 0933    LDLCALC 94 12/29/2020 1101 .   Marland Kitchen ASCVD risk enhancing conditions: age 51, HTN . Lacks social connections . Does not contact provider office for questions/concerns  RN Care Manager Clinical Goal(s):  Marland Kitchen Over the next 120 days, patient will work with Consulting civil engineer, providers, and care team towards execution of optimized self-health management plan . patient will verbalize understanding of plan for effective management of HLD . patient will work with [facility], [place] team, and pcp to address needs related to HLD  . patient will attend all scheduled medical appointments: 06-28-2021 at 10 am  Interventions: . Collaboration with [name], NP regarding development and update of comprehensive plan of care as evidenced by provider attestation and co-signature . Inter-disciplinary care team collaboration (see longitudinal plan of care) . Medication review performed; medication list updated in electronic medical record.  [name] care team collaboration (see longitudinal plan of care) . Referred to pharmacy team for assistance with HLD medication management . Evaluation of current treatment plan related to HLD and patient's adherence to plan as established by provider. . Advised patient to working on weight management, portion control, increasing activity . Provided education to patient re: heart healthy diet, cutting portion sizes, exercise and activity  . Reviewed scheduled/upcoming provider appointments including: 06-28-2021 at 10 am . Discussed plans with patient for ongoing care management follow up and provided patient with direct contact information for care management team   Patient Goals/Self-Care Activities: . Over the next 120 days, patient will:   - call for medicine refill 2 or 3 days before it runs out - call if I am sick and can't take my medicine - keep a list of all the medicines I take; vitamins and herbals too - learn to read medicine labels - use a pillbox to sort  medicine - use an alarm clock or phone to remind me to take my medicine - change to whole grain breads, cereal, pasta - drink 6 to 8 glasses of water each day - eat 3 to 5 servings of fruits and vegetables each day - eat 5 or 6 small meals each day - eat fish at least once per week - fill half the plate with nonstarchy vegetables - limit fast food meals to no more than 1 per week - prepare main meal at home 3 to 5 days each week - read food labels for fat, fiber, carbohydrates and portion size - be open to making changes - I can manage, know and watch for signs of a heart attack - if I have chest pain, call for help - learn about small changes that will make a big difference - learn my personal risk factors - barriers to meeting goals identified - change-talk evoked - choices provided - decision-making supported - health risks reviewed - problem-solving facilitated - questions answered - readiness for change evaluated - reassurance provided - resources needed to meet goals identified - self-reflection promoted - self-reliance encouraged  Follow Up Plan: Telephone follow up appointment with  care management team member scheduled for: 03-09-2021 at 11:45 am     Task: RNCM: HLD   Note:   Care Management Activities:    - barriers to meeting goals identified - change-talk evoked - choices provided - decision-making supported - health risks reviewed - problem-solving facilitated - questions answered - readiness for change evaluated - reassurance provided - resources needed to meet goals identified - self-reflection promoted - self-reliance encouraged       Care Plan : RNCM: Hypertension (Adult)  Updates made by [name] since 01/05/2021 12:00 AM    Problem: RNCM: Hypertension (Hypertension)   Priority: Medium    Long-Range Goal: RNCM: Hypertension Monitored   Note:   Objective:  . Last practice recorded BP readings:  BP Readings from Last 3 Encounters:   12/29/20 130/76  11/16/20 135/61  06/03/20 137/84 .   Marland Kitchen Most recent eGFR/CrCl: No results found for: EGFR  No components found for: CRCL Current Barriers:  Marland Kitchen Knowledge Deficits related to basic understanding of hypertension pathophysiology and self care management . Knowledge Deficits related to understanding of medications prescribed for management of hypertension . Limited Social Support . Lacks social connections . Does not contact provider office for questions/concerns Case Manager Clinical Goal(s):  Marland Kitchen Over the next 120 days, patient will verbalize understanding of plan for hypertension management . Over the next 120 days, patient will attend all scheduled medical appointments: 06-28-2021 at 10 am . Over the next 120 days, patient will demonstrate improved adherence to prescribed treatment plan for hypertension as evidenced by taking all medications as prescribed, monitoring and recording blood pressure as directed, adhering to low sodium/DASH diet . Over the next 120 days, patient will demonstrate improved health management independence as evidenced by checking blood pressure as directed and notifying PCP if SBP>160 or DBP > 90, taking all medications as prescribe, and adhering to a low sodium diet as discussed. . Over the next 120 days, patient will verbalize basic understanding of hypertension disease process and self health management plan as evidenced by compliance with medications, compliance with heart healthy diet, and working with the CCM team to manage health and well being Interventions:  . Collaboration with [name], NP regarding development and update of comprehensive plan of care as evidenced by provider attestation and co-signature . Inter-disciplinary care team collaboration (see longitudinal plan of care) . Evaluation of current treatment plan related to hypertension self management and patient's adherence to plan as established by provider. . Provided education  to patient re: stroke prevention, s/s of heart attack and stroke, DASH diet, complications of uncontrolled blood pressure . Reviewed medications with patient and discussed importance of compliance . Discussed plans with patient for ongoing care management follow up and provided patient with direct contact information for care management team . Advised patient, providing education and rationale, to monitor blood pressure daily and record, calling PCP for findings outside established parameters.  . Reviewed scheduled/upcoming provider appointments including: 06-28-2021 at 10 am . Review of heart healthy diet. The patient prepares her own foods and does a lot of canning, preserving, and baking. Education and support given.  Patient Goals: - blood pressure trends reviewed - depression screen reviewed - home or ambulatory blood pressure monitoring encouraged Self-Care Activities: - Self administers medications as prescribed Attends all scheduled provider appointments Calls provider office for new concerns, questions, or BP outside discussed parameters Checks BP and records as discussed Follows a low sodium diet/DASH diet Follow Up Plan: Telephone follow up appointment  with care management team member scheduled for: 03-09-2021 at 11:45 am   Task: RNCM: Identify and Monitor Blood Pressure Elevation   Note:   Care Management Activities:    - blood pressure trends reviewed - depression screen reviewed - home or ambulatory blood pressure monitoring encouraged         Plan:Telephone follow up appointment with care management team member scheduled for:  03-09-2021 at 11:45 am  [name] RN, MSN, [facility] Mobile: [phone]

## 2021-03-09 ENCOUNTER — Ambulatory Visit (INDEPENDENT_AMBULATORY_CARE_PROVIDER_SITE_OTHER): Payer: Medicare Other | Admitting: General Practice

## 2021-03-09 ENCOUNTER — Telehealth: Payer: Self-pay | Admitting: General Practice

## 2021-03-09 DIAGNOSIS — E7800 Pure hypercholesterolemia, unspecified: Secondary | ICD-10-CM | POA: Diagnosis not present

## 2021-03-09 DIAGNOSIS — I10 Essential (primary) hypertension: Secondary | ICD-10-CM

## 2021-03-09 NOTE — Chronic Care Management (AMB) (Signed)
Chronic Care Management   CCM RN Visit Note  03/09/2021 Name: Mary Webb MRN: [id number] DOB: November 03, 1956  Subjective: Mary Webb is a 65 y.o. year old female who is a primary care patient of [name], NP. The care management team was consulted for assistance with disease management and care coordination needs.    Engaged with patient by telephone for follow up visit in response to provider referral for case management and/or care coordination services.   Consent to Services:  The patient was given information about Chronic Care Management services, agreed to services, and gave verbal consent prior to initiation of services.  Please see initial visit note for detailed documentation.   Patient agreed to services and verbal consent obtained.   Assessment: Review of patient past medical history, allergies, medications, health status, including review of consultants reports, laboratory and other test data, was performed as part of comprehensive evaluation and provision of chronic care management services.   SDOH (Social Determinants of Health) assessments and interventions performed:    CCM Care Plan  Allergies  Allergen Reactions  . Elemental Sulfur Anaphylaxis  . Levaquin [Levofloxacin] Other (See Comments)    Stroke  . Tekturna [Aliskiren] Shortness Of Breath  . Accupril [Quinapril Hcl]   . Aspirin   . Diovan [Valsartan]   . Erythromycin   . Iodine   . Latex     Can't remember reaction   . Lotensin [Benazepril]   . Norvasc [Amlodipine]   . Shellfish Allergy Swelling  . Sulfa Antibiotics   . Tetanus Toxoids   . Tetracycline     Other reaction(s): Unknown  . Tetracyclines & Related   . Penicillins Rash    Outpatient Encounter Medications as of 03/09/2021  Medication Sig  . Ascorbic Acid (VITAMIN C) 1000 MG tablet Take by mouth. Every other day  . Calcium Carb-Cholecalciferol (CALCIUM 500 + D3 PO) Take by mouth daily. (Patient not taking: Reported on  12/29/2020)  . chlorthalidone (HYGROTON) 25 MG tablet Take 1 tablet (25 mg total) by mouth daily.  . Cholecalciferol (VITAMIN D3) 1000 units CAPS Take by mouth. Taking once a week  . clopidogrel (PLAVIX) 75 MG tablet Take 1 tablet (75 mg total) by mouth daily.  . Cyanocobalamin (VITAMIN B 12) 500 MCG TABS Take by mouth daily.  . diclofenac Sodium (VOLTAREN) 1 % GEL Apply 2 g topically 4 (four) times daily.  Marland Kitchen losartan (COZAAR) 100 MG tablet Take 1 tablet (100 mg total) by mouth daily.  . potassium chloride SA (KLOR-CON) 20 MEQ tablet Take 1 tablet (20 mEq total) by mouth 2 (two) times daily.  Marland Kitchen pyridOXINE (VITAMIN B-6) 25 MG tablet Take by mouth daily.    No facility-administered encounter medications on file as of 03/09/2021.    Patient Active Problem List   Diagnosis Date Noted  . Hypertension 12/29/2020  . Pacemaker 12/29/2020  . Prediabetes 12/28/2020  . Advanced care planning/counseling discussion 11/01/2017  . Sick sinus syndrome ([place]) 04/28/2015  . 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 Pituitary adenoma ([place]) 04/28/2015  . History of CVA (cerebrovascular accident) 04/28/2015  . Adult body mass index 60.0-69.9 ([place]) 04/28/2015  . Hyperlipidemia 04/28/2015  . Stroke ([place]) 04/28/2015    Conditions to be addressed/monitored:HTN and HLD  Care Plan : RNCM: HLD Management  Updates made by [name] since 03/09/2021 12:00 AM    Problem: RNCM: HLD Management   Priority: Medium    Long-Range Goal: RNCM: HLD  Priority: Medium  Note:   Current Barriers:  . Poorly controlled hyperlipidemia, complicated by obesity and HTN . Current antihyperlipidemic regimen: does not take anything for cholesterol . Most recent lipid panel:     Component Value Date/Time   CHOL 164 12/29/2020 1101   CHOL 152 05/23/2017 0933   TRIG 99 12/29/2020 1101   TRIG 91 05/23/2017 0933   HDL 52 12/29/2020 1101   CHOLHDL 3.2 12/29/2020 1101   VLDL 18 05/23/2017 0933    LDLCALC 94 12/29/2020 1101 .   Marland Kitchen ASCVD risk enhancing conditions: age 24, HTN . Lacks social connections . Does not contact provider office for questions/concerns  RN Care Manager Clinical Goal(s):  Marland Kitchen Over the next 120 days, patient will work with Consulting civil engineer, providers, and care team towards execution of optimized self-health management plan . patient will verbalize understanding of plan for effective management of HLD . patient will work with [name], [name] team, and pcp to address needs related to HLD  . patient will attend all scheduled medical appointments: 06-28-2021 at 10 am  Interventions: . Collaboration with [name], NP regarding development and update of comprehensive plan of care as evidenced by provider attestation and co-signature . Inter-disciplinary care team collaboration (see longitudinal plan of care) . Medication review performed; medication list updated in electronic medical record.  [name] care team collaboration (see longitudinal plan of care) . Referred to pharmacy team for assistance with HLD medication management . Evaluation of current treatment plan related to HLD and patient's adherence to plan as established by provider. 03-09-2021: The patient is doing well. Denies any new concerns related to HLD. States she is doing well. No new concerns noted. . Advised patient to working on weight management, portion control, increasing activity . Provided education to patient re: heart healthy diet, cutting portion sizes, exercise and activity  . Reviewed scheduled/upcoming provider appointments including: 06-28-2021 at 10 am . Discussed plans with patient for ongoing care management follow up and provided patient with direct contact information for care management team   Patient Goals/Self-Care Activities: . Over the next 120 days, patient will:   - call for medicine refill 2 or 3 days before it runs out - call if I am sick and can't take my  medicine - keep a list of all the medicines I take; vitamins and herbals too - learn to read medicine labels - use a pillbox to sort medicine - use an alarm clock or phone to remind me to take my medicine - change to whole grain breads, cereal, pasta - drink 6 to 8 glasses of water each day - eat 3 to 5 servings of fruits and vegetables each day - eat 5 or 6 small meals each day - eat fish at least once per week - fill half the plate with nonstarchy vegetables - limit fast food meals to no more than 1 per week - prepare main meal at home 3 to 5 days each week - read food labels for fat, fiber, carbohydrates and portion size - be open to making changes - I can manage, know and watch for signs of a heart attack - if I have chest pain, call for help - learn about small changes that will make a big difference - learn my personal risk factors - barriers to meeting goals identified - change-talk evoked - choices provided - decision-making supported - health risks reviewed - problem-solving facilitated - questions answered - readiness for change evaluated - reassurance provided -  resources needed to meet goals identified - self-reflection promoted - self-reliance encouraged  Follow Up Plan: Telephone follow up appointment with care management team member scheduled for: 05-25-2021 at 11:45 am     Care Plan : RNCM: Hypertension (Adult)  Updates made by [name] since 03/09/2021 12:00 AM    Problem: RNCM: Hypertension (Hypertension)   Priority: Medium    Long-Range Goal: RNCM: Hypertension Monitored   Note:   Objective:  . Last practice recorded BP readings:  BP Readings from Last 3 Encounters:  12/29/20 130/76  11/16/20 135/61  06/03/20 137/84 .   Marland Kitchen Most recent eGFR/CrCl: No results found for: EGFR  No components found for: CRCL Current Barriers:  Marland Kitchen Knowledge Deficits related to basic understanding of hypertension pathophysiology and self care management . Knowledge  Deficits related to understanding of medications prescribed for management of hypertension . Limited Social Support . Lacks social connections . Does not contact provider office for questions/concerns Case Manager Clinical Goal(s):  Marland Kitchen Over the next 120 days, patient will verbalize understanding of plan for hypertension management . Over the next 120 days, patient will attend all scheduled medical appointments: 06-28-2021 at 10 am . Over the next 120 days, patient will demonstrate improved adherence to prescribed treatment plan for hypertension as evidenced by taking all medications as prescribed, monitoring and recording blood pressure as directed, adhering to low sodium/DASH diet . Over the next 120 days, patient will demonstrate improved health management independence as evidenced by checking blood pressure as directed and notifying PCP if SBP>160 or DBP > 90, taking all medications as prescribe, and adhering to a low sodium diet as discussed. . Over the next 120 days, patient will verbalize basic understanding of hypertension disease process and self health management plan as evidenced by compliance with medications, compliance with heart healthy diet, and working with the CCM team to manage health and well being Interventions:  . Collaboration with [name], NP regarding development and update of comprehensive plan of care as evidenced by provider attestation and co-signature . Inter-disciplinary care team collaboration (see longitudinal plan of care) . Evaluation of current treatment plan related to hypertension self management and patient's adherence to plan as established by provider. 03-09-2021: The patient is doing well. No new concerns. States that blood pressure is 130 to 140 over 70's.  . Provided education to patient re: stroke prevention, s/s of heart attack and stroke, DASH diet, complications of uncontrolled blood pressure . Reviewed medications with patient and discussed  importance of compliance . Discussed plans with patient for ongoing care management follow up and provided patient with direct contact information for care management team . Advised patient, providing education and rationale, to monitor blood pressure daily and record, calling PCP for findings outside established parameters.  . Reviewed scheduled/upcoming provider appointments including: 06-28-2021 at 10 am . Review of heart healthy diet. The patient prepares her own foods and does a lot of canning, preserving, and baking. Education and support given.  Patient Goals: - blood pressure trends reviewed - depression screen reviewed - home or ambulatory blood pressure monitoring encouraged Self-Care Activities: - Self administers medications as prescribed Attends all scheduled provider appointments Calls provider office for new concerns, questions, or BP outside discussed parameters Checks BP and records as discussed Follows a low sodium diet/DASH diet Follow Up Plan: Telephone follow up appointment with care management team member scheduled for: 05-25-2021 at 11:45 am     Plan:Telephone follow up appointment with care management team member scheduled  for:  05-25-2021 at [place] am  [place], MSN, [facility] Mobile: [phone]

## 2021-03-09 NOTE — Patient Instructions (Signed)
Visit Information  PATIENT GOALS: Patient Care Plan: RNCM: HLD Management    Problem Identified: RNCM: HLD Management   Priority: Medium    Long-Range Goal: RNCM: HLD   Priority: Medium  Note:   Current Barriers:  . Poorly controlled hyperlipidemia, complicated by obesity and HTN . Current antihyperlipidemic regimen: does not take anything for cholesterol . Most recent lipid panel:     Component Value Date/Time   CHOL 164 12/29/2020 1101   CHOL 152 05/23/2017 0933   TRIG 99 12/29/2020 1101   TRIG 91 05/23/2017 0933   HDL 52 12/29/2020 1101   CHOLHDL 3.2 12/29/2020 1101   VLDL 18 05/23/2017 0933   LDLCALC 94 12/29/2020 1101 .   Marland Kitchen ASCVD risk enhancing conditions: age 65, HTN . Lacks social connections . Does not contact provider office for questions/concerns  RN Care Manager Clinical Goal(s):  Marland Kitchen Over the next 120 days, patient will work with Consulting civil engineer, providers, and care team towards execution of optimized self-health management plan . patient will verbalize understanding of plan for effective management of HLD . patient will work with [facility], [place] team, and pcp to address needs related to HLD  . patient will attend all scheduled medical appointments: 06-28-2021 at 10 am  Interventions: . Collaboration with [name], NP regarding development and update of comprehensive plan of care as evidenced by provider attestation and co-signature . Inter-disciplinary care team collaboration (see longitudinal plan of care) . Medication review performed; medication list updated in electronic medical record.  [name] care team collaboration (see longitudinal plan of care) . Referred to pharmacy team for assistance with HLD medication management . Evaluation of current treatment plan related to HLD and patient's adherence to plan as established by provider. 03-09-2021: The patient is doing well. Denies any new concerns related to HLD. States she is doing well. No new  concerns noted. . Advised patient to working on weight management, portion control, increasing activity . Provided education to patient re: heart healthy diet, cutting portion sizes, exercise and activity  . Reviewed scheduled/upcoming provider appointments including: 06-28-2021 at 10 am . Discussed plans with patient for ongoing care management follow up and provided patient with direct contact information for care management team   Patient Goals/Self-Care Activities: . Over the next 120 days, patient will:   - call for medicine refill 2 or 3 days before it runs out - call if I am sick and can't take my medicine - keep a list of all the medicines I take; vitamins and herbals too - learn to read medicine labels - use a pillbox to sort medicine - use an alarm clock or phone to remind me to take my medicine - change to whole grain breads, cereal, pasta - drink 6 to 8 glasses of water each day - eat 3 to 5 servings of fruits and vegetables each day - eat 5 or 6 small meals each day - eat fish at least once per week - fill half the plate with nonstarchy vegetables - limit fast food meals to no more than 1 per week - prepare main meal at home 3 to 5 days each week - read food labels for fat, fiber, carbohydrates and portion size - be open to making changes - I can manage, know and watch for signs of a heart attack - if I have chest pain, call for help - learn about small changes that will make a big difference - learn my personal risk factors - barriers  to meeting goals identified - change-talk evoked - choices provided - decision-making supported - health risks reviewed - problem-solving facilitated - questions answered - readiness for change evaluated - reassurance provided - resources needed to meet goals identified - self-reflection promoted - self-reliance encouraged  Follow Up Plan: Telephone follow up appointment with care management team member scheduled for: 05-25-2021 at  11:45 am     Task: RNCM: HLD   Note:   Care Management Activities:    - barriers to meeting goals identified - change-talk evoked - choices provided - decision-making supported - health risks reviewed - problem-solving facilitated - questions answered - readiness for change evaluated - reassurance provided - resources needed to meet goals identified - self-reflection promoted - self-reliance encouraged       Patient Care Plan: RNCM: Hypertension (Adult)    Problem Identified: RNCM: Hypertension (Hypertension)   Priority: Medium    Long-Range Goal: RNCM: Hypertension Monitored   Note:   Objective:  . Last practice recorded BP readings:  BP Readings from Last 3 Encounters:  12/29/20 130/76  11/16/20 135/61  06/03/20 137/84 .   Marland Kitchen Most recent eGFR/CrCl: No results found for: EGFR  No components found for: CRCL Current Barriers:  Marland Kitchen Knowledge Deficits related to basic understanding of hypertension pathophysiology and self care management . Knowledge Deficits related to understanding of medications prescribed for management of hypertension . Limited Social Support . Lacks social connections . Does not contact provider office for questions/concerns Case Manager Clinical Goal(s):  Marland Kitchen Over the next 120 days, patient will verbalize understanding of plan for hypertension management . Over the next 120 days, patient will attend all scheduled medical appointments: 06-28-2021 at 10 am . Over the next 120 days, patient will demonstrate improved adherence to prescribed treatment plan for hypertension as evidenced by taking all medications as prescribed, monitoring and recording blood pressure as directed, adhering to low sodium/DASH diet . Over the next 120 days, patient will demonstrate improved health management independence as evidenced by checking blood pressure as directed and notifying PCP if SBP>160 or DBP > 90, taking all medications as prescribe, and adhering to a low sodium diet  as discussed. . Over the next 120 days, patient will verbalize basic understanding of hypertension disease process and self health management plan as evidenced by compliance with medications, compliance with heart healthy diet, and working with the CCM team to manage health and well being Interventions:  . Collaboration with [name], NP regarding development and update of comprehensive plan of care as evidenced by provider attestation and co-signature . Inter-disciplinary care team collaboration (see longitudinal plan of care) . Evaluation of current treatment plan related to hypertension self management and patient's adherence to plan as established by provider. 03-09-2021: The patient is doing well. No new concerns. States that blood pressure is 130 to 140 over 70's.  . Provided education to patient re: stroke prevention, s/s of heart attack and stroke, DASH diet, complications of uncontrolled blood pressure . Reviewed medications with patient and discussed importance of compliance . Discussed plans with patient for ongoing care management follow up and provided patient with direct contact information for care management team . Advised patient, providing education and rationale, to monitor blood pressure daily and record, calling PCP for findings outside established parameters.  . Reviewed scheduled/upcoming provider appointments including: 06-28-2021 at 10 am . Review of heart healthy diet. The patient prepares her own foods and does a lot of canning, preserving, and baking. Education and support given.  Patient Goals: - blood pressure trends reviewed - depression screen reviewed - home or ambulatory blood pressure monitoring encouraged Self-Care Activities: - Self administers medications as prescribed Attends all scheduled provider appointments Calls provider office for new concerns, questions, or BP outside discussed parameters Checks BP and records as discussed Follows a low sodium  diet/DASH diet Follow Up Plan: Telephone follow up appointment with care management team member scheduled for: 05-25-2021 at 11:45 am   Task: RNCM: Identify and Monitor Blood Pressure Elevation   Note:   Care Management Activities:    - blood pressure trends reviewed - depression screen reviewed - home or ambulatory blood pressure monitoring encouraged         The patient verbalized understanding of instructions, educational materials, and care plan provided today and declined offer to receive copy of patient instructions, educational materials, and care plan.   Telephone follow up appointment with care management team member scheduled for: 05-25-2021 at 1145 am  [employer], MSN, [facility] Mobile: [phone]

## 2021-03-30 DIAGNOSIS — I495 Sick sinus syndrome: Secondary | ICD-10-CM | POA: Diagnosis not present

## 2021-05-10 ENCOUNTER — Encounter: Payer: Self-pay | Admitting: Nurse Practitioner

## 2021-05-10 DIAGNOSIS — H40013 Open angle with borderline findings, low risk, bilateral: Secondary | ICD-10-CM | POA: Diagnosis not present

## 2021-05-10 DIAGNOSIS — H35033 Hypertensive retinopathy, bilateral: Secondary | ICD-10-CM | POA: Diagnosis not present

## 2021-05-10 DIAGNOSIS — D352 Benign neoplasm of pituitary gland: Secondary | ICD-10-CM | POA: Diagnosis not present

## 2021-05-10 DIAGNOSIS — H1045 Other chronic allergic conjunctivitis: Secondary | ICD-10-CM | POA: Diagnosis not present

## 2021-05-25 ENCOUNTER — Telehealth: Payer: Self-pay | Admitting: General Practice

## 2021-05-25 ENCOUNTER — Ambulatory Visit (INDEPENDENT_AMBULATORY_CARE_PROVIDER_SITE_OTHER): Payer: Medicare Other | Admitting: General Practice

## 2021-05-25 DIAGNOSIS — I10 Essential (primary) hypertension: Secondary | ICD-10-CM | POA: Diagnosis not present

## 2021-05-25 DIAGNOSIS — Z7409 Other reduced mobility: Secondary | ICD-10-CM

## 2021-05-25 DIAGNOSIS — E7800 Pure hypercholesterolemia, unspecified: Secondary | ICD-10-CM | POA: Diagnosis not present

## 2021-05-25 NOTE — Patient Instructions (Signed)
Visit Information  PATIENT GOALS:  Goals Addressed             This Visit's Progress    RNCM: Prevent Falls and Injury       Follow Up Date 07/28/2021    - add more outdoor lighting - always use handrails on the stairs - always wear low-heeled or flat shoes or slippers with nonskid soles - call the doctor if I am feeling too drowsy - install bathroom grab bars - join an exercise group in my community - keep a flashlight by the bed - keep my cell phone with me always - learn how to get back up if I fall - make an emergency alert plan in case I fall - pick up clutter from the floors - use a nonslip pad with throw rugs, or remove them completely - use a cane or walker - use a nightlight in the bathroom - wear my glasses and/or hearing aid    Why is this important?   Most falls happen when it is hard for you to walk safely. Your balance may be off because of an illness. You may have pain in your knees, hip or other joints.  You may be overly tired or taking medicines that make you sleepy. You may not be able to see or hear clearly.  Falls can lead to broken bones, bruises or other injuries.  There are things you can do to help prevent falling.     Notes: The patient loves to go outside but has a hard time due to weight. She has purchased an electronic wheelchair from a friend that was going blind and she is able to stay outside longer and be more mobile. She is pacing her activity. Review of fall prevention and safety.          The patient verbalized understanding of instructions, educational materials, and care plan provided today and declined offer to receive copy of patient instructions, educational materials, and care plan.   Telephone follow up appointment with care management team member scheduled for:07-28-2021 at 11:45 am  [place], MSN, [facility] Mobile: [phone]

## 2021-05-25 NOTE — Chronic Care Management (AMB) (Signed)
Chronic Care Management   CCM RN Visit Note  05/25/2021 Name: Mary Webb MRN: [id number] DOB: 20-Apr-1956  Subjective: Mary Webb is a 65 y.o. year old female who is a primary care patient of [name], NP. The care management team was consulted for assistance with disease management and care coordination needs.    Engaged with patient by telephone for follow up visit in response to provider referral for case management and/or care coordination services.   Consent to Services:  The patient was given information about Chronic Care Management services, agreed to services, and gave verbal consent prior to initiation of services.  Please see initial visit note for detailed documentation.   Patient agreed to services and verbal consent obtained.   Assessment: Review of patient past medical history, allergies, medications, health status, including review of consultants reports, laboratory and other test data, was performed as part of comprehensive evaluation and provision of chronic care management services.   SDOH (Social Determinants of Health) assessments and interventions performed:    CCM Care Plan  Allergies  Allergen Reactions   Elemental Sulfur Anaphylaxis   Levaquin [Levofloxacin] Other (See Comments)    Stroke   Tekturna [Aliskiren] Shortness Of Breath   Accupril [Quinapril Hcl]    Aspirin    Diovan [Valsartan]    Erythromycin    Iodine    Latex     Can't remember reaction    Lotensin [Benazepril]    Norvasc [Amlodipine]    Shellfish Allergy Swelling   Sulfa Antibiotics    Tetanus Toxoids    Tetracycline     Other reaction(s): Unknown   Tetracyclines & Related    Penicillins Rash    Outpatient Encounter Medications as of 05/25/2021  Medication Sig   Ascorbic Acid (VITAMIN C) 1000 MG tablet Take by mouth. Every other day   Calcium Carb-Cholecalciferol (CALCIUM 500 + D3 PO) Take by mouth daily. (Patient not taking: Reported on 12/29/2020)    chlorthalidone (HYGROTON) 25 MG tablet Take 1 tablet (25 mg total) by mouth daily.   Cholecalciferol (VITAMIN D3) 1000 units CAPS Take by mouth. Taking once a week   clopidogrel (PLAVIX) 75 MG tablet Take 1 tablet (75 mg total) by mouth daily.   Cyanocobalamin (VITAMIN B 12) 500 MCG TABS Take by mouth daily.   diclofenac Sodium (VOLTAREN) 1 % GEL Apply 2 g topically 4 (four) times daily.   losartan (COZAAR) 100 MG tablet Take 1 tablet (100 mg total) by mouth daily.   potassium chloride SA (KLOR-CON) 20 MEQ tablet Take 1 tablet (20 mEq total) by mouth 2 (two) times daily.   pyridOXINE (VITAMIN B-6) 25 MG tablet Take by mouth daily.    No facility-administered encounter medications on file as of 05/25/2021.    Patient Active Problem List   Diagnosis Date Noted   Hypertension 12/29/2020   Pacemaker 12/29/2020   Prediabetes 12/28/2020   Advanced care planning/counseling discussion 11/01/2017   Sick sinus syndrome ([place]) 04/28/2015   Hypertensive kidney disease with CKD stage III ([place]) 04/28/2015   Pituitary adenoma ([place]) 04/28/2015   History of CVA (cerebrovascular accident) 04/28/2015   Adult body mass index 60.0-69.9 ([place]) 04/28/2015   Hyperlipidemia 04/28/2015   Stroke ([place]) 04/28/2015    Conditions to be addressed/monitored:HTN, HLD, and mobility and preventing falls   Care Plan : RNCM: HLD Management  Updates made by [name] since 05/25/2021 12:00 AM     Problem: RNCM: HLD Management   Priority: Medium  Long-Range Goal: RNCM: HLD   Start Date: 01/05/2021  Expected End Date: 03/21/2022  This Visit's Progress: On track  Priority: Medium  Note:   Current Barriers:  Poorly controlled hyperlipidemia, complicated by obesity and HTN Current antihyperlipidemic regimen: does not take anything for cholesterol Most recent lipid panel:     Component Value Date/Time   CHOL 164 12/29/2020 1101   CHOL 152 05/23/2017 0933   TRIG 99 12/29/2020 1101   TRIG 91 05/23/2017 0933    HDL 52 12/29/2020 1101   CHOLHDL 3.2 12/29/2020 1101   VLDL 18 05/23/2017 0933   LDLCALC 94 12/29/2020 1101   ASCVD risk enhancing conditions: age 57, HTN Lacks social connections Does not contact provider office for questions/concerns  RN Care Manager Clinical Goal(s):  Over the next 120 days, patient will work with Consulting civil engineer, providers, and care team towards execution of optimized self-health management plan patient will verbalize understanding of plan for effective management of HLD patient will work with [facility], CCM team, and pcp to address needs related to HLD  patient will attend all scheduled medical appointments: 06-28-2021 at 10 am  Interventions: Collaboration with [name], NP regarding development and update of comprehensive plan of care as evidenced by provider attestation and co-signature Inter-disciplinary care team collaboration (see longitudinal plan of care) Medication review performed; medication list updated in electronic medical record.  Inter-disciplinary care team collaboration (see longitudinal plan of care) Referred to pharmacy team for assistance with HLD medication management Evaluation of current treatment plan related to HLD and patient's adherence to plan as established by provider. 05-25-2021: The patient is doing well. Denies any new concerns related to HLD. States she is doing well. No new concerns noted. Is enjoying lots of fresh fruits and vegetables from the garden. Putting up several things from the garden.  Advised patient to working on weight management, portion control, increasing activity Provided education to patient re: heart healthy diet, cutting portion sizes, exercise and activity  Reviewed scheduled/upcoming provider appointments including: 06-28-2021 at 10 am Discussed plans with patient for ongoing care management follow up and provided patient with direct contact information for care management team   Patient Goals/Self-Care  Activities: Over the next 120 days, patient will:   - call for medicine refill 2 or 3 days before it runs out - call if I am sick and can't take my medicine - keep a list of all the medicines I take; vitamins and herbals too - learn to read medicine labels - use a pillbox to sort medicine - use an alarm clock or phone to remind me to take my medicine - change to whole grain breads, cereal, pasta - drink 6 to 8 glasses of water each day - eat 3 to 5 servings of fruits and vegetables each day - eat 5 or 6 small meals each day - eat fish at least once per week - fill half the plate with nonstarchy vegetables - limit fast food meals to no more than 1 per week - prepare main meal at home 3 to 5 days each week - read food labels for fat, fiber, carbohydrates and portion size - be open to making changes - I can manage, know and watch for signs of a heart attack - if I have chest pain, call for help - learn about small changes that will make a big difference - learn my personal risk factors - barriers to meeting goals identified - change-talk evoked - choices provided - decision-making supported -  health risks reviewed - problem-solving facilitated - questions answered - readiness for change evaluated - reassurance provided - resources needed to meet goals identified - self-reflection promoted - self-reliance encouraged  Follow Up Plan: Telephone follow up appointment with care management team member scheduled for: 07-28-2021 at 11:45 am      Task: RNCM: HLD Completed 05/25/2021  Outcome: Positive  Note:   Care Management Activities:    - barriers to meeting goals identified - change-talk evoked - choices provided - decision-making supported - health risks reviewed - problem-solving facilitated - questions answered - readiness for change evaluated - reassurance provided - resources needed to meet goals identified - self-reflection promoted - self-reliance encouraged         Care Plan : RNCM: Hypertension (Adult)  Updates made by [name] since 05/25/2021 12:00 AM     Problem: RNCM: Hypertension (Hypertension)   Priority: Medium     Long-Range Goal: RNCM: Hypertension Monitored   Start Date: 01/05/2021  Expected End Date: 02/09/2022  This Visit's Progress: On track  Priority: Medium  Note:   Objective:  Last practice recorded BP readings:  BP Readings from Last 3 Encounters:  12/29/20 130/76  11/16/20 135/61  06/03/20 137/84   Most recent eGFR/CrCl: No results found for: EGFR  No components found for: CRCL Current Barriers:  Knowledge Deficits related to basic understanding of hypertension pathophysiology and self care management Knowledge Deficits related to understanding of medications prescribed for management of hypertension Unable to independently HTN Does not contact provider office for questions/concerns Case Manager Clinical Goal(s):  patient will verbalize understanding of plan for hypertension management patient will attend all scheduled medical appointments: 06-28-2021 patient will demonstrate improved adherence to prescribed treatment plan for hypertension as evidenced by taking all medications as prescribed, monitoring and recording blood pressure as directed, adhering to low sodium/DASH diet patient will demonstrate improved health management independence as evidenced by checking blood pressure as directed and notifying PCP if SBP>160 or DBP > 90, taking all medications as prescribe, and adhering to a low sodium diet as discussed. patient will verbalize basic understanding of hypertension disease process and self health management plan as evidenced by compliance with medications, compliance with heart healthy diet, and working with the CCM team to effectively manage health and well being. Interventions:  Collaboration with [name], NP regarding development and update of comprehensive plan of care as evidenced by  provider attestation and co-signature Inter-disciplinary care team collaboration (see longitudinal plan of care) Evaluation of current treatment plan related to hypertension self management and patient's adherence to plan as established by provider. Provided education to patient re: stroke prevention, s/s of heart attack and stroke, DASH diet, complications of uncontrolled blood pressure Reviewed medications with patient and discussed importance of compliance Discussed plans with patient for ongoing care management follow up and provided patient with direct contact information for care management team Advised patient, providing education and rationale, to monitor blood pressure daily and record, calling PCP for findings outside established parameters.  Reviewed scheduled/upcoming provider appointments including: 06-28-2021  Self-Care Activities: - Self administers medications as prescribed Attends all scheduled provider appointments Calls provider office for new concerns, questions, or BP outside discussed parameters Checks BP and records as discussed Follows a low sodium diet/DASH diet Patient Goals: - check blood pressure 3 times per week - choose a place to take my blood pressure (home, clinic or office, retail store) - write blood pressure results in a log or diary - agree on reward  when goals are met - agree to work together to make changes - ask questions to understand - have a family meeting to talk about healthy habits - learn about high blood pressure  Follow Up Plan: Telephone follow up appointment with care management team member scheduled for: 07-28-2021 at 11:45 am    Task: RNCM: Identify and Monitor Blood Pressure Elevation Completed 05/25/2021  Outcome: Positive  Note:   Care Management Activities:    - blood pressure trends reviewed - depression screen reviewed - home or ambulatory blood pressure monitoring encouraged         Plan:Telephone follow up appointment  with care management team member scheduled for:  07-28-2021 at 11:45 am  [place], MSN, [facility] Mobile: [phone]

## 2021-06-11 ENCOUNTER — Telehealth: Payer: Self-pay | Admitting: Family Medicine

## 2021-06-11 NOTE — Telephone Encounter (Signed)
Needs follow up appointment- appt in August looks like it was cancelled  Also notes that needs a Rx for breo- had gotten sample previously but never had it prescribed

## 2021-06-28 ENCOUNTER — Ambulatory Visit: Payer: Medicare Other | Admitting: Nurse Practitioner

## 2021-07-09 NOTE — Progress Notes (Signed)
BP 120/78   Pulse 70   Temp 98.8 F (37.1 C)   Ht 5' 2.21" (1.58 m)   Wt (!) 371 lb (168.3 kg)   BMI 67.41 kg/m    Subjective:    Patient ID: Mary Webb, female    DOB: 08/27/1956, 65 y.o.   MRN: [id number]  HPI: Mary Webb is a 65 y.o. female  Chief Complaint  Patient presents with   Diabetes   Hyperlipidemia   Hypertension   Medication Refill    Voltaren gel    HYPERTENSION / HYPERLIPIDEMIA Satisfied with current treatment? no Duration of hypertension: years BP monitoring frequency: daily BP range: 120/80 BP medication side effects: no Past BP meds: chlorthalidone and losartan (cozaar) Duration of hyperlipidemia: years Cholesterol medication side effects: no Cholesterol supplements: none Past cholesterol medications: none Medication compliance: excellent compliance Aspirin: no Recent stressors: no Recurrent headaches: no Visual changes: no Palpitations: no Dyspnea: SOB- had COVID about 1 month ago. Chest pain: no Lower extremity edema: no Dizzy/lightheaded: no     Relevant past medical, surgical, family and social history reviewed and updated as indicated. Interim medical history since our last visit reviewed. Allergies and medications reviewed and updated.  Review of Systems  Eyes:  Negative for visual disturbance.  Respiratory:  Negative for cough, chest tightness and shortness of breath.   Cardiovascular:  Negative for chest pain, palpitations and leg swelling.  Neurological:  Negative for dizziness and headaches.   Per HPI unless specifically indicated above     Objective:    BP 120/78   Pulse 70   Temp 98.8 F (37.1 C)   Ht 5' 2.21" (1.58 m)   Wt (!) 371 lb (168.3 kg)   BMI 67.41 kg/m   Wt Readings from Last 3 Encounters:  07/12/21 (!) 371 lb (168.3 kg)  12/29/20 (!) 379 lb 12.8 oz (172.3 kg)  11/16/20 (!) 375 lb (170.1 kg)    Physical Exam Vitals and nursing note reviewed.  Constitutional:      General: She is not  in acute distress.    Appearance: Normal appearance. She is obese. She is not ill-appearing, toxic-appearing or diaphoretic.  HENT:     Head: Normocephalic.     Right Ear: External ear normal.     Left Ear: External ear normal.     Nose: Nose normal.     Mouth/Throat:     Mouth: Mucous membranes are moist.     Pharynx: Oropharynx is clear.  Eyes:     General:        Right eye: No discharge.        Left eye: No discharge.     Extraocular Movements: Extraocular movements intact.     Conjunctiva/sclera: Conjunctivae normal.     Pupils: Pupils are equal, round, and reactive to light.  Cardiovascular:     Rate and Rhythm: Normal rate and regular rhythm.     Heart sounds: No murmur heard. Pulmonary:     Effort: Pulmonary effort is normal. No respiratory distress.     Breath sounds: Normal breath sounds. No wheezing or rales.  Musculoskeletal:     Cervical back: Normal range of motion and neck supple.  Skin:    General: Skin is warm and dry.     Capillary Refill: Capillary refill takes less than 2 seconds.  Neurological:     General: No focal deficit present.     Mental Status: She is alert and oriented to person, place, and time.  Mental status is at baseline.  Psychiatric:        Mood and Affect: Mood normal.        Behavior: Behavior normal.        Thought Content: Thought content normal.        Judgment: Judgment normal.    Results for orders placed or performed in visit on 12/29/20  CBC w/Diff  Result Value Ref Range   WBC 6.9 3.4 - 10.8 x10E3/uL   RBC 4.74 3.77 - 5.28 x10E6/uL   Hemoglobin 13.8 11.1 - 15.9 g/dL   Hematocrit 42.7 34.0 - 46.6 %   MCV 90 79 - 97 fL   MCH 29.1 26.6 - 33.0 pg   MCHC 32.3 31.5 - 35.7 g/dL   RDW 12.9 11.7 - 15.4 %   Platelets 310 150 - 450 x10E3/uL   Neutrophils 60 Not Estab. %   Lymphs 27 Not Estab. %   Monocytes 10 Not Estab. %   Eos 2 Not Estab. %   Basos 1 Not Estab. %   Neutrophils Absolute 4.1 1.4 - 7.0 x10E3/uL   Lymphocytes  Absolute 1.9 0.7 - 3.1 x10E3/uL   Monocytes Absolute 0.7 0.1 - 0.9 x10E3/uL   EOS (ABSOLUTE) 0.2 0.0 - 0.4 x10E3/uL   Basophils Absolute 0.1 0.0 - 0.2 x10E3/uL   Immature Granulocytes 0 Not Estab. %   Immature Grans (Abs) 0.0 0.0 - 0.1 x10E3/uL  Comp Met (CMET)  Result Value Ref Range   Glucose 112 (H) 65 - 99 mg/dL   BUN 14 8 - 27 mg/dL   Creatinine, Ser 1.13 (H) 0.57 - 1.00 mg/dL   GFR calc non Af Amer 51 (L) >59 mL/min/1.73   GFR calc Af Amer 59 (L) >59 mL/min/1.73   BUN/Creatinine Ratio 12 12 - 28   Sodium 140 134 - 144 mmol/L   Potassium 4.1 3.5 - 5.2 mmol/L   Chloride 101 96 - 106 mmol/L   CO2 24 20 - 29 mmol/L   Calcium 9.4 8.7 - 10.3 mg/dL   Total Protein 7.7 6.0 - 8.5 g/dL   Albumin 4.1 3.8 - 4.8 g/dL   Globulin, Total 3.6 1.5 - 4.5 g/dL   Albumin/Globulin Ratio 1.1 (L) 1.2 - 2.2   Bilirubin Total 0.5 0.0 - 1.2 mg/dL   Alkaline Phosphatase 82 44 - 121 IU/L   AST 16 0 - 40 IU/L   ALT 15 0 - 32 IU/L  TSH  Result Value Ref Range   TSH 2.660 0.450 - 4.500 uIU/mL  HgB A1c  Result Value Ref Range   Hgb A1c MFr Bld 6.1 (H) 4.8 - 5.6 %   Est. average glucose Bld gHb Est-mCnc 128 mg/dL  Lipid Profile  Result Value Ref Range   Cholesterol, Total 164 100 - 199 mg/dL   Triglycerides 99 0 - 149 mg/dL   HDL 52 >39 mg/dL   VLDL Cholesterol Cal 18 5 - 40 mg/dL   LDL Chol Calc (NIH) 94 0 - 99 mg/dL   Chol/HDL Ratio 3.2 0.0 - 4.4 ratio  Urinalysis, Routine w reflex microscopic  Result Value Ref Range   Specific Gravity, UA 1.020 1.005 - 1.030   pH, UA 6.5 5.0 - 7.5   Color, UA Yellow Yellow   Appearance Ur Hazy (A) Clear   Leukocytes,UA Negative Negative   Protein,UA Negative Negative/Trace   Glucose, UA Negative Negative   Ketones, UA Trace (A) Negative   RBC, UA Negative Negative   Bilirubin, UA Negative Negative  Urobilinogen, Ur 1.0 0.2 - 1.0 mg/dL   Nitrite, UA Negative Negative      Assessment & Plan:   Problem List Items Addressed This Visit        Cardiovascular and Mediastinum   Sick sinus syndrome (HCC) (Chronic)    Chronic.  Controlled.  Continue with current medication regimen.  Labs ordered today.  Return to clinic in 6 months for reevaluation.  Call sooner if concerns arise.        Hypertension    Chronic.  Controlled.  Continue with current medication regimen on Losartan 119m daily and Chlorthalidone 266mdaily.  Labs ordered today.  Return to clinic in 6 months for reevaluation.  Call sooner if concerns arise.        Relevant Orders   Comp Met (CMET)     Genitourinary   Hypertensive kidney disease with CKD stage III [place]    Chronic.  Controlled.  Continue with current medication regimen.  Labs ordered today.  Return to clinic in 6 months for reevaluation.  Call sooner if concerns arise.        Relevant Orders   Comp Met (CMET)     Other   Hyperlipidemia (Chronic)    Chronic.  Controlled.  Continue with current medication regimen.  Labs ordered today.  Return to clinic in 6 months for reevaluation.  Call sooner if concerns arise.        Relevant Orders   Lipid Profile   Prediabetes    Chronic.  Controlled.  Continue with current medication regimen of diet.  Labs ordered today.  Return to clinic in 6 months for reevaluation.  Call sooner if concerns arise.        Relevant Orders   HgB A1c     Follow up plan: Return in about 6 months (around 01/11/2022) for Physical and Fasting labs.

## 2021-07-12 ENCOUNTER — Other Ambulatory Visit: Payer: Self-pay

## 2021-07-12 ENCOUNTER — Encounter: Payer: Self-pay | Admitting: Nurse Practitioner

## 2021-07-12 ENCOUNTER — Ambulatory Visit (INDEPENDENT_AMBULATORY_CARE_PROVIDER_SITE_OTHER): Payer: Medicare Other | Admitting: Nurse Practitioner

## 2021-07-12 VITALS — BP 120/78 | HR 70 | Temp 98.8°F | Ht 62.21 in | Wt 371.0 lb

## 2021-07-12 DIAGNOSIS — N1832 Chronic kidney disease, stage 3b: Secondary | ICD-10-CM | POA: Diagnosis not present

## 2021-07-12 DIAGNOSIS — I495 Sick sinus syndrome: Secondary | ICD-10-CM | POA: Diagnosis not present

## 2021-07-12 DIAGNOSIS — R7303 Prediabetes: Secondary | ICD-10-CM

## 2021-07-12 DIAGNOSIS — I10 Essential (primary) hypertension: Secondary | ICD-10-CM | POA: Diagnosis not present

## 2021-07-12 DIAGNOSIS — I129 Hypertensive chronic kidney disease with stage 1 through stage 4 chronic kidney disease, or unspecified chronic kidney disease: Secondary | ICD-10-CM

## 2021-07-12 DIAGNOSIS — E7800 Pure hypercholesterolemia, unspecified: Secondary | ICD-10-CM

## 2021-07-12 MED ORDER — FLUTICASONE FUROATE-VILANTEROL 100-25 MCG/INH IN AEPB: 1.0000 | INHALATION_SPRAY | Freq: Every day | RESPIRATORY_TRACT | 1 refills | Status: DC

## 2021-07-12 NOTE — Assessment & Plan Note (Signed)
Chronic.  Controlled.  Continue with current medication regimen.  Labs ordered today.  Return to clinic in 6 months for reevaluation.  Call sooner if concerns arise.  ? ?

## 2021-07-12 NOTE — Assessment & Plan Note (Signed)
Chronic.  Controlled.  Continue with current medication regimen of diet.  Labs ordered today.  Return to clinic in 6 months for reevaluation.  Call sooner if concerns arise.

## 2021-07-12 NOTE — Assessment & Plan Note (Signed)
Chronic.  Controlled.  Continue with current medication regimen on Losartan '100mg'$  daily and Chlorthalidone '25mg'$  daily.  Labs ordered today.  Return to clinic in 6 months for reevaluation.  Call sooner if concerns arise.

## 2021-07-13 ENCOUNTER — Other Ambulatory Visit: Payer: Medicare Other

## 2021-07-13 LAB — LIPID PANEL
Chol/HDL Ratio: 3.2 ratio (ref 0.0–4.4)
Cholesterol, Total: 157 mg/dL (ref 100–199)
HDL: 49 mg/dL (ref >39)
LDL Chol Calc (NIH): 92 mg/dL (ref 0–99)
Triglycerides: 86 mg/dL (ref 0–149)
VLDL Cholesterol Cal: 16 mg/dL (ref 5–40)

## 2021-07-13 LAB — COMPREHENSIVE METABOLIC PANEL
ALT: 18 IU/L (ref 0–32)
AST: 17 IU/L (ref 0–40)
Albumin/Globulin Ratio: 1.1 — ABNORMAL LOW (ref 1.2–2.2)
Albumin: 4.1 g/dL (ref 3.8–4.8)
Alkaline Phosphatase: 80 IU/L (ref 44–121)
BUN/Creatinine Ratio: 11 — ABNORMAL LOW (ref 12–28)
BUN: 14 mg/dL (ref 8–27)
Bilirubin Total: 0.4 mg/dL (ref 0.0–1.2)
CO2: 26 mmol/L (ref 20–29)
Calcium: 9.7 mg/dL (ref 8.7–10.3)
Chloride: 99 mmol/L (ref 96–106)
Creatinine, Ser: 1.25 mg/dL — ABNORMAL HIGH (ref 0.57–1.00)
Globulin, Total: 3.7 g/dL (ref 1.5–4.5)
Glucose: 110 mg/dL — ABNORMAL HIGH (ref 65–99)
Potassium: 3.8 mmol/L (ref 3.5–5.2)
Sodium: 138 mmol/L (ref 134–144)
Total Protein: 7.8 g/dL (ref 6.0–8.5)
eGFR: 48 mL/min/{1.73_m2} — ABNORMAL LOW (ref >59)

## 2021-07-13 LAB — HEMOGLOBIN A1C
Est. average glucose Bld gHb Est-mCnc: 140 mg/dL
Hgb A1c MFr Bld: 6.5 % — ABNORMAL HIGH (ref 4.8–5.6)

## 2021-07-13 MED ORDER — EMPAGLIFLOZIN 10 MG PO TABS
10.0000 mg | ORAL_TABLET | Freq: Every day | ORAL | 1 refills | Status: DC
Start: 2021-07-13 — End: 2022-01-06

## 2021-07-13 NOTE — Progress Notes (Signed)
Orders placed.

## 2021-07-13 NOTE — Progress Notes (Signed)
Please let patient know that over the last year her Kidney function has declined.  Her A1c increased to the diabetic range at 6.5.  I would like to add jardance to help with the diabetes and see if it also helps her kidney function.  I would like her to come back and leave a urine sample to further evaluate her kidney function.  If patient agrees please let me know and I will place the order for the medication and lab work.

## 2021-07-13 NOTE — Addendum Note (Signed)
Addended by: [name] on: 07/13/2021 10:12 AM   Modules accepted: Orders

## 2021-07-14 ENCOUNTER — Other Ambulatory Visit: Payer: Medicare Other

## 2021-07-14 ENCOUNTER — Other Ambulatory Visit: Payer: Self-pay

## 2021-07-14 DIAGNOSIS — I129 Hypertensive chronic kidney disease with stage 1 through stage 4 chronic kidney disease, or unspecified chronic kidney disease: Secondary | ICD-10-CM

## 2021-07-14 DIAGNOSIS — N1832 Chronic kidney disease, stage 3b: Secondary | ICD-10-CM | POA: Diagnosis not present

## 2021-07-14 LAB — MICROALBUMIN, URINE WAIVED
Creatinine, Urine Waived: 300 mg/dL (ref 10–300)
Microalb, Ur Waived: 80 mg/L — ABNORMAL HIGH (ref 0–19)
Microalb/Creat Ratio: <30 mg/g (ref <30)

## 2021-07-15 ENCOUNTER — Telehealth: Payer: Self-pay | Admitting: Nurse Practitioner

## 2021-07-15 MED ORDER — ACCU-CHEK AVIVA PLUS W/DEVICE KIT: PACK | 1 refills | Status: AC

## 2021-07-15 MED ORDER — ACCU-CHEK AVIVA PLUS VI STRP: ORAL_STRIP | 12 refills | Status: DC

## 2021-07-15 MED ORDER — ACCU-CHEK SOFTCLIX LANCETS MISC: 12 refills | Status: DC

## 2021-07-15 NOTE — Telephone Encounter (Signed)
Called and left a message for patient to return my call to let me know what type of meter she wants and how often she is checking her sugar.

## 2021-07-15 NOTE — Telephone Encounter (Signed)
Patient called back stated that she want the same thing that her husband gets since his stuff is free and they have the same insurance Looks like he has [place] [employer]. Please advise

## 2021-07-15 NOTE — Telephone Encounter (Signed)
Patient states she was dx with diabetes and would like PCP to prescribe a meter and test trips.    Patient states her spouse is a patient of Jolene Cannady and would like the to same meter and test strips her spouse has because insurance covers both.   Patient would like both scripts sent in to   [facility], [place] Phone:  [phone]  Fax:  [phone]

## 2021-07-20 ENCOUNTER — Other Ambulatory Visit: Payer: Self-pay | Admitting: Nurse Practitioner

## 2021-07-20 DIAGNOSIS — N1832 Chronic kidney disease, stage 3b: Secondary | ICD-10-CM

## 2021-07-20 DIAGNOSIS — I129 Hypertensive chronic kidney disease with stage 1 through stage 4 chronic kidney disease, or unspecified chronic kidney disease: Secondary | ICD-10-CM

## 2021-07-20 DIAGNOSIS — I6322 Cerebral infarction due to unspecified occlusion or stenosis of basilar arteries: Secondary | ICD-10-CM

## 2021-07-20 DIAGNOSIS — I495 Sick sinus syndrome: Secondary | ICD-10-CM

## 2021-07-20 DIAGNOSIS — I10 Essential (primary) hypertension: Secondary | ICD-10-CM

## 2021-07-20 NOTE — Telephone Encounter (Signed)
Requested Prescriptions  Pending Prescriptions Disp Refills  . chlorthalidone (HYGROTON) 25 MG tablet [Pharmacy Med Name: CHLORTHALIDONE 25 MG TABLET] 90 tablet 1    Sig: Take 1 tablet (25 mg total) by mouth daily.     Cardiovascular: Diuretics - Thiazide Failed - 07/20/2021 10:37 AM      Failed - Cr in normal range and within 360 days    Creatinine  Date Value Ref Range Status  07/12/2014 1.37 (H) 0.60 - 1.30 mg/dL Final   Creatinine, Ser  Date Value Ref Range Status  07/12/2021 1.25 (H) 0.57 - 1.00 mg/dL Final         Passed - Ca in normal range and within 360 days    Calcium  Date Value Ref Range Status  07/12/2021 9.7 8.7 - 10.3 mg/dL Final   Calcium, Total  Date Value Ref Range Status  07/12/2014 9.0 8.5 - 10.1 mg/dL Final         Passed - K in normal range and within 360 days    Potassium  Date Value Ref Range Status  07/12/2021 3.8 3.5 - 5.2 mmol/L Final  07/12/2014 3.4 (L) 3.5 - 5.1 mmol/L Final         Passed - Na in normal range and within 360 days    Sodium  Date Value Ref Range Status  07/12/2021 138 134 - 144 mmol/L Final  07/12/2014 137 136 - 145 mmol/L Final         Passed - Last BP in normal range    BP Readings from Last 1 Encounters:  07/12/21 120/78         Passed - Valid encounter within last 6 months    Recent Outpatient Visits          1 week ago Hypertensive kidney disease with stage 3b chronic kidney disease ([place])   [place], Karen, NP   6 months ago Annual physical exam   [facility] [name], NP   1 year ago Hypertensive kidney disease with stage 3 chronic kidney disease, unspecified whether stage 3a or 3b CKD   [place], Rachel Elizabeth, PA-C   1 year ago Hypertensive kidney disease with stage 3 chronic kidney disease, unspecified whether stage 3a or 3b CKD   [facility] Volney American, PA-C   1 year ago Erroneous encounter - disregard    [facility] Volney American, [place]      Future Appointments            In 5 months [name], NP [facility], [place]   In 7 months  [employer], [place]           . clopidogrel (PLAVIX) 75 MG tablet [Pharmacy Med Name: CLOPIDOGREL 75 MG TABLET] 90 tablet 1    Sig: Take 1 tablet (75 mg total) by mouth daily.     Hematology: Antiplatelets - clopidogrel Failed - 07/20/2021 10:37 AM      Failed - Evaluate AST, ALT within 2 months of therapy initiation.      Failed - HCT in normal range and within 180 days    Hematocrit  Date Value Ref Range Status  12/29/2020 42.7 34.0 - 46.6 % Final         Failed - HGB in normal range and within 180 days    Hemoglobin  Date Value Ref Range Status  12/29/2020 13.8 11.1 - 15.9 g/dL Final  Failed - PLT in normal range and within 180 days    Platelets  Date Value Ref Range Status  12/29/2020 310 150 - 450 x10E3/uL Final         Passed - ALT in normal range and within 360 days    ALT  Date Value Ref Range Status  07/12/2021 18 0 - 32 IU/L Final   SGPT (ALT)  Date Value Ref Range Status  07/12/2014 38 U/L Final    Comment:    14-63 NOTE: New Reference Range 06/03/14    ALT (SGPT) Piccolo, Waived  Date Value Ref Range Status  05/23/2017 23 10 - 47 U/L Final         Passed - AST in normal range and within 360 days    AST  Date Value Ref Range Status  07/12/2021 17 0 - 40 IU/L Final   SGOT(AST)  Date Value Ref Range Status  07/12/2014 28 15 - 37 Unit/L Final   AST (SGOT) Piccolo, Waived  Date Value Ref Range Status  05/23/2017 45 (H) 11 - 38 U/L Final         Passed - Valid encounter within last 6 months    Recent Outpatient Visits          1 week ago Hypertensive kidney disease with stage 3b chronic kidney disease ([place])   [place], Karen, NP   6 months ago Annual physical exam   [facility] [name], NP   1 year ago  Hypertensive kidney disease with stage 3 chronic kidney disease, unspecified whether stage 3a or 3b CKD   [place], Rachel Elizabeth, PA-C   1 year ago Hypertensive kidney disease with stage 3 chronic kidney disease, unspecified whether stage 3a or 3b CKD   [facility] Volney American, [place]   1 year ago Erroneous encounter - disregard   [facility] Volney American, [place]      Future Appointments            In 5 months [name], NP [facility], [place]   In 7 months  [employer], [place]

## 2021-07-27 ENCOUNTER — Inpatient Hospital Stay: Admission: RE | Admit: 2021-07-27 | Payer: Medicare Other | Source: Ambulatory Visit

## 2021-07-28 ENCOUNTER — Telehealth: Payer: Self-pay

## 2021-07-28 NOTE — Telephone Encounter (Signed)
  Care Management   Follow Up Note   07/28/2021 Name: Mary Webb MRN: [id number] DOB: [birth year]   Referred by: [name], NP Reason for referral : Chronic Care Management (RNCM: Follow up for Chronic Disease Management and Care Coordination Needs)   An unsuccessful telephone outreach was attempted today. The patient was referred to the case management team for assistance with care management and care coordination.   Follow Up Plan: A HIPPA compliant phone message was left for the patient providing contact information and requesting a return call.   [name] RN, MSN, [facility] Mobile: [phone]

## 2021-08-11 ENCOUNTER — Telehealth: Payer: Self-pay

## 2021-08-11 NOTE — Chronic Care Management (AMB) (Signed)
  Care Management   Note  08/11/2021 Name: Mary Webb MRN: [id number] DOB: 03/07/1956  Mary Webb is a 65 y.o. year old female who is a primary care patient of [name], NP and is actively engaged with the care management team. I reached out to [name] by phone today to assist with re-scheduling a follow up visit with the RN Case Manager  Follow up plan: Unsuccessful telephone outreach attempt made. A HIPAA compliant phone message was left for the patient providing contact information and requesting a return call.  The care management team will reach out to the patient again over the next 7 days.  If patient returns call to provider office, please advise to call [facility]  at [place], [address], [place], [place] [address] Direct Dial: [phone] [email or web address]

## 2021-08-18 ENCOUNTER — Telehealth: Payer: Medicare Other

## 2021-09-08 ENCOUNTER — Ambulatory Visit (INDEPENDENT_AMBULATORY_CARE_PROVIDER_SITE_OTHER): Payer: Medicare Other

## 2021-09-08 ENCOUNTER — Telehealth: Payer: Medicare Other | Admitting: General Practice

## 2021-09-08 DIAGNOSIS — R7303 Prediabetes: Secondary | ICD-10-CM

## 2021-09-08 DIAGNOSIS — I10 Essential (primary) hypertension: Secondary | ICD-10-CM

## 2021-09-08 DIAGNOSIS — N1832 Chronic kidney disease, stage 3b: Secondary | ICD-10-CM

## 2021-09-08 DIAGNOSIS — E7800 Pure hypercholesterolemia, unspecified: Secondary | ICD-10-CM

## 2021-09-08 DIAGNOSIS — R7309 Other abnormal glucose: Secondary | ICD-10-CM

## 2021-09-08 NOTE — Chronic Care Management (AMB) (Signed)
Chronic Care Management   CCM RN Visit Note  09/08/2021 Name: Mary Webb MRN: [id number] DOB: [birth year]  Subjective: Mary Webb is a 65 y.o. year old female who is a primary care patient of [name], NP. The care management team was consulted for assistance with disease management and care coordination needs.    Engaged with patient by telephone for follow up visit in response to provider referral for case management and/or care coordination services.   Consent to Services:  The patient was given information about Chronic Care Management services, agreed to services, and gave verbal consent prior to initiation of services.  Please see initial visit note for detailed documentation.   Patient agreed to services and verbal consent obtained.   Assessment: Review of patient past medical history, allergies, medications, health status, including review of consultants reports, laboratory and other test data, was performed as part of comprehensive evaluation and provision of chronic care management services.   SDOH (Social Determinants of Health) assessments and interventions performed:  SDOH Interventions    Flowsheet Row Most Recent Value  SDOH Interventions   Food Insecurity Interventions Intervention Not Indicated  Financial Strain Interventions Intervention Not Indicated  Housing Interventions Intervention Not Indicated  Intimate Partner Violence Interventions Intervention Not Indicated  Physical Activity Interventions Intervention Not Indicated  Stress Interventions Intervention Not Indicated  Social Connections Interventions Intervention Not Indicated  Transportation Interventions Intervention Not Indicated        CCM Care Plan  Allergies  Allergen Reactions   Elemental Sulfur Anaphylaxis   Levaquin [Levofloxacin] Other (See Comments)    Stroke   Tekturna [Aliskiren] Shortness Of Breath   Accupril [Quinapril Hcl]    Aspirin    Diovan [Valsartan]     Erythromycin    Iodine    Latex     Can't remember reaction    Lotensin [Benazepril]    Norvasc [Amlodipine]    Shellfish Allergy Swelling   Sulfa Antibiotics    Tetanus Toxoids    Tetracycline     Other reaction(s): Unknown   Tetracyclines & Related    Penicillins Rash    Outpatient Encounter Medications as of 09/08/2021  Medication Sig   Accu-Chek Softclix Lancets lancets Use to check blood sugar 3 times a day and document for visits. Goal is <130 fasting and <180 two hours after meal.,   Ascorbic Acid (VITAMIN C) 1000 MG tablet Take by mouth. Every other day   Blood Glucose Monitoring Suppl (ACCU-CHEK AVIVA PLUS) w/Device KIT Use to check blood sugar 3 times a day and document for visits.  Goal is <130 fasting and <180 two hours after meal.   chlorthalidone (HYGROTON) 25 MG tablet Take 1 tablet (25 mg total) by mouth daily.   Cholecalciferol (VITAMIN D3) 1000 units CAPS Take by mouth. Taking once a week   clopidogrel (PLAVIX) 75 MG tablet Take 1 tablet (75 mg total) by mouth daily.   Cyanocobalamin (VITAMIN B 12) 500 MCG TABS Take by mouth daily.   diclofenac Sodium (VOLTAREN) 1 % GEL Apply 2 g topically 4 (four) times daily.   empagliflozin (JARDIANCE) 10 MG TABS tablet Take 1 tablet (10 mg total) by mouth daily before breakfast.   fluticasone furoate-vilanterol (BREO ELLIPTA) 100-25 MCG/INH AEPB Inhale 1 puff into the lungs daily.   glucose blood (ACCU-CHEK AVIVA PLUS) test strip Use to check blood sugar 3 times a day and document for visits. Goal is <130 fasting and <180 two hours after meal.,   losartan (COZAAR)  100 MG tablet Take 1 tablet (100 mg total) by mouth daily.   potassium chloride SA (KLOR-CON) 20 MEQ tablet Take 1 tablet (20 mEq total) by mouth 2 (two) times daily.   pyridOXINE (VITAMIN B-6) 25 MG tablet Take by mouth daily.    No facility-administered encounter medications on file as of 09/08/2021.    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/28/2015   Pituitary adenoma ([place]) 04/28/2015   History of CVA (cerebrovascular accident) 04/28/2015   Adult body mass index 60.0-69.9 ([place]) 04/28/2015   Hyperlipidemia 04/28/2015   Stroke ([place]) 04/28/2015    Conditions to be addressed/monitored:HTN, HLD, DMII, and CKD Stage 3  Care Plan : RNCM: HLD Management  Updates made by [name], RN since 09/08/2021 12:00 AM  Completed 09/08/2021   Problem: RNCM: HLD Management Resolved 09/08/2021  Priority: Medium     Long-Range Goal: RNCM: HLD Completed 09/08/2021  Start Date: 01/05/2021  Expected End Date: 03/21/2022  Recent Progress: On track  Priority: Medium  Note:   Current Barriers: Resolving, duplicate goal  Poorly controlled hyperlipidemia, complicated by obesity and HTN Current antihyperlipidemic regimen: does not take anything for cholesterol Most recent lipid panel:     Component Value Date/Time   CHOL 164 12/29/2020 1101   CHOL 152 05/23/2017 0933   TRIG 99 12/29/2020 1101   TRIG 91 05/23/2017 0933   HDL 52 12/29/2020 1101   CHOLHDL 3.2 12/29/2020 1101   VLDL 18 05/23/2017 0933   LDLCALC 94 12/29/2020 1101   ASCVD risk enhancing conditions: age 67, HTN Lacks social connections Does not contact provider office for questions/concerns  RN Care Manager Clinical Goal(s):  Over the next 120 days, patient will work with Consulting civil engineer, providers, and care team towards execution of optimized self-health management plan patient will verbalize understanding of plan for effective management of HLD patient will work with [facility], CCM team, and pcp to address needs related to HLD  patient will attend all scheduled medical appointments: 06-28-2021 at 10 am  Interventions: Collaboration with  [name], NP regarding development and update of comprehensive plan of care as evidenced by provider attestation and co-signature Inter-disciplinary care team collaboration (see longitudinal plan of care) Medication review performed; medication list updated in electronic medical record.  Inter-disciplinary care team collaboration (see longitudinal plan of care) Referred to pharmacy team for assistance with HLD medication management Evaluation of current treatment plan related to HLD and patient's adherence to plan as established by provider. 05-25-2021: The patient is doing well. Denies any new concerns related to HLD. States she is doing well. No new concerns noted. Is enjoying lots of fresh fruits and vegetables from the garden. Putting up several things from the garden.  Advised patient to working on weight management, portion control, increasing activity Provided education to patient re: heart healthy diet, cutting portion sizes, exercise and activity  Reviewed scheduled/upcoming provider appointments including: 06-28-2021 at 10 am Discussed plans with patient for ongoing care management follow up and provided patient with direct contact information for care management team   Patient Goals/Self-Care Activities: Over the next 120 days, patient will:   - call for medicine refill 2 or 3 days before it runs out - call if I am sick and can't take my medicine -  keep a list of all the medicines I take; vitamins and herbals too - learn to read medicine labels - use a pillbox to sort medicine - use an alarm clock or phone to remind me to take my medicine - change to whole grain breads, cereal, pasta - drink 6 to 8 glasses of water each day - eat 3 to 5 servings of fruits and vegetables each day - eat 5 or 6 small meals each day - eat fish at least once per week - fill half the plate with nonstarchy vegetables - limit fast food meals to no more than 1 per week - prepare main meal at home  3 to 5 days each week - read food labels for fat, fiber, carbohydrates and portion size - be open to making changes - I can manage, know and watch for signs of a heart attack - if I have chest pain, call for help - learn about small changes that will make a big difference - learn my personal risk factors - barriers to meeting goals identified - change-talk evoked - choices provided - decision-making supported - health risks reviewed - problem-solving facilitated - questions answered - readiness for change evaluated - reassurance provided - resources needed to meet goals identified - self-reflection promoted - self-reliance encouraged  Follow Up Plan: Telephone follow up appointment with care management team member scheduled for: 07-28-2021 at 11:45 am      Care Plan : RNCM: Hypertension (Adult)  Updates made by [name], RN since 09/08/2021 12:00 AM  Completed 09/08/2021   Problem: RNCM: Hypertension (Hypertension) Resolved 09/08/2021  Priority: Medium     Long-Range Goal: RNCM: Hypertension Monitored Completed 09/08/2021  Start Date: 01/05/2021  Expected End Date: 02/09/2022  Recent Progress: On track  Priority: Medium  Note:   Objective: Resolving, duplicate goal  Last practice recorded BP readings:  BP Readings from Last 3 Encounters:  12/29/20 130/76  11/16/20 135/61  06/03/20 137/84   Most recent eGFR/CrCl: No results found for: EGFR  No components found for: CRCL Current Barriers:  Knowledge Deficits related to basic understanding of hypertension pathophysiology and self care management Knowledge Deficits related to understanding of medications prescribed for management of hypertension Unable to independently HTN Does not contact provider office for questions/concerns Case Manager Clinical Goal(s):  patient will verbalize understanding of plan for hypertension management patient will attend all scheduled medical appointments: 06-28-2021 patient will  demonstrate improved adherence to prescribed treatment plan for hypertension as evidenced by taking all medications as prescribed, monitoring and recording blood pressure as directed, adhering to low sodium/DASH diet patient will demonstrate improved health management independence as evidenced by checking blood pressure as directed and notifying PCP if SBP>160 or DBP > 90, taking all medications as prescribe, and adhering to a low sodium diet as discussed. patient will verbalize basic understanding of hypertension disease process and self health management plan as evidenced by compliance with medications, compliance with heart healthy diet, and working with the CCM team to effectively manage health and well being. Interventions:  Collaboration with [name], NP regarding development and update of comprehensive plan of care as evidenced by provider attestation and co-signature Inter-disciplinary care team collaboration (see longitudinal plan of care) Evaluation of current treatment plan related to hypertension self management and patient's adherence to plan as established by provider. Provided education to patient re: stroke prevention, s/s of heart attack and stroke, DASH diet, complications of uncontrolled blood pressure Reviewed medications with patient and discussed importance of  compliance Discussed plans with patient for ongoing care management follow up and provided patient with direct contact information for care management team Advised patient, providing education and rationale, to monitor blood pressure daily and record, calling PCP for findings outside established parameters.  Reviewed scheduled/upcoming provider appointments including: 06-28-2021  Self-Care Activities: - Self administers medications as prescribed Attends all scheduled provider appointments Calls provider office for new concerns, questions, or BP outside discussed parameters Checks BP and records as  discussed Follows a low sodium diet/DASH diet Patient Goals: - check blood pressure 3 times per week - choose a place to take my blood pressure (home, clinic or office, retail store) - write blood pressure results in a log or diary - agree on reward when goals are met - agree to work together to make changes - ask questions to understand - have a family meeting to talk about healthy habits - learn about high blood pressure  Follow Up Plan: Telephone follow up appointment with care management team member scheduled for: 07-28-2021 at 11:45 am    Care Plan : RNCM; General Plan of Care (Adult) for Chronic Disease Management and Care Coordination Needs  Updates made by [name], RN since 09/08/2021 12:00 AM     Problem: RNCM: Development of Plan of Care for Chronic Disease Management and Care coordination Needs( HTN, HLD, CKD3, Pre-DM2)   Priority: High     Long-Range Goal: RNCM: Effective management  of Plan of Care for Chronic Disease Management and Care coordination Needs( HTN, HLD, CKD3, Pre-DM2)   Start Date: 09/08/2021  Expected End Date: 09/08/2022  Priority: High  Note:   Current Barriers:  Knowledge Deficits related to plan of care for management of HTN, HLD, DMII, and CKD Stage 3  Chronic Disease Management support and education needs related to HTN, HLD, DMII, and CKD Stage 3  RNCM Clinical Goal(s):  Patient will verbalize understanding of plan for management of HTN, HLD, DMII, and CKD Stage 3  verbalize basic understanding of HTN, HLD, DMII, and CKD Stage 3 disease process and self health management plan   take all medications exactly as prescribed and will call provider for medication related questions demonstrate understanding of rationale for each prescribed medication   attend all scheduled medical appointments: 01-11-2022 at 1050 am demonstrate improved and ongoing adherence to prescribed treatment plan for HTN, HLD, DMII, and CKD Stage 3 as evidenced by daily  monitoring and recording of CBG  adherence to ADA/ carb modified diet adherence to prescribed medication regimen contacting provider for new or worsened symptoms or questions   demonstrate improved and ongoing health management independence   demonstrate a decrease in HTN, HLD, DMII, and CKD Stage 3 exacerbations   demonstrate ongoing self health care management ability effective management of chronic conditions  through collaboration with RN Care manager, provider, and care team.   Interventions: 1:1 collaboration with primary care provider regarding development and update of comprehensive plan of care as evidenced by provider attestation and co-signature Inter-disciplinary care team collaboration (see longitudinal plan of care) Evaluation of current treatment plan related to  self management and patient's adherence to plan as established by provider   SDOH Barriers (Status: Goal on track: YES.) Long Term Goal (>30 days) Patient interviewed and SDOH assessment performed        SDOH Interventions    Flowsheet Row Most Recent Value  SDOH Interventions   Food Insecurity Interventions Intervention Not Indicated  Financial Strain Interventions Intervention Not Indicated  Housing Interventions  Intervention Not Indicated  Intimate Partner Violence Interventions Intervention Not Indicated  Physical Activity Interventions Intervention Not Indicated  Stress Interventions Intervention Not Indicated  Social Connections Interventions Intervention Not Indicated  Transportation Interventions Intervention Not Indicated     Patient interviewed and appropriate assessments performed Provided patient with information about resources available for the patient in [facility] and care guides available to address needs as they arise related to SDOH Discussed plans with patient for ongoing care management follow up and provided patient with direct contact information for care management team Advised  patient to call the office for changes in SDOH, questions or concerns    Chronic Kidney Disease (Status: Goal on track: YES.)  Last practice recorded BP readings:  BP Readings from Last 3 Encounters:  07/12/21 120/78  12/29/20 130/76  11/16/20 135/61  Most recent eGFR/CrCl:  Lab Results  Component Value Date   EGFR 48 (L) 07/12/2021    No components found for: CRCL  Assessed the patient     understanding of chronic kidney disease    Evaluation of current treatment plan related to chronic kidney disease self management and patient's adherence to plan as established by provider. 09-08-2021: The patient has started taking Jardiance, education and support given. The patient states she has been peeing a lot but the pharmacist told her that should subside. The patient states the cost is a little high but she is managing. Knows to call the CCM team for changes or needs related to CKD and effective management.     Provided education to patient re: stroke prevention, s/s of heart attack and stroke    Reviewed prescribed diet heart healthy/ADA diet Reviewed medications with patient and discussed importance of compliance. 09-08-2021: Has been taking Jardiance since the end of August.    Advised patient, providing education and rationale, to monitor blood pressure daily and record, calling PCP for findings outside established parameters    Discussed complications of poorly controlled blood pressure such as heart disease, stroke, circulatory complications, vision complications, kidney impairment, sexual dysfunction    Reviewed scheduled/upcoming provider appointments including: 01-11-2022 at 67 am    Advised patient to discuss treatment options and recommendations for controlling progression of CKD with provider    Discussed plans with patient for ongoing care management follow up and provided patient with direct contact information for care management team    Screening for signs and symptoms of  depression related to chronic disease state      Discussed the impact of chronic kidney disease on daily life and mental health and acknowledged and normalized feelings of disempowerment, fear, and frustration    Assessed social determinant of health barriers    Provided education on kidney disease progression    Engage patient in early, proactive and ongoing discussion about goals of care and what matters most to them    Support coping and stress management by recognizing current strategies and assist in developing new strategies such as mindfulness, journaling, relaxation techniques, problem-solving      Diabetes:  (Status: Goal on track: YES.) Lab Results  Component Value Date   HGBA1C 6.5 (H) 07/12/2021  Assessed patient's understanding of A1c goal: <7% Provided education to patient about basic DM disease process; Reviewed medications with patient and discussed importance of medication adherence. 09-08-2021: The patient is taking Jardiance. Is "peeing a lot", but denies any acute distress. States cost is a little high but she is managing. ;        Reviewed prescribed diet  with patient heart healthy/ADA diet ; Counseled on importance of regular laboratory monitoring as prescribed;        Discussed plans with patient for ongoing care management follow up and provided patient with direct contact information for care management team;      Provided patient with written educational materials related to hypo and hyperglycemia and importance of correct treatment. 09-08-2021: Review of sx and sx. The patient has been to an education class with her husband and is aware of factors that cause blood sugars to change. States her blood sugars have been in 120's.       Reviewed scheduled/upcoming provider appointments including: 01-11-2022 at 1050 am;         Advised patient, providing education and rationale, to check cbg as directed and record.  09-08-2021: Review of blood sugars fasting <130 and post  prandial of <180. States her range has been in 120's. She is mindful of dietary restrictions as her husband is diabetic also. Will continue to monitor.       call provider for findings outside established parameters;       Review of patient status, including review of consultants reports, relevant laboratory and other test results, and medications completed;        Hyperlipidemia:  (Status: Goal on track: YES.) Lab Results  Component Value Date   CHOL 157 07/12/2021   HDL 49 07/12/2021   LDLCALC 92 07/12/2021   TRIG 86 07/12/2021   CHOLHDL 3.2 07/12/2021     Medication review performed; medication list updated in electronic medical record.  Provider established cholesterol goals reviewed; Counseled on importance of regular laboratory monitoring as prescribed; Provided HLD educational materials; Reviewed role and benefits of statin for ASCVD risk reduction; Discussed strategies to manage statin-induced myalgias; Reviewed importance of limiting foods high in cholesterol;  Hypertension: (Status: Goal on track: YES.) Last practice recorded BP readings:  BP Readings from Last 3 Encounters:  07/12/21 120/78  12/29/20 130/76  11/16/20 135/61  Most recent eGFR/CrCl:  Lab Results  Component Value Date   EGFR 48 (L) 07/12/2021    No components found for: CRCL  Evaluation of current treatment plan related to hypertension self management and patient's adherence to plan as established by provider;   Provided education to patient re: stroke prevention, s/s of heart attack and stroke; Reviewed prescribed diet heart healthy/ADA Reviewed medications with patient and discussed importance of compliance;  Discussed plans with patient for ongoing care management follow up and provided patient with direct contact information for care management team; Advised patient, providing education and rationale, to monitor blood pressure daily and record, calling PCP for findings outside established  parameters;  Reviewed scheduled/upcoming provider appointments including:  Provided education on prescribed diet Heart healthy/ADA diet ;  Discussed complications of poorly controlled blood pressure such as heart disease, stroke, circulatory complications, vision complications, kidney impairment, sexual dysfunction;  Screening for signs and symptoms of depression related to chronic disease state;  Assessed social determinant of health barriers;   Patient Goals/Self-Care Activities: Patient will self administer medications as prescribed as evidenced by self report/primary caregiver report  Patient will attend all scheduled provider appointments as evidenced by clinician review of documented attendance to scheduled appointments and patient/caregiver report Patient will call pharmacy for medication refills as evidenced by patient report and review of pharmacy fill history as appropriate Patient will attend church or other social activities as evidenced by patient report Patient will continue to perform ADL's independently as evidenced by patient/caregiver report  Patient will continue to perform IADL's independently as evidenced by patient/caregiver report Patient will call provider office for new concerns or questions as evidenced by review of documented incoming telephone call notes and patient report Patient will work with BSW to address care coordination needs and will continue to work with the clinical team to address health care and disease management related needs as evidenced by documented adherence to scheduled care management/care coordination appointments - schedule appointment with eye doctor - check blood sugar at prescribed times: twice daily - check feet daily for cuts, sores or redness - enter blood sugar readings and medication or insulin into daily log - take the blood sugar log to all doctor visits - trim toenails straight across - drink 6 to 8 glasses of water each day - eat  fish at least once per week - fill half of plate with vegetables - limit fast food meals to no more than 1 per week - manage portion size - prepare main meal at home 3 to 5 days each week - read food labels for fat, fiber, carbohydrates and portion size - reduce red meat to 2 to 3 times a week - set a realistic goal - keep feet up while sitting - wash and dry feet carefully every day - wear comfortable, cotton socks - wear comfortable, well-fitting shoes - check blood pressure 3 times per week - choose a place to take my blood pressure (home, clinic or office, retail store) - write blood pressure results in a log or diary - learn about high blood pressure - keep a blood pressure log - take blood pressure log to all doctor appointments - call doctor for signs and symptoms of high blood pressure - develop an action plan for high blood pressure - keep all doctor appointments - take medications for blood pressure exactly as prescribed - report new symptoms to your doctor - eat more whole grains, fruits and vegetables, lean meats and healthy fats - call for medicine refill 2 or 3 days before it runs out - take all medications exactly as prescribed - call doctor with any symptoms you believe are related to your medicine - call doctor when you experience any new symptoms - go to all doctor appointments as scheduled - adhere to prescribed diet: heart healthy/ADA diet        Plan:Telephone follow up appointment with care management team member scheduled for:  11-10-2021 at [place] am  [name] RN, MSN, [facility] Mobile: [phone]

## 2021-09-08 NOTE — Patient Instructions (Signed)
Visit Information  PATIENT GOALS:  Goals Addressed             This Visit's Progress    RNCM: Prevent Falls and Injury       Follow Up Date 11/10/2021    - add more outdoor lighting - always use handrails on the stairs - always wear low-heeled or flat shoes or slippers with nonskid soles - call the doctor if I am feeling too drowsy - install bathroom grab bars - join an exercise group in my community - keep a flashlight by the bed - keep my cell phone with me always - learn how to get back up if I fall - make an emergency alert plan in case I fall - pick up clutter from the floors - use a nonslip pad with throw rugs, or remove them completely - use a cane or walker - use a nightlight in the bathroom - wear my glasses and/or hearing aid    Why is this important?   Most falls happen when it is hard for you to walk safely. Your balance may be off because of an illness. You may have pain in your knees, hip or other joints.  You may be overly tired or taking medicines that make you sleepy. You may not be able to see or hear clearly.  Falls can lead to broken bones, bruises or other injuries.  There are things you can do to help prevent falling.     Notes: The patient loves to go outside but has a hard time due to weight. She has purchased an electronic wheelchair from a friend that was going blind and she is able to stay outside longer and be more mobile. She is pacing her activity. Review of fall prevention and safety. 09-08-2021: The patient denies any falls. Remains safe.     RNCM: Track and Manage My Blood Pressure-Hypertension       Timeframe:  Long-Range Goal Priority:  High Start Date: 09-08-2021                            Expected End Date:     09-08-2022                  Follow Up Date 11/10/2021    - check blood pressure 3 times per week - choose a place to take my blood pressure (home, clinic or office, retail store) - write blood pressure results in a log or  diary    Why is this important?   You won't feel high blood pressure, but it can still hurt your blood vessels.  High blood pressure can cause heart or kidney problems. It can also cause a stroke.  Making lifestyle changes like losing a little weight or eating less salt will help.  Checking your blood pressure at home and at different times of the day can help to control blood pressure.  If the doctor prescribes medicine remember to take it the way the doctor ordered.  Call the office if you cannot afford the medicine or if there are questions about it.     09-08-2021: The patient has stable blood pressure. Is doing well. Knows how to manage her heart health. Will continue to monitor.      RNCM: Track and Manage My Symptoms-Chronic Kidney       Timeframe:  Long-Range Goal Priority:  High Start Date:   09-08-2021  Expected End Date:      09-08-2022                 Follow Up Date 11/10/2021    - balance periods of rest with activity as needed - develop a new routine to improve sleep - don't eat or exercise right before bedtime - exercise at least 2 to 3 times per week - get outdoors every day (weather permitting) - keep all lab appointments - keep room cool and dark - make shared treatment decisions with doctor - pace activity allowing for rest - take a warm shower or bath before bed - take medicine as prescribed - track symptoms and feelings in a journal or diary - use a fan or white noise in bedroom - use mild soap and lotion on itchy skin - watch for early signs of feeling worse    Why is this important?   Keeping track of symptoms can help you feel the best. It also helps the doctor stay on top of any changes to the disease. It may also help keep your disease from getting worse.  Taking simple steps can help you cope with symptoms like feeling very tired or itchy skin.     09-08-2021: The patient has started taking Jardiance for pre-diabetes and  kidney protection. She is peeing a lot but the pharmacist told her that would subside. She denies any issues with Jardiance or kidney function.         The patient verbalized understanding of instructions, educational materials, and care plan provided today and declined offer to receive copy of patient instructions, educational materials, and care plan.   Telephone follow up appointment with care management team member scheduled for: 11-10-2021 at [place] am  [name] RN, MSN, [facility] Mobile: [phone]

## 2021-09-13 DIAGNOSIS — I10 Essential (primary) hypertension: Secondary | ICD-10-CM

## 2021-09-13 DIAGNOSIS — N1832 Chronic kidney disease, stage 3b: Secondary | ICD-10-CM | POA: Diagnosis not present

## 2021-09-13 DIAGNOSIS — E7800 Pure hypercholesterolemia, unspecified: Secondary | ICD-10-CM

## 2021-09-13 DIAGNOSIS — I129 Hypertensive chronic kidney disease with stage 1 through stage 4 chronic kidney disease, or unspecified chronic kidney disease: Secondary | ICD-10-CM

## 2021-09-16 ENCOUNTER — Other Ambulatory Visit: Payer: Self-pay | Admitting: Nurse Practitioner

## 2021-09-16 DIAGNOSIS — I10 Essential (primary) hypertension: Secondary | ICD-10-CM

## 2021-09-16 DIAGNOSIS — I129 Hypertensive chronic kidney disease with stage 1 through stage 4 chronic kidney disease, or unspecified chronic kidney disease: Secondary | ICD-10-CM

## 2021-09-16 DIAGNOSIS — N1832 Chronic kidney disease, stage 3b: Secondary | ICD-10-CM

## 2021-09-16 NOTE — Telephone Encounter (Signed)
Requested Prescriptions  Pending Prescriptions Disp Refills  . potassium chloride SA (KLOR-CON) 20 MEQ tablet [Pharmacy Med Name: POTASSIUM CL ER 20 MEQ TABLET] 180 tablet 0    Sig: Take 1 tablet (20 mEq total) by mouth 2 (two) times daily.     Endocrinology:  Minerals - Potassium Supplementation Failed - 09/16/2021  2:18 PM      Failed - Cr in normal range and within 360 days    Creatinine  Date Value Ref Range Status  07/12/2014 1.37 (H) 0.60 - 1.30 mg/dL Final   Creatinine, Ser  Date Value Ref Range Status  07/12/2021 1.25 (H) 0.57 - 1.00 mg/dL Final         Passed - K in normal range and within 360 days    Potassium  Date Value Ref Range Status  07/12/2021 3.8 3.5 - 5.2 mmol/L Final  07/12/2014 3.4 (L) 3.5 - 5.1 mmol/L Final         Passed - Valid encounter within last 12 months    Recent Outpatient Visits          2 months ago Hypertensive kidney disease with stage 3b chronic kidney disease ([place])   [place], Karen, NP   8 months ago Annual physical exam   [facility] [name], NP   1 year ago Hypertensive kidney disease with stage 3 chronic kidney disease, unspecified whether stage 3a or 3b CKD   [place], Rachel Elizabeth, PA-C   1 year ago Hypertensive kidney disease with stage 3 chronic kidney disease, unspecified whether stage 3a or 3b CKD   [facility] Volney American, [place]   1 year ago Erroneous encounter - disregard   [facility] Volney American, [place]      Future Appointments            In 3 months [name], NP [place], [facility]   In 5 months  [facility], [facility]           . losartan (COZAAR) 100 MG tablet [Pharmacy Med Name: LOSARTAN POTASSIUM 100 MG TAB] 90 tablet 0    Sig: Take 1 tablet (100 mg total) by mouth daily.     Cardiovascular:  Angiotensin Receptor Blockers Failed - 09/16/2021  2:18 PM      Failed - Cr  in normal range and within 180 days    Creatinine  Date Value Ref Range Status  07/12/2014 1.37 (H) 0.60 - 1.30 mg/dL Final   Creatinine, Ser  Date Value Ref Range Status  07/12/2021 1.25 (H) 0.57 - 1.00 mg/dL Final         Passed - K in normal range and within 180 days    Potassium  Date Value Ref Range Status  07/12/2021 3.8 3.5 - 5.2 mmol/L Final  07/12/2014 3.4 (L) 3.5 - 5.1 mmol/L Final         Passed - Patient is not pregnant      Passed - Last BP in normal range    BP Readings from Last 1 Encounters:  07/12/21 120/78         Passed - Valid encounter within last 6 months    Recent Outpatient Visits          2 months ago Hypertensive kidney disease with stage 3b chronic kidney disease ([place])   [facility], NP   8 months ago Annual physical exam   [facility],  Santiago Glad, NP   1 year ago Hypertensive kidney disease with stage 3 chronic kidney disease, unspecified whether stage 3a or 3b CKD   [facility] Volney American, [place]   1 year ago Hypertensive kidney disease with stage 3 chronic kidney disease, unspecified whether stage 3a or 3b CKD   [facility] Volney American, [place]   1 year ago Erroneous encounter - disregard   [facility] Volney American, [place]      Future Appointments            In 3 months [name], NP [facility], [place]   In 5 months  [employer], [place]

## 2021-11-10 ENCOUNTER — Ambulatory Visit (INDEPENDENT_AMBULATORY_CARE_PROVIDER_SITE_OTHER): Payer: Medicare Other

## 2021-11-10 ENCOUNTER — Telehealth: Payer: Medicare Other

## 2021-11-10 DIAGNOSIS — R7303 Prediabetes: Secondary | ICD-10-CM

## 2021-11-10 DIAGNOSIS — E7800 Pure hypercholesterolemia, unspecified: Secondary | ICD-10-CM

## 2021-11-10 DIAGNOSIS — I129 Hypertensive chronic kidney disease with stage 1 through stage 4 chronic kidney disease, or unspecified chronic kidney disease: Secondary | ICD-10-CM

## 2021-11-10 DIAGNOSIS — I10 Essential (primary) hypertension: Secondary | ICD-10-CM

## 2021-11-10 NOTE — Patient Instructions (Signed)
Visit Information  Thank you for taking time to visit with me today. Please don't hesitate to contact me if I can be of assistance to you before our next scheduled telephone appointment.  Following are the goals we discussed today:  RNCM Clinical Goal(s):  Patient will verbalize understanding of plan for management of HTN, HLD, DMII, and CKD Stage 3  verbalize basic understanding of HTN, HLD, DMII, and CKD Stage 3 disease process and self health management plan  take all medications exactly as prescribed and will call provider for medication related questions demonstrate understanding of rationale for each prescribed medication  attend all scheduled medical appointments: 01-11-2022 at 1050 am demonstrate improved and ongoing adherence to prescribed treatment plan for HTN, HLD, DMII, and CKD Stage 3 as evidenced by daily monitoring and recording of CBG  adherence to ADA/ carb modified diet adherence to prescribed medication regimen contacting provider for new or worsened symptoms or questions  demonstrate improved and ongoing health management independence  demonstrate a decrease in HTN, HLD, DMII, and CKD Stage 3 exacerbations  demonstrate ongoing self health care management ability effective management of chronic conditions through collaboration with RN Care manager, provider, and care team.    Interventions: 1:1 collaboration with primary care provider regarding development and update of comprehensive plan of care as evidenced by provider attestation and co-signature Inter-disciplinary care team collaboration (see longitudinal plan of care) Evaluation of current treatment plan related to  self management and patient's adherence to plan as established by provider     SDOH Barriers (Status: Goal on track: YES.) Long Term Goal (>30 days) Patient interviewed and SDOH assessment performed        SDOH Interventions     Flowsheet Row Most Recent Value  SDOH Interventions    Food Insecurity  Interventions Intervention Not Indicated  Financial Strain Interventions Intervention Not Indicated  Housing Interventions Intervention Not Indicated  Intimate Partner Violence Interventions Intervention Not Indicated  Physical Activity Interventions Intervention Not Indicated  Stress Interventions Intervention Not Indicated  Social Connections Interventions Intervention Not Indicated  Transportation Interventions Intervention Not Indicated       Patient interviewed and appropriate assessments performed Provided patient with information about resources available for the patient in [facility] and care guides available to address needs as they arise related to SDOH Discussed plans with patient for ongoing care management follow up and provided patient with direct contact information for care management team Advised patient to call the office for changes in SDOH, questions or concerns       Chronic Kidney Disease (Status: Goal on track: YES.)  Last practice recorded BP readings:     BP Readings from Last 3 Encounters:  07/12/21 120/78  12/29/20 130/76  11/16/20 135/61  Most recent eGFR/CrCl:       Lab Results  Component Value Date    EGFR 48 (L) 07/12/2021    No components found for: CRCL   Assessed the patient     understanding of chronic kidney disease    Evaluation of current treatment plan related to chronic kidney disease self management and patient's adherence to plan as established by provider. 09-08-2021: The patient has started taking Jardiance, education and support given. The patient states she has been peeing a lot but the pharmacist told her that should subside. The patient states the cost is a little high but she is managing. Knows to call the CCM team for changes or needs related to CKD and effective management.   11-10-2021: The  patient is doing well and denies any changes in her kidney health. She states that she had her pacemaker placed recently and is not having  any issues. Will continue to monitor.  Provided education to patient re: stroke prevention, s/s of heart attack and stroke    Reviewed prescribed diet heart healthy/ADA diet. 11-10-2021: Is compliant with heart healthy/ADA diet  Reviewed medications with patient and discussed importance of compliance. 09-08-2021: Has been taking Jardiance since the end of August.   11-10-2021: Is compliant with medications  Advised patient, providing education and rationale, to monitor blood pressure daily and record, calling PCP for findings outside established parameters    Discussed complications of poorly controlled blood pressure such as heart disease, stroke, circulatory complications, vision complications, kidney impairment, sexual dysfunction    Reviewed scheduled/upcoming provider appointments including: 01-11-2022 at 13 am    Advised patient to discuss treatment options and recommendations for controlling progression of CKD with provider    Discussed plans with patient for ongoing care management follow up and provided patient with direct contact information for care management team    Screening for signs and symptoms of depression related to chronic disease state      Discussed the impact of chronic kidney disease on daily life and mental health and acknowledged and normalized feelings of disempowerment, fear, and frustration    Assessed social determinant of health barriers    Provided education on kidney disease progression    Engage patient in early, proactive and ongoing discussion about goals of care and what matters most to them    Support coping and stress management by recognizing current strategies and assist in developing new strategies such as mindfulness, journaling, relaxation techniques, problem-solving        Diabetes:  (Status: Goal on track: YES.)      Lab Results  Component Value Date    HGBA1C 6.5 (H) 07/12/2021  Assessed patient's understanding of A1c goal: <7% Provided  education to patient about basic DM disease process; Reviewed medications with patient and discussed importance of medication adherence. 09-08-2021: The patient is taking Jardiance. Is "peeing a lot", but denies any acute distress. States cost is a little high but she is managing. 11-10-2021: States compliance with medications and blood sugar range is good. ;        Reviewed prescribed diet with patient heart healthy/ADA diet. 11-10-2021: The patient does a lot of cooking. Her husband is diabetic and she fixes a lot of vegetables and salads for them.  She is mindful of watching sodium, fats, and sugars. ; Counseled on importance of regular laboratory monitoring as prescribed;        Discussed plans with patient for ongoing care management follow up and provided patient with direct contact information for care management team;      Provided patient with written educational materials related to hypo and hyperglycemia and importance of correct treatment. 09-08-2021: Review of sx and sx. The patient has been to an education class with her husband and is aware of factors that cause blood sugars to change. States her blood sugars have been in 120's.       Reviewed scheduled/upcoming provider appointments including: 01-11-2022 at 1050 am;         Advised patient, providing education and rationale, to check cbg as directed and record.  09-08-2021: Review of blood sugars fasting <130 and post prandial of <180. States her range has been in 120's. She is mindful of dietary restrictions as her husband is  diabetic also. Will continue to monitor.    11-10-2021: The patient has a range of blood sugars maintaining in the 120's.    call provider for findings outside established parameters;       Review of patient status, including review of consultants reports, relevant laboratory and other test results, and medications completed;     11-10-2021: Ask about diabetic shoes. The patient has a follow up appointment with pcp in  February. The patient advised to discuss with the pcp about a script for diabetic shoes. She does check her feet daily for cuts and sores. Education and support given.      Hyperlipidemia:  (Status: Goal on track: YES.)      Lab Results  Component Value Date    CHOL 157 07/12/2021    HDL 49 07/12/2021    LDLCALC 92 07/12/2021    TRIG 86 07/12/2021    CHOLHDL 3.2 07/12/2021      Medication review performed; medication list updated in electronic medical record.  Provider established cholesterol goals reviewed; Counseled on importance of regular laboratory monitoring as prescribed. 11-10-2021: Review of labs and the patient is currently at goal; Provided HLD educational materials; Reviewed role and benefits of statin for ASCVD risk reduction; Discussed strategies to manage statin-induced myalgias; Reviewed importance of limiting foods high in cholesterol. 11-10-2021: Review of foods high in cholesterol. The patient does a good job of watching what she eats and maintaining her health and well being;   Hypertension: (Status: Goal on track: YES.) Last practice recorded BP readings:     BP Readings from Last 3 Encounters:  07/12/21 120/78  12/29/20 130/76  11/16/20 135/61  Most recent eGFR/CrCl:       Lab Results  Component Value Date    EGFR 48 (L) 07/12/2021    No components found for: CRCL   Evaluation of current treatment plan related to hypertension self management and patient's adherence to plan as established by provider. 11-10-2021: Denies any issues with HTN or heart health. Had her pacemaker placed and is doing well. No issues related to her pacemaker. She is mindful of factors that impact her health and well being. Will continue to monitor for changes or new needs.;   Provided education to patient re: stroke prevention, s/s of heart attack and stroke; Reviewed prescribed diet heart healthy/ADA Reviewed medications with patient and discussed importance of compliance;   Discussed plans with patient for ongoing care management follow up and provided patient with direct contact information for care management team; Advised patient, providing education and rationale, to monitor blood pressure daily and record, calling PCP for findings outside established parameters;  Reviewed scheduled/upcoming provider appointments including:  Provided education on prescribed diet Heart healthy/ADA diet ;  Discussed complications of poorly controlled blood pressure such as heart disease, stroke, circulatory complications, vision complications, kidney impairment, sexual dysfunction;  Screening for signs and symptoms of depression related to chronic disease state;  Assessed social determinant of health barriers;    Patient Goals/Self-Care Activities: Patient will self administer medications as prescribed as evidenced by self report/primary caregiver report  Patient will attend all scheduled provider appointments as evidenced by clinician review of documented attendance to scheduled appointments and patient/caregiver report Patient will call pharmacy for medication refills as evidenced by patient report and review of pharmacy fill history as appropriate Patient will attend church or other social activities as evidenced by patient report Patient will continue to perform ADL's independently as evidenced by patient/caregiver report Patient will continue to  perform IADL's independently as evidenced by patient/caregiver report Patient will call provider office for new concerns or questions as evidenced by review of documented incoming telephone call notes and patient report Patient will work with BSW to address care coordination needs and will continue to work with the clinical team to address health care and disease management related needs as evidenced by documented adherence to scheduled care management/care coordination appointments - schedule appointment with eye doctor - check blood  sugar at prescribed times: twice daily - check feet daily for cuts, sores or redness - enter blood sugar readings and medication or insulin into daily log - take the blood sugar log to all doctor visits - trim toenails straight across - drink 6 to 8 glasses of water each day - eat fish at least once per week - fill half of plate with vegetables - limit fast food meals to no more than 1 per week - manage portion size - prepare main meal at home 3 to 5 days each week - read food labels for fat, fiber, carbohydrates and portion size - reduce red meat to 2 to 3 times a week - set a realistic goal - keep feet up while sitting - wash and dry feet carefully every day - wear comfortable, cotton socks - wear comfortable, well-fitting shoes - check blood pressure 3 times per week - choose a place to take my blood pressure (home, clinic or office, retail store) - write blood pressure results in a log or diary - learn about high blood pressure - keep a blood pressure log - take blood pressure log to all doctor appointments - call doctor for signs and symptoms of high blood pressure - develop an action plan for high blood pressure - keep all doctor appointments - take medications for blood pressure exactly as prescribed - report new symptoms to your doctor - eat more whole grains, fruits and vegetables, lean meats and healthy fats - call for medicine refill 2 or 3 days before it runs out - take all medications exactly as prescribed - call doctor with any symptoms you believe are related to your medicine - call doctor when you experience any new symptoms - go to all doctor appointments as scheduled - adhere to prescribed diet: heart healthy/ADA diet       Our next appointment is by telephone on 01-05-2022 at 0900 am  Please call the care guide team at [phone] if you need to cancel or reschedule your appointment.   If you are experiencing a Mental Health or [place] or  need someone to talk to, please call the Suicide and Crisis Lifeline: 988 call the [place] National Suicide Prevention Lifeline: (548)254-4095 or TTY: 4690234813 TTY 361-067-6976) to talk to a trained counselor call 1-800-273-TALK (toll free, 24 hour hotline)   The patient verbalized understanding of instructions, educational materials, and care plan provided today and declined offer to receive copy of patient instructions, educational materials, and care plan.   [name] RN, MSN, [facility] Mobile: [phone]

## 2021-11-10 NOTE — Chronic Care Management (AMB) (Signed)
Chronic Care Management   CCM RN Visit Note  11/10/2021 Name: Mary Webb MRN: [id number] DOB: September 24, 1956  Subjective: Mary Webb is a 65 y.o. year old female who is a primary care patient of [name], NP. The care management team was consulted for assistance with disease management and care coordination needs.    Engaged with patient by telephone for follow up visit in response to provider referral for case management and/or care coordination services.   Consent to Services:  The patient was given information about Chronic Care Management services, agreed to services, and gave verbal consent prior to initiation of services.  Please see initial visit note for detailed documentation.   Patient agreed to services and verbal consent obtained.   Assessment: Review of patient past medical history, allergies, medications, health status, including review of consultants reports, laboratory and other test data, was performed as part of comprehensive evaluation and provision of chronic care management services.   SDOH (Social Determinants of Health) assessments and interventions performed:    CCM Care Plan  Allergies  Allergen Reactions   Elemental Sulfur Anaphylaxis   Levaquin [Levofloxacin] Other (See Comments)    Stroke   Tekturna [Aliskiren] Shortness Of Breath   Accupril [Quinapril Hcl]    Aspirin    Diovan [Valsartan]    Erythromycin    Iodine    Latex     Can't remember reaction    Lotensin [Benazepril]    Norvasc [Amlodipine]    Shellfish Allergy Swelling   Sulfa Antibiotics    Tetanus Toxoids    Tetracycline     Other reaction(s): Unknown   Tetracyclines & Related    Penicillins Rash    Outpatient Encounter Medications as of 11/10/2021  Medication Sig   Accu-Chek Softclix Lancets lancets Use to check blood sugar 3 times a day and document for visits. Goal is <130 fasting and <180 two hours after meal.,   Ascorbic Acid (VITAMIN C) 1000 MG tablet  Take by mouth. Every other day   Blood Glucose Monitoring Suppl (ACCU-CHEK AVIVA PLUS) w/Device KIT Use to check blood sugar 3 times a day and document for visits.  Goal is <130 fasting and <180 two hours after meal.   chlorthalidone (HYGROTON) 25 MG tablet Take 1 tablet (25 mg total) by mouth daily.   Cholecalciferol (VITAMIN D3) 1000 units CAPS Take by mouth. Taking once a week   clopidogrel (PLAVIX) 75 MG tablet Take 1 tablet (75 mg total) by mouth daily.   Cyanocobalamin (VITAMIN B 12) 500 MCG TABS Take by mouth daily.   diclofenac Sodium (VOLTAREN) 1 % GEL Apply 2 g topically 4 (four) times daily.   empagliflozin (JARDIANCE) 10 MG TABS tablet Take 1 tablet (10 mg total) by mouth daily before breakfast.   fluticasone furoate-vilanterol (BREO ELLIPTA) 100-25 MCG/INH AEPB Inhale 1 puff into the lungs daily.   glucose blood (ACCU-CHEK AVIVA PLUS) test strip Use to check blood sugar 3 times a day and document for visits. Goal is <130 fasting and <180 two hours after meal.,   losartan (COZAAR) 100 MG tablet Take 1 tablet (100 mg total) by mouth daily.   potassium chloride SA (KLOR-CON) 20 MEQ tablet Take 1 tablet (20 mEq total) by mouth 2 (two) times daily.   pyridOXINE (VITAMIN B-6) 25 MG tablet Take by mouth daily.    No facility-administered encounter medications on file as of 11/10/2021.    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   Pituitary adenoma ([place]) 04/28/2015   History of CVA (cerebrovascular accident) 04/28/2015   Adult body mass index 60.0-69.9 ([place]) 04/28/2015   Hyperlipidemia 04/28/2015   Stroke ([place]) 04/28/2015    Conditions to be addressed/monitored:HTN, HLD, DMII, and CKD Stage 3  Care Plan : RNCM; General Plan of Care  (Adult) for Chronic Disease Management and Care Coordination Needs  Updates made by [name], RN since 11/10/2021 12:00 AM     Problem: RNCM: Development of Plan of Care for Chronic Disease Management and Care coordination Needs( HTN, HLD, CKD3, Pre-DM2)   Priority: High     Long-Range Goal: RNCM: Effective management  of Plan of Care for Chronic Disease Management and Care coordination Needs( HTN, HLD, CKD3, Pre-DM2)   Start Date: 09/08/2021  Expected End Date: 09/08/2022  Priority: High  Note:   Current Barriers:  Knowledge Deficits related to plan of care for management of HTN, HLD, DMII, and CKD Stage 3  Chronic Disease Management support and education needs related to HTN, HLD, DMII, and CKD Stage 3  RNCM Clinical Goal(s):  Patient will verbalize understanding of plan for management of HTN, HLD, DMII, and CKD Stage 3  verbalize basic understanding of HTN, HLD, DMII, and CKD Stage 3 disease process and self health management plan  take all medications exactly as prescribed and will call provider for medication related questions demonstrate understanding of rationale for each prescribed medication  attend all scheduled medical appointments: 01-11-2022 at 1050 am demonstrate improved and ongoing adherence to prescribed treatment plan for HTN, HLD, DMII, and CKD Stage 3 as evidenced by daily monitoring and recording of CBG  adherence to ADA/ carb modified diet adherence to prescribed medication regimen contacting provider for new or worsened symptoms or questions  demonstrate improved and ongoing health management independence  demonstrate a decrease in HTN, HLD, DMII, and CKD Stage 3 exacerbations  demonstrate ongoing self health care management ability effective management of chronic conditions through collaboration with RN Care manager, provider, and care team.   Interventions: 1:1 collaboration with primary care provider regarding development and update of comprehensive plan of  care as evidenced by provider attestation and co-signature Inter-disciplinary care team collaboration (see longitudinal plan of care) Evaluation of current treatment plan related to  self management and patient's adherence to plan as established by provider   SDOH Barriers (Status: Goal on track: YES.) Long Term Goal (>30 days) Patient interviewed and SDOH assessment performed        SDOH Interventions    Flowsheet Row Most Recent Value  SDOH Interventions   Food Insecurity Interventions Intervention Not Indicated  Financial Strain Interventions Intervention Not Indicated  Housing Interventions Intervention Not Indicated  Intimate Partner Violence Interventions Intervention Not Indicated  Physical Activity Interventions Intervention Not Indicated  Stress Interventions Intervention Not Indicated  Social Connections Interventions Intervention Not Indicated  Transportation Interventions Intervention Not Indicated     Patient interviewed and appropriate assessments performed Provided patient with information about resources available for the patient in [facility] and care guides available to address needs as they arise related to SDOH Discussed plans with patient for ongoing care management follow up and provided patient with direct contact information for care management team Advised patient to call the office for changes in [place], questions or concerns  Chronic Kidney Disease (Status: Goal on track: YES.)  Last practice recorded BP readings:  BP Readings from Last 3 Encounters:  07/12/21 120/78  12/29/20 130/76  11/16/20 135/61  Most recent eGFR/CrCl:  Lab Results  Component Value Date   EGFR 48 (L) 07/12/2021    No components found for: CRCL  Assessed the patient     understanding of chronic kidney disease    Evaluation of current treatment plan related to chronic kidney disease self management and patient's adherence to plan as established by provider. 09-08-2021: The  patient has started taking Jardiance, education and support given. The patient states she has been peeing a lot but the pharmacist told her that should subside. The patient states the cost is a little high but she is managing. Knows to call the CCM team for changes or needs related to CKD and effective management.   11-10-2021: The patient is doing well and denies any changes in her kidney health. She states that she had her pacemaker placed recently and is not having any issues. Will continue to monitor.  Provided education to patient re: stroke prevention, s/s of heart attack and stroke    Reviewed prescribed diet heart healthy/ADA diet. 11-10-2021: Is compliant with heart healthy/ADA diet  Reviewed medications with patient and discussed importance of compliance. 09-08-2021: Has been taking Jardiance since the end of August.   11-10-2021: Is compliant with medications  Advised patient, providing education and rationale, to monitor blood pressure daily and record, calling PCP for findings outside established parameters    Discussed complications of poorly controlled blood pressure such as heart disease, stroke, circulatory complications, vision complications, kidney impairment, sexual dysfunction    Reviewed scheduled/upcoming provider appointments including: 01-11-2022 at 43 am    Advised patient to discuss treatment options and recommendations for controlling progression of CKD with provider    Discussed plans with patient for ongoing care management follow up and provided patient with direct contact information for care management team    Screening for signs and symptoms of depression related to chronic disease state      Discussed the impact of chronic kidney disease on daily life and mental health and acknowledged and normalized feelings of disempowerment, fear, and frustration    Assessed social determinant of health barriers    Provided education on kidney disease progression    Engage patient  in early, proactive and ongoing discussion about goals of care and what matters most to them    Support coping and stress management by recognizing current strategies and assist in developing new strategies such as mindfulness, journaling, relaxation techniques, problem-solving      Diabetes:  (Status: Goal on track: YES.) Lab Results  Component Value Date   HGBA1C 6.5 (H) 07/12/2021  Assessed patient's understanding of A1c goal: <7% Provided education to patient about basic DM disease process; Reviewed medications with patient and discussed importance of medication adherence. 09-08-2021: The patient is taking Jardiance. Is "peeing a lot", but denies any acute distress. States cost is a little high but she is managing. 11-10-2021: States compliance with medications and blood sugar range is good. ;        Reviewed prescribed diet with patient heart healthy/ADA diet. 11-10-2021: The patient does a lot of cooking. Her husband is diabetic and she fixes a lot of vegetables and salads for them.  She is mindful of watching sodium, fats, and sugars. ; Counseled on importance of regular laboratory monitoring as prescribed;  Discussed plans with patient for ongoing care management follow up and provided patient with direct contact information for care management team;      Provided patient with written educational materials related to hypo and hyperglycemia and importance of correct treatment. 09-08-2021: Review of sx and sx. The patient has been to an education class with her husband and is aware of factors that cause blood sugars to change. States her blood sugars have been in 120's.       Reviewed scheduled/upcoming provider appointments including: 01-11-2022 at 1050 am;         Advised patient, providing education and rationale, to check cbg as directed and record.  09-08-2021: Review of blood sugars fasting <130 and post prandial of <180. States her range has been in 120's. She is mindful of dietary  restrictions as her husband is diabetic also. Will continue to monitor.    11-10-2021: The patient has a range of blood sugars maintaining in the 120's.    call provider for findings outside established parameters;       Review of patient status, including review of consultants reports, relevant laboratory and other test results, and medications completed;     11-10-2021: Ask about diabetic shoes. The patient has a follow up appointment with pcp in February. The patient advised to discuss with the pcp about a script for diabetic shoes. She does check her feet daily for cuts and sores. Education and support given.     Hyperlipidemia:  (Status: Goal on track: YES.) Lab Results  Component Value Date   CHOL 157 07/12/2021   HDL 49 07/12/2021   LDLCALC 92 07/12/2021   TRIG 86 07/12/2021   CHOLHDL 3.2 07/12/2021     Medication review performed; medication list updated in electronic medical record.  Provider established cholesterol goals reviewed; Counseled on importance of regular laboratory monitoring as prescribed. 11-10-2021: Review of labs and the patient is currently at goal; Provided HLD educational materials; Reviewed role and benefits of statin for ASCVD risk reduction; Discussed strategies to manage statin-induced myalgias; Reviewed importance of limiting foods high in cholesterol. 11-10-2021: Review of foods high in cholesterol. The patient does a good job of watching what she eats and maintaining her health and well being;  Hypertension: (Status: Goal on track: YES.) Last practice recorded BP readings:  BP Readings from Last 3 Encounters:  07/12/21 120/78  12/29/20 130/76  11/16/20 135/61  Most recent eGFR/CrCl:  Lab Results  Component Value Date   EGFR 48 (L) 07/12/2021    No components found for: CRCL  Evaluation of current treatment plan related to hypertension self management and patient's adherence to plan as established by provider. 11-10-2021: Denies any issues with HTN  or heart health. Had her pacemaker placed and is doing well. No issues related to her pacemaker. She is mindful of factors that impact her health and well being. Will continue to monitor for changes or new needs.;   Provided education to patient re: stroke prevention, s/s of heart attack and stroke; Reviewed prescribed diet heart healthy/ADA Reviewed medications with patient and discussed importance of compliance;  Discussed plans with patient for ongoing care management follow up and provided patient with direct contact information for care management team; Advised patient, providing education and rationale, to monitor blood pressure daily and record, calling PCP for findings outside established parameters;  Reviewed scheduled/upcoming provider appointments including:  Provided education on prescribed diet Heart healthy/ADA diet ;  Discussed complications of poorly controlled blood pressure such as heart  disease, stroke, circulatory complications, vision complications, kidney impairment, sexual dysfunction;  Screening for signs and symptoms of depression related to chronic disease state;  Assessed social determinant of health barriers;   Patient Goals/Self-Care Activities: Patient will self administer medications as prescribed as evidenced by self report/primary caregiver report  Patient will attend all scheduled provider appointments as evidenced by clinician review of documented attendance to scheduled appointments and patient/caregiver report Patient will call pharmacy for medication refills as evidenced by patient report and review of pharmacy fill history as appropriate Patient will attend church or other social activities as evidenced by patient report Patient will continue to perform ADL's independently as evidenced by patient/caregiver report Patient will continue to perform IADL's independently as evidenced by patient/caregiver report Patient will call provider office for new concerns or  questions as evidenced by review of documented incoming telephone call notes and patient report Patient will work with BSW to address care coordination needs and will continue to work with the clinical team to address health care and disease management related needs as evidenced by documented adherence to scheduled care management/care coordination appointments - schedule appointment with eye doctor - check blood sugar at prescribed times: twice daily - check feet daily for cuts, sores or redness - enter blood sugar readings and medication or insulin into daily log - take the blood sugar log to all doctor visits - trim toenails straight across - drink 6 to 8 glasses of water each day - eat fish at least once per week - fill half of plate with vegetables - limit fast food meals to no more than 1 per week - manage portion size - prepare main meal at home 3 to 5 days each week - read food labels for fat, fiber, carbohydrates and portion size - reduce red meat to 2 to 3 times a week - set a realistic goal - keep feet up while sitting - wash and dry feet carefully every day - wear comfortable, cotton socks - wear comfortable, well-fitting shoes - check blood pressure 3 times per week - choose a place to take my blood pressure (home, clinic or office, retail store) - write blood pressure results in a log or diary - learn about high blood pressure - keep a blood pressure log - take blood pressure log to all doctor appointments - call doctor for signs and symptoms of high blood pressure - develop an action plan for high blood pressure - keep all doctor appointments - take medications for blood pressure exactly as prescribed - report new symptoms to your doctor - eat more whole grains, fruits and vegetables, lean meats and healthy fats - call for medicine refill 2 or 3 days before it runs out - take all medications exactly as prescribed - call doctor with any symptoms you believe are  related to your medicine - call doctor when you experience any new symptoms - go to all doctor appointments as scheduled - adhere to prescribed diet: heart healthy/ADA diet        Plan:Telephone follow up appointment with care management team member scheduled for:  01-05-2022 at 0900 am  [name] RN, MSN, [facility] Mobile: [phone]

## 2021-11-13 DIAGNOSIS — E7800 Pure hypercholesterolemia, unspecified: Secondary | ICD-10-CM

## 2021-11-13 DIAGNOSIS — I129 Hypertensive chronic kidney disease with stage 1 through stage 4 chronic kidney disease, or unspecified chronic kidney disease: Secondary | ICD-10-CM

## 2021-11-13 DIAGNOSIS — N1832 Chronic kidney disease, stage 3b: Secondary | ICD-10-CM

## 2021-11-13 DIAGNOSIS — I10 Essential (primary) hypertension: Secondary | ICD-10-CM

## 2021-12-02 ENCOUNTER — Encounter: Payer: Self-pay | Admitting: Pharmacist

## 2021-12-02 NOTE — Progress Notes (Signed)
[facility])                                            [place] Team                                        Statin Quality Measure Assessment    12/02/2021  GREER WAINRIGHT 1955-11-15 [id number]   Per review of chart and payor information, patient has a diagnosis of cardiovascular disease but is not currently filling a statin prescription.  This places patient into the [facility] (Statin Use In Patients with Cardiovascular Disease) measure for CMS.    NOTED: Patient was prescribed rosuvastatin in the past, however has declined statin therapy since. No documented intolerances or adverse reactions to statin medications in chart.      Component Value Date/Time   CHOL 157 07/12/2021 0953   CHOL 152 05/23/2017 0933   TRIG 86 07/12/2021 0953   TRIG 91 05/23/2017 0933   HDL 49 07/12/2021 0953   CHOLHDL 3.2 07/12/2021 0953   VLDL 18 05/23/2017 0933   LDLCALC 92 07/12/2021 0953     Please consider ONE of the following recommendations:  Initiate high intensity statin Atorvastatin 40mg  once daily, #90, 3 refills   Rosuvastatin 20mg  once daily, #90, 3 refills    Initiate moderate intensity  statin with reduced frequency if prior  statin intolerance 1x weekly, #13, 3 refills   2x weekly, #26, 3 refills   3x weekly, #39, 3 refills    Code for past statin intolerance  (required annually)  Provider Requirements: Must asociate code during an office visit or telehealth encounter   Drug Induced Myopathy G72.0   Myalgia M79.1   Myositis, unspecified M60.9   Myopathy, unspecified G72.9   Rhabdomyolysis M62.82     Loretha Brasil, PharmD [place] Pharmacist Office: [phone]

## 2022-01-05 ENCOUNTER — Other Ambulatory Visit: Payer: Self-pay | Admitting: Nurse Practitioner

## 2022-01-05 ENCOUNTER — Ambulatory Visit (INDEPENDENT_AMBULATORY_CARE_PROVIDER_SITE_OTHER): Payer: Medicare Other

## 2022-01-05 ENCOUNTER — Telehealth: Payer: Medicare Other

## 2022-01-05 DIAGNOSIS — N1832 Chronic kidney disease, stage 3b: Secondary | ICD-10-CM

## 2022-01-05 DIAGNOSIS — I10 Essential (primary) hypertension: Secondary | ICD-10-CM

## 2022-01-05 DIAGNOSIS — R7303 Prediabetes: Secondary | ICD-10-CM

## 2022-01-05 DIAGNOSIS — E7800 Pure hypercholesterolemia, unspecified: Secondary | ICD-10-CM

## 2022-01-05 NOTE — Patient Instructions (Signed)
Visit Information  Thank you for taking time to visit with me today. Please don't hesitate to contact me if I can be of assistance to you before our next scheduled telephone appointment.  Following are the goals we discussed today:  RNCM Clinical Goal(s):  Patient will verbalize understanding of plan for management of HTN, HLD, DMII, and CKD Stage 3  verbalize basic understanding of HTN, HLD, DMII, and CKD Stage 3 disease process and self health management plan  take all medications exactly as prescribed and will call provider for medication related questions demonstrate understanding of rationale for each prescribed medication  attend all scheduled medical appointments:01-21-2022 demonstrate improved and ongoing adherence to prescribed treatment plan for HTN, HLD, DMII, and CKD Stage 3 as evidenced by daily monitoring and recording of CBG  adherence to ADA/ carb modified diet adherence to prescribed medication regimen contacting provider for new or worsened symptoms or questions  demonstrate improved and ongoing health management independence  demonstrate a decrease in HTN, HLD, DMII, and CKD Stage 3 exacerbations  demonstrate ongoing self health care management ability effective management of chronic conditions through collaboration with RN Care manager, provider, and care team.    Interventions: 1:1 collaboration with primary care provider regarding development and update of comprehensive plan of care as evidenced by provider attestation and co-signature Inter-disciplinary care team collaboration (see longitudinal plan of care) Evaluation of current treatment plan related to  self management and patient's adherence to plan as established by provider     SDOH Barriers (Status: Goal on track: YES.) Long Term Goal (>30 days) Patient interviewed and SDOH assessment performed        SDOH Interventions     Flowsheet Row Most Recent Value  SDOH Interventions    Food Insecurity Interventions  Intervention Not Indicated  Financial Strain Interventions Intervention Not Indicated  Housing Interventions Intervention Not Indicated  Intimate Partner Violence Interventions Intervention Not Indicated  Physical Activity Interventions Intervention Not Indicated  Stress Interventions Intervention Not Indicated  Social Connections Interventions Intervention Not Indicated  Transportation Interventions Intervention Not Indicated       Patient interviewed and appropriate assessments performed Provided patient with information about resources available for the patient in [facility] and care guides available to address needs as they arise related to SDOH Discussed plans with patient for ongoing care management follow up and provided patient with direct contact information for care management team Advised patient to call the office for changes in SDOH, questions or concerns       Chronic Kidney Disease (Status: Goal on track: YES.)  Last practice recorded BP readings:     BP Readings from Last 3 Encounters:  07/12/21 120/78  12/29/20 130/76  11/16/20 135/61  Most recent eGFR/CrCl:       Lab Results  Component Value Date    EGFR 48 (L) 07/12/2021    No components found for: CRCL   Assessed the patient     understanding of chronic kidney disease    Evaluation of current treatment plan related to chronic kidney disease self management and patient's adherence to plan as established by provider. 09-08-2021: The patient has started taking Jardiance, education and support given. The patient states she has been peeing a lot but the pharmacist told her that should subside. The patient states the cost is a little high but she is managing. Knows to call the CCM team for changes or needs related to CKD and effective management.   11-10-2021: The patient is doing well  and denies any changes in her kidney health. She states that she had her pacemaker placed recently and is not having any issues.  Will continue to monitor. 01-05-2022: The patient is doing well and denies any issues related to kidney function. Discussed dietary restrictions and other factors that impact her kidney health. The patient is compliant with medications and plan of care.  Provided education to patient re: stroke prevention, s/s of heart attack and stroke    Reviewed prescribed diet heart healthy/ADA diet. 01-05-2022: Is compliant with heart healthy/ADA diet  Reviewed medications with patient and discussed importance of compliance. 09-08-2021: Has been taking Jardiance since the end of August.   01-05-2022: Is compliant with medications  Advised patient, providing education and rationale, to monitor blood pressure daily and record, calling PCP for findings outside established parameters    Discussed complications of poorly controlled blood pressure such as heart disease, stroke, circulatory complications, vision complications, kidney impairment, sexual dysfunction    Reviewed scheduled/upcoming provider appointments including:01-21-2022   Advised patient to discuss treatment options and recommendations for controlling progression of CKD with provider    Discussed plans with patient for ongoing care management follow up and provided patient with direct contact information for care management team    Screening for signs and symptoms of depression related to chronic disease state      Discussed the impact of chronic kidney disease on daily life and mental health and acknowledged and normalized feelings of disempowerment, fear, and frustration    Assessed social determinant of health barriers    Provided education on kidney disease progression    Engage patient in early, proactive and ongoing discussion about goals of care and what matters most to them    Support coping and stress management by recognizing current strategies and assist in developing new strategies such as mindfulness, journaling, relaxation techniques,  problem-solving        Diabetes:  (Status: Goal on track: YES.)      Lab Results  Component Value Date    HGBA1C 6.5 (H) 07/12/2021  Assessed patient's understanding of A1c goal: <7% Provided education to patient about basic DM disease process; Reviewed medications with patient and discussed importance of medication adherence. 09-08-2021: The patient is taking Jardiance. Is "peeing a lot", but denies any acute distress. States cost is a little high but she is managing. 01-05-2022: States compliance with medications and blood sugar range is good. ;        Reviewed prescribed diet with patient heart healthy/ADA diet. 01-05-2022: The patient does a lot of cooking. Her husband is diabetic and she fixes a lot of vegetables and salads for them.  She is mindful of watching sodium, fats, and sugars. Likes herbs and cooking with organic products; Counseled on importance of regular laboratory monitoring as prescribed;        Discussed plans with patient for ongoing care management follow up and provided patient with direct contact information for care management team;      Provided patient with written educational materials related to hypo and hyperglycemia and importance of correct treatment. 09-08-2021: Review of sx and sx. The patient has been to an education class with her husband and is aware of factors that cause blood sugars to change. States her blood sugars have been in 120's. 01-05-2022: The patient is very mindful of her blood sugars. States the lowest she has seen is 78 and the highest 180. Will continue to monitor.       Reviewed scheduled/upcoming  provider appointments including: 01-21-2022         Advised patient, providing education and rationale, to check cbg as directed and record.  09-08-2021: Review of blood sugars fasting <130 and post prandial of <180. States her range has been in 120's. She is mindful of dietary restrictions as her husband is diabetic also. Will continue to monitor.     01-05-2022: The patient has a range of blood sugars 78 to 180. Knows goals call provider for findings outside established parameters;       Review of patient status, including review of consultants reports, relevant laboratory and other test results, and medications completed;     11-10-2021: Ask about diabetic shoes. The patient has a follow up appointment with pcp in February. The patient advised to discuss with the pcp about a script for diabetic shoes. She does check her feet daily for cuts and sores. Education and support given.      Hyperlipidemia:  (Status: Goal on track: YES.)      Lab Results  Component Value Date    CHOL 157 07/12/2021    HDL 49 07/12/2021    LDLCALC 92 07/12/2021    TRIG 86 07/12/2021    CHOLHDL 3.2 07/12/2021      Medication review performed; medication list updated in electronic medical record. 01-05-2022: The patient had a [facility] of [facility] and recommendations given for statin. The patient is currently not taking a statin. She sees the cardiologist in April and will likely discuss then. Also sees the pcp in March. Education and support given.  Provider established cholesterol goals reviewed. 01-05-2022: The patient is at goal. The patient states that she is doing well; Counseled on importance of regular laboratory monitoring as prescribed. 01-05-2022: Review of labs and the patient is currently at goal; Provided HLD educational materials; Reviewed role and benefits of statin for ASCVD risk reduction; Discussed strategies to manage statin-induced myalgias; Reviewed importance of limiting foods high in cholesterol. 01-05-2022: Review of foods high in cholesterol. The patient does a good job of watching what she eats and maintaining her health and well being;   Hypertension: (Status: Goal on track: YES.) Last practice recorded BP readings:     BP Readings from Last 3 Encounters:  07/12/21 120/78  12/29/20 130/76  11/16/20 135/61  Most  recent eGFR/CrCl:       Lab Results  Component Value Date    EGFR 48 (L) 07/12/2021    No components found for: CRCL   Evaluation of current treatment plan related to hypertension self management and patient's adherence to plan as established by provider. 11-10-2021: Denies any issues with HTN or heart health. Had her pacemaker placed and is doing well. No issues related to her pacemaker. She is mindful of factors that impact her health and well being. Will continue to monitor for changes or new needs. 01-05-2022: The patient is doing well and denies any new concerns related to HTN or heart health. Will see her cardiologist for follow up in April;   Provided education to patient re: stroke prevention, s/s of heart attack and stroke; Reviewed prescribed diet heart healthy/ADA Reviewed medications with patient and discussed importance of compliance. 01-05-2022: Is compliant with medications. ;  Discussed plans with patient for ongoing care management follow up and provided patient with direct contact information for care management team; Advised patient, providing education and rationale, to monitor blood pressure daily and record, calling PCP for findings outside established parameters;  Reviewed scheduled/upcoming provider  appointments including:  Provided education on prescribed diet Heart healthy/ADA diet ;  Discussed complications of poorly controlled blood pressure such as heart disease, stroke, circulatory complications, vision complications, kidney impairment, sexual dysfunction;  Screening for signs and symptoms of depression related to chronic disease state;  Assessed social determinant of health barriers;    Patient Goals/Self-Care Activities: Patient will self administer medications as prescribed as evidenced by self report/primary caregiver report  Patient will attend all scheduled provider appointments as evidenced by clinician review of documented attendance to scheduled appointments  and patient/caregiver report Patient will call pharmacy for medication refills as evidenced by patient report and review of pharmacy fill history as appropriate Patient will attend church or other social activities as evidenced by patient report Patient will continue to perform ADL's independently as evidenced by patient/caregiver report Patient will continue to perform IADL's independently as evidenced by patient/caregiver report Patient will call provider office for new concerns or questions as evidenced by review of documented incoming telephone call notes and patient report Patient will work with BSW to address care coordination needs and will continue to work with the clinical team to address health care and disease management related needs as evidenced by documented adherence to scheduled care management/care coordination appointments - schedule appointment with eye doctor - check blood sugar at prescribed times: twice daily - check feet daily for cuts, sores or redness - enter blood sugar readings and medication or insulin into daily log - take the blood sugar log to all doctor visits - trim toenails straight across - drink 6 to 8 glasses of water each day - eat fish at least once per week - fill half of plate with vegetables - limit fast food meals to no more than 1 per week - manage portion size - prepare main meal at home 3 to 5 days each week - read food labels for fat, fiber, carbohydrates and portion size - reduce red meat to 2 to 3 times a week - set a realistic goal - keep feet up while sitting - wash and dry feet carefully every day - wear comfortable, cotton socks - wear comfortable, well-fitting shoes - check blood pressure 3 times per week - choose a place to take my blood pressure (home, clinic or office, retail store) - write blood pressure results in a log or diary - learn about high blood pressure - keep a blood pressure log - take blood pressure log to all  doctor appointments - call doctor for signs and symptoms of high blood pressure - develop an action plan for high blood pressure - keep all doctor appointments - take medications for blood pressure exactly as prescribed - report new symptoms to your doctor - eat more whole grains, fruits and vegetables, lean meats and healthy fats - call for medicine refill 2 or 3 days before it runs out - take all medications exactly as prescribed - call doctor with any symptoms you believe are related to your medicine - call doctor when you experience any new symptoms - go to all doctor appointments as scheduled - adhere to prescribed diet: heart healthy/ADA diet       Our next appointment is by telephone on 03-02-2022 at 0900  Please call the care guide team at [phone] if you need to cancel or reschedule your appointment.   If you are experiencing a Mental Health or [place] or need someone to talk to, please call the Suicide and Crisis Lifeline: 988 call the  [place] National Suicide Prevention Lifeline: 682-781-0156 or TTY: (952) 396-8124 TTY 470-548-7426) to talk to a trained counselor call 1-800-273-TALK (toll free, 24 hour hotline)   The patient verbalized understanding of instructions, educational materials, and care plan provided today and declined offer to receive copy of patient instructions, educational materials, and care plan.   [name] RN, MSN, [facility] Mobile: [phone]

## 2022-01-05 NOTE — Chronic Care Management (AMB) (Signed)
Chronic Care Management   CCM RN Visit Note  01/05/2022 Name: SHAQUOIA MIERS MRN: [id number] DOB: [birth year]  Subjective: Mary Webb is a 66 y.o. year old female who is a primary care patient of [name], NP. The care management team was consulted for assistance with disease management and care coordination needs.    Engaged with patient by telephone for follow up visit in response to provider referral for case management and/or care coordination services.   Consent to Services:  The patient was given information about Chronic Care Management services, agreed to services, and gave verbal consent prior to initiation of services.  Please see initial visit note for detailed documentation.   Patient agreed to services and verbal consent obtained.   Assessment: Review of patient past medical history, allergies, medications, health status, including review of consultants reports, laboratory and other test data, was performed as part of comprehensive evaluation and provision of chronic care management services.   SDOH (Social Determinants of Health) assessments and interventions performed:    CCM Care Plan  Allergies  Allergen Reactions   Elemental Sulfur Anaphylaxis   Levaquin [Levofloxacin] Other (See Comments)    Stroke   Tekturna [Aliskiren] Shortness Of Breath   Accupril [Quinapril Hcl]    Aspirin    Diovan [Valsartan]    Erythromycin    Iodine    Latex     Can't remember reaction    Lotensin [Benazepril]    Norvasc [Amlodipine]    Shellfish Allergy Swelling   Sulfa Antibiotics    Tetanus Toxoids    Tetracycline     Other reaction(s): Unknown   Tetracyclines & Related    Penicillins Rash    Outpatient Encounter Medications as of 01/05/2022  Medication Sig   Accu-Chek Softclix Lancets lancets Use to check blood sugar 3 times a day and document for visits. Goal is <130 fasting and <180 two hours after meal.,   Ascorbic Acid (VITAMIN C) 1000 MG tablet  Take by mouth. Every other day   Blood Glucose Monitoring Suppl (ACCU-CHEK AVIVA PLUS) w/Device KIT Use to check blood sugar 3 times a day and document for visits.  Goal is <130 fasting and <180 two hours after meal.   chlorthalidone (HYGROTON) 25 MG tablet Take 1 tablet (25 mg total) by mouth daily.   Cholecalciferol (VITAMIN D3) 1000 units CAPS Take by mouth. Taking once a week   clopidogrel (PLAVIX) 75 MG tablet Take 1 tablet (75 mg total) by mouth daily.   Cyanocobalamin (VITAMIN B 12) 500 MCG TABS Take by mouth daily.   diclofenac Sodium (VOLTAREN) 1 % GEL Apply 2 g topically 4 (four) times daily.   empagliflozin (JARDIANCE) 10 MG TABS tablet Take 1 tablet (10 mg total) by mouth daily before breakfast.   fluticasone furoate-vilanterol (BREO ELLIPTA) 100-25 MCG/INH AEPB Inhale 1 puff into the lungs daily.   glucose blood (ACCU-CHEK AVIVA PLUS) test strip Use to check blood sugar 3 times a day and document for visits. Goal is <130 fasting and <180 two hours after meal.,   losartan (COZAAR) 100 MG tablet Take 1 tablet (100 mg total) by mouth daily.   potassium chloride SA (KLOR-CON) 20 MEQ tablet Take 1 tablet (20 mEq total) by mouth 2 (two) times daily.   pyridOXINE (VITAMIN B-6) 25 MG tablet Take by mouth daily.    No facility-administered encounter medications on file as of 01/05/2022.    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   Pituitary adenoma ([place]) 04/28/2015   History of CVA (cerebrovascular accident) 04/28/2015   Adult body mass index 60.0-69.9 ([place]) 04/28/2015   Hyperlipidemia 04/28/2015   Stroke ([place]) 04/28/2015    Conditions to be addressed/monitored:HTN, HLD, CKD Stage 3, and Pre- DM  Care Plan : RNCM; General Plan of Care  (Adult) for Chronic Disease Management and Care Coordination Needs  Updates made by [name], RN since 01/05/2022 12:00 AM     Problem: RNCM: Development of Plan of Care for Chronic Disease Management and Care coordination Needs( HTN, HLD, CKD3, Pre-DM2)   Priority: High     Long-Range Goal: RNCM: Effective management  of Plan of Care for Chronic Disease Management and Care coordination Needs( HTN, HLD, CKD3, Pre-DM2)   Start Date: 09/08/2021  Expected End Date: 09/08/2022  Priority: High  Note:   Current Barriers:  Knowledge Deficits related to plan of care for management of HTN, HLD, DMII, and CKD Stage 3  Chronic Disease Management support and education needs related to HTN, HLD, DMII, and CKD Stage 3  RNCM Clinical Goal(s):  Patient will verbalize understanding of plan for management of HTN, HLD, DMII, and CKD Stage 3  verbalize basic understanding of HTN, HLD, DMII, and CKD Stage 3 disease process and self health management plan  take all medications exactly as prescribed and will call provider for medication related questions demonstrate understanding of rationale for each prescribed medication  attend all scheduled medical appointments:01-21-2022 demonstrate improved and ongoing adherence to prescribed treatment plan for HTN, HLD, DMII, and CKD Stage 3 as evidenced by daily monitoring and recording of CBG  adherence to ADA/ carb modified diet adherence to prescribed medication regimen contacting provider for new or worsened symptoms or questions  demonstrate improved and ongoing health management independence  demonstrate a decrease in HTN, HLD, DMII, and CKD Stage 3 exacerbations  demonstrate ongoing self health care management ability effective management of chronic conditions through collaboration with RN Care manager, provider, and care team.   Interventions: 1:1 collaboration with primary care provider regarding development and update of comprehensive plan of care as  evidenced by provider attestation and co-signature Inter-disciplinary care team collaboration (see longitudinal plan of care) Evaluation of current treatment plan related to  self management and patient's adherence to plan as established by provider   SDOH Barriers (Status: Goal on track: YES.) Long Term Goal (>30 days) Patient interviewed and SDOH assessment performed        SDOH Interventions    Flowsheet Row Most Recent Value  SDOH Interventions   Food Insecurity Interventions Intervention Not Indicated  Financial Strain Interventions Intervention Not Indicated  Housing Interventions Intervention Not Indicated  Intimate Partner Violence Interventions Intervention Not Indicated  Physical Activity Interventions Intervention Not Indicated  Stress Interventions Intervention Not Indicated  Social Connections Interventions Intervention Not Indicated  Transportation Interventions Intervention Not Indicated     Patient interviewed and appropriate assessments performed Provided patient with information about resources available for the patient in [facility] and care guides available to address needs as they arise related to SDOH Discussed plans with patient for ongoing care management follow up and provided patient with direct contact information for care management team Advised patient to call the office for changes in [place], questions or concerns    Chronic Kidney Disease (  Status: Goal on track: YES.)  Last practice recorded BP readings:  BP Readings from Last 3 Encounters:  07/12/21 120/78  12/29/20 130/76  11/16/20 135/61  Most recent eGFR/CrCl:  Lab Results  Component Value Date   EGFR 48 (L) 07/12/2021    No components found for: CRCL  Assessed the patient     understanding of chronic kidney disease    Evaluation of current treatment plan related to chronic kidney disease self management and patient's adherence to plan as established by provider. 09-08-2021: The patient  has started taking Jardiance, education and support given. The patient states she has been peeing a lot but the pharmacist told her that should subside. The patient states the cost is a little high but she is managing. Knows to call the CCM team for changes or needs related to CKD and effective management.   11-10-2021: The patient is doing well and denies any changes in her kidney health. She states that she had her pacemaker placed recently and is not having any issues. Will continue to monitor. 01-05-2022: The patient is doing well and denies any issues related to kidney function. Discussed dietary restrictions and other factors that impact her kidney health. The patient is compliant with medications and plan of care.  Provided education to patient re: stroke prevention, s/s of heart attack and stroke    Reviewed prescribed diet heart healthy/ADA diet. 01-05-2022: Is compliant with heart healthy/ADA diet  Reviewed medications with patient and discussed importance of compliance. 09-08-2021: Has been taking Jardiance since the end of August.   01-05-2022: Is compliant with medications  Advised patient, providing education and rationale, to monitor blood pressure daily and record, calling PCP for findings outside established parameters    Discussed complications of poorly controlled blood pressure such as heart disease, stroke, circulatory complications, vision complications, kidney impairment, sexual dysfunction    Reviewed scheduled/upcoming provider appointments including:01-21-2022   Advised patient to discuss treatment options and recommendations for controlling progression of CKD with provider    Discussed plans with patient for ongoing care management follow up and provided patient with direct contact information for care management team    Screening for signs and symptoms of depression related to chronic disease state      Discussed the impact of chronic kidney disease on daily life and mental health  and acknowledged and normalized feelings of disempowerment, fear, and frustration    Assessed social determinant of health barriers    Provided education on kidney disease progression    Engage patient in early, proactive and ongoing discussion about goals of care and what matters most to them    Support coping and stress management by recognizing current strategies and assist in developing new strategies such as mindfulness, journaling, relaxation techniques, problem-solving      Diabetes:  (Status: Goal on track: YES.) Lab Results  Component Value Date   HGBA1C 6.5 (H) 07/12/2021  Assessed patient's understanding of A1c goal: <7% Provided education to patient about basic DM disease process; Reviewed medications with patient and discussed importance of medication adherence. 09-08-2021: The patient is taking Jardiance. Is "peeing a lot", but denies any acute distress. States cost is a little high but she is managing. 01-05-2022: States compliance with medications and blood sugar range is good. ;        Reviewed prescribed diet with patient heart healthy/ADA diet. 01-05-2022: The patient does a lot of cooking. Her husband is diabetic and she fixes a lot of vegetables and salads for  them.  She is mindful of watching sodium, fats, and sugars. Likes herbs and cooking with organic products; Counseled on importance of regular laboratory monitoring as prescribed;        Discussed plans with patient for ongoing care management follow up and provided patient with direct contact information for care management team;      Provided patient with written educational materials related to hypo and hyperglycemia and importance of correct treatment. 09-08-2021: Review of sx and sx. The patient has been to an education class with her husband and is aware of factors that cause blood sugars to change. States her blood sugars have been in 120's. 01-05-2022: The patient is very mindful of her blood sugars. States the lowest  she has seen is 78 and the highest 180. Will continue to monitor.       Reviewed scheduled/upcoming provider appointments including: 01-21-2022         Advised patient, providing education and rationale, to check cbg as directed and record.  09-08-2021: Review of blood sugars fasting <130 and post prandial of <180. States her range has been in 120's. She is mindful of dietary restrictions as her husband is diabetic also. Will continue to monitor.    01-05-2022: The patient has a range of blood sugars 78 to 180. Knows goals call provider for findings outside established parameters;       Review of patient status, including review of consultants reports, relevant laboratory and other test results, and medications completed;     11-10-2021: Ask about diabetic shoes. The patient has a follow up appointment with pcp in February. The patient advised to discuss with the pcp about a script for diabetic shoes. She does check her feet daily for cuts and sores. Education and support given.     Hyperlipidemia:  (Status: Goal on track: YES.) Lab Results  Component Value Date   CHOL 157 07/12/2021   HDL 49 07/12/2021   LDLCALC 92 07/12/2021   TRIG 86 07/12/2021   CHOLHDL 3.2 07/12/2021     Medication review performed; medication list updated in electronic medical record. 01-05-2022: The patient had a [facility] of [facility] and recommendations given for statin. The patient is currently not taking a statin. She sees the cardiologist in April and will likely discuss then. Also sees the pcp in March. Education and support given.  Provider established cholesterol goals reviewed. 01-05-2022: The patient is at goal. The patient states that she is doing well; Counseled on importance of regular laboratory monitoring as prescribed. 01-05-2022: Review of labs and the patient is currently at goal; Provided HLD educational materials; Reviewed role and benefits of statin for ASCVD risk reduction; Discussed  strategies to manage statin-induced myalgias; Reviewed importance of limiting foods high in cholesterol. 01-05-2022: Review of foods high in cholesterol. The patient does a good job of watching what she eats and maintaining her health and well being;  Hypertension: (Status: Goal on track: YES.) Last practice recorded BP readings:  BP Readings from Last 3 Encounters:  07/12/21 120/78  12/29/20 130/76  11/16/20 135/61  Most recent eGFR/CrCl:  Lab Results  Component Value Date   EGFR 48 (L) 07/12/2021    No components found for: CRCL  Evaluation of current treatment plan related to hypertension self management and patient's adherence to plan as established by provider. 11-10-2021: Denies any issues with HTN or heart health. Had her pacemaker placed and is doing well. No issues related to her pacemaker. She is mindful of factors  that impact her health and well being. Will continue to monitor for changes or new needs. 01-05-2022: The patient is doing well and denies any new concerns related to HTN or heart health. Will see her cardiologist for follow up in April;   Provided education to patient re: stroke prevention, s/s of heart attack and stroke; Reviewed prescribed diet heart healthy/ADA Reviewed medications with patient and discussed importance of compliance. 01-05-2022: Is compliant with medications. ;  Discussed plans with patient for ongoing care management follow up and provided patient with direct contact information for care management team; Advised patient, providing education and rationale, to monitor blood pressure daily and record, calling PCP for findings outside established parameters;  Reviewed scheduled/upcoming provider appointments including:  Provided education on prescribed diet Heart healthy/ADA diet ;  Discussed complications of poorly controlled blood pressure such as heart disease, stroke, circulatory complications, vision complications, kidney impairment, sexual dysfunction;   Screening for signs and symptoms of depression related to chronic disease state;  Assessed social determinant of health barriers;   Patient Goals/Self-Care Activities: Patient will self administer medications as prescribed as evidenced by self report/primary caregiver report  Patient will attend all scheduled provider appointments as evidenced by clinician review of documented attendance to scheduled appointments and patient/caregiver report Patient will call pharmacy for medication refills as evidenced by patient report and review of pharmacy fill history as appropriate Patient will attend church or other social activities as evidenced by patient report Patient will continue to perform ADL's independently as evidenced by patient/caregiver report Patient will continue to perform IADL's independently as evidenced by patient/caregiver report Patient will call provider office for new concerns or questions as evidenced by review of documented incoming telephone call notes and patient report Patient will work with BSW to address care coordination needs and will continue to work with the clinical team to address health care and disease management related needs as evidenced by documented adherence to scheduled care management/care coordination appointments - schedule appointment with eye doctor - check blood sugar at prescribed times: twice daily - check feet daily for cuts, sores or redness - enter blood sugar readings and medication or insulin into daily log - take the blood sugar log to all doctor visits - trim toenails straight across - drink 6 to 8 glasses of water each day - eat fish at least once per week - fill half of plate with vegetables - limit fast food meals to no more than 1 per week - manage portion size - prepare main meal at home 3 to 5 days each week - read food labels for fat, fiber, carbohydrates and portion size - reduce red meat to 2 to 3 times a week - set a realistic  goal - keep feet up while sitting - wash and dry feet carefully every day - wear comfortable, cotton socks - wear comfortable, well-fitting shoes - check blood pressure 3 times per week - choose a place to take my blood pressure (home, clinic or office, retail store) - write blood pressure results in a log or diary - learn about high blood pressure - keep a blood pressure log - take blood pressure log to all doctor appointments - call doctor for signs and symptoms of high blood pressure - develop an action plan for high blood pressure - keep all doctor appointments - take medications for blood pressure exactly as prescribed - report new symptoms to your doctor - eat more whole grains, fruits and vegetables, lean meats and healthy  fats - call for medicine refill 2 or 3 days before it runs out - take all medications exactly as prescribed - call doctor with any symptoms you believe are related to your medicine - call doctor when you experience any new symptoms - go to all doctor appointments as scheduled - adhere to prescribed diet: heart healthy/ADA diet        Plan:Telephone follow up appointment with care management team member scheduled for:  03-02-2022 at 0900 am  [place], MSN, [facility] Mobile: [phone]

## 2022-01-06 NOTE — Telephone Encounter (Signed)
Requested Prescriptions  Pending Prescriptions Disp Refills   JARDIANCE 10 MG TABS tablet [Pharmacy Med Name: JARDIANCE 10 MG TABLET] 30 tablet 0    Sig: Take 1 tablet (10 mg total) by mouth daily before breakfast.     Endocrinology:  Diabetes - SGLT2 Inhibitors Failed - 01/05/2022  1:32 PM      Failed - Cr in normal range and within 360 days    Creatinine  Date Value Ref Range Status  07/12/2014 1.37 (H) 0.60 - 1.30 mg/dL Final   Creatinine, Ser  Date Value Ref Range Status  07/12/2021 1.25 (H) 0.57 - 1.00 mg/dL Final         Failed - eGFR in normal range and within 360 days    EGFR (African American)  Date Value Ref Range Status  07/12/2014 49 (L)  Final   GFR calc Af Amer  Date Value Ref Range Status  12/29/2020 59 (L) >59 mL/min/1.73 Final    Comment:    **In accordance with recommendations from the NKF-ASN Task force,**   [facility] is in the process of updating its eGFR calculation to the   2021 CKD-EPI creatinine equation that estimates kidney function   without a race variable.    EGFR (Non-African Amer.)  Date Value Ref Range Status  07/12/2014 42 (L)  Final    Comment:    eGFR values <94m/min/1.73 m2 may be an indication of chronic kidney disease (CKD). Calculated eGFR is useful in patients with stable renal function. The eGFR calculation will not be reliable in acutely ill patients when serum creatinine is changing rapidly. It is not useful in  patients on dialysis. The eGFR calculation may not be applicable to patients at the low and high extremes of body sizes, pregnant women, and vegetarians.    GFR calc non Af Amer  Date Value Ref Range Status  12/29/2020 51 (L) >59 mL/min/1.73 Final   eGFR  Date Value Ref Range Status  07/12/2021 48 (L) >59 mL/min/1.73 Final         Passed - HBA1C is between 0 and 7.9 and within 180 days    Hgb A1c MFr Bld  Date Value Ref Range Status  07/12/2021 6.5 (H) 4.8 - 5.6 % Final    Comment:             Prediabetes:  5.7 - 6.4          Diabetes: >6.4          Glycemic control for adults with diabetes: <7.0          Passed - Valid encounter within last 6 months    Recent Outpatient Visits          5 months ago [facility]pertensive kidney disease with stage 3b [facility] kidney disease [place]   1 year ago [facility]   1 year ago Hypertensive kidney disease with stage 3 chronic kidney disease, unspecified whether stage 3a or 3b CKD   [facility]   2 years ago Hypertensive kidney disease with stage 3 chronic kidney disease, unspecified whether stage 3a or 3b CKD   [facility]  2 years ago Erroneous encounter - disregard   [facility]     Future Appointments            In 2 weeks  [name], NP [place], [place]   In 1 month  [facility]

## 2022-01-11 ENCOUNTER — Encounter: Payer: Medicare Other | Admitting: Nurse Practitioner

## 2022-01-11 DIAGNOSIS — E7800 Pure hypercholesterolemia, unspecified: Secondary | ICD-10-CM

## 2022-01-11 DIAGNOSIS — N1832 Chronic kidney disease, stage 3b: Secondary | ICD-10-CM

## 2022-01-11 DIAGNOSIS — I10 Essential (primary) hypertension: Secondary | ICD-10-CM

## 2022-01-11 DIAGNOSIS — I129 Hypertensive chronic kidney disease with stage 1 through stage 4 chronic kidney disease, or unspecified chronic kidney disease: Secondary | ICD-10-CM

## 2022-01-13 ENCOUNTER — Other Ambulatory Visit: Payer: Self-pay | Admitting: Nurse Practitioner

## 2022-01-13 DIAGNOSIS — I10 Essential (primary) hypertension: Secondary | ICD-10-CM

## 2022-01-13 DIAGNOSIS — N1832 Chronic kidney disease, stage 3b: Secondary | ICD-10-CM

## 2022-01-13 DIAGNOSIS — I495 Sick sinus syndrome: Secondary | ICD-10-CM

## 2022-01-13 DIAGNOSIS — I6322 Cerebral infarction due to unspecified occlusion or stenosis of basilar arteries: Secondary | ICD-10-CM

## 2022-01-13 NOTE — Telephone Encounter (Signed)
Requested medication (s) are due for refill today: yes  both meds ? ?Requested medication (s) are on the active medication list: yes both   ? ?Last refill: 07/20/21  #90 1 refill both meds ? ?Future visit scheduled yes  01/21/22 ? ?Notes to clinic:failed due to labs, please review. Pt has appt 01/21/22. ? ?Requested Prescriptions  ?Pending Prescriptions Disp Refills  ? chlorthalidone (HYGROTON) 25 MG tablet [Pharmacy Med Name: CHLORTHALIDONE 25 MG TABLET] 90 tablet 0  ?  Sig: Take 1 tablet (25 mg total) by mouth daily.  ?  ? Cardiovascular: Diuretics - Thiazide Failed - 01/13/2022 12:58 PM  ?  ?  Failed - Cr in normal range and within 180 days  ?  Creatinine  ?Date Value Ref Range Status  ?07/12/2014 1.37 (H) 0.60 - 1.30 mg/dL Final  ? ?Creatinine, Ser  ?Date Value Ref Range Status  ?07/12/2021 1.25 (H) 0.57 - 1.00 mg/dL Final  ?  ?  ?  ?  Failed - K in normal range and within 180 days  ?  Potassium  ?Date Value Ref Range Status  ?07/12/2021 3.8 3.5 - 5.2 mmol/L Final  ?07/12/2014 3.4 (L) 3.5 - 5.1 mmol/L Final  ?  ?  ?  ?  Failed - Na in normal range and within 180 days  ?  Sodium  ?Date Value Ref Range Status  ?07/12/2021 138 134 - 144 mmol/L Final  ?07/12/2014 137 136 - 145 mmol/L Final  ?  ?  ?  ?  Failed - Valid encounter within last 6 months  ?  Recent Outpatient Visits   ? ?      ? 6 months ago Hypertensive kidney disease with stage 3b chronic kidney disease ([place])  ? [place], NP  ? 1 year ago Annual physical exam  ? [facility] [name], NP  ? 1 year ago Hypertensive kidney disease with stage 3 chronic kidney disease, unspecified whether stage 3a or 3b CKD  ? [place], [place], [place]  ? 2 years ago Hypertensive kidney disease with stage 3 chronic kidney disease, unspecified whether stage 3a or 3b CKD  ? [place], [place]  ? 2 years ago Erroneous encounter - disregard  ? [place], [place], [place]  ? ?  ?  ?Future Appointments   ? ?        ? In 1 week [name], NP [facility]  ? In 1 month  [place], [facility]  ? ?  ? ?  ?  ?  Passed - Last BP in normal range  ?  BP Readings from Last 1 Encounters:  ?07/12/21 120/78  ?  ?  ?  ?  ? clopidogrel (PLAVIX) 75 MG tablet [Pharmacy Med Name: CLOPIDOGREL 75 MG TABLET] 90 tablet 0  ?  Sig: Take 1 tablet (75 mg total) by mouth daily.  ?  ? Hematology: Antiplatelets - clopidogrel Failed - 01/13/2022 12:58 PM  ?  ?  Failed - HCT in normal range and within 180 days  ?  Hematocrit  ?Date Value Ref Range Status  ?12/29/2020 42.7 34.0 - 46.6 % Final  ?  ?  ?  ?  Failed - HGB in normal range and within 180 days  ?  Hemoglobin  ?Date Value Ref Range Status  ?12/29/2020 13.8 11.1 - 15.9 g/dL Final  ?  ?  ?  ?  Failed - PLT in normal range and within 180 days  ?  Platelets  ?Date Value Ref Range Status  ?12/29/2020 310 150 - 450 x10E3/uL Final  ?  ?  ?  ?  Failed - Cr in normal range and within 360 days  ?  Creatinine  ?Date Value Ref Range Status  ?07/12/2014 1.37 (H) 0.60 - 1.30 mg/dL Final  ? ?Creatinine, Ser  ?Date Value Ref Range Status  ?07/12/2021 1.25 (H) 0.57 - 1.00 mg/dL Final  ?  ?  ?  ?  Failed - Valid encounter within last 6 months  ?  Recent Outpatient Visits   ? ?      ? 6 months ago Hypertensive kidney disease with stage 3b chronic kidney disease ([place])  ? [place], NP  ? 1 year ago Annual physical exam  ? [facility] [name], NP  ? 1 year ago Hypertensive kidney disease with stage 3 chronic kidney disease, unspecified whether stage 3a or 3b CKD  ? [place], [place], [place]  ? 2 years ago Hypertensive kidney disease with stage 3 chronic kidney disease, unspecified whether stage 3a or 3b CKD  ? [place], [place]  ? 2 years ago Erroneous encounter - disregard  ? [place], [place], [place]  ? ?  ?  ?Future Appointments   ? ?        ? In 1 week [name], NP [facility], [facility]  ? In 1 month  [place], [facility]  ? ?  ? ?  ?  ?  ? ? ? ? ?

## 2022-01-14 ENCOUNTER — Ambulatory Visit: Payer: Medicare Other | Admitting: Nurse Practitioner

## 2022-01-21 ENCOUNTER — Other Ambulatory Visit: Payer: Self-pay

## 2022-01-21 ENCOUNTER — Ambulatory Visit (INDEPENDENT_AMBULATORY_CARE_PROVIDER_SITE_OTHER): Payer: Medicare Other | Admitting: Nurse Practitioner

## 2022-01-21 ENCOUNTER — Encounter: Payer: Self-pay | Admitting: Nurse Practitioner

## 2022-01-21 VITALS — BP 138/90 | HR 69 | Temp 97.8°F | Ht 62.0 in | Wt 356.2 lb

## 2022-01-21 DIAGNOSIS — I129 Hypertensive chronic kidney disease with stage 1 through stage 4 chronic kidney disease, or unspecified chronic kidney disease: Secondary | ICD-10-CM

## 2022-01-21 DIAGNOSIS — Z13820 Encounter for screening for osteoporosis: Secondary | ICD-10-CM | POA: Diagnosis not present

## 2022-01-21 DIAGNOSIS — N1832 Chronic kidney disease, stage 3b: Secondary | ICD-10-CM

## 2022-01-21 DIAGNOSIS — D352 Benign neoplasm of pituitary gland: Secondary | ICD-10-CM | POA: Diagnosis not present

## 2022-01-21 DIAGNOSIS — Z0000 Encounter for general adult medical examination without abnormal findings: Secondary | ICD-10-CM | POA: Diagnosis not present

## 2022-01-21 DIAGNOSIS — Z1231 Encounter for screening mammogram for malignant neoplasm of breast: Secondary | ICD-10-CM

## 2022-01-21 DIAGNOSIS — I495 Sick sinus syndrome: Secondary | ICD-10-CM

## 2022-01-21 DIAGNOSIS — I10 Essential (primary) hypertension: Secondary | ICD-10-CM

## 2022-01-21 DIAGNOSIS — E7800 Pure hypercholesterolemia, unspecified: Secondary | ICD-10-CM | POA: Diagnosis not present

## 2022-01-21 DIAGNOSIS — R7303 Prediabetes: Secondary | ICD-10-CM | POA: Diagnosis not present

## 2022-01-21 LAB — URINALYSIS, ROUTINE W REFLEX MICROSCOPIC
Bilirubin, UA: NEGATIVE
Ketones, UA: NEGATIVE
Leukocytes,UA: NEGATIVE
Nitrite, UA: NEGATIVE
Protein,UA: NEGATIVE
RBC, UA: NEGATIVE
Specific Gravity, UA: 1.02 (ref 1.005–1.030)
Urobilinogen, Ur: 0.2 mg/dL (ref 0.2–1.0)
pH, UA: 5.5 (ref 5.0–7.5)

## 2022-01-21 MED ORDER — LOSARTAN POTASSIUM 100 MG PO TABS: 100.0000 mg | ORAL_TABLET | Freq: Every day | ORAL | 1 refills | Status: DC

## 2022-01-21 MED ORDER — POTASSIUM CHLORIDE CRYS ER 20 MEQ PO TBCR: 20.0000 meq | EXTENDED_RELEASE_TABLET | Freq: Two times a day (BID) | ORAL | 1 refills | Status: DC

## 2022-01-21 NOTE — Assessment & Plan Note (Signed)
No new neurologic issues. Continue plavix and lifestyle modifications.  Will continue to reassess at future visits. 

## 2022-01-21 NOTE — Progress Notes (Signed)
BP 138/90    Pulse 69    Temp 97.8 F (36.6 C) (Oral)    Ht _0  (1.575 m)    Wt (!) 356 lb 3.2 oz (161.6 kg)    SpO2 98%    BMI 65.15 kg/m    Subjective:    Patient ID: Mary Webb, female    DOB: [birth year], 66 y.o.   MRN: [id number]  HPI: Mary Webb is a 66 y.o. female presenting on 01/21/2022 for comprehensive medical examination. Current medical complaints include:none  She currently lives with: Menopausal Symptoms: no  Patient states she has been having some back pain and lower abdominal pain.  It has been going on this week.  Seems better today.  She took tylenol yesterday which helped a little bit with the pain.    HYPERTENSION / HYPERLIPIDEMIA Satisfied with current treatment? yes Duration of hypertension: years BP monitoring frequency: daily BP range: 130/75 BP medication side effects: no Past BP meds: chlorthalidone and losartan (cozaar) Duration of hyperlipidemia: years Cholesterol medication side effects: no Cholesterol supplements: none Past cholesterol medications: none Medication compliance: excellent compliance Aspirin: no Recent stressors: no Recurrent headaches: no Visual changes: no Palpitations: no Dyspnea: no Chest pain: no Lower extremity edema: no Dizzy/lightheaded: no  CHRONIC KIDNEY DISEASE CKD status: controlled Medications renally dose: yes Previous renal evaluation: no Pneumovax:  Up to Date Influenza Vaccine:  Not up to Date  Depression Screen done today and results listed below:  Depression screen [facility] 2/9 01/21/2022 09/08/2021 12/29/2020 11/16/2020 12/04/2019  Decreased Interest 0 0 0 0 0  Down, Depressed, Hopeless 0 0 0 0 0  PHQ - 2 Score 0 0 0 0 0  Altered sleeping 0 - 0 0 0  Tired, decreased energy 0 - 0 0 0  Change in appetite 0 - 0 0 0  Feeling bad or failure about yourself  0 - 0 0 0  Trouble concentrating 0 - 0 0 0  Moving slowly or fidgety/restless 0 - 0 0 0  Suicidal thoughts 0 - 0 0 0  PHQ-9 Score 0 - 0 0 0   Difficult doing work/chores Not difficult at all - - Not difficult at all -    The patient does not have a history of falls. I did complete a risk assessment for falls. A plan of care for falls was documented.   Past Medical History:  Past Medical History:  Diagnosis Date   Arthritis    CAD (coronary artery disease)    CKD (chronic kidney disease)    CVA (cerebral infarction)    L sided   Dysrhythmia    Hyperlipidemia    Lymphadenopathy    Pituitary adenoma (HCC)    Presence of permanent cardiac pacemaker    Recurrent depression (HCC)    Shortness of breath dyspnea    Sick sinus syndrome (HCC)    Stroke [facility])     Surgical History:  Past Surgical History:  Procedure Laterality Date   ABDOMINAL HYSTERECTOMY     COLONOSCOPY WITH PROPOFOL N/A 01/05/2016   Procedure: COLONOSCOPY WITH PROPOFOL;  Surgeon: Lucilla Lame, MD;  Location: [facility] ENDOSCOPY;  Service: Endoscopy;  Laterality: N/A;   PACEMAKER INSERTION  2015   Dr. Josefa Half    Medications:  Current Outpatient Medications on File Prior to Visit  Medication Sig   Accu-Chek Softclix Lancets lancets Use to check blood sugar 3 times a day and document for visits. Goal is <130 fasting and <180 two hours after meal.,  Ascorbic Acid (VITAMIN C) 1000 MG tablet Take by mouth. Every other day   Blood Glucose Monitoring Suppl (ACCU-CHEK AVIVA PLUS) w/Device KIT Use to check blood sugar 3 times a day and document for visits.  Goal is <130 fasting and <180 two hours after meal.   chlorthalidone (HYGROTON) 25 MG tablet Take 1 tablet (25 mg total) by mouth daily.   Cholecalciferol (VITAMIN D3) 1000 units CAPS Take by mouth. Taking once a week   clopidogrel (PLAVIX) 75 MG tablet Take 1 tablet (75 mg total) by mouth daily.   Cyanocobalamin (VITAMIN B 12) 500 MCG TABS Take by mouth daily.   fluticasone furoate-vilanterol (BREO ELLIPTA) 100-25 MCG/INH AEPB Inhale 1 puff into the lungs daily.   glucose blood (ACCU-CHEK AVIVA PLUS) test strip  Use to check blood sugar 3 times a day and document for visits. Goal is <130 fasting and <180 two hours after meal.,   JARDIANCE 10 MG TABS tablet Take 1 tablet (10 mg total) by mouth daily before breakfast.   pyridOXINE (VITAMIN B-6) 25 MG tablet Take by mouth daily.    diclofenac Sodium (VOLTAREN) 1 % GEL Apply 2 g topically 4 (four) times daily.   No current facility-administered medications on file prior to visit.    Allergies:  Allergies  Allergen Reactions   Elemental Sulfur Anaphylaxis   Levaquin [Levofloxacin] Other (See Comments)    Stroke   Tekturna [Aliskiren] Shortness Of Breath   Accupril [Quinapril Hcl]    Aspirin    Diovan [Valsartan]    Erythromycin    Iodine    Latex     Can't remember reaction    Lotensin [Benazepril]    Norvasc [Amlodipine]    Shellfish Allergy Swelling   Sulfa Antibiotics    Tetanus Toxoids    Tetracycline     Other reaction(s): Unknown   Tetracyclines & Related    Penicillins Rash    Social History:  Social History   Socioeconomic History   Marital status: Married    Spouse name: Not on file   Number of children: Not on file   Years of education: Not on file   Highest education level: Not on file  Occupational History   Not on file  Tobacco Use   Smoking status: Never   Smokeless tobacco: Never  Vaping Use   Vaping Use: Never used  Substance and Sexual Activity   Alcohol use: No   Drug use: No   Sexual activity: Not on file  Other Topics Concern   Not on file  Social History Narrative   Not on file   Social Determinants of Health   Financial Resource Strain: Low Risk    Difficulty of Paying Living Expenses: Not hard at all  Food Insecurity: No Food Insecurity   Worried About Charity fundraiser in the Last Year: Never true   Ran Out of Food in the Last Year: Never true  Transportation Needs: No Transportation Needs   Lack of Transportation (Medical): No   Lack of Transportation (Non-Medical): No  Physical  Activity: Insufficiently Active   Days of Exercise per Week: 7 days   Minutes of Exercise per Session: 10 min  Stress: No Stress Concern Present   Feeling of Stress : Not at all  Social Connections: Moderately Integrated   Frequency of Communication with Friends and Family: More than three times a week   Frequency of Social Gatherings with Friends and Family: More than three times a week   Attends  Religious Services: More than 4 times per year   Active Member of Clubs or Organizations: No   Attends Archivist Meetings: Never   Marital Status: Married  Human resources officer Violence: Not At Risk   Fear of Current or Ex-Partner: No   Emotionally Abused: No   Physically Abused: No   Sexually Abused: No   Social History   Tobacco Use  Smoking Status Never  Smokeless Tobacco Never   Social History   Substance and Sexual Activity  Alcohol Use No    Family History:  Family History  Problem Relation Age of Onset   Hypertension Mother    Hypertension Father    Diabetes Father        under control   Hypertension Maternal Grandmother    Depression Maternal Grandmother    Hypertension Maternal Grandfather    Hypertension Paternal Grandmother    Hypertension Paternal Grandfather    Thyroid disease Maternal Aunt     Past medical history, surgical history, medications, allergies, family history and social history reviewed with patient today and changes made to appropriate areas of the chart.   Review of Systems  Eyes:  Negative for blurred vision and double vision.  Respiratory:  Negative for shortness of breath.   Cardiovascular:  Negative for chest pain, palpitations and leg swelling.  Musculoskeletal:  Positive for back pain.  Neurological:  Negative for dizziness and headaches.  All other ROS negative except what is listed above and in the HPI.      Objective:    BP 138/90    Pulse 69    Temp 97.8 F (36.6 C) (Oral)    Ht _0  (1.575 m)    Wt (!) 356 lb 3.2 oz  (161.6 kg)    SpO2 98%    BMI 65.15 kg/m   Wt Readings from Last 3 Encounters:  01/21/22 (!) 356 lb 3.2 oz (161.6 kg)  07/12/21 (!) 371 lb (168.3 kg)  12/29/20 (!) 379 lb 12.8 oz (172.3 kg)    Physical Exam Vitals and nursing note reviewed.  Constitutional:      General: She is awake. She is not in acute distress.    Appearance: She is well-developed. She is obese. She is not ill-appearing.  HENT:     Head: Normocephalic and atraumatic.     Right Ear: Hearing, tympanic membrane, ear canal and external ear normal. No drainage.     Left Ear: Hearing, tympanic membrane, ear canal and external ear normal. No drainage.     Nose: Nose normal.     Right Sinus: No maxillary sinus tenderness or frontal sinus tenderness.     Left Sinus: No maxillary sinus tenderness or frontal sinus tenderness.     Mouth/Throat:     Mouth: Mucous membranes are moist.     Pharynx: Oropharynx is clear. Uvula midline. No pharyngeal swelling, oropharyngeal exudate or posterior oropharyngeal erythema.  Eyes:     General: Lids are normal.        Right eye: No discharge.        Left eye: No discharge.     Extraocular Movements: Extraocular movements intact.     Conjunctiva/sclera: Conjunctivae normal.     Pupils: Pupils are equal, round, and reactive to light.     Visual Fields: Right eye visual fields normal and left eye visual fields normal.  Neck:     Thyroid: No thyromegaly.     Vascular: No carotid bruit.     Trachea: Trachea normal.  Cardiovascular:     Rate and Rhythm: Normal rate and regular rhythm.     Heart sounds: Normal heart sounds. No murmur heard.   No gallop.  Pulmonary:     Effort: Pulmonary effort is normal. No accessory muscle usage or respiratory distress.     Breath sounds: Normal breath sounds.  Chest:  Breasts:    Right: Normal.     Left: Normal.  Abdominal:     General: Bowel sounds are normal. There is no distension.     Palpations: Abdomen is soft. There is no hepatomegaly or  splenomegaly.     Tenderness: There is no abdominal tenderness. There is no right CVA tenderness, left CVA tenderness or guarding.  Musculoskeletal:        General: Normal range of motion.     Cervical back: Normal range of motion and neck supple.     Right lower leg: No edema.     Left lower leg: No edema.  Lymphadenopathy:     Head:     Right side of head: No submental, submandibular, tonsillar, preauricular or posterior auricular adenopathy.     Left side of head: No submental, submandibular, tonsillar, preauricular or posterior auricular adenopathy.     Cervical: No cervical adenopathy.     Upper Body:     Right upper body: No supraclavicular, axillary or pectoral adenopathy.     Left upper body: No supraclavicular, axillary or pectoral adenopathy.  Skin:    General: Skin is warm and dry.     Capillary Refill: Capillary refill takes less than 2 seconds.     Findings: No rash.  Neurological:     Mental Status: She is alert and oriented to person, place, and time.     Gait: Gait is intact.     Deep Tendon Reflexes: Reflexes are normal and symmetric.     Reflex Scores:      Brachioradialis reflexes are 2+ on the right side and 2+ on the left side.      Patellar reflexes are 2+ on the right side and 2+ on the left side. Psychiatric:        Attention and Perception: Attention normal.        Mood and Affect: Mood normal.        Speech: Speech normal.        Behavior: Behavior normal. Behavior is cooperative.        Thought Content: Thought content normal.        Judgment: Judgment normal.    Results for orders placed or performed in visit on 01/21/22  Urinalysis, Routine w reflex microscopic  Result Value Ref Range   Specific Gravity, UA 1.020 1.005 - 1.030   pH, UA 5.5 5.0 - 7.5   Color, UA Yellow Yellow   Appearance Ur Clear Clear   Leukocytes,UA Negative Negative   Protein,UA Negative Negative/Trace   Glucose, UA 3+ (A) Negative   Ketones, UA Negative Negative   RBC, UA  Negative Negative   Bilirubin, UA Negative Negative   Urobilinogen, Ur 0.2 0.2 - 1.0 mg/dL   Nitrite, UA Negative Negative      Assessment & Plan:   Problem List Items Addressed This Visit       Cardiovascular and Mediastinum   Sick sinus syndrome (HCC) (Chronic)    Chronic.  Controlled.  Followed by Cardiology. Continue to follow their recommendations.  Continue with current medication regimen.  Labs ordered today.  Return to clinic in 6 months for  reevaluation.  Call sooner if concerns arise.        Relevant Medications   losartan (COZAAR) 100 MG tablet   Hypertension    Chronic.  Controlled.  Continue with current medication regimen on Losartan 181m daily and Chlorthalidone 221mdaily.  Refills sent today.  Labs ordered today.  Return to clinic in 6 months for reevaluation.  Call sooner if concerns arise.        Relevant Medications   losartan (COZAAR) 100 MG tablet     Endocrine   Pituitary adenoma (HCC) (Chronic)    No new neurologic issues. Continue plavix and lifestyle modifications.  Will continue to reassess at future visits.        Genitourinary   Hypertensive kidney disease with CKD stage III (HCC)    Chronic.  Controlled.  Continue with current medication regimen.  Labs ordered today.  Return to clinic in 6 months for reevaluation.  Call sooner if concerns arise.        Relevant Medications   losartan (COZAAR) 100 MG tablet     Other   Hyperlipidemia (Chronic)    Chronic.  Controlled.  Continue with current medication regimen.  Labs ordered today.  Refills sent today.  Return to clinic in 6 months for reevaluation.  Call sooner if concerns arise.        Relevant Medications   losartan (COZAAR) 100 MG tablet   Other Relevant Orders   Lipid panel   Morbid obesity ([place]   Recommend a healthy lifestyle through diet and exercise.       Prediabetes    Controlled with diet and Jardiance 104maily.  Will make recommendations based on lab results.         Relevant Orders   HgB A1c   Other Visit Diagnoses     Annual physical exam    -  Primary   Health maintenance reviewed during visit. Labs ordered. Up to date on vaccines.  Mammogram ordered. Colonoscopy up to date.    Relevant Orders   CBC with Differential/Platelet   Comprehensive metabolic panel   Lipid panel   TSH   Urinalysis, Routine w reflex microscopic (Completed)   Encounter for screening mammogram for malignant neoplasm of breast       Relevant Orders   MM Digital Screening   Screening for osteoporosis       Relevant Orders   DG Bone Density   Essential hypertension       Relevant Medications   losartan (COZAAR) 100 MG tablet        Follow up plan: Return in about 6 months (around 07/24/2022) for HTN, HLD, DM2 FU.   LABORATORY TESTING:  - Pap smear: not applicable  IMMUNIZATIONS:   - Tdap: Tetanus vaccination status reviewed: last tetanus booster within 10 years. - Influenza: Refused - Pneumovax: Up to date - Prevnar: Up to date - COVID: Up to date - HPV: Not applicable - Shingrix vaccine: Refused  SCREENING: -Mammogram: Ordered today  - Colonoscopy: Up to date  - Bone Density: Ordered today  -Hearing Test: Not applicable  -Spirometry: Not applicable   PATIENT COUNSELING:   Advised to take 1 mg of folate supplement per day if capable of pregnancy.   Sexuality: Discussed sexually transmitted diseases, partner selection, use of condoms, avoidance of unintended pregnancy  and contraceptive alternatives.   Advised to avoid cigarette smoking.  I discussed with the patient that most people either abstain from alcohol or drink within safe limits (<=  14/week and <=4 drinks/occasion for males, <=7/weeks and <= 3 drinks/occasion for females) and that the risk for alcohol disorders and other health effects rises proportionally with the number of drinks per week and how often a drinker exceeds daily limits.  Discussed cessation/primary prevention of drug use  and availability of treatment for abuse.   Diet: Encouraged to adjust caloric intake to maintain  or achieve ideal body weight, to reduce intake of dietary saturated fat and total fat, to limit sodium intake by avoiding high sodium foods and not adding table salt, and to maintain adequate dietary potassium and calcium preferably from fresh fruits, vegetables, and low-fat dairy products.    stressed the importance of regular exercise  Injury prevention: Discussed safety belts, safety helmets, smoke detector, smoking near bedding or upholstery.   Dental health: Discussed importance of regular tooth brushing, flossing, and dental visits.    NEXT PREVENTATIVE PHYSICAL DUE IN 1 YEAR. Return in about 6 months (around 07/24/2022) for HTN, HLD, DM2 FU.

## 2022-01-21 NOTE — Progress Notes (Deleted)
Ht '5\' 2"'  (1.575 m)    Wt (!) 356 lb 3.2 oz (161.6 kg)    BMI 65.15 kg/m    Subjective:    Patient ID: Mary Webb, female    DOB: 1956/05/15, 66 y.o.   MRN: [id number]  HPI: Mary Webb is a 66 y.o. female presenting on 01/21/2022 for comprehensive medical examination. Current medical complaints include:{Blank single:19197::"none","***"}  He currently lives with: Interim Problems from his last visit: {Blank single:19197::"yes","no"}  HYPERTENSION / HYPERLIPIDEMIA Satisfied with current treatment? {Blank single:19197::"yes","no"} Duration of hypertension: {Blank single:19197::"chronic","months","years"} BP monitoring frequency: {Blank single:19197::"not checking","rarely","daily","weekly","monthly","a few times a day","a few times a week","a few times a month"} BP range:  BP medication side effects: {Blank single:19197::"yes","no"} Past BP meds: {Blank BEEFEOFH:21975::"OITG","PQDIYMEBRA","XENMMHWKGS/UPJSRPRXYV","OPFYTWKM","QKMMNOTRRN","HAFBXUXYBF/XOVA","NVBTYOMAYO (bystolic)","carvedilol","chlorthalidone","clonidine","diltiazem","exforge HCT","HCTZ","irbesartan (avapro)","labetalol","lisinopril","lisinopril-HCTZ","losartan (cozaar)","methyldopa","nifedipine","olmesartan (benicar)","olmesartan-HCTZ","quinapril","ramipril","spironalactone","tekturna","valsartan","valsartan-HCTZ","verapamil"} Duration of hyperlipidemia: {Blank single:19197::"chronic","months","years"} Cholesterol medication side effects: {Blank single:19197::"yes","no"} Cholesterol supplements: {Blank multiple:19196::"none","fish oil","niacin","red yeast rice"} Past cholesterol medications: {Blank multiple:19196::"none","atorvastain (lipitor)","lovastatin (mevacor)","pravastatin (pravachol)","rosuvastatin (crestor)","simvastatin (zocor)","vytorin","fenofibrate (tricor)","gemfibrozil","ezetimide (zetia)","niaspan","lovaza"} Medication compliance: {Blank single:19197::"excellent compliance","good compliance","fair  compliance","poor compliance"} Aspirin: {Blank single:19197::"yes","no"} Recent stressors: {Blank single:19197::"yes","no"} Recurrent headaches: {Blank single:19197::"yes","no"} Visual changes: {Blank single:19197::"yes","no"} Palpitations: {Blank single:19197::"yes","no"} Dyspnea: {Blank single:19197::"yes","no"} Chest pain: {Blank single:19197::"yes","no"} Lower extremity edema: {Blank single:19197::"yes","no"} Dizzy/lightheaded: {Blank single:19197::"yes","no"}  DIABETES Hypoglycemic episodes:{Blank single:19197::"yes","no"} Polydipsia/polyuria: {Blank single:19197::"yes","no"} Visual disturbance: {Blank single:19197::"yes","no"} Chest pain: {Blank single:19197::"yes","no"} Paresthesias: {Blank single:19197::"yes","no"} Glucose Monitoring: {Blank single:19197::"yes","no"}  Accucheck frequency: {Blank single:19197::"Not Checking","Daily","BID","TID"}  Fasting glucose:  Post prandial:  Evening:  Before meals: Taking Insulin?: {Blank single:19197::"yes","no"}  Long acting insulin:  Short acting insulin: Blood Pressure Monitoring: {Blank single:19197::"not checking","rarely","daily","weekly","monthly","a few times a day","a few times a week","a few times a month"} Retinal Examination: {Blank single:19197::"Up to Date","Not up to Date"} Foot Exam: {Blank single:19197::"Up to Date","Not up to Date"} Diabetic Education: {Blank single:19197::"Completed","Not Completed"} Pneumovax: {Blank single:19197::"Up to Date","Not up to Date","unknown"} Influenza: {Blank single:19197::"Up to Date","Not up to Date","unknown"} Aspirin: {Blank single:19197::"yes","no"}  CHRONIC KIDNEY DISEASE CKD status: {Blank single:19197::"controlled","uncontrolled","better","worse","exacerbated","stable"} Medications renally dose: {Blank single:19197::"yes","no"} Previous renal evaluation: {Blank single:19197::"yes","no"} Pneumovax:  {Blank single:19197::"Up to Date","Not up to Date","unknown"} Influenza  Vaccine:  {Blank single:19197::"Up to Date","Not up to Date","unknown"}  Depression Screen done today and results listed below:  Depression screen [facility] 2/9 09/08/2021 12/29/2020 11/16/2020 12/04/2019 11/06/2019  Decreased Interest 0 0 0 0 0  Down, Depressed, Hopeless 0 0 0 0 0  PHQ - 2 Score 0 0 0 0 0  Altered sleeping - 0 0 0 -  Tired, decreased energy - 0 0 0 -  Change in appetite - 0 0 0 -  Feeling bad or failure about yourself  - 0 0 0 -  Trouble concentrating - 0 0 0 -  Moving slowly or fidgety/restless - 0 0 0 -  Suicidal thoughts - 0 0 0 -  PHQ-9 Score - 0 0 0 -  Difficult doing work/chores - - Not difficult at all - -    The patient {has/does not KHTX:77414} a history of falls. I {did/did not:19850} complete a risk assessment for falls. A plan of care for falls {was/was not:19852} documented.   Past Medical History:  Past Medical History:  Diagnosis Date   Arthritis    CAD (coronary artery disease)    CKD (chronic kidney disease)    CVA (cerebral infarction)    L sided   Dysrhythmia    Hyperlipidemia    Lymphadenopathy    Pituitary adenoma (HCC)    Presence of permanent cardiac pacemaker    Recurrent depression (HCC)    Shortness of breath dyspnea    Sick sinus  syndrome ([place])    Stroke [facility])     Surgical History:  Past Surgical History:  Procedure Laterality Date   ABDOMINAL HYSTERECTOMY     COLONOSCOPY WITH PROPOFOL N/A 01/05/2016   Procedure: COLONOSCOPY WITH PROPOFOL;  Surgeon: Lucilla Lame, MD;  Location: [facility] ENDOSCOPY;  Service: Endoscopy;  Laterality: N/A;   PACEMAKER INSERTION  2015   Dr. Josefa Half    Medications:  Current Outpatient Medications on File Prior to Visit  Medication Sig   Accu-Chek Softclix Lancets lancets Use to check blood sugar 3 times a day and document for visits. Goal is <130 fasting and <180 two hours after meal.,   Ascorbic Acid (VITAMIN C) 1000 MG tablet Take by mouth. Every other day   Blood Glucose Monitoring Suppl (ACCU-CHEK  AVIVA PLUS) w/Device KIT Use to check blood sugar 3 times a day and document for visits.  Goal is <130 fasting and <180 two hours after meal.   chlorthalidone (HYGROTON) 25 MG tablet Take 1 tablet (25 mg total) by mouth daily.   Cholecalciferol (VITAMIN D3) 1000 units CAPS Take by mouth. Taking once a week   clopidogrel (PLAVIX) 75 MG tablet Take 1 tablet (75 mg total) by mouth daily.   Cyanocobalamin (VITAMIN B 12) 500 MCG TABS Take by mouth daily.   diclofenac Sodium (VOLTAREN) 1 % GEL Apply 2 g topically 4 (four) times daily.   fluticasone furoate-vilanterol (BREO ELLIPTA) 100-25 MCG/INH AEPB Inhale 1 puff into the lungs daily.   glucose blood (ACCU-CHEK AVIVA PLUS) test strip Use to check blood sugar 3 times a day and document for visits. Goal is <130 fasting and <180 two hours after meal.,   JARDIANCE 10 MG TABS tablet Take 1 tablet (10 mg total) by mouth daily before breakfast.   losartan (COZAAR) 100 MG tablet Take 1 tablet (100 mg total) by mouth daily.   potassium chloride SA (KLOR-CON) 20 MEQ tablet Take 1 tablet (20 mEq total) by mouth 2 (two) times daily.   pyridOXINE (VITAMIN B-6) 25 MG tablet Take by mouth daily.    No current facility-administered medications on file prior to visit.    Allergies:  Allergies  Allergen Reactions   Elemental Sulfur Anaphylaxis   Levaquin [Levofloxacin] Other (See Comments)    Stroke   Tekturna [Aliskiren] Shortness Of Breath   Accupril [Quinapril Hcl]    Aspirin    Diovan [Valsartan]    Erythromycin    Iodine    Latex     Can't remember reaction    Lotensin [Benazepril]    Norvasc [Amlodipine]    Shellfish Allergy Swelling   Sulfa Antibiotics    Tetanus Toxoids    Tetracycline     Other reaction(s): Unknown   Tetracyclines & Related    Penicillins Rash    Social History:  Social History   Socioeconomic History   Marital status: Married    Spouse name: Not on file   Number of children: Not on file   Years of education: Not  on file   Highest education level: Not on file  Occupational History   Not on file  Tobacco Use   Smoking status: Never   Smokeless tobacco: Never  Vaping Use   Vaping Use: Never used  Substance and Sexual Activity   Alcohol use: No   Drug use: No   Sexual activity: Not on file  Other Topics Concern   Not on file  Social History Narrative   Not on file   Social Determinants of  Health   Financial Resource Strain: Low Risk    Difficulty of Paying Living Expenses: Not hard at all  Food Insecurity: No Food Insecurity   Worried About [place] in the Last Year: Never true   Ran Out of Food in the Last Year: Never true  Transportation Needs: No Transportation Needs   Lack of Transportation (Medical): No   Lack of Transportation (Non-Medical): No  Physical Activity: Insufficiently Active   Days of Exercise per Week: 7 days   Minutes of Exercise per Session: 10 min  Stress: No Stress Concern Present   Feeling of Stress : Not at all  Social Connections: Moderately Integrated   Frequency of Communication with Friends and Family: More than three times a week   Frequency of Social Gatherings with Friends and Family: More than three times a week   Attends Religious Services: More than 4 times per year   Active Member of [employer] or Organizations: No   Attends Music therapist: Never   Marital Status: Married  Human resources officer Violence: Not At Risk   Fear of Current or Ex-Partner: No   Emotionally Abused: No   Physically Abused: No   Sexually Abused: No   Social History   Tobacco Use  Smoking Status Never  Smokeless Tobacco Never   Social History   Substance and Sexual Activity  Alcohol Use No    Family History:  Family History  Problem Relation Age of Onset   Hypertension Mother    Hypertension Father    Diabetes Father        under control   Hypertension Maternal Grandmother    Depression Maternal Grandmother    Hypertension Maternal  Grandfather    Hypertension Paternal Grandmother    Hypertension Paternal Grandfather    Thyroid disease Maternal Aunt     Past medical history, surgical history, medications, allergies, family history and social history reviewed with patient today and changes made to appropriate areas of the chart.   ROS All other ROS negative except what is listed above and in the HPI.      Objective:    Ht '5\' 2"'  (1.575 m)    Wt (!) 356 lb 3.2 oz (161.6 kg)    BMI 65.15 kg/m   Wt Readings from Last 3 Encounters:  01/21/22 (!) 356 lb 3.2 oz (161.6 kg)  07/12/21 (!) 371 lb (168.3 kg)  12/29/20 (!) 379 lb 12.8 oz (172.3 kg)    Physical Exam  Results for orders placed or performed in visit on 07/14/21  Microalbumin, Urine Waived  Result Value Ref Range   Microalb, Ur Waived 80 (H) 0 - 19 mg/L   Creatinine, Urine Waived 300 10 - 300 mg/dL   Microalb/Creat Ratio <30 <30 mg/g      Assessment & Plan:   Problem List Items Addressed This Visit       Cardiovascular and Mediastinum   Sick sinus syndrome (HCC) (Chronic)   Hypertension     Endocrine   Pituitary adenoma ([place]) (Chronic)     Genitourinary   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 Labs ordered. Up to date on vaccines.  Mammogram ordered. Colonoscopy up to date.    Morbid obesity ([place])   (Chronic)  Encounter for screening mammogram for malignant neoplasm of breast       Screening for osteoporosis            Discussed aspirin prophylaxis for myocardial infarction prevention and decision was {Blank single:19197::"it was not indicated","made to continue ASA","made to start ASA","made to stop ASA","that we recommended ASA, and patient refused"}  LABORATORY TESTING:  Health maintenance labs ordered today as discussed above.   The natural history of  prostate cancer and ongoing controversy regarding screening and potential treatment outcomes of prostate cancer has been discussed with the patient. The meaning of a false positive PSA and a false negative PSA has been discussed. He indicates understanding of the limitations of this screening test and wishes *** to proceed with screening PSA testing.   IMMUNIZATIONS:   - Tdap: Tetanus vaccination status reviewed: {tetanus status:315746}. - Influenza: {Blank single:19197::"Up to date","Administered today","Postponed to flu season","Refused","Given elsewhere"} - Pneumovax: {Blank single:19197::"Up to date","Administered today","Not applicable","Refused","Given elsewhere"} - Prevnar: {Blank single:19197::"Up to date","Administered today","Not applicable","Refused","Given elsewhere"} - COVID: {Blank single:19197::"Up to date","Administered today","Not applicable","Refused","Given elsewhere"} - HPV: {Blank single:19197::"Up to date","Administered today","Not applicable","Refused","Given elsewhere"} - Shingrix vaccine: {Blank single:19197::"Up to date","Administered today","Not applicable","Refused","Given elsewhere"}  SCREENING: - Colonoscopy: {Blank single:19197::"Up to date","Ordered today","Not applicable","Refused","Done elsewhere"}  Discussed with patient purpose of the colonoscopy is to detect colon cancer at curable precancerous or early stages   - AAA Screening: {Blank single:19197::"Up to date","Ordered today","Not applicable","Refused","Done elsewhere"}  -Hearing Test: {Blank single:19197::"Up to date","Ordered today","Not applicable","Refused","Done elsewhere"}  -Spirometry: {Blank single:19197::"Up to date","Ordered today","Not applicable","Refused","Done elsewhere"}   PATIENT COUNSELING:    Sexuality: Discussed sexually transmitted diseases, partner selection, use of condoms, avoidance of unintended pregnancy  and contraceptive alternatives.   Advised to avoid cigarette smoking.  I  discussed with the patient that most people either abstain from alcohol or drink within safe limits (<=14/week and <=4 drinks/occasion for males, <=7/weeks and <= 3 drinks/occasion for females) and that the risk for alcohol disorders and other health effects rises proportionally with the number of drinks per week and how often a drinker exceeds daily limits.  Discussed cessation/primary prevention of drug use and availability of treatment for abuse.   Diet: Encouraged to adjust caloric intake to maintain  or achieve ideal body weight, to reduce intake of dietary saturated fat and total fat, to limit sodium intake by avoiding high sodium foods and not adding table salt, and to maintain adequate dietary potassium and calcium preferably from fresh fruits, vegetables, and low-fat dairy products.    stressed the importance of regular exercise  Injury prevention: Discussed safety belts, safety helmets, smoke detector, smoking near bedding or upholstery.   Dental health: Discussed importance of regular tooth brushing, flossing, and dental visits.   Follow up plan: NEXT PREVENTATIVE PHYSICAL DUE IN 1 YEAR. Return in about 6 months (around 07/24/2022) for HTN, HLD, DM2 FU.

## 2022-01-21 NOTE — Assessment & Plan Note (Addendum)
Controlled with diet and Jardiance '10mg'$  daily.  Will make recommendations based on lab results.   ?

## 2022-01-21 NOTE — Assessment & Plan Note (Signed)
Recommend a healthy lifestyle through diet and exercise.  °

## 2022-01-21 NOTE — Assessment & Plan Note (Signed)
Chronic.  Controlled.  Continue with current medication regimen on Losartan 100mg daily and Chlorthalidone 25mg daily.  Refills sent today.  Labs ordered today.  Return to clinic in 6 months for reevaluation.  Call sooner if concerns arise.   

## 2022-01-21 NOTE — Assessment & Plan Note (Signed)
Chronic.  Controlled.  Continue with current medication regimen.  Labs ordered today.  Return to clinic in 6 months for reevaluation.  Call sooner if concerns arise.  ? ?

## 2022-01-21 NOTE — Assessment & Plan Note (Signed)
Chronic.  Controlled.  Continue with current medication regimen.  Labs ordered today.  Refills sent today.  Return to clinic in 6 months for reevaluation.  Call sooner if concerns arise.   

## 2022-01-21 NOTE — Patient Instructions (Signed)
Please call to schedule your mammogram and/or bone density: °[facility]  °Address: [address], [place], [place] [address]  °Phone: [phone] ° °

## 2022-01-21 NOTE — Assessment & Plan Note (Signed)
Chronic.  Controlled.  Followed by Cardiology. Continue to follow their recommendations.  Continue with current medication regimen.  Labs ordered today.  Return to clinic in 6 months for reevaluation.  Call sooner if concerns arise.   

## 2022-01-22 LAB — CBC WITH DIFFERENTIAL/PLATELET
Basophils Absolute: 0.1 10*3/uL (ref 0.0–0.2)
Basos: 1 %
EOS (ABSOLUTE): 0.1 10*3/uL (ref 0.0–0.4)
Eos: 2 %
Hematocrit: 43.8 % (ref 34.0–46.6)
Hemoglobin: 14.9 g/dL (ref 11.1–15.9)
Immature Grans (Abs): 0 10*3/uL (ref 0.0–0.1)
Immature Granulocytes: 0 %
Lymphocytes Absolute: 1.8 10*3/uL (ref 0.7–3.1)
Lymphs: 27 %
MCH: 28.9 pg (ref 26.6–33.0)
MCHC: 34 g/dL (ref 31.5–35.7)
MCV: 85 fL (ref 79–97)
Monocytes Absolute: 0.7 10*3/uL (ref 0.1–0.9)
Monocytes: 10 %
Neutrophils Absolute: 4 10*3/uL (ref 1.4–7.0)
Neutrophils: 60 %
Platelets: 311 10*3/uL (ref 150–450)
RBC: 5.15 x10E6/uL (ref 3.77–5.28)
RDW: 13.7 % (ref 11.7–15.4)
WBC: 6.6 10*3/uL (ref 3.4–10.8)

## 2022-01-22 LAB — COMPREHENSIVE METABOLIC PANEL
ALT: 16 IU/L (ref 0–32)
AST: 17 IU/L (ref 0–40)
Albumin/Globulin Ratio: 1.2 (ref 1.2–2.2)
Albumin: 4.2 g/dL (ref 3.8–4.8)
Alkaline Phosphatase: 78 IU/L (ref 44–121)
BUN/Creatinine Ratio: 14 (ref 12–28)
BUN: 16 mg/dL (ref 8–27)
Bilirubin Total: 0.5 mg/dL (ref 0.0–1.2)
CO2: 24 mmol/L (ref 20–29)
Calcium: 9.8 mg/dL (ref 8.7–10.3)
Chloride: 97 mmol/L (ref 96–106)
Creatinine, Ser: 1.16 mg/dL — ABNORMAL HIGH (ref 0.57–1.00)
Globulin, Total: 3.5 g/dL (ref 1.5–4.5)
Glucose: 96 mg/dL (ref 70–99)
Potassium: 3.7 mmol/L (ref 3.5–5.2)
Sodium: 138 mmol/L (ref 134–144)
Total Protein: 7.7 g/dL (ref 6.0–8.5)
eGFR: 52 mL/min/{1.73_m2} — ABNORMAL LOW (ref >59)

## 2022-01-22 LAB — LIPID PANEL
Chol/HDL Ratio: 3.4 ratio (ref 0.0–4.4)
Cholesterol, Total: 172 mg/dL (ref 100–199)
HDL: 50 mg/dL (ref >39)
LDL Chol Calc (NIH): 102 mg/dL — ABNORMAL HIGH (ref 0–99)
Triglycerides: 112 mg/dL (ref 0–149)
VLDL Cholesterol Cal: 20 mg/dL (ref 5–40)

## 2022-01-22 LAB — HEMOGLOBIN A1C
Est. average glucose Bld gHb Est-mCnc: 137 mg/dL
Hgb A1c MFr Bld: 6.4 % — ABNORMAL HIGH (ref 4.8–5.6)

## 2022-01-22 LAB — TSH: TSH: 1.65 u[IU]/mL (ref 0.450–4.500)

## 2022-01-24 ENCOUNTER — Other Ambulatory Visit: Payer: Self-pay | Admitting: *Deleted

## 2022-01-24 ENCOUNTER — Ambulatory Visit (INDEPENDENT_AMBULATORY_CARE_PROVIDER_SITE_OTHER): Payer: Medicare Other | Admitting: *Deleted

## 2022-01-24 DIAGNOSIS — Z0000 Encounter for general adult medical examination without abnormal findings: Secondary | ICD-10-CM

## 2022-01-24 NOTE — Progress Notes (Signed)
Subjective:   Mary Webb is a 66 y.o. female who presents for Medicare Annual (Subsequent) preventive examination.  I connected with  [name] on 01/24/22 by a Telephone enabled telemedicine application and verified that I am speaking with the correct person using two identifiers.   I discussed the limitations of evaluation and management by telemedicine. The patient expressed understanding and agreed to proceed.  Patient location: home  Provider location: Tele-Health not in office   Review of Systems     Cardiac Risk Factors include: advanced age (>5mn, >>21women);diabetes mellitus;hypertension;obesity (BMI >30kg/m2);sedentary lifestyle;family history of premature cardiovascular disease     Objective:    Today's Vitals   01/24/22 1042  PainSc: 8    There is no height or weight on file to calculate BMI.  Advanced Directives 01/24/2022 11/16/2020 11/06/2019 11/01/2018 10/26/2017  Does Patient Have a Medical Advance Directive? _0   Would patient like information on creating a medical advance directive? No - Patient declined - Yes (MAU/Ambulatory/Procedural Areas - Information given) Yes (MAU/Ambulatory/Procedural Areas - Information given) Yes (MAU/Ambulatory/Procedural Areas - Information given)    Current Medications (verified) Outpatient Encounter Medications as of 01/24/2022  Medication Sig   Accu-Chek Softclix Lancets lancets Use to check blood sugar 3 times a day and document for visits. Goal is <130 fasting and <180 two hours after meal.,   Ascorbic Acid (VITAMIN C) 1000 MG tablet Take by mouth. Every other day   Blood Glucose Monitoring Suppl (ACCU-CHEK AVIVA PLUS) w/Device KIT Use to check blood sugar 3 times a day and document for visits.  Goal is <130 fasting and <180 two hours after meal.   chlorthalidone (HYGROTON) 25 MG tablet Take 1 tablet (25 mg total) by mouth daily.   Cholecalciferol (VITAMIN D3) 1000 units CAPS Take by mouth. Taking  once a week   clopidogrel (PLAVIX) 75 MG tablet Take 1 tablet (75 mg total) by mouth daily.   Cyanocobalamin (VITAMIN B 12) 500 MCG TABS Take by mouth daily.   diclofenac Sodium (VOLTAREN) 1 % GEL Apply 2 g topically 4 (four) times daily.   fluticasone furoate-vilanterol (BREO ELLIPTA) 100-25 MCG/INH AEPB Inhale 1 puff into the lungs daily.   glucose blood (ACCU-CHEK AVIVA PLUS) test strip Use to check blood sugar 3 times a day and document for visits. Goal is <130 fasting and <180 two hours after meal.,   JARDIANCE 10 MG TABS tablet Take 1 tablet (10 mg total) by mouth daily before breakfast.   losartan (COZAAR) 100 MG tablet Take 1 tablet (100 mg total) by mouth daily.   potassium chloride SA (KLOR-CON M) 20 MEQ tablet Take 1 tablet (20 mEq total) by mouth 2 (two) times daily.   pyridOXINE (VITAMIN B-6) 25 MG tablet Take by mouth daily.    No facility-administered encounter medications on file as of 01/24/2022.    Allergies (verified) Elemental sulfur, Levaquin [levofloxacin], Tekturna [aliskiren], Accupril [quinapril hcl], Aspirin, Diovan [valsartan], Erythromycin, Iodine, Latex, Lotensin [benazepril], Norvasc [amlodipine], Shellfish allergy, Sulfa antibiotics, Tetanus toxoids, Tetracycline, Tetracyclines & related, and Penicillins   History: Past Medical History:  Diagnosis Date   Arthritis    CAD (coronary artery disease)    CKD (chronic kidney disease)    CVA (cerebral infarction)    L sided   Dysrhythmia    Hyperlipidemia    Lymphadenopathy    Pituitary adenoma (HCC)    Presence of permanent cardiac pacemaker    Recurrent depression (HCC)    Shortness  of breath dyspnea    Sick sinus syndrome (HCC)    Stroke [facility])    Past Surgical History:  Procedure Laterality Date   ABDOMINAL HYSTERECTOMY     COLONOSCOPY WITH PROPOFOL N/A 01/05/2016   Procedure: COLONOSCOPY WITH PROPOFOL;  Surgeon: Lucilla Lame, MD;  Location: [facility] ENDOSCOPY;  Service: Endoscopy;  Laterality: N/A;    PACEMAKER INSERTION  2015   Dr. Josefa Half   Family History  Problem Relation Age of Onset   Hypertension Mother    Hypertension Father    Diabetes Father        under control   Hypertension Maternal Grandmother    Depression Maternal Grandmother    Hypertension Maternal Grandfather    Hypertension Paternal Grandmother    Hypertension Paternal Grandfather    Thyroid disease Maternal Aunt    Social History   Socioeconomic History   Marital status: Married    Spouse name: Not on file   Number of children: Not on file   Years of education: Not on file   Highest education level: Not on file  Occupational History   Not on file  Tobacco Use   Smoking status: Never   Smokeless tobacco: Never  Vaping Use   Vaping Use: Never used  Substance and Sexual Activity   Alcohol use: No   Drug use: No   Sexual activity: Not on file  Other Topics Concern   Not on file  Social History Narrative   Not on file   Social Determinants of Health   Financial Resource Strain: Low Risk    Difficulty of Paying Living Expenses: Not hard at all  Food Insecurity: No Food Insecurity   Worried About Charity fundraiser in the Last Year: Never true   [place] in the Last Year: Never true  Transportation Needs: No Transportation Needs   Lack of Transportation (Medical): No   Lack of Transportation (Non-Medical): No  Physical Activity: Inactive   Days of Exercise per Week: 0 days   Minutes of Exercise per Session: 0 min  Stress: No Stress Concern Present   Feeling of Stress : Not at all  Social Connections: Moderately Isolated   Frequency of Communication with Friends and Family: More than three times a week   Frequency of Social Gatherings with Friends and Family: Once a week   Attends Religious Services: Never   Marine scientist or Organizations: No   Attends Music therapist: Never   Marital Status: Married    Tobacco Counseling Counseling given: Not  Answered   Clinical Intake:  Pre-visit preparation completed: Yes  Pain : No/denies pain Pain Score: 8      Nutritional Risks: None Diabetes: Yes CBG done?: No Did pt. bring in CBG monitor from home?: No  How often do you need to have someone help you when you read instructions, pamphlets, or other written materials from your doctor or pharmacy?: 1 - Never  Diabetic?  Yes    Nutrition Risk Assessment:  Has the patient had any N/V/D within the last 2 months?  No  Does the patient have any non-healing wounds?  No  Has the patient had any unintentional weight loss or weight gain?  No   Diabetes:  Is the patient diabetic?  Yes  If diabetic, was a CBG obtained today?  No  Did the patient bring in their glucometer from home?  No  How often do you monitor your CBG's? 2x a day.  Financial Strains and Diabetes Management:  Are you having any financial strains with the device, your supplies or your medication? No .  Does the patient want to be seen by Chronic Care Management for management of their diabetes?  No  Would the patient like to be referred to a Nutritionist or for Diabetic Management?  No   Diabetic Exams:  Diabetic Eye Exam: Completed Pt has been advised about the importance in completing this exam.  Diabetic Foot Exam: . Pt has been advised about the importance in completing this exam.   Information entered by :: Leroy Kennedy LPN   Activities of Daily Living In your present state of health, do you have any difficulty performing the following activities: 01/24/2022  Hearing? N  Vision? N  Difficulty concentrating or making decisions? N  Walking or climbing stairs? Y  Dressing or bathing? Y  Doing errands, shopping? Y  Preparing Food and eating ? N  Using the Toilet? N  In the past six months, have you accidently leaked urine? N  Do you have problems with loss of bowel control? N  Managing your Medications? N  Managing your Finances? N  Housekeeping or  managing your Housekeeping? Y  Some recent data might be hidden    Patient Care Team: [name], NP as PCP - General Yolonda Kida, MD as Consulting Physician (Cardiology) [name], RN as Case Manager (General Practice)  Indicate any recent Medical Services you may have received from other than [facility] providers in the past year (date may be approximate).     Assessment:   This is a routine wellness examination for Lorae.  Hearing/Vision screen Hearing Screening - Comments:: No trouble hearing Vision Screening - Comments:: Ellin Mayhew  Up to date  Dietary issues and exercise activities discussed: Current Exercise Habits: The patient does not participate in regular exercise at present   Goals Addressed             This Visit's Progress    Patient Stated       Get around better       Depression Screen PHQ 2/9 Scores 01/24/2022 01/21/2022 09/08/2021 12/29/2020 11/16/2020 12/04/2019 11/06/2019  PHQ - 2 Score 1 0 0 0 0 0 0  PHQ- 9 Score 4 0 - 0 0 0 -    Fall Risk Fall Risk  01/24/2022 01/21/2022 12/29/2020 11/16/2020 12/04/2019  Falls in the past year? 0 0 0 0 1  Number falls in past yr: 0 0 - - 0  Injury with Fall? 0 0 0 - 0  Risk for fall due to : Impaired balance/gait No Fall Risks Impaired balance/gait;Impaired mobility;Other (Comment) Impaired mobility;Medication side effect -  Follow up Falls evaluation completed;Education provided;Falls prevention discussed Falls evaluation completed Falls evaluation completed Falls evaluation completed;Education provided;Falls prevention discussed -    FALL RISK PREVENTION PERTAINING TO THE HOME:  Any stairs in or around the home? Yes  If so, are there any without handrails? No  Home free of loose throw rugs in walkways, pet beds, electrical cords, etc? Yes  Adequate lighting in your home to reduce risk of falls? Yes   ASSISTIVE DEVICES UTILIZED TO PREVENT FALLS:  Life alert? No  Use of a cane, walker or w/c? Yes   Grab bars in the bathroom? No  Shower chair or bench in shower? No  Elevated toilet seat or a handicapped toilet? No   TIMED UP AND GO:  Was the test performed? No .    Cognitive  Function:    Normal cognitive status assessed by direct observation by this Nurse Health Advisor. No abnormalities found.     6CIT Screen 11/16/2020 11/01/2018 10/26/2017  What Year? 0 points 0 points 0 points  What month? 0 points 0 points 0 points  What time? 0 points 0 points 0 points  Count back from 20 0 points 0 points 0 points  Months in reverse 0 points 0 points 0 points  Repeat phrase 6 points 0 points 2 points  Total Score 6 0 2    Immunizations Immunization History  Administered Date(s) Administered   PFIZER(Purple Top)SARS-COV-2 Vaccination 02/01/2020, 02/22/2020, 11/04/2020   Pneumococcal Conjugate-13 06/03/2014   Pneumococcal Polysaccharide-23 08/07/2007, 12/29/2020   Td 07/18/2006    TDAP status: Due, Education has been provided regarding the importance of this vaccine. Advised may receive this vaccine at local pharmacy or Health Dept. Aware to provide a copy of the vaccination record if obtained from local pharmacy or Health Dept. Verbalized acceptance and understanding.  Flu Vaccine status: Declined, Education has been provided regarding the importance of this vaccine but patient still declined. Advised may receive this vaccine at local pharmacy or Health Dept. Aware to provide a copy of the vaccination record if obtained from local pharmacy or Health Dept. Verbalized acceptance and understanding.  Pneumococcal vaccine status: Up to date  Covid-19 vaccine status: Declined, Education has been provided regarding the importance of this vaccine but patient still declined. Advised may receive this vaccine at local pharmacy or Health Dept.or vaccine clinic. Aware to provide a copy of the vaccination record if obtained from local pharmacy or Health Dept. Verbalized acceptance and  understanding.  Qualifies for Shingles Vaccine? Yes   Zostavax completed No   Shingrix Completed?: No.    Education has been provided regarding the importance of this vaccine. Patient has been advised to call insurance company to determine out of pocket expense if they have not yet received this vaccine. Advised may also receive vaccine at local pharmacy or Health Dept. Verbalized acceptance and understanding.  Screening Tests Health Maintenance  Topic Date Due   DEXA SCAN  Never done   COVID-19 Vaccine (4 - Booster for Pfizer series) 12/30/2020   Zoster Vaccines- Shingrix (1 of 2) 04/23/2022 (Originally 05/15/2005)   MAMMOGRAM  08/28/2022   COLONOSCOPY (Pts 45-13yr Insurance coverage will need to be confirmed)  01/04/2026   Pneumonia Vaccine 66 Years old  Completed   Hepatitis C Screening  Completed   HPV VACCINES  Aged Out   INFLUENZA VACCINE  Discontinued   TETANUS/TDAP  Discontinued    Health Maintenance  Health Maintenance Due  Topic Date Due   DEXA SCAN  Never done   COVID-19 Vaccine (4 - Booster for PBanksseries) 12/30/2020    Colorectal cancer screening: Type of screening: Colonoscopy. Completed 2017. Repeat every 10 years  Mammogram status: Ordered  . Pt provided with contact info and advised to call to schedule appt.   Bone Density status: Ordered  . Pt provided with contact info and advised to call to schedule appt.  Lung Cancer Screening: (Low Dose CT Chest recommended if Age 324-80years, 30 pack-year currently smoking OR have quit w/in 15years.) does not qualify.   Lung Cancer Screening Referral:   Additional Screening:  Hepatitis C Screening: does not qualify; Completed 2018  Vision Screening: Recommended annual ophthalmology exams for early detection of glaucoma and other disorders of the eye. Is the patient up to date with their annual eye exam?  Yes  Who is the provider or what is the name of the office in which the patient attends annual eye exams?  [place] If pt is not established with a provider, would they like to be referred to a provider to establish care? No .   Dental Screening: Recommended annual dental exams for proper oral hygiene  Community Resource Referral / Chronic Care Management: CRR required this visit?  No   CCM required this visit?  No      Plan:     I have personally reviewed and noted the following in the patients chart:   Medical and social history Use of alcohol, tobacco or illicit drugs  Current medications and supplements including opioid prescriptions.  Functional ability and status Nutritional status Physical activity Advanced directives List of other physicians Hospitalizations, surgeries, and ER visits in previous 12 months Vitals Screenings to include cognitive, depression, and falls Referrals and appointments  In addition, I have reviewed and discussed with patient certain preventive protocols, quality metrics, and best practice recommendations. A written personalized care plan for preventive services as well as general preventive health recommendations were provided to patient.     Leroy Kennedy, LPN   9/00/9200   Nurse Notes:

## 2022-01-24 NOTE — Telephone Encounter (Signed)
Requested medication (s) are due for refill today - expired Rx ? ?Requested medication (s) are on the active medication list -yes ? ?Future visit scheduled -yes ? ?Last refill: 12/29/20 100g 1RF ? ?Notes to clinic: Request RF: expired Rx ? ?Requested Prescriptions  ?Pending Prescriptions Disp Refills  ? diclofenac Sodium (VOLTAREN) 1 % GEL 100 g 1  ?  Sig: Apply 2 g topically 4 (four) times daily.  ?  ? Analgesics:  Topicals Failed - 01/24/2022 11:18 AM  ?  ?  Failed - Manual Review: Labs are only required if the patient has taken medication for more than 8 weeks.  ?  ?  Failed - Cr in normal range and within 360 days  ?  Creatinine  ?Date Value Ref Range Status  ?07/12/2014 1.37 (H) 0.60 - 1.30 mg/dL Final  ? ?Creatinine, Ser  ?Date Value Ref Range Status  ?01/21/2022 1.16 (H) 0.57 - 1.00 mg/dL Final  ?  ?  ?  ?  Passed - PLT in normal range and within 360 days  ?  Platelets  ?Date Value Ref Range Status  ?01/21/2022 311 150 - 450 x10E3/uL Final  ?  ?  ?  ?  Passed - HGB in normal range and within 360 days  ?  Hemoglobin  ?Date Value Ref Range Status  ?01/21/2022 14.9 11.1 - 15.9 g/dL Final  ?  ?  ?  ?  Passed - HCT in normal range and within 360 days  ?  Hematocrit  ?Date Value Ref Range Status  ?01/21/2022 43.8 34.0 - 46.6 % Final  ?  ?  ?  ?  Passed - eGFR is 30 or above and within 360 days  ?  EGFR (African American)  ?Date Value Ref Range Status  ?07/12/2014 49 (L)  Final  ? ?GFR calc Af Amer  ?Date Value Ref Range Status  ?12/29/2020 59 (L) >59 mL/min/1.73 Final  ?  Comment:  ?  **In accordance with recommendations from the NKF-ASN Task force,** ?  [facility] is in the process of updating its eGFR calculation to the ?  2021 CKD-EPI creatinine equation that estimates kidney function ?  without a race variable. ?  ? ?EGFR (Non-African Amer.)  ?Date Value Ref Range Status  ?07/12/2014 42 (L)  Final  ?  Comment:  ?  eGFR values <45m/min/1.73 m2 may be an indication of chronic ?kidney disease (CKD). ?Calculated eGFR  is useful in patients with stable renal function. ?The eGFR calculation will not be reliable in acutely ill patients ?when serum creatinine is changing rapidly. It is not useful in  ?patients on dialysis. The eGFR calculation may not be applicable ?to patients at the low and high extremes of body sizes, pregnant ?women, and vegetarians. ?  ? ?GFR calc non Af Amer  ?Date Value Ref Range Status  ?12/29/2020 51 (L) >59 mL/min/1.73 Final  ? ?eGFR  ?Date Value Ref Range Status  ?01/21/2022 52 (L) >59 mL/min/1.73 Final  ?  ?  ?  ?  Passed - Patient is not pregnant  ?  ?  Passed - Valid encounter within last 12 months  ?  Recent Outpatient Visits   ? ?      ? 3 days ago Annual physical exam  ? [place]  ? 6 months ago Hypertensive kidney disease with stage 3b [place] kidney disease [place]  ? [place]  ? 1 year ago Annual physical exam  ?  [place], NP  ? 1 year ago Hypertensive kidney disease with stage 3 chronic kidney disease, unspecified whether stage 3a or 3b CKD  ? [place], [place], [place]  ? 2 years ago Hypertensive kidney disease with stage 3 chronic kidney disease, unspecified whether stage 3a or 3b CKD  ? [place], [place], [place]  ? ?  ?  ?Future Appointments   ? ?        ? In 6 months [name], NP [facility]  ? ?  ? ?  ?  ?  ? ? ? ?Requested Prescriptions  ?Pending Prescriptions Disp Refills  ? diclofenac Sodium (VOLTAREN) 1 % GEL 100 g 1  ?  Sig: Apply 2 g topically 4 (four) times daily.  ?  ? Analgesics:  Topicals Failed - 01/24/2022 11:18 AM  ?  ?  Failed - Manual Review: Labs are only required if the patient has taken medication for more than 8 weeks.  ?  ?  Failed - Cr in normal range and within 360 days  ?  Creatinine  ?Date Value Ref Range Status  ?07/12/2014 1.37 (H) 0.60 - 1.30 mg/dL Final  ? ?Creatinine, Ser   ?Date Value Ref Range Status  ?01/21/2022 1.16 (H) 0.57 - 1.00 mg/dL Final  ?  ?  ?  ?  Passed - PLT in normal range and within 360 days  ?  Platelets  ?Date Value Ref Range Status  ?01/21/2022 311 150 - 450 x10E3/uL Final  ?  ?  ?  ?  Passed - HGB in normal range and within 360 days  ?  Hemoglobin  ?Date Value Ref Range Status  ?01/21/2022 14.9 11.1 - 15.9 g/dL Final  ?  ?  ?  ?  Passed - HCT in normal range and within 360 days  ?  Hematocrit  ?Date Value Ref Range Status  ?01/21/2022 43.8 34.0 - 46.6 % Final  ?  ?  ?  ?  Passed - eGFR is 30 or above and within 360 days  ?  EGFR (African American)  ?Date Value Ref Range Status  ?07/12/2014 49 (L)  Final  ? ?GFR calc Af Amer  ?Date Value Ref Range Status  ?12/29/2020 59 (L) >59 mL/min/1.73 Final  ?  Comment:  ?  **In accordance with recommendations from the NKF-ASN Task force,** ?  [facility] is in the process of updating its eGFR calculation to the ?  2021 CKD-EPI creatinine equation that estimates kidney function ?  without a race variable. ?  ? ?EGFR (Non-African Amer.)  ?Date Value Ref Range Status  ?07/12/2014 42 (L)  Final  ?  Comment:  ?  eGFR values <42m/min/1.73 m2 may be an indication of chronic ?kidney disease (CKD). ?Calculated eGFR is useful in patients with stable renal function. ?The eGFR calculation will not be reliable in acutely ill patients ?when serum creatinine is changing rapidly. It is not useful in  ?patients on dialysis. The eGFR calculation may not be applicable ?to patients at the low and high extremes of body sizes, pregnant ?women, and vegetarians. ?  ? ?GFR calc non Af Amer  ?Date Value Ref Range Status  ?12/29/2020 51 (L) >59 mL/min/1.73 Final  ? ?eGFR  ?Date Value Ref Range Status  ?01/21/2022 52 (L) >59 mL/min/1.73 Final  ?  ?  ?  ?  Passed - Patient is not pregnant  ?  ?  Passed - Valid  encounter within last 12 months  ?  Recent Outpatient Visits   ? ?      ? 3 days ago Annual physical exam  ? [place], NP  ? 6 months ago Hypertensive kidney disease with stage 3b chronic kidney disease ([place])  ? [place], NP  ? 1 year ago Annual physical exam  ? [facility] [name], NP  ? 1 year ago Hypertensive kidney disease with stage 3 chronic kidney disease, unspecified whether stage 3a or 3b CKD  ? [place], [place], [place]  ? 2 years ago Hypertensive kidney disease with stage 3 chronic kidney disease, unspecified whether stage 3a or 3b CKD  ? [place], [place], [place]  ? ?  ?  ?Future Appointments   ? ?        ? In 6 months [name], NP [facility], [facility]  ? ?  ? ?  ?  ?  ? ? ? ?

## 2022-01-24 NOTE — Patient Instructions (Signed)
[name] , Thank you for taking time to come for your Medicare Wellness Visit. I appreciate your ongoing commitment to your health goals. Please review the following plan we discussed and let me know if I can assist you in the future.   Screening recommendations/referrals: Colonoscopy: up to date Mammogram: Education provided Bone Density: Education provided Recommended yearly ophthalmology/optometry visit for glaucoma screening and checkup Recommended yearly dental visit for hygiene and checkup  Vaccinations: Influenza vaccine: Education provided Pneumococcal vaccine: up to date Tdap vaccine: Education provided Shingles vaccine: Education provided    Advanced directives: Education provided  Conditions/risks identified:   Next appointment: 03-02-2022 @ 9:00  Case Manager  telephone        Preventive Care 46 Years and Older, Female Preventive care refers to lifestyle choices and visits with your health care provider that can promote health and wellness. What does preventive care include? A yearly physical exam. This is also called an annual well check. Dental exams once or twice a year. Routine eye exams. Ask your health care provider how often you should have your eyes checked. Personal lifestyle choices, including: Daily care of your teeth and gums. Regular physical activity. Eating a healthy diet. Avoiding tobacco and drug use. Limiting alcohol use. Practicing safe sex. Taking low-dose aspirin every day. Taking vitamin and mineral supplements as recommended by your health care provider. What happens during an annual well check? The services and screenings done by your health care provider during your annual well check will depend on your age, overall health, lifestyle risk factors, and family history of disease. Counseling  Your health care provider may ask you questions about your: Alcohol use. Tobacco use. Drug use. Emotional well-being. Home and relationship  well-being. Sexual activity. Eating habits. History of falls. Memory and ability to understand (cognition). Work and work Statistician. Reproductive health. Screening  You may have the following tests or measurements: Height, weight, and BMI. Blood pressure. Lipid and cholesterol levels. These may be checked every 5 years, or more frequently if you are over 55 years old. Skin check. Lung cancer screening. You may have this screening every year starting at age 70 if you have a 30-pack-year history of smoking and currently smoke or have quit within the past 15 years. Fecal occult blood test (FOBT) of the stool. You may have this test every year starting at age 23. Flexible sigmoidoscopy or colonoscopy. You may have a sigmoidoscopy every 5 years or a colonoscopy every 10 years starting at age 64. Hepatitis C blood test. Hepatitis B blood test. Sexually transmitted disease (STD) testing. Diabetes screening. This is done by checking your blood sugar (glucose) after you have not eaten for a while (fasting). You may have this done every 1-3 years. Bone density scan. This is done to screen for osteoporosis. You may have this done starting at age 56. Mammogram. This may be done every 1-2 years. Talk to your health care provider about how often you should have regular mammograms. Talk with your health care provider about your test results, treatment options, and if necessary, the need for more tests. Vaccines  Your health care provider may recommend certain vaccines, such as: Influenza vaccine. This is recommended every year. Tetanus, diphtheria, and acellular pertussis (Tdap, Td) vaccine. You may need a Td booster every 10 years. Zoster vaccine. You may need this after age 2. Pneumococcal 13-valent conjugate (PCV13) vaccine. One dose is recommended after age 81. Pneumococcal polysaccharide (PPSV23) vaccine. One dose is recommended after age 34. Talk  to your health care provider about which  screenings and vaccines you need and how often you need them. This information is not intended to replace advice given to you by your health care provider. Make sure you discuss any questions you have with your health care provider. Document Released: 11/27/2015 Document Revised: 07/20/2016 Document Reviewed: 09/01/2015 Elsevier Interactive Patient Education  2017 [employer] Prevention in the Home Falls can cause injuries. They can happen to people of all ages. There are many things you can do to make your home safe and to help prevent falls. What can I do on the outside of my home? Regularly fix the edges of walkways and driveways and fix any cracks. Remove anything that might make you trip as you walk through a door, such as a raised step or threshold. Trim any bushes or trees on the path to your home. Use bright outdoor lighting. Clear any walking paths of anything that might make someone trip, such as rocks or tools. Regularly check to see if handrails are loose or broken. Make sure that both sides of any steps have handrails. Any raised decks and porches should have guardrails on the edges. Have any leaves, snow, or ice cleared regularly. Use sand or salt on walking paths during winter. Clean up any spills in your garage right away. This includes oil or grease spills. What can I do in the bathroom? Use night lights. Install grab bars by the toilet and in the tub and shower. Do not use towel bars as grab bars. Use non-skid mats or decals in the tub or shower. If you need to sit down in the shower, use a plastic, non-slip stool. Keep the floor dry. Clean up any water that spills on the floor as soon as it happens. Remove soap buildup in the tub or shower regularly. Attach bath mats securely with double-sided non-slip rug tape. Do not have throw rugs and other things on the floor that can make you trip. What can I do in the bedroom? Use night lights. Make sure that you have a  light by your bed that is easy to reach. Do not use any sheets or blankets that are too big for your bed. They should not hang down onto the floor. Have a firm chair that has side arms. You can use this for support while you get dressed. Do not have throw rugs and other things on the floor that can make you trip. What can I do in the kitchen? Clean up any spills right away. Avoid walking on wet floors. Keep items that you use a lot in easy-to-reach places. If you need to reach something above you, use a strong step stool that has a grab bar. Keep electrical cords out of the way. Do not use floor polish or wax that makes floors slippery. If you must use wax, use non-skid floor wax. Do not have throw rugs and other things on the floor that can make you trip. What can I do with my stairs? Do not leave any items on the stairs. Make sure that there are handrails on both sides of the stairs and use them. Fix handrails that are broken or loose. Make sure that handrails are as long as the stairways. Check any carpeting to make sure that it is firmly attached to the stairs. Fix any carpet that is loose or worn. Avoid having throw rugs at the top or bottom of the stairs. If you do have throw rugs,  attach them to the floor with carpet tape. Make sure that you have a light switch at the top of the stairs and the bottom of the stairs. If you do not have them, ask someone to add them for you. What else can I do to help prevent falls? Wear shoes that: Do not have high heels. Have rubber bottoms. Are comfortable and fit you well. Are closed at the toe. Do not wear sandals. If you use a stepladder: Make sure that it is fully opened. Do not climb a closed stepladder. Make sure that both sides of the stepladder are locked into place. Ask someone to hold it for you, if possible. Clearly mark and make sure that you can see: Any grab bars or handrails. First and last steps. Where the edge of each step  is. Use tools that help you move around (mobility aids) if they are needed. These include: Canes. Walkers. Scooters. Crutches. Turn on the lights when you go into a dark area. Replace any light bulbs as soon as they burn out. Set up your furniture so you have a clear path. Avoid moving your furniture around. If any of your floors are uneven, fix them. If there are any pets around you, be aware of where they are. Review your medicines with your doctor. Some medicines can make you feel dizzy. This can increase your chance of falling. Ask your doctor what other things that you can do to help prevent falls. This information is not intended to replace advice given to you by your health care provider. Make sure you discuss any questions you have with your health care provider. Document Released: 08/27/2009 Document Revised: 04/07/2016 Document Reviewed: 12/05/2014 Elsevier Interactive Patient Education  2017 [employer].

## 2022-01-24 NOTE — Progress Notes (Signed)
Please let patient know that her lab results show that her kidney function is stable and improved some from 6 months ago.  Her A1c is well controlled at 6.4.  Keep up the good work.  Her cholesterol is also well controlled.   ? ?Please ask her how she is feeling today.

## 2022-01-24 NOTE — Telephone Encounter (Signed)
Patient has been notified of lab results. Patient states she is feels good except her knees are bothering her and she request RF of the Voltaren gel. Request has been forwarded to [place] [facility]. Patient also is requesting Rx for diabetic shoes- she would like to use the store in [place] across from the [facility]- not sure of the name. ?

## 2022-01-25 MED ORDER — DICLOFENAC SODIUM 1 % EX GEL: 2.0000 g | Freq: Four times a day (QID) | CUTANEOUS | 1 refills | Status: DC

## 2022-02-02 ENCOUNTER — Other Ambulatory Visit: Payer: Self-pay | Admitting: Nurse Practitioner

## 2022-02-04 NOTE — Telephone Encounter (Signed)
Requested Prescriptions  ?Pending Prescriptions Disp Refills  ?? JARDIANCE 10 MG TABS tablet [Pharmacy Med Name: JARDIANCE 10 MG TABLET] 30 tablet 0  ?  Sig: Take 1 tablet (10 mg total) by mouth daily before breakfast.  ?  ? Endocrinology:  Diabetes - SGLT2 Inhibitors Failed - 02/02/2022 11:24 AM  ?  ?  Failed - Cr in normal range and within 360 days  ?  Creatinine  ?Date Value Ref Range Status  ?07/12/2014 1.37 (H) 0.60 - 1.30 mg/dL Final  ? ?Creatinine, Ser  ?Date Value Ref Range Status  ?01/21/2022 1.16 (H) 0.57 - 1.00 mg/dL Final  ?   ?  ?  Failed - eGFR in normal range and within 360 days  ?  EGFR (African American)  ?Date Value Ref Range Status  ?07/12/2014 49 (L)  Final  ? ?GFR calc Af Amer  ?Date Value Ref Range Status  ?12/29/2020 59 (L) >59 mL/min/1.73 Final  ?  Comment:  ?  **In accordance with recommendations from the NKF-ASN Task force,** ?  [facility] is in the process of updating its eGFR calculation to the ?  2021 CKD-EPI creatinine equation that estimates kidney function ?  without a race variable. ?  ? ?EGFR (Non-African Amer.)  ?Date Value Ref Range Status  ?07/12/2014 42 (L)  Final  ?  Comment:  ?  eGFR values <69m/min/1.73 m2 may be an indication of chronic ?kidney disease (CKD). ?Calculated eGFR is useful in patients with stable renal function. ?The eGFR calculation will not be reliable in acutely ill patients ?when serum creatinine is changing rapidly. It is not useful in  ?patients on dialysis. The eGFR calculation may not be applicable ?to patients at the low and high extremes of body sizes, pregnant ?women, and vegetarians. ?  ? ?GFR calc non Af Amer  ?Date Value Ref Range Status  ?12/29/2020 51 (L) >59 mL/min/1.73 Final  ? ?eGFR  ?Date Value Ref Range Status  ?01/21/2022 52 (L) >59 mL/min/1.73 Final  ?   ?  ?  Passed - HBA1C is between 0 and 7.9 and within 180 days  ?  Hgb A1c MFr Bld  ?Date Value Ref Range Status  ?01/21/2022 6.4 (H) 4.8 - 5.6 % Final  ?  Comment:  ?           Prediabetes:  5.7 - 6.4 ?         Diabetes: >6.4 ?         Glycemic control for adults with diabetes: <7.0 ?  ?   ?  ?  Passed - Valid encounter within last 6 months  ?  Recent Outpatient Visits   ?      ? 2 weeks ago Annual physical exam  ? [place]  ? 6 months ago Hypertensive kidney disease with stage 3b [place] kidney [place]  ? [place]  ? 1 year ago [facility]  [facility]  ? 1 year ago Hypertensive kidney disease with stage 3 chronic kidney disease, unspecified whether stage 3a or 3b CKD  ? [place] ? 2 years ago Hypertensive kidney disease with stage 3 chronic kidney disease, unspecified whether stage 3a or 3b CKD  ? [place] ?  ?  ?Future Appointments   ?        ? In 5 months HJon Billings NP  [place]  ?  ? ?  ?  ?  ? ?

## 2022-02-15 ENCOUNTER — Ambulatory Visit: Payer: Medicare Other

## 2022-02-16 DIAGNOSIS — I495 Sick sinus syndrome: Secondary | ICD-10-CM | POA: Diagnosis not present

## 2022-03-01 DIAGNOSIS — I495 Sick sinus syndrome: Secondary | ICD-10-CM | POA: Diagnosis not present

## 2022-03-01 DIAGNOSIS — E7849 Other hyperlipidemia: Secondary | ICD-10-CM | POA: Diagnosis not present

## 2022-03-01 DIAGNOSIS — I509 Heart failure, unspecified: Secondary | ICD-10-CM | POA: Diagnosis not present

## 2022-03-01 DIAGNOSIS — R011 Cardiac murmur, unspecified: Secondary | ICD-10-CM | POA: Diagnosis not present

## 2022-03-01 DIAGNOSIS — I6350 Cerebral infarction due to unspecified occlusion or stenosis of unspecified cerebral artery: Secondary | ICD-10-CM | POA: Diagnosis not present

## 2022-03-01 DIAGNOSIS — Z45010 Encounter for checking and testing of cardiac pacemaker pulse generator [battery]: Secondary | ICD-10-CM | POA: Diagnosis not present

## 2022-03-01 DIAGNOSIS — I10 Essential (primary) hypertension: Secondary | ICD-10-CM | POA: Diagnosis not present

## 2022-03-01 DIAGNOSIS — I639 Cerebral infarction, unspecified: Secondary | ICD-10-CM | POA: Diagnosis not present

## 2022-03-01 DIAGNOSIS — R0602 Shortness of breath: Secondary | ICD-10-CM | POA: Diagnosis not present

## 2022-03-01 DIAGNOSIS — T82110S Breakdown (mechanical) of cardiac electrode, sequela: Secondary | ICD-10-CM | POA: Diagnosis not present

## 2022-03-02 ENCOUNTER — Telehealth: Payer: Medicare Other

## 2022-03-02 ENCOUNTER — Ambulatory Visit (INDEPENDENT_AMBULATORY_CARE_PROVIDER_SITE_OTHER): Payer: Medicare Other

## 2022-03-02 DIAGNOSIS — I10 Essential (primary) hypertension: Secondary | ICD-10-CM

## 2022-03-02 DIAGNOSIS — E7800 Pure hypercholesterolemia, unspecified: Secondary | ICD-10-CM

## 2022-03-02 DIAGNOSIS — I129 Hypertensive chronic kidney disease with stage 1 through stage 4 chronic kidney disease, or unspecified chronic kidney disease: Secondary | ICD-10-CM

## 2022-03-02 DIAGNOSIS — R7303 Prediabetes: Secondary | ICD-10-CM

## 2022-03-02 NOTE — Chronic Care Management (AMB) (Signed)
?Chronic Care Management  ? ?CCM RN Visit Note ? ?03/02/2022 ?Name: Mary Webb MRN: [id number] DOB: 12-29-1955 ? ?Subjective: ?Mary Webb is a 66 y.o. year old female who is a primary care patient of [name], NP. The care management team was consulted for assistance with disease management and care coordination needs.   ? ?Engaged with patient by telephone for follow up visit in response to provider referral for case management and/or care coordination services.  ? ?Consent to Services:  ?The patient was given information about Chronic Care Management services, agreed to services, and gave verbal consent prior to initiation of services.  Please see initial visit note for detailed documentation.  ? ?Patient agreed to services and verbal consent obtained.  ? ?Assessment: Review of patient past medical history, allergies, medications, health status, including review of consultants reports, laboratory and other test data, was performed as part of comprehensive evaluation and provision of chronic care management services.  ? ?SDOH (Social Determinants of Health) assessments and interventions performed:   ? ?CCM Care Plan ? ?Allergies  ?Allergen Reactions  ? Elemental Sulfur Anaphylaxis  ? Levaquin [Levofloxacin] Other (See Comments)  ?  Stroke  ? Tekturna [Aliskiren] Shortness Of Breath  ? Accupril [Quinapril Hcl]   ? Aspirin   ? Diovan [Valsartan]   ? Erythromycin   ? Iodine   ? Latex   ?  Can't remember reaction ?  ? Lotensin [Benazepril]   ? Norvasc [Amlodipine]   ? Shellfish Allergy Swelling  ? Sulfa Antibiotics   ? Tetanus Toxoids   ? Tetracycline   ?  Other reaction(s): Unknown  ? Tetracyclines & Related   ? Penicillins Rash  ? ? ?Outpatient Encounter Medications as of 03/02/2022  ?Medication Sig  ? Accu-Chek Softclix Lancets lancets Use to check blood sugar 3 times a day and document for visits. Goal is <130 fasting and <180 two hours after meal.,  ? Ascorbic Acid (VITAMIN C) 1000 MG tablet  Take by mouth. Every other day  ? Blood Glucose Monitoring Suppl (ACCU-CHEK AVIVA PLUS) w/Device KIT Use to check blood sugar 3 times a day and document for visits.  Goal is <130 fasting and <180 two hours after meal.  ? chlorthalidone (HYGROTON) 25 MG tablet Take 1 tablet (25 mg total) by mouth daily.  ? Cholecalciferol (VITAMIN D3) 1000 units CAPS Take by mouth. Taking once a week  ? clopidogrel (PLAVIX) 75 MG tablet Take 1 tablet (75 mg total) by mouth daily.  ? Cyanocobalamin (VITAMIN B 12) 500 MCG TABS Take by mouth daily.  ? diclofenac Sodium (VOLTAREN) 1 % GEL Apply 2 g topically 4 (four) times daily.  ? fluticasone furoate-vilanterol (BREO ELLIPTA) 100-25 MCG/INH AEPB Inhale 1 puff into the lungs daily.  ? glucose blood (ACCU-CHEK AVIVA PLUS) test strip Use to check blood sugar 3 times a day and document for visits. Goal is <130 fasting and <180 two hours after meal.,  ? JARDIANCE 10 MG TABS tablet Take 1 tablet (10 mg total) by mouth daily before breakfast.  ? losartan (COZAAR) 100 MG tablet Take 1 tablet (100 mg total) by mouth daily.  ? potassium chloride SA (KLOR-CON M) 20 MEQ tablet Take 1 tablet (20 mEq total) by mouth 2 (two) times daily.  ? pyridOXINE (VITAMIN B-6) 25 MG tablet Take by mouth daily.   ? ?No facility-administered encounter medications on file as of 03/02/2022.  ? ? ?Patient Active Problem List  ? Diagnosis Date Noted  ? Hypertension 12/29/2020  ?  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 Prediabetes 12/28/2020  ? Advanced care planning/counseling discussion 11/01/2017  ? Sick sinus syndrome ([place]) 04/28/2015  ? Hypertensive kidney disease with CKD stage III ([place]) 04/28/2015  ? Pituitary adenoma ([place]) 04/28/2015  ? History of CVA (cerebrovascular accident) 04/28/2015  ? Morbid obesity ([place]) 04/28/2015  ? Hyperlipidemia 04/28/2015  ? Stroke [facility]) 04/28/2015  ? ? ?Conditions to be addressed/monitored:HTN, HLD, pre- DMII, and CKD Stage 3 ? ?Care Plan : RNCM; General Plan of Care (Adult) for Chronic Disease  Management and Care Coordination Needs  ?Updates made by [name], RN since 03/02/2022 12:00 AM  ?  ? ?Problem: RNCM: Development of Plan of Care for Chronic Disease Management and Care coordination Needs( HTN, HLD, CKD3, Pre-DM2)   ?Priority: High  ?  ? ?Long-Range Goal: RNCM: Effective management  of Plan of Care for Chronic Disease Management and Care coordination Needs( HTN, HLD, CKD3, Pre-DM2)   ?Start Date: 09/08/2021  ?Expected End Date: 09/08/2022  ?Priority: High  ?Note:   ?Current Barriers:  ?Knowledge Deficits related to plan of care for management of HTN, HLD, DMII, and CKD Stage 3  ?Chronic Disease Management support and education needs related to HTN, HLD, DMII, and CKD Stage 3 ? ?RNCM Clinical Goal(s):  ?Patient will verbalize understanding of plan for management of HTN, HLD, DMII, and CKD Stage 3  ?verbalize basic understanding of HTN, HLD, DMII, and CKD Stage 3 disease process and self health management plan  ?take all medications exactly as prescribed and will call provider for medication related questions ?demonstrate understanding of rationale for each prescribed medication  ?attend all scheduled medical appointments: 07-25-2022 ?demonstrate improved and ongoing adherence to prescribed treatment plan for HTN, HLD, DMII, and CKD Stage 3 as evidenced by daily monitoring and recording of CBG  adherence to ADA/ carb modified diet adherence to prescribed medication regimen contacting provider for new or worsened symptoms or questions  ?demonstrate improved and ongoing health management independence  ?demonstrate a decrease in HTN, HLD, DMII, and CKD Stage 3 exacerbations  ?demonstrate ongoing self health care management ability effective management of chronic conditions through collaboration with RN Care manager, provider, and care team.  ? ?Interventions: ?1:1 collaboration with primary care provider regarding development and update of comprehensive plan of care as evidenced by provider  attestation and co-signature ?Inter-disciplinary care team collaboration (see longitudinal plan of care) ?Evaluation of current treatment plan related to  self management and patient's adherence to plan as established by provider ? ? ?SDOH Barriers (Status: Goal on track: YES.) Long Term Goal (>30 days) ?Patient interviewed and SDOH assessment performed ?       ?SDOH Interventions   ? ?Flowsheet Row Most Recent Value  ?SDOH Interventions   ?Food Insecurity Interventions Intervention Not Indicated  ?Financial Strain Interventions Intervention Not Indicated  ?Housing Interventions Intervention Not Indicated  ?Intimate Partner Violence Interventions Intervention Not Indicated  ?Physical Activity Interventions Intervention Not Indicated  ?Stress Interventions Intervention Not Indicated  ?Social Connections Interventions Intervention Not Indicated  ?Transportation Interventions Intervention Not Indicated  ? ?  ?Patient interviewed and appropriate assessments performed ?Provided patient with information about resources available for the patient in [facility] and care guides available to address needs as they arise related to SDOH ?Discussed plans with patient for ongoing care management follow up and provided patient with direct contact information for care management team ?Advised patient to call the office for changes in SDOH, questions or concerns ? ? ? ?Chronic Kidney Disease (Status: Goal  on track: YES.)  ?Last practice recorded BP readings:  ?BP Readings from Last 3 Encounters:  ?01/21/22 138/90  ?07/12/21 120/78  ?12/29/20 130/76  ?Most recent eGFR/CrCl:  ?Lab Results  ?Component Value Date  ? EGFR 52 (L) 01/21/2022  ?  No components found for: CRCL ? ?Assessed the patient     understanding of chronic kidney disease    ?Evaluation of current treatment plan related to chronic kidney disease self management and patient's adherence to plan as established by provider. 09-08-2021: The patient has started taking  Jardiance, education and support given. The patient states she has been peeing a lot but the pharmacist told her that should subside. The patient states the cost is a little high but she is managing. Knows to call the

## 2022-03-02 NOTE — Patient Instructions (Signed)
Visit Information ? ?Thank you for taking time to visit with me today. Please don't hesitate to contact me if I can be of assistance to you before our next scheduled telephone appointment. ? ?Following are the goals we discussed today:  ?RNCM Clinical Goal(s):  ?Patient will verbalize understanding of plan for management of HTN, HLD, DMII, and CKD Stage 3  ?verbalize basic understanding of HTN, HLD, DMII, and CKD Stage 3 disease process and self health management plan  ?take all medications exactly as prescribed and will call provider for medication related questions ?demonstrate understanding of rationale for each prescribed medication  ?attend all scheduled medical appointments: 07-25-2022 ?demonstrate improved and ongoing adherence to prescribed treatment plan for HTN, HLD, DMII, and CKD Stage 3 as evidenced by daily monitoring and recording of CBG  adherence to ADA/ carb modified diet adherence to prescribed medication regimen contacting provider for new or worsened symptoms or questions  ?demonstrate improved and ongoing health management independence  ?demonstrate a decrease in HTN, HLD, DMII, and CKD Stage 3 exacerbations  ?demonstrate ongoing self health care management ability effective management of chronic conditions through collaboration with RN Care manager, provider, and care team.  ?  ?Interventions: ?1:1 collaboration with primary care provider regarding development and update of comprehensive plan of care as evidenced by provider attestation and co-signature ?Inter-disciplinary care team collaboration (see longitudinal plan of care) ?Evaluation of current treatment plan related to  self management and patient's adherence to plan as established by provider ?  ?  ?SDOH Barriers (Status: Goal on track: YES.) Long Term Goal (>30 days) ?Patient interviewed and SDOH assessment performed ?       ?SDOH Interventions   ?  ?Flowsheet Row Most Recent Value  ?SDOH Interventions    ?Food Insecurity Interventions  Intervention Not Indicated  ?Financial Strain Interventions Intervention Not Indicated  ?Housing Interventions Intervention Not Indicated  ?Intimate Partner Violence Interventions Intervention Not Indicated  ?Physical Activity Interventions Intervention Not Indicated  ?Stress Interventions Intervention Not Indicated  ?Social Connections Interventions Intervention Not Indicated  ?Transportation Interventions Intervention Not Indicated  ?  ?   ?Patient interviewed and appropriate assessments performed ?Provided patient with information about resources available for the patient in [facility] and care guides available to address needs as they arise related to SDOH ?Discussed plans with patient for ongoing care management follow up and provided patient with direct contact information for care management team ?Advised patient to call the office for changes in SDOH, questions or concerns ?  ?  ?  ?Chronic Kidney Disease (Status: Goal on track: YES.)  ?Last practice recorded BP readings:  ?   ?BP Readings from Last 3 Encounters:  ?01/21/22 138/90  ?07/12/21 120/78  ?12/29/20 130/76  ?Most recent eGFR/CrCl:  ?     ?Lab Results  ?Component Value Date  ?  EGFR 52 (L) 01/21/2022  ?  No components found for: CRCL ?  ?Assessed the patient     understanding of chronic kidney disease    ?Evaluation of current treatment plan related to chronic kidney disease self management and patient's adherence to plan as established by provider. 09-08-2021: The patient has started taking Jardiance, education and support given. The patient states she has been peeing a lot but the pharmacist told her that should subside. The patient states the cost is a little high but she is managing. Knows to call the CCM team for changes or needs related to CKD and effective management.   11-10-2021: The patient is doing  well and denies any changes in her kidney health. She states that she had her pacemaker placed recently and is not having any issues.  Will continue to monitor. 03-02-2022: The patient is doing well and denies any issues related to kidney function. Discussed dietary restrictions and other factors that impact her kidney health. The patient is compliant with medications and plan of care. Kidney function actually improved. ?Provided education to patient re: stroke prevention, s/s of heart attack and stroke    ?Reviewed prescribed diet heart healthy/ADA diet. 03-02-2022: Is compliant with heart healthy/ADA diet  ?Reviewed medications with patient and discussed importance of compliance. 09-08-2021: Has been taking Jardiance since the end of August.   03-02-2022: Is compliant with medications. The patient states she may talk to the provider about stopping Jardiance.  ?Advised patient, providing education and rationale, to monitor blood pressure daily and record, calling PCP for findings outside established parameters    ?Discussed complications of poorly controlled blood pressure such as heart disease, stroke, circulatory complications, vision complications, kidney impairment, sexual dysfunction    ?Reviewed scheduled/upcoming provider appointments including: 07-25-2022   ?Advised patient to discuss treatment options and recommendations for controlling progression of CKD with provider    ?Discussed plans with patient for ongoing care management follow up and provided patient with direct contact information for care management team    ?Screening for signs and symptoms of depression related to chronic disease state      ?Discussed the impact of chronic kidney disease on daily life and mental health and acknowledged and normalized feelings of disempowerment, fear, and frustration    ?Assessed social determinant of health barriers    ?Provided education on kidney disease progression    ?Engage patient in early, proactive and ongoing discussion about goals of care and what matters most to them    ?Support coping and stress management by recognizing current  strategies and assist in developing new strategies such as mindfulness, journaling, relaxation techniques, problem-solving    ?  ?  ?Diabetes:  (Status: Goal on track: YES.) ?     ?Lab Results  ?Component Value Date  ?  HGBA1C 6.4 (H) 01/21/2022  ?Assessed patient's understanding of A1c goal: <7%. 03-02-2022: The patient is at goal ?Provided education to patient about basic DM disease process; ?Reviewed medications with patient and discussed importance of medication adherence. 09-08-2021: The patient is taking Jardiance. Is "peeing a lot", but denies any acute distress. States cost is a little high but she is managing. 03-02-2022: States compliance with medications and blood sugar range is good. ;        ?Reviewed prescribed diet with patient heart healthy/ADA diet. 03-02-2022: The patient does a lot of cooking. Her husband is diabetic and she fixes a lot of vegetables and salads for them.  She is mindful of watching sodium, fats, and sugars. Likes herbs and cooking with organic products; ?Counseled on importance of regular laboratory monitoring as prescribed. 03-02-2022;        ?Discussed plans with patient for ongoing care management follow up and provided patient with direct contact information for care management team;      ?Provided patient with written educational materials related to hypo and hyperglycemia and importance of correct treatment. 09-08-2021: Review of sx and sx. The patient has been to an education class with her husband and is aware of factors that cause blood sugars to change. States her blood sugars have been in 120's. 01-05-2022: The patient is very mindful of her blood sugars.  States the lowest she has seen is 67 and the highest 160. Will continue to monitor.       ?Reviewed scheduled/upcoming provider appointments including: 07-25-2022         ?Advised patient, providing education and rationale, to check cbg as directed and record.  09-08-2021: Review of blood sugars fasting <130 and post  prandial of <180. States her range has been in 120's. She is mindful of dietary restrictions as her husband is diabetic also. Will continue to monitor.    03-02-2022: The patient has a range of blood sugars 67 to 160.

## 2022-03-03 ENCOUNTER — Other Ambulatory Visit: Payer: Self-pay | Admitting: Internal Medicine

## 2022-03-03 DIAGNOSIS — R0602 Shortness of breath: Secondary | ICD-10-CM

## 2022-03-03 DIAGNOSIS — I509 Heart failure, unspecified: Secondary | ICD-10-CM

## 2022-03-08 ENCOUNTER — Other Ambulatory Visit: Payer: Self-pay | Admitting: Nurse Practitioner

## 2022-03-08 DIAGNOSIS — Z1231 Encounter for screening mammogram for malignant neoplasm of breast: Secondary | ICD-10-CM

## 2022-03-11 ENCOUNTER — Ambulatory Visit
Admission: RE | Admit: 2022-03-11 | Discharge: 2022-03-11 | Disposition: A | Discharge status: Home / Self Care | Payer: Medicare Other | Source: Ambulatory Visit | Attending: Internal Medicine | Admitting: Internal Medicine

## 2022-03-11 DIAGNOSIS — I2510 Atherosclerotic heart disease of native coronary artery without angina pectoris: Secondary | ICD-10-CM | POA: Diagnosis not present

## 2022-03-11 DIAGNOSIS — I3139 Other pericardial effusion (noninflammatory): Secondary | ICD-10-CM | POA: Diagnosis not present

## 2022-03-11 DIAGNOSIS — R0602 Shortness of breath: Secondary | ICD-10-CM

## 2022-03-11 DIAGNOSIS — Z8673 Personal history of transient ischemic attack (TIA), and cerebral infarction without residual deficits: Secondary | ICD-10-CM | POA: Insufficient documentation

## 2022-03-11 DIAGNOSIS — E785 Hyperlipidemia, unspecified: Secondary | ICD-10-CM | POA: Diagnosis not present

## 2022-03-11 DIAGNOSIS — Z950 Presence of cardiac pacemaker: Secondary | ICD-10-CM | POA: Insufficient documentation

## 2022-03-11 DIAGNOSIS — R0989 Other specified symptoms and signs involving the circulatory and respiratory systems: Secondary | ICD-10-CM | POA: Insufficient documentation

## 2022-03-11 DIAGNOSIS — R011 Cardiac murmur, unspecified: Secondary | ICD-10-CM | POA: Insufficient documentation

## 2022-03-11 DIAGNOSIS — I509 Heart failure, unspecified: Secondary | ICD-10-CM

## 2022-03-11 DIAGNOSIS — I739 Peripheral vascular disease, unspecified: Secondary | ICD-10-CM | POA: Insufficient documentation

## 2022-03-11 LAB — ECHOCARDIOGRAM COMPLETE
AR max vel: 2.7 cm2
AV Area VTI: 4.28 cm2
AV Area mean vel: 3.48 cm2
AV Mean grad: 2 mmHg
AV Peak grad: 6.1 mmHg
Ao pk vel: 1.23 m/s
Area-P 1/2: 2.16 cm2
MV VTI: 3.27 cm2
S' Lateral: 3.2 cm

## 2022-03-11 NOTE — Progress Notes (Signed)
*  PRELIMINARY RESULTS* ?Echocardiogram ?2D Echocardiogram has been performed. ? ?Mary Webb, Mary Webb ?03/11/2022, 10:24 AM ?

## 2022-03-13 DIAGNOSIS — I10 Essential (primary) hypertension: Secondary | ICD-10-CM

## 2022-03-13 DIAGNOSIS — N1832 Chronic kidney disease, stage 3b: Secondary | ICD-10-CM

## 2022-03-13 DIAGNOSIS — E7800 Pure hypercholesterolemia, unspecified: Secondary | ICD-10-CM

## 2022-03-13 DIAGNOSIS — I129 Hypertensive chronic kidney disease with stage 1 through stage 4 chronic kidney disease, or unspecified chronic kidney disease: Secondary | ICD-10-CM

## 2022-04-04 DIAGNOSIS — I10 Essential (primary) hypertension: Secondary | ICD-10-CM | POA: Diagnosis not present

## 2022-04-04 DIAGNOSIS — N1832 Chronic kidney disease, stage 3b: Secondary | ICD-10-CM | POA: Diagnosis not present

## 2022-04-04 DIAGNOSIS — Z45010 Encounter for checking and testing of cardiac pacemaker pulse generator [battery]: Secondary | ICD-10-CM | POA: Diagnosis not present

## 2022-04-04 DIAGNOSIS — Z8673 Personal history of transient ischemic attack (TIA), and cerebral infarction without residual deficits: Secondary | ICD-10-CM | POA: Diagnosis not present

## 2022-04-04 DIAGNOSIS — E7800 Pure hypercholesterolemia, unspecified: Secondary | ICD-10-CM | POA: Diagnosis not present

## 2022-04-04 DIAGNOSIS — R7303 Prediabetes: Secondary | ICD-10-CM | POA: Diagnosis not present

## 2022-04-04 DIAGNOSIS — I495 Sick sinus syndrome: Secondary | ICD-10-CM | POA: Diagnosis not present

## 2022-04-11 ENCOUNTER — Other Ambulatory Visit: Payer: Self-pay | Admitting: Nurse Practitioner

## 2022-04-11 DIAGNOSIS — I10 Essential (primary) hypertension: Secondary | ICD-10-CM

## 2022-04-11 DIAGNOSIS — I129 Hypertensive chronic kidney disease with stage 1 through stage 4 chronic kidney disease, or unspecified chronic kidney disease: Secondary | ICD-10-CM

## 2022-04-13 NOTE — Telephone Encounter (Signed)
Pt has refill available, [facility] 01/24/22  #90 1 refill Requested Prescriptions  Pending Prescriptions Disp Refills  . losartan (COZAAR) 100 MG tablet [Pharmacy Med Name: LOSARTAN POTASSIUM 100 MG TAB] 90 tablet 0    Sig: Take 1 tablet (100 mg total) by mouth daily.     Cardiovascular:  Angiotensin Receptor Blockers Failed - 04/11/2022  2:55 PM      Failed - Cr in normal range and within 180 days    Creatinine  Date Value Ref Range Status  07/12/2014 1.37 (H) 0.60 - 1.30 mg/dL Final   Creatinine, Ser  Date Value Ref Range Status  01/21/2022 1.16 (H) 0.57 - 1.00 mg/dL Final         Failed - Last BP in normal range    BP Readings from Last 1 Encounters:  01/21/22 138/90         Passed - K in normal range and within 180 days    Potassium  Date Value Ref Range Status  01/21/2022 3.7 3.5 - 5.2 mmol/L Final  07/12/2014 3.4 (L) 3.5 - 5.1 mmol/L Final         Passed - Patient is not pregnant      Passed - Valid encounter within last 6 months    Recent Outpatient Visits          2 months ago Annual physical exam   [facility] [name], NP   9 months ago 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

## 2022-05-04 ENCOUNTER — Telehealth: Payer: Medicare Other

## 2022-05-04 ENCOUNTER — Ambulatory Visit (INDEPENDENT_AMBULATORY_CARE_PROVIDER_SITE_OTHER): Payer: Medicare Other

## 2022-05-04 DIAGNOSIS — I10 Essential (primary) hypertension: Secondary | ICD-10-CM

## 2022-05-04 DIAGNOSIS — R7303 Prediabetes: Secondary | ICD-10-CM

## 2022-05-04 DIAGNOSIS — E7800 Pure hypercholesterolemia, unspecified: Secondary | ICD-10-CM

## 2022-05-04 DIAGNOSIS — N183 Chronic kidney disease, stage 3 unspecified: Secondary | ICD-10-CM

## 2022-05-04 NOTE — Patient Instructions (Signed)
Visit Information  Thank you for taking time to visit with me today. Please don't hesitate to contact me if I can be of assistance to you before our next scheduled telephone appointment.  Following are the goals we discussed today:  Chronic Kidney Disease (Status: Goal Met.) 05-04-2022: Goals met and care plan is being closed  Last practice recorded BP readings:     BP Readings from Last 3 Encounters:  01/21/22 138/90  07/12/21 120/78  12/29/20 130/76  Most recent eGFR/CrCl:       Lab Results  Component Value Date    EGFR 52 (L) 01/21/2022    No components found for: CRCL   Assessed the patient     understanding of chronic kidney disease. 05-04-2022: The patient is stable and doing well. Sees providers on a regular basis. Knows to call for changes or new needs.    Evaluation of current treatment plan related to chronic kidney disease self management and patient's adherence to plan as established by provider. 09-08-2021: The patient has started taking Jardiance, education and support given. The patient states she has been peeing a lot but the pharmacist told her that should subside. The patient states the cost is a little high but she is managing. Knows to call the CCM team for changes or needs related to CKD and effective management.   11-10-2021: The patient is doing well and denies any changes in her kidney health. She states that she had her pacemaker placed recently and is not having any issues. Will continue to monitor. 05-04-2022: The patient is doing well and denies any issues related to kidney function. Discussed dietary restrictions and other factors that impact her kidney health. The patient is compliant with medications and plan of care. Kidney function actually improved. Provided education to patient re: stroke prevention, s/s of heart attack and stroke    Reviewed prescribed diet heart healthy/ADA diet. 05-04-2022: Is compliant with heart healthy/ADA diet  Reviewed medications with  patient and discussed importance of compliance. 09-08-2021: Has been taking Jardiance since the end of August.   03-02-2022: Is compliant with medications. The patient states she may talk to the provider about stopping Jardiance. 05-04-2022: Is compliant with medications. Denies any needs at this time Advised patient, providing education and rationale, to monitor blood pressure daily and record, calling PCP for findings outside established parameters    Discussed complications of poorly controlled blood pressure such as heart disease, stroke, circulatory complications, vision complications, kidney impairment, sexual dysfunction    Reviewed scheduled/upcoming provider appointments including: 07-25-2022, reminder given today  Advised patient to discuss treatment options and recommendations for controlling progression of CKD with provider    Discussed plans with patient for ongoing care management follow up and provided patient with direct contact information for care management team    Screening for signs and symptoms of depression related to chronic disease state      Discussed the impact of chronic kidney disease on daily life and mental health and acknowledged and normalized feelings of disempowerment, fear, and frustration    Assessed social determinant of health barriers    Provided education on kidney disease progression    Engage patient in early, proactive and ongoing discussion about goals of care and what matters most to them    Support coping and stress management by recognizing current strategies and assist in developing new strategies such as mindfulness, journaling, relaxation techniques, problem-solving        Diabetes:  (Status: Goal Met.) 05-04-2022: Goals  met and care plan is being closed       Lab Results  Component Value Date    HGBA1C 6.4 (H) 01/21/2022  Assessed patient's understanding of A1c goal: <7%. 05-04-2022: The patient is under goal Provided education to patient about basic  DM disease process. 05-04-2022: The patient has a good understanding of DM and how to effectively manage. Her husband is also diabetic and she cooks healthy meals for them on a regular basis.; Reviewed medications with patient and discussed importance of medication adherence. 09-08-2021: The patient is taking Jardiance. Is "peeing a lot", but denies any acute distress. States cost is a little high but she is managing. 05-04-2022: States compliance with medications and blood sugar range is good. ;        Reviewed prescribed diet with patient heart healthy/ADA diet. 05-04-2022: The patient does a lot of cooking. Her husband is diabetic and she fixes a lot of vegetables and salads for them.  She is mindful of watching sodium, fats, and sugars. Likes herbs and cooking with organic products. Review and education provided; Counseled on importance of regular laboratory monitoring as prescribed. 05-04-2022 has on a regular basis. ;        Discussed plans with patient for ongoing care management follow up and provided patient with direct contact information for care management team;      Provided patient with written educational materials related to hypo and hyperglycemia and importance of correct treatment. 09-08-2021: Review of sx and sx. The patient has been to an education class with her husband and is aware of factors that cause blood sugars to change. States her blood sugars have been in 120's. 01-05-2022: The patient is very mindful of her blood sugars. States the lowest she has seen is 67 and the highest 160. Will continue to monitor.  05-04-2022: The patient states her lowest has been 98 and the highest has been 168. Denies any issues.     Reviewed scheduled/upcoming provider appointments including: 07-25-2022         Advised patient, providing education and rationale, to check cbg as directed and record.  09-08-2021: Review of blood sugars fasting <130 and post prandial of <180. States her range has been in 120's.  She is mindful of dietary restrictions as her husband is diabetic also. Will continue to monitor.    05-04-2022: The patient has a range of blood sugars 98 to 168. Knows goals call provider for findings outside established parameters;       Review of patient status, including review of consultants reports, relevant laboratory and other test results, and medications completed;     11-10-2021: Ask about diabetic shoes. The patient has a follow up appointment with pcp in February. The patient advised to discuss with the pcp about a script for diabetic shoes. She does check her feet daily for cuts and sores. Education and support given.      Hyperlipidemia:  (Status: Goal Met.)Goals met and care plan is being closed       Lab Results  Component Value Date    CHOL 172 01/21/2022    HDL 50 01/21/2022    LDLCALC 102 (H) 01/21/2022    TRIG 112 01/21/2022    CHOLHDL 3.4 01/21/2022      Medication review performed; medication list updated in electronic medical record. 01-05-2022: The patient had a [facility] and recommendations given for statin. The patient is currently not taking a statin. She sees the cardiologist in  April and will likely discuss then. Also sees the pcp in March. Education and support given. 03-02-2022: Is compliant with medications. Denies any new concerns with medications at this time. Will discuss coming off of Jardiance at next MD visit. 05-04-2022: The patient is compliant with medications. Denies any new concerns with HLD or heart health Provider established cholesterol goals reviewed. 05-04-2022: The patient is at goal. The patient states that she is doing well; Counseled on importance of regular laboratory monitoring as prescribed. 05-04-2022: Review of labs and the patient is currently at goal; Provided HLD educational materials; Reviewed role and benefits of statin for ASCVD risk reduction; Discussed strategies to manage statin-induced  myalgias; Reviewed importance of limiting foods high in cholesterol. 05-04-2022: Review of foods high in cholesterol. The patient does a good job of watching what she eats and maintaining her health and well being;   Hypertension: (Status: Goal Met.)Goals met and care plan is being closed  Last practice recorded BP readings:     BP Readings from Last 3 Encounters:  01/21/22 138/90  07/12/21 120/78  12/29/20 130/76  Most recent eGFR/CrCl:       Lab Results  Component Value Date    EGFR 52 (L) 01/21/2022    No components found for: CRCL   Evaluation of current treatment plan related to hypertension self management and patient's adherence to plan as established by provider. 11-10-2021: Denies any issues with HTN or heart health. Had her pacemaker placed and is doing well. No issues related to her pacemaker. She is mindful of factors that impact her health and well being. Will continue to monitor for changes or new needs. 01-05-2022: The patient is doing well and denies any new concerns related to HTN or heart health. Will see her cardiologist for follow up in April. 4-19-2023Clarnce Flock cardiologist on 03-01-2022. Is being scheduled for an Echocardiogram for evaluation and routine. She has not seen cardiologist sinc [phone]. She is scheduled to have her pacemaker changed out this year in 6 to 8 months. The patient states she feels she is is doing very well with management of her heart health. 05-04-2022: The patient is doing well with the management of her HTN and heart health. Saw cardiologist recently and new ECHO on 03/11/2022 with and EF of 50 to 55% noted. Her pacemaker  has approximately 11 months left but the cardiologist is going to schedule her to have a pacemaker replaced in October or November. The patient states she is doing well and denies any acute findings today;   Provided education to patient re: stroke prevention, s/s of heart attack and stroke. 05-04-2022: The patient is doing well and knows what  to look for in effective management of HTN. ; Reviewed prescribed diet heart healthy/ADA. 05-04-2022: Is compliant with heart healthy/ADA diet Reviewed medications with patient and discussed importance of compliance. 05-04-2022: Is compliant with medications. ;  Discussed plans with patient for ongoing care management follow up and provided patient with direct contact information for care management team; Advised patient, providing education and rationale, to monitor blood pressure daily and record, calling PCP for findings outside established parameters;  Reviewed scheduled/upcoming provider appointments including: 07-25-2022 Provided education on prescribed diet Heart healthy/ADA diet ;  Discussed complications of poorly controlled blood pressure such as heart disease, stroke, circulatory complications, vision complications, kidney impairment, sexual dysfunction;  Screening for signs and symptoms of depression related to chronic disease state;  Assessed social determinant of health barriers;     No further follow  up required. The patient has met the goals of care. Knows to call the [facility] for new needs or concerns  Please call the care guide team at [phone] if you need to cancel or reschedule your appointment.   If you are experiencing a Mental Health or [place] or need someone to talk to, please call the Suicide and Crisis Lifeline: 988 call the [place] National Suicide Prevention Lifeline: 269-142-6919 or TTY: (224)185-2890 TTY 947-096-6142) to talk to a trained counselor call 1-800-273-TALK (toll free, 24 hour hotline)   The patient verbalized understanding of instructions, educational materials, and care plan provided today and DECLINED offer to receive copy of patient instructions, educational materials, and care plan.   [name] RN, MSN, [facility] Mobile: [phone]

## 2022-05-04 NOTE — Chronic Care Management (AMB) (Cosign Needed)
Chronic Care Management   CCM RN Visit Note  05/04/2022 Name: Mary Webb MRN: [id number] DOB: Apr 14, 1956  Subjective: Mary Webb is a 66 y.o. year old female who is a primary care patient of [name], NP. The care management team was consulted for assistance with disease management and care coordination needs.    Engaged with patient by telephone for follow up visit in response to provider referral for case management and/or care coordination services.   Consent to Services:  The patient was given information about Chronic Care Management services, agreed to services, and gave verbal consent prior to initiation of services.  Please see initial visit note for detailed documentation.   Patient agreed to services and verbal consent obtained.   Assessment: Review of patient past medical history, allergies, medications, health status, including review of consultants reports, laboratory and other test data, was performed as part of comprehensive evaluation and provision of chronic care management services.   SDOH (Social Determinants of Health) assessments and interventions performed:    CCM Care Plan  Allergies  Allergen Reactions   Elemental Sulfur Anaphylaxis   Levaquin [Levofloxacin] Other (See Comments)    Stroke   Tekturna [Aliskiren] Shortness Of Breath   Accupril [Quinapril Hcl]    Aspirin    Diovan [Valsartan]    Erythromycin    Iodine    Latex     Can't remember reaction    Lotensin [Benazepril]    Norvasc [Amlodipine]    Shellfish Allergy Swelling   Sulfa Antibiotics    Tetanus Toxoids    Tetracycline     Other reaction(s): Unknown   Tetracyclines & Related    Penicillins Rash    Outpatient Encounter Medications as of 05/04/2022  Medication Sig   Accu-Chek Softclix Lancets lancets Use to check blood sugar 3 times a day and document for visits. Goal is <130 fasting and <180 two hours after meal.,   Ascorbic Acid (VITAMIN C) 1000 MG tablet  Take by mouth. Every other day   Blood Glucose Monitoring Suppl (ACCU-CHEK AVIVA PLUS) w/Device KIT Use to check blood sugar 3 times a day and document for visits.  Goal is <130 fasting and <180 two hours after meal.   chlorthalidone (HYGROTON) 25 MG tablet Take 1 tablet (25 mg total) by mouth daily.   Cholecalciferol (VITAMIN D3) 1000 units CAPS Take by mouth. Taking once a week   clopidogrel (PLAVIX) 75 MG tablet Take 1 tablet (75 mg total) by mouth daily.   Cyanocobalamin (VITAMIN B 12) 500 MCG TABS Take by mouth daily.   diclofenac Sodium (VOLTAREN) 1 % GEL Apply 2 g topically 4 (four) times daily.   fluticasone furoate-vilanterol (BREO ELLIPTA) 100-25 MCG/INH AEPB Inhale 1 puff into the lungs daily.   glucose blood (ACCU-CHEK AVIVA PLUS) test strip Use to check blood sugar 3 times a day and document for visits. Goal is <130 fasting and <180 two hours after meal.,   JARDIANCE 10 MG TABS tablet Take 1 tablet (10 mg total) by mouth daily before breakfast.   losartan (COZAAR) 100 MG tablet Take 1 tablet (100 mg total) by mouth daily.   potassium chloride SA (KLOR-CON M) 20 MEQ tablet Take 1 tablet (20 mEq total) by mouth 2 (two) times daily.   pyridOXINE (VITAMIN B-6) 25 MG tablet Take by mouth daily.    No facility-administered encounter medications on file as of 05/04/2022.    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/28/2015   Pituitary adenoma ([place]) 04/28/2015   History of CVA (cerebrovascular accident) 04/28/2015   Morbid obesity ([place]) 04/28/2015   Hyperlipidemia 04/28/2015   Stroke ([place]) 04/28/2015    Conditions to be addressed/monitored:HTN, HLD, Pre-DM and CKD Stage 3  Care Plan : RNCM; General Plan of Care (Adult) for Chronic Disease  Management and Care Coordination Needs  Updates made by [name], RN since 05/04/2022 12:00 AM     Problem: RNCM: Development of Plan of Care for Chronic Disease Management and Care coordination Needs( HTN, HLD, CKD3, Pre-DM2)   Priority: High     Long-Range Goal: RNCM: Effective management  of Plan of Care for Chronic Disease Management and Care coordination Needs( HTN, HLD, CKD3, Pre-DM2) Completed 05/04/2022  Start Date: 09/08/2021  Expected End Date: 09/08/2022  Priority: High  Note:   Current Barriers: : 05-04-2022: Goals met and care plan is being closed. The patient has completed the plan of care. Knows to call for changes or new needs Knowledge Deficits related to plan of care for management of HTN, HLD, DMII, and CKD Stage 3  Chronic Disease Management support and education needs related to HTN, HLD, DMII, and CKD Stage 3  RNCM Clinical Goal(s):  Patient will verbalize understanding of plan for management of HTN, HLD, DMII, and CKD Stage 3  verbalize basic understanding of HTN, HLD, DMII, and CKD Stage 3 disease process and self health management plan  take all medications exactly as prescribed and will call provider for medication related questions demonstrate understanding of rationale for each prescribed medication  attend all scheduled medical appointments: 07-25-2022, reminder given today demonstrate improved and ongoing adherence to prescribed treatment plan for HTN, HLD, DMII, and CKD Stage 3 as evidenced by daily monitoring and recording of CBG  adherence to ADA/ carb modified diet adherence to prescribed medication regimen contacting provider for new or worsened symptoms or questions  demonstrate improved and ongoing health management independence  demonstrate a decrease in HTN, HLD, DMII, and CKD Stage 3 exacerbations  demonstrate ongoing self health care management ability effective management of chronic conditions through collaboration with RN Care manager, provider,  and care team.   Interventions: 1:1 collaboration with primary care provider regarding development and update of comprehensive plan of care as evidenced by provider attestation and co-signature Inter-disciplinary care team collaboration (see longitudinal plan of care) Evaluation of current treatment plan related to  self management and patient's adherence to plan as established by provider   SDOH Barriers (Status: Goal Met.) Long Term Goal (>30 days) Goals met and care plan is being closed  Patient interviewed and SDOH assessment performed        SDOH Interventions    Flowsheet Row Most Recent Value  SDOH Interventions   Food Insecurity Interventions Intervention Not Indicated  Financial Strain Interventions Intervention Not Indicated  Housing Interventions Intervention Not Indicated  Intimate Partner Violence Interventions Intervention Not Indicated  Physical Activity Interventions Intervention Not Indicated  Stress Interventions Intervention Not Indicated  Social Connections Interventions Intervention Not Indicated  Transportation Interventions Intervention Not Indicated     Patient interviewed and appropriate assessments performed Provided patient with information about resources available for the patient in [facility] and care guides available to address needs as they arise related to SDOH Discussed plans with patient for ongoing care  management follow up and provided patient with direct contact information for care management team Advised patient to call the office for changes in SDOH, questions or concerns    Chronic Kidney Disease (Status: Goal Met.) 05-04-2022: Goals met and care plan is being closed  Last practice recorded BP readings:  BP Readings from Last 3 Encounters:  01/21/22 138/90  07/12/21 120/78  12/29/20 130/76  Most recent eGFR/CrCl:  Lab Results  Component Value Date   EGFR 52 (L) 01/21/2022    No components found for: CRCL  Assessed the patient      understanding of chronic kidney disease. 05-04-2022: The patient is stable and doing well. Sees providers on a regular basis. Knows to call for changes or new needs.    Evaluation of current treatment plan related to chronic kidney disease self management and patient's adherence to plan as established by provider. 09-08-2021: The patient has started taking Jardiance, education and support given. The patient states she has been peeing a lot but the pharmacist told her that should subside. The patient states the cost is a little high but she is managing. Knows to call the CCM team for changes or needs related to CKD and effective management.   11-10-2021: The patient is doing well and denies any changes in her kidney health. She states that she had her pacemaker placed recently and is not having any issues. Will continue to monitor. 05-04-2022: The patient is doing well and denies any issues related to kidney function. Discussed dietary restrictions and other factors that impact her kidney health. The patient is compliant with medications and plan of care. Kidney function actually improved. Provided education to patient re: stroke prevention, s/s of heart attack and stroke    Reviewed prescribed diet heart healthy/ADA diet. 05-04-2022: Is compliant with heart healthy/ADA diet  Reviewed medications with patient and discussed importance of compliance. 09-08-2021: Has been taking Jardiance since the end of August.   03-02-2022: Is compliant with medications. The patient states she may talk to the provider about stopping Jardiance. 05-04-2022: Is compliant with medications. Denies any needs at this time Advised patient, providing education and rationale, to monitor blood pressure daily and record, calling PCP for findings outside established parameters    Discussed complications of poorly controlled blood pressure such as heart disease, stroke, circulatory complications, vision complications, kidney impairment, sexual  dysfunction    Reviewed scheduled/upcoming provider appointments including: 07-25-2022, reminder given today  Advised patient to discuss treatment options and recommendations for controlling progression of CKD with provider    Discussed plans with patient for ongoing care management follow up and provided patient with direct contact information for care management team    Screening for signs and symptoms of depression related to chronic disease state      Discussed the impact of chronic kidney disease on daily life and mental health and acknowledged and normalized feelings of disempowerment, fear, and frustration    Assessed social determinant of health barriers    Provided education on kidney disease progression    Engage patient in early, proactive and ongoing discussion about goals of care and what matters most to them    Support coping and stress management by recognizing current strategies and assist in developing new strategies such as mindfulness, journaling, relaxation techniques, problem-solving      Diabetes:  (Status: Goal Met.) 05-04-2022: Goals met and care plan is being closed  Lab Results  Component Value Date   HGBA1C 6.4 (H) 01/21/2022  Assessed patient's  understanding of A1c goal: <7%. 05-04-2022: The patient is under goal Provided education to patient about basic DM disease process. 05-04-2022: The patient has a good understanding of DM and how to effectively manage. Her husband is also diabetic and she cooks healthy meals for them on a regular basis.; Reviewed medications with patient and discussed importance of medication adherence. 09-08-2021: The patient is taking Jardiance. Is "peeing a lot", but denies any acute distress. States cost is a little high but she is managing. 05-04-2022: States compliance with medications and blood sugar range is good. ;        Reviewed prescribed diet with patient heart healthy/ADA diet. 05-04-2022: The patient does a lot of cooking. Her husband is  diabetic and she fixes a lot of vegetables and salads for them.  She is mindful of watching sodium, fats, and sugars. Likes herbs and cooking with organic products. Review and education provided; Counseled on importance of regular laboratory monitoring as prescribed. 05-04-2022 has on a regular basis. ;        Discussed plans with patient for ongoing care management follow up and provided patient with direct contact information for care management team;      Provided patient with written educational materials related to hypo and hyperglycemia and importance of correct treatment. 09-08-2021: Review of sx and sx. The patient has been to an education class with her husband and is aware of factors that cause blood sugars to change. States her blood sugars have been in 120's. 01-05-2022: The patient is very mindful of her blood sugars. States the lowest she has seen is 67 and the highest 160. Will continue to monitor.  05-04-2022: The patient states her lowest has been 98 and the highest has been 168. Denies any issues.     Reviewed scheduled/upcoming provider appointments including: 07-25-2022         Advised patient, providing education and rationale, to check cbg as directed and record.  09-08-2021: Review of blood sugars fasting <130 and post prandial of <180. States her range has been in 120's. She is mindful of dietary restrictions as her husband is diabetic also. Will continue to monitor.    05-04-2022: The patient has a range of blood sugars 98 to 168. Knows goals call provider for findings outside established parameters;       Review of patient status, including review of consultants reports, relevant laboratory and other test results, and medications completed;     11-10-2021: Ask about diabetic shoes. The patient has a follow up appointment with pcp in February. The patient advised to discuss with the pcp about a script for diabetic shoes. She does check her feet daily for cuts and sores. Education and  support given.     Hyperlipidemia:  (Status: Goal Met.)Goals met and care plan is being closed  Lab Results  Component Value Date   CHOL 172 01/21/2022   HDL 50 01/21/2022   LDLCALC 102 (H) 01/21/2022   TRIG 112 01/21/2022   CHOLHDL 3.4 01/21/2022     Medication review performed; medication list updated in electronic medical record. 01-05-2022: The patient had a [facility] of [facility] and recommendations given for statin. The patient is currently not taking a statin. She sees the cardiologist in April and will likely discuss then. Also sees the pcp in March. Education and support given. 03-02-2022: Is compliant with medications. Denies any new concerns with medications at this time. Will discuss coming off of Jardiance at next MD visit. 05-04-2022:  The patient is compliant with medications. Denies any new concerns with HLD or heart health Provider established cholesterol goals reviewed. 05-04-2022: The patient is at goal. The patient states that she is doing well; Counseled on importance of regular laboratory monitoring as prescribed. 05-04-2022: Review of labs and the patient is currently at goal; Provided HLD educational materials; Reviewed role and benefits of statin for ASCVD risk reduction; Discussed strategies to manage statin-induced myalgias; Reviewed importance of limiting foods high in cholesterol. 05-04-2022: Review of foods high in cholesterol. The patient does a good job of watching what she eats and maintaining her health and well being;  Hypertension: (Status: Goal Met.)Goals met and care plan is being closed  Last practice recorded BP readings:  BP Readings from Last 3 Encounters:  01/21/22 138/90  07/12/21 120/78  12/29/20 130/76  Most recent eGFR/CrCl:  Lab Results  Component Value Date   EGFR 52 (L) 01/21/2022    No components found for: CRCL  Evaluation of current treatment plan related to hypertension self management and patient's adherence to plan  as established by provider. 11-10-2021: Denies any issues with HTN or heart health. Had her pacemaker placed and is doing well. No issues related to her pacemaker. She is mindful of factors that impact her health and well being. Will continue to monitor for changes or new needs. 01-05-2022: The patient is doing well and denies any new concerns related to HTN or heart health. Will see her cardiologist for follow up in April. 4-19-2023Clarnce Flock cardiologist on 03-01-2022. Is being scheduled for an Echocardiogram for evaluation and routine. She has not seen cardiologist sinc [phone]. She is scheduled to have her pacemaker changed out this year in 6 to 8 months. The patient states she feels she is is doing very well with management of her heart health. 05-04-2022: The patient is doing well with the management of her HTN and heart health. Saw cardiologist recently and new ECHO on 03/11/2022 with and EF of 50 to 55% noted. Her pacemaker  has approximately 11 months left but the cardiologist is going to schedule her to have a pacemaker replaced in October or November. The patient states she is doing well and denies any acute findings today;   Provided education to patient re: stroke prevention, s/s of heart attack and stroke. 05-04-2022: The patient is doing well and knows what to look for in effective management of HTN. ; Reviewed prescribed diet heart healthy/ADA. 05-04-2022: Is compliant with heart healthy/ADA diet Reviewed medications with patient and discussed importance of compliance. 05-04-2022: Is compliant with medications. ;  Discussed plans with patient for ongoing care management follow up and provided patient with direct contact information for care management team; Advised patient, providing education and rationale, to monitor blood pressure daily and record, calling PCP for findings outside established parameters;  Reviewed scheduled/upcoming provider appointments including: 07-25-2022 Provided education on  prescribed diet Heart healthy/ADA diet ;  Discussed complications of poorly controlled blood pressure such as heart disease, stroke, circulatory complications, vision complications, kidney impairment, sexual dysfunction;  Screening for signs and symptoms of depression related to chronic disease state;  Assessed social determinant of health barriers;   Patient Goals/Self-Care Activities: Patient will self administer medications as prescribed as evidenced by self report/primary caregiver report  Patient will attend all scheduled provider appointments as evidenced by clinician review of documented attendance to scheduled appointments and patient/caregiver report Patient will call pharmacy for medication refills as evidenced by patient report and review of pharmacy fill history  as appropriate Patient will attend church or other social activities as evidenced by patient report Patient will continue to perform ADL's independently as evidenced by patient/caregiver report Patient will continue to perform IADL's independently as evidenced by patient/caregiver report Patient will call provider office for new concerns or questions as evidenced by review of documented incoming telephone call notes and patient report Patient will work with BSW to address care coordination needs and will continue to work with the clinical team to address health care and disease management related needs as evidenced by documented adherence to scheduled care management/care coordination appointments - schedule appointment with eye doctor - check blood sugar at prescribed times: twice daily - check feet daily for cuts, sores or redness - enter blood sugar readings and medication or insulin into daily log - take the blood sugar log to all doctor visits - trim toenails straight across - drink 6 to 8 glasses of water each day - eat fish at least once per week - fill half of plate with vegetables - limit fast food meals to no more  than 1 per week - manage portion size - prepare main meal at home 3 to 5 days each week - read food labels for fat, fiber, carbohydrates and portion size - reduce red meat to 2 to 3 times a week - set a realistic goal - keep feet up while sitting - wash and dry feet carefully every day - wear comfortable, cotton socks - wear comfortable, well-fitting shoes - check blood pressure 3 times per week - choose a place to take my blood pressure (home, clinic or office, retail store) - write blood pressure results in a log or diary - learn about high blood pressure - keep a blood pressure log - take blood pressure log to all doctor appointments - call doctor for signs and symptoms of high blood pressure - develop an action plan for high blood pressure - keep all doctor appointments - take medications for blood pressure exactly as prescribed - report new symptoms to your doctor - eat more whole grains, fruits and vegetables, lean meats and healthy fats - call for medicine refill 2 or 3 days before it runs out - take all medications exactly as prescribed - call doctor with any symptoms you believe are related to your medicine - call doctor when you experience any new symptoms - go to all doctor appointments as scheduled - adhere to prescribed diet: heart healthy/ADA diet        Plan:No further follow up required: The patient has met the goals of care and the care plan has been closes. The patient knows to call the [facility] for new concerns or needs  [name] RN, MSN, [facility] Mobile: [phone]

## 2022-05-09 ENCOUNTER — Ambulatory Visit
Admission: RE | Admit: 2022-05-09 | Discharge: 2022-05-09 | Disposition: A | Discharge status: Home / Self Care | Payer: Medicare Other | Source: Ambulatory Visit | Attending: Nurse Practitioner | Admitting: Nurse Practitioner

## 2022-05-09 ENCOUNTER — Other Ambulatory Visit: Payer: Self-pay | Admitting: Nurse Practitioner

## 2022-05-09 DIAGNOSIS — Z1231 Encounter for screening mammogram for malignant neoplasm of breast: Secondary | ICD-10-CM

## 2022-05-09 DIAGNOSIS — Z13820 Encounter for screening for osteoporosis: Secondary | ICD-10-CM | POA: Insufficient documentation

## 2022-05-09 DIAGNOSIS — Z780 Asymptomatic menopausal state: Secondary | ICD-10-CM | POA: Diagnosis not present

## 2022-05-09 DIAGNOSIS — Z0389 Encounter for observation for other suspected diseases and conditions ruled out: Secondary | ICD-10-CM | POA: Diagnosis not present

## 2022-05-09 DIAGNOSIS — Z8673 Personal history of transient ischemic attack (TIA), and cerebral infarction without residual deficits: Secondary | ICD-10-CM | POA: Insufficient documentation

## 2022-05-13 DIAGNOSIS — E7800 Pure hypercholesterolemia, unspecified: Secondary | ICD-10-CM

## 2022-05-13 DIAGNOSIS — I10 Essential (primary) hypertension: Secondary | ICD-10-CM

## 2022-05-13 DIAGNOSIS — I129 Hypertensive chronic kidney disease with stage 1 through stage 4 chronic kidney disease, or unspecified chronic kidney disease: Secondary | ICD-10-CM

## 2022-05-13 DIAGNOSIS — N183 Chronic kidney disease, stage 3 unspecified: Secondary | ICD-10-CM

## 2022-06-08 DIAGNOSIS — H1045 Other chronic allergic conjunctivitis: Secondary | ICD-10-CM | POA: Diagnosis not present

## 2022-06-08 DIAGNOSIS — H35033 Hypertensive retinopathy, bilateral: Secondary | ICD-10-CM | POA: Diagnosis not present

## 2022-06-08 DIAGNOSIS — H40013 Open angle with borderline findings, low risk, bilateral: Secondary | ICD-10-CM | POA: Diagnosis not present

## 2022-06-08 DIAGNOSIS — D352 Benign neoplasm of pituitary gland: Secondary | ICD-10-CM | POA: Diagnosis not present

## 2022-07-07 ENCOUNTER — Other Ambulatory Visit: Payer: Self-pay | Admitting: Nurse Practitioner

## 2022-07-07 DIAGNOSIS — I6322 Cerebral infarction due to unspecified occlusion or stenosis of basilar arteries: Secondary | ICD-10-CM

## 2022-07-07 DIAGNOSIS — I10 Essential (primary) hypertension: Secondary | ICD-10-CM

## 2022-07-07 DIAGNOSIS — I495 Sick sinus syndrome: Secondary | ICD-10-CM

## 2022-07-07 DIAGNOSIS — I129 Hypertensive chronic kidney disease with stage 1 through stage 4 chronic kidney disease, or unspecified chronic kidney disease: Secondary | ICD-10-CM

## 2022-07-07 NOTE — Telephone Encounter (Signed)
Requested Prescriptions  Pending Prescriptions Disp Refills  . chlorthalidone (HYGROTON) 25 MG tablet [Pharmacy Med Name: CHLORTHALIDONE 25 MG TABLET] 90 tablet 0    Sig: Take 1 tablet (25 mg total) by mouth daily.     Cardiovascular: Diuretics - Thiazide Failed - 07/07/2022 11:07 AM      Failed - Cr in normal range and within 180 days    Creatinine  Date Value Ref Range Status  07/12/2014 1.37 (H) 0.60 - 1.30 mg/dL Final   Creatinine, Ser  Date Value Ref Range Status  01/21/2022 1.16 (H) 0.57 - 1.00 mg/dL Final         Failed - Last BP in normal range    BP Readings from Last 1 Encounters:  01/21/22 138/90         Passed - K in normal range and within 180 days    Potassium  Date Value Ref Range Status  01/21/2022 3.7 3.5 - 5.2 mmol/L Final  07/12/2014 3.4 (L) 3.5 - 5.1 mmol/L Final         Passed - Na in normal range and within 180 days    Sodium  Date Value Ref Range Status  01/21/2022 138 134 - 144 mmol/L Final  07/12/2014 137 136 - 145 mmol/L Final         Passed - Valid encounter within last 6 months    Recent Outpatient Visits          5 months ago Annual physical exam   [facility] [name], NP   12 months ago Hypertensive kidney disease with stage 3b chronic kidney disease ([place])   [place], Karen, NP   1 year ago Annual physical exam   [facility] [name], NP   2 years ago Hypertensive kidney disease with stage 3 chronic kidney disease, unspecified whether stage 3a or 3b CKD   [place], Rachel Elizabeth, [place]   2 years ago Hypertensive kidney disease with stage 3 chronic kidney disease, unspecified whether stage 3a or 3b CKD   [place], Rachel Elizabeth, [place]      Future Appointments            In 2 weeks [name], NP [facility], [place]           . clopidogrel (PLAVIX) 75 MG tablet [Pharmacy Med Name: CLOPIDOGREL 75 MG  TABLET] 90 tablet 0    Sig: Take 1 tablet (75 mg total) by mouth daily.     Hematology: Antiplatelets - clopidogrel Failed - 07/07/2022 11:07 AM      Failed - Cr in normal range and within 360 days    Creatinine  Date Value Ref Range Status  07/12/2014 1.37 (H) 0.60 - 1.30 mg/dL Final   Creatinine, Ser  Date Value Ref Range Status  01/21/2022 1.16 (H) 0.57 - 1.00 mg/dL Final         Passed - HCT in normal range and within 180 days    Hematocrit  Date Value Ref Range Status  01/21/2022 43.8 34.0 - 46.6 % Final         Passed - HGB in normal range and within 180 days    Hemoglobin  Date Value Ref Range Status  01/21/2022 14.9 11.1 - 15.9 g/dL Final         Passed - PLT in normal range and within 180 days    Platelets  Date Value Ref Range Status  01/21/2022 311  150 - 450 x10E3/uL Final         Passed - Valid encounter within last 6 months    Recent Outpatient Visits          5 months ago Annual physical exam   [facility] [name], NP   12 months ago Hypertensive kidney disease with stage 3b chronic kidney disease ([place])   [place] [name], NP   1 year ago Annual physical exam   [facility] [name], NP   2 years ago Hypertensive kidney disease with stage 3 chronic kidney disease, unspecified whether stage 3a or 3b CKD   [facility] Volney American, [place]   2 years ago Hypertensive kidney disease with stage 3 chronic kidney disease, unspecified whether stage 3a or 3b CKD   [facility] Volney American, [place]      Future Appointments            In 2 weeks [name], NP [facility], [place]

## 2022-07-13 DIAGNOSIS — I495 Sick sinus syndrome: Secondary | ICD-10-CM | POA: Diagnosis not present

## 2022-07-13 DIAGNOSIS — E7800 Pure hypercholesterolemia, unspecified: Secondary | ICD-10-CM | POA: Diagnosis not present

## 2022-07-13 DIAGNOSIS — R7303 Prediabetes: Secondary | ICD-10-CM | POA: Diagnosis not present

## 2022-07-13 DIAGNOSIS — I10 Essential (primary) hypertension: Secondary | ICD-10-CM | POA: Diagnosis not present

## 2022-07-13 DIAGNOSIS — Z8673 Personal history of transient ischemic attack (TIA), and cerebral infarction without residual deficits: Secondary | ICD-10-CM | POA: Diagnosis not present

## 2022-07-13 DIAGNOSIS — N1832 Chronic kidney disease, stage 3b: Secondary | ICD-10-CM | POA: Diagnosis not present

## 2022-07-20 ENCOUNTER — Emergency Department
Admission: EM | Admit: 2022-07-20 | Discharge: 2022-07-20 | Disposition: A | Discharge status: Home / Self Care | Payer: Medicare Other | Attending: Emergency Medicine | Admitting: Emergency Medicine

## 2022-07-20 ENCOUNTER — Ambulatory Visit: Payer: Self-pay | Admitting: *Deleted

## 2022-07-20 ENCOUNTER — Encounter: Payer: Self-pay | Admitting: Emergency Medicine

## 2022-07-20 ENCOUNTER — Emergency Department: Payer: Medicare Other

## 2022-07-20 ENCOUNTER — Other Ambulatory Visit: Payer: Self-pay

## 2022-07-20 DIAGNOSIS — M545 Low back pain, unspecified: Secondary | ICD-10-CM | POA: Diagnosis not present

## 2022-07-20 DIAGNOSIS — I2510 Atherosclerotic heart disease of native coronary artery without angina pectoris: Secondary | ICD-10-CM | POA: Insufficient documentation

## 2022-07-20 DIAGNOSIS — I129 Hypertensive chronic kidney disease with stage 1 through stage 4 chronic kidney disease, or unspecified chronic kidney disease: Secondary | ICD-10-CM | POA: Insufficient documentation

## 2022-07-20 DIAGNOSIS — M5431 Sciatica, right side: Secondary | ICD-10-CM | POA: Diagnosis not present

## 2022-07-20 DIAGNOSIS — N183 Chronic kidney disease, stage 3 unspecified: Secondary | ICD-10-CM | POA: Diagnosis not present

## 2022-07-20 DIAGNOSIS — M4316 Spondylolisthesis, lumbar region: Secondary | ICD-10-CM | POA: Diagnosis not present

## 2022-07-20 DIAGNOSIS — M5441 Lumbago with sciatica, right side: Secondary | ICD-10-CM | POA: Diagnosis not present

## 2022-07-20 DIAGNOSIS — Z743 Need for continuous supervision: Secondary | ICD-10-CM | POA: Diagnosis not present

## 2022-07-20 DIAGNOSIS — R6889 Other general symptoms and signs: Secondary | ICD-10-CM | POA: Diagnosis not present

## 2022-07-20 LAB — URINALYSIS, ROUTINE W REFLEX MICROSCOPIC
Bilirubin Urine: NEGATIVE
Glucose, UA: NEGATIVE mg/dL
Hgb urine dipstick: NEGATIVE
Ketones, ur: NEGATIVE mg/dL
Leukocytes,Ua: NEGATIVE
Nitrite: NEGATIVE
Protein, ur: NEGATIVE mg/dL
Specific Gravity, Urine: 1.008 (ref 1.005–1.030)
pH: 6 (ref 5.0–8.0)

## 2022-07-20 MED ORDER — HYDROCODONE-ACETAMINOPHEN 5-325 MG PO TABS
1.0000 | ORAL_TABLET | Freq: Once | ORAL | Status: AC
  Administered 2022-07-20: 1 via ORAL
  Filled 2022-07-20: qty 1

## 2022-07-20 MED ORDER — LIDOCAINE 5 % EX PTCH: 1.0000 | MEDICATED_PATCH | CUTANEOUS | 0 refills | Status: DC

## 2022-07-20 MED ORDER — GABAPENTIN 100 MG PO CAPS: 100.0000 mg | ORAL_CAPSULE | Freq: Three times a day (TID) | ORAL | 0 refills | Status: DC

## 2022-07-20 NOTE — Telephone Encounter (Signed)
  Chief Complaint: leg pain Symptoms: R leg pain, swelling, unable to walk Frequency: over 1 week Pertinent Negatives: Patient denies  back pain, breathing difficulty, swelling, rash, fever, numbness, weakness) Disposition: '[x]'$ ED /'[]'$ Urgent Care (no appt availability in office) / '[]'$ Appointment(In office/virtual)/ '[]'$  [place] [facility]/ '[]'$ Home Care/ '[]'$ Refused Recommended Disposition /'[]'$ [place] [facility]/ '[]'$  Follow-up with PCP Additional Notes: Advised ED- unsure if patient will go

## 2022-07-20 NOTE — Discharge Instructions (Addendum)
-  You may continue to take acetaminophen as needed for your sciatica.  Utilize gabapentin sparingly.  You may additionally utilize lidocaine patches as needed.  -Please follow-up with the orthopedist listed in these instructions if your symptoms fail to improve after 1 to 2 weeks.  You may call the number listed in these instructions today to schedule an appointment.  -Return to the emergency department anytime if you begin to experience any new or worsening symptoms.

## 2022-07-20 NOTE — ED Notes (Signed)
Dc instructions and scripts reviewed with pt no questions or concerns at this time.  

## 2022-07-20 NOTE — ED Provider Notes (Signed)
[facility] Provider Note    Event Date/Time   First MD Initiated Contact with Patient 07/20/22 1107     (approximate)   History   Chief Complaint Sciatica   HPI Mary Webb is a 66 y.o. female, with hypertension, morbid obesity, sick sinus syndrome, CKD stage III, CAD, prior CVA, arthritis, presents to the emergency department for evaluation of suspected sciatica.  Patient states that for the past 2 weeks she has had increased pain in her buttocks radiating down to her thigh and ankle/foot.  Described as a sharp, stinging type sensation that is exacerbated with movements.  She states that she is still able to walk, though not very well due to the pain.  She has not had any recent falls or injuries.  No history of prior back problems.  Denies fever/chills, chest pain, shortness of breath, abdominal pain, bowel/bladder dysfunction, numbness/tingling in upper extremities or in the left lower extremity, saddle anesthesia, rashes/lesions, or dizziness/lightheadedness.  History Limitations: No limitations.        Physical Exam  Triage Vital Signs: ED Triage Vitals  Enc Vitals Group     BP 07/20/22 1028 (!) 100/42     Pulse Rate 07/20/22 1028 63     Resp 07/20/22 1028 16     Temp 07/20/22 1028 98.3 F (36.8 C)     Temp Source 07/20/22 1028 Oral     SpO2 07/20/22 1028 93 %     Weight --      Height --      Head Circumference --      Peak Flow --      Pain Score 07/20/22 1026 5     Pain Loc --      Pain Edu? --      Excl. in [place]? --     Most recent vital signs: Vitals:   07/20/22 1028 07/20/22 1458  BP: (!) 100/42 (!) 112/56  Pulse: 63 68  Resp: 16 18  Temp: 98.3 F (36.8 C) 98.3 F (36.8 C)  SpO2: 93% 94%    General: Awake, appears uncomfortable. Skin: Warm, dry. No rashes or lesions.  Eyes: PERRL. Conjunctivae normal.  CV: Good peripheral perfusion.  Resp: Normal effort.  Abd: Soft, non-tender. No distention.  Neuro: At baseline.  No gross neurological deficits.  Musculoskeletal: Normal ROM of all extremities.   Focused Exam: No gross deformities to the right lower extremity.  Positive straight leg test.  PMS intact distally.  Physical Exam    ED Results / Procedures / Treatments  Labs (all labs ordered are listed, but only abnormal results are displayed) Labs Reviewed  URINALYSIS, ROUTINE W REFLEX MICROSCOPIC - Abnormal; Notable for the following components:      Result Value   Color, Urine AMBER (*)    APPearance CLEAR (*)    All other components within normal limits     EKG N/A.   RADIOLOGY  ED Provider Interpretation: I personally viewed and interpreted the CT scan, no acute findings.  CT Lumbar Spine Wo Contrast  Result Date: 07/20/2022 CLINICAL DATA:  Pain in right buttock traveling down right leg. Three weeks. EXAM: CT LUMBAR SPINE WITHOUT CONTRAST TECHNIQUE: Multidetector CT imaging of the lumbar spine was performed without intravenous contrast administration. Multiplanar CT image reconstructions were also generated. RADIATION DOSE REDUCTION: This exam was performed according to the departmental dose-optimization program which includes automated exposure control, adjustment of the mA and/or kV according to patient size and/or use of iterative  reconstruction technique. COMPARISON:  CT abdomen/pelvis 07/13/2014 FINDINGS: Segmentation: Standard; the lowest formed disc space is designated L5-S1. Alignment: There is trace anterolisthesis of L4 on L5. Alignment is otherwise normal. Vertebrae: Vertebral body heights are preserved. There is no evidence of acute fracture. There is a small Schmorl's node indenting the superior L2 endplate. There is no suspicious osseous lesion. There are prominent anterior osteophytes in the lower thoracic spine. Paraspinal and other soft tissues: There is a cystic lesion in the right pelvis measuring up to at least 5.1 cm, incompletely imaged. This lesion was present on the CT  abdomen/pelvis from 2015. Paraspinal soft tissues are unremarkable. Disc levels: There is overall mild multilevel disc space narrowing. T12-L1: No significant spinal canal or neural foraminal stenosis L1-L2: No significant spinal canal or neural foraminal stenosis L2-L3: There is a mild disc bulge eccentric to the right without significant spinal canal or neural foraminal stenosis L3-L4: There is a mild disc bulge without significant spinal canal or neural foraminal stenosis L4-L5: There is trace anterolisthesis with a mild disc bulge and moderate bilateral facet arthropathy with ligamentum flavum thickening resulting in moderate spinal canal stenosis and mild left worse than right neural foraminal stenosis. L5-S1: Degenerative endplate change and advanced bilateral facet arthropathy resulting in mild bilateral neural foraminal stenosis without significant spinal canal stenosis. IMPRESSION: 1. No acute finding in the lumbar spine. 2. Trace anterolisthesis at L4-L5 with associated disc bulge and facet arthropathy resulting in moderate spinal canal stenosis and mild left worse than right neural foraminal stenosis. 3. Advanced facet arthropathy at L5-S1 with mild bilateral neural foraminal stenosis. 4. Incompletely imaged cystic lesion in the right pelvis measuring at least 5.1 cm, incompletely imaged but also present in 2015, likely benign given stability. Electronically Signed   By: Valetta Mole M.D.   On: 07/20/2022 12:09    PROCEDURES:  Critical Care performed: N/A.  Procedures    MEDICATIONS ORDERED IN ED: Medications  HYDROcodone-acetaminophen (NORCO/VICODIN) 5-325 MG per tablet 1 tablet (1 tablet Oral Given 07/20/22 1135)     IMPRESSION / MDM / ASSESSMENT AND PLAN / ED COURSE  I reviewed the triage vital signs and the nursing notes.                              Differential diagnosis includes, but is not limited to, lumbosacral strain, lumbar radiculopathy, sciatica, compression fracture, disc  herniation.  Assessment/Plan Patient presents with pain in the right lower extremity x3 weeks.  Her history and physical exam is highly suggestive of sciatica type pain.  No signs of cauda equina syndrome.  Urinalysis shows no signs of infection.  Her lumbar CT does show trace anterolisthesis at L4/L5 with associated disc bulge and advanced facet arthropathy at L5/S1.  See above for details.  I suspect this is likely the etiology of the patient's radiculopathy.  She appears clinically stable at this time.  She still able to ambulate on her own.  Many of her comorbidities prevent standard treatments for sciatica.  We will treat with gabapentin and lidocaine patches for now.  Encouraged her to follow-up with orthopedics.  Provided her with a orthopedics referral, as requested.  Will discharge.  Provided the patient with anticipatory guidance, return precautions, and educational material. Encouraged the patient to return to the emergency department at any time if they begin to experience any new or worsening symptoms. Patient expressed understanding and agreed with the plan.   Patient's presentation  is most consistent with acute complicated illness / injury requiring diagnostic workup.       FINAL CLINICAL IMPRESSION(S) / ED DIAGNOSES   Final diagnoses:  Sciatica of right side     Rx / DC Orders   ED Discharge Orders          Ordered    gabapentin (NEURONTIN) 100 MG capsule  3 times daily        07/20/22 1316    lidocaine (LIDODERM) 5 %  Every 24 hours        07/20/22 1316             Note:  This document was prepared using Dragon voice recognition software and may include unintentional dictation errors.   Teodoro Spray, [place] 07/20/22 1617    Ysabelle Lincoln, MD 07/21/22 [phone]

## 2022-07-20 NOTE — ED Triage Notes (Signed)
Pt to ED via [facility] from home for Pain in her right buttock that travels down her leg. Pt states that this has been on for 3 weeks. Pt states that she called her PCP and they advised her to come to the ED. Pt states that it is painful to put weight on her right leg. Pt is in NAD.

## 2022-07-20 NOTE — Telephone Encounter (Signed)
Reason for Disposition  Unable to walk  Answer Assessment - Initial Assessment Questions 1. ONSET: "When did the pain start?"      Started days- over 1 week 2. LOCATION: "Where is the pain located?"      R- buttock down to calf 3. PAIN: "How bad is the pain?"    (Scale 1-10; or mild, moderate, severe)   -  MILD (1-3): doesn't interfere with normal activities    -  MODERATE (4-7): interferes with normal activities (e.g., work or school) or awakens from sleep, limping    -  SEVERE (8-10): excruciating pain, unable to do any normal activities, unable to walk     Moderate/severe 4. WORK OR EXERCISE: "Has there been any recent work or exercise that involved this part of the body?"      *No Answer* 5. CAUSE: "What do you think is causing the leg pain?"     unsure 6. OTHER SYMPTOMS: "Do you have any other symptoms?" (e.g., chest pain, back pain, breathing difficulty, swelling, rash, fever, numbness, weakness)     Swollen leg 7. PREGNANCY: "Is there any chance you are pregnant?" "When was your last menstrual period?"     *No Answer*  Protocols used: Leg Pain-A-AH

## 2022-07-22 ENCOUNTER — Ambulatory Visit: Payer: Self-pay

## 2022-07-22 NOTE — Telephone Encounter (Signed)
Patient will have to have a visit.

## 2022-07-22 NOTE — Telephone Encounter (Signed)
Patient has voiced understanding.

## 2022-07-22 NOTE — Progress Notes (Unsigned)
There were no vitals taken for this visit.   Subjective:    Patient ID: [name], female    DOB: 01/01/1956, 66 y.o.   MRN: [id number]  HPI: KESTREL MIS is a 66 y.o. female  No chief complaint on file.  HYPERTENSION / HYPERLIPIDEMIA Satisfied with current treatment? no Duration of hypertension: years BP monitoring frequency: daily BP range: 120/80 BP medication side effects: no Past BP meds: chlorthalidone and losartan (cozaar) Duration of hyperlipidemia: years Cholesterol medication side effects: no Cholesterol supplements: none Past cholesterol medications: none Medication compliance: excellent compliance Aspirin: no Recent stressors: no Recurrent headaches: no Visual changes: no Palpitations: no Dyspnea: SOB- had COVID about 1 month ago. Chest pain: no Lower extremity edema: no Dizzy/lightheaded: no     Relevant past medical, surgical, family and social history reviewed and updated as indicated. Interim medical history since our last visit reviewed. Allergies and medications reviewed and updated.  Review of Systems  Eyes:  Negative for visual disturbance.  Respiratory:  Negative for cough, chest tightness and shortness of breath.   Cardiovascular:  Negative for chest pain, palpitations and leg swelling.  Neurological:  Negative for dizziness and headaches.   Per HPI unless specifically indicated above     Objective:    There were no vitals taken for this visit.  Wt Readings from Last 3 Encounters:  01/21/22 (!) 356 lb 3.2 oz (161.6 kg)  07/12/21 (!) 371 lb (168.3 kg)  12/29/20 (!) 379 lb 12.8 oz (172.3 kg)    Physical Exam Vitals and nursing note reviewed.  Constitutional:      General: She is not in acute distress.    Appearance: Normal appearance. She is obese. She is not ill-appearing, toxic-appearing or diaphoretic.  HENT:     Head: Normocephalic.     Right Ear: External ear normal.     Left Ear: External ear normal.     Nose:  Nose normal.     Mouth/Throat:     Mouth: Mucous membranes are moist.     Pharynx: Oropharynx is clear.  Eyes:     General:        Right eye: No discharge.        Left eye: No discharge.     Extraocular Movements: Extraocular movements intact.     Conjunctiva/sclera: Conjunctivae normal.     Pupils: Pupils are equal, round, and reactive to light.  Cardiovascular:     Rate and Rhythm: Normal rate and regular rhythm.     Heart sounds: No murmur heard. Pulmonary:     Effort: Pulmonary effort is normal. No respiratory distress.     Breath sounds: Normal breath sounds. No wheezing or rales.  Musculoskeletal:     Cervical back: Normal range of motion and neck supple.  Skin:    General: Skin is warm and dry.     Capillary Refill: Capillary refill takes less than 2 seconds.  Neurological:     General: No focal deficit present.     Mental Status: She is alert and oriented to person, place, and time. Mental status is at baseline.  Psychiatric:        Mood and Affect: Mood normal.        Behavior: Behavior normal.        Thought Content: Thought content normal.        Judgment: Judgment normal.    Results for orders placed or performed during the hospital encounter of 07/20/22  Urinalysis, Routine w reflex  microscopic Urine, Clean Catch  Result Value Ref Range   Color, Urine AMBER (A) YELLOW   APPearance CLEAR (A) CLEAR   Specific Gravity, Urine 1.008 1.005 - 1.030   pH 6.0 5.0 - 8.0   Glucose, UA NEGATIVE NEGATIVE mg/dL   Hgb urine dipstick NEGATIVE NEGATIVE   Bilirubin Urine NEGATIVE NEGATIVE   Ketones, ur NEGATIVE NEGATIVE mg/dL   Protein, ur NEGATIVE NEGATIVE mg/dL   Nitrite NEGATIVE NEGATIVE   Leukocytes,Ua NEGATIVE NEGATIVE      Assessment & Plan:   Problem List Items Addressed This Visit      Endocrine   Pituitary adenoma ([place]) - Primary (Chronic)     Genitourinary   Hypertensive kidney disease with CKD stage III ([place])     Other   Hyperlipidemia (Chronic)    Morbid obesity ([place])   Prediabetes     Follow up plan: No follow-ups on file.

## 2022-07-22 NOTE — Telephone Encounter (Signed)
   Chief Complaint: Back pain and down right hip. Has appointment Monday.Asking for additional pain medication. Symptoms: Above Frequency: 2 weeks ago Pertinent Negatives: Patient denies  Disposition: '[]'$ ED /'[]'$ Urgent Care (no appt availability in office) / '[]'$ Appointment(In office/virtual)/ '[]'$  [place] [facility]/ '[]'$ Home Care/ '[]'$ Refused Recommended Disposition /'[]'$ [place] [facility]/ '[x]'$  Follow-up with PCP Additional Notes:   Answer Assessment - Initial Assessment Questions 1. ONSET: "When did the pain begin?"      2 weeks ago 2. LOCATION: "Where does it hurt?" (upper, mid or lower back)     Low back 3. SEVERITY: "How bad is the pain?"  (e.g., Scale 1-10; mild, moderate, or severe)   - MILD (1-3): Doesn't interfere with normal activities.    - MODERATE (4-7): Interferes with normal activities or awakens from sleep.    - SEVERE (8-10): Excruciating pain, unable to do any normal activities.      Severe 4. PATTERN: "Is the pain constant?" (e.g., yes, no; constant, intermittent)      Constant 5. RADIATION: "Does the pain shoot into your legs or somewhere else?"     Right hip 6. CAUSE:  "What do you think is causing the back pain?"      2 disc 7. BACK OVERUSE:  "Any recent lifting of heavy objects, strenuous work or exercise?"     No 8. MEDICINES: "What have you taken so far for the pain?" (e.g., nothing, acetaminophen, NSAIDS)     Gabapentin 9. NEUROLOGIC SYMPTOMS: "Do you have any weakness, numbness, or problems with bowel/bladder control?"     Weakness 10. OTHER SYMPTOMS: "Do you have any other symptoms?" (e.g., fever, abdomen pain, burning with urination, blood in urine)       No 11. PREGNANCY: "Is there any chance you are pregnant?" "When was your last menstrual period?"       No  Protocols used: Back Pain-A-AH

## 2022-07-25 ENCOUNTER — Ambulatory Visit (INDEPENDENT_AMBULATORY_CARE_PROVIDER_SITE_OTHER): Payer: Medicare Other | Admitting: Nurse Practitioner

## 2022-07-25 ENCOUNTER — Encounter: Payer: Self-pay | Admitting: Nurse Practitioner

## 2022-07-25 VITALS — BP 118/72 | HR 69 | Temp 98.1°F

## 2022-07-25 DIAGNOSIS — D352 Benign neoplasm of pituitary gland: Secondary | ICD-10-CM

## 2022-07-25 DIAGNOSIS — M5441 Lumbago with sciatica, right side: Secondary | ICD-10-CM | POA: Diagnosis not present

## 2022-07-25 DIAGNOSIS — N1832 Chronic kidney disease, stage 3b: Secondary | ICD-10-CM | POA: Diagnosis not present

## 2022-07-25 DIAGNOSIS — I129 Hypertensive chronic kidney disease with stage 1 through stage 4 chronic kidney disease, or unspecified chronic kidney disease: Secondary | ICD-10-CM | POA: Diagnosis not present

## 2022-07-25 DIAGNOSIS — G8929 Other chronic pain: Secondary | ICD-10-CM | POA: Diagnosis not present

## 2022-07-25 DIAGNOSIS — I495 Sick sinus syndrome: Secondary | ICD-10-CM

## 2022-07-25 DIAGNOSIS — I10 Essential (primary) hypertension: Secondary | ICD-10-CM | POA: Diagnosis not present

## 2022-07-25 DIAGNOSIS — E7800 Pure hypercholesterolemia, unspecified: Secondary | ICD-10-CM | POA: Diagnosis not present

## 2022-07-25 DIAGNOSIS — R7303 Prediabetes: Secondary | ICD-10-CM

## 2022-07-25 MED ORDER — GABAPENTIN 300 MG PO CAPS: 300.0000 mg | ORAL_CAPSULE | Freq: Three times a day (TID) | ORAL | 0 refills | Status: DC

## 2022-07-25 NOTE — Assessment & Plan Note (Signed)
Chronic.  Controlled.  Continue with current medication regimen.  Labs ordered today.  Refills sent today.  Return to clinic in 6 months for reevaluation.  Call sooner if concerns arise.   

## 2022-07-25 NOTE — Assessment & Plan Note (Signed)
Labs ordered today.  Will make recommendations based on lab results. ?

## 2022-07-25 NOTE — Assessment & Plan Note (Signed)
Chronic.  Controlled.  Continue with current medication regimen.  Labs ordered today.  Return to clinic in 6 months for reevaluation.  Call sooner if concerns arise.  ? ?

## 2022-07-25 NOTE — Assessment & Plan Note (Signed)
Recommend a healthy lifestyle through diet and exercise.  °

## 2022-07-25 NOTE — Assessment & Plan Note (Signed)
Chronic.  Controlled.  Followed by Cardiology. Continue to follow their recommendations.  Continue with current medication regimen.  Labs ordered today.  Return to clinic in 6 months for reevaluation.  Call sooner if concerns arise.

## 2022-07-25 NOTE — Assessment & Plan Note (Signed)
No new neurologic issues. Continue plavix and lifestyle modifications.  Will continue to reassess at future visits.

## 2022-07-25 NOTE — Assessment & Plan Note (Signed)
Chronic.  Controlled.  Continue with current medication regimen on Losartan '100mg'$  daily and Chlorthalidone '25mg'$  daily.  Refills sent today.  Labs ordered today.  Return to clinic in 6 months for reevaluation.  Call sooner if concerns arise.

## 2022-07-26 DIAGNOSIS — M545 Low back pain, unspecified: Secondary | ICD-10-CM | POA: Diagnosis not present

## 2022-07-26 DIAGNOSIS — M5416 Radiculopathy, lumbar region: Secondary | ICD-10-CM | POA: Diagnosis not present

## 2022-07-26 LAB — COMPREHENSIVE METABOLIC PANEL
ALT: 77 IU/L — ABNORMAL HIGH (ref 0–32)
AST: 28 IU/L (ref 0–40)
Albumin/Globulin Ratio: 1.1 — ABNORMAL LOW (ref 1.2–2.2)
Albumin: 4 g/dL (ref 3.9–4.9)
Alkaline Phosphatase: 117 IU/L (ref 44–121)
BUN/Creatinine Ratio: 14 (ref 12–28)
BUN: 15 mg/dL (ref 8–27)
Bilirubin Total: 0.8 mg/dL (ref 0.0–1.2)
CO2: 26 mmol/L (ref 20–29)
Calcium: 9.2 mg/dL (ref 8.7–10.3)
Chloride: 99 mmol/L (ref 96–106)
Creatinine, Ser: 1.04 mg/dL — ABNORMAL HIGH (ref 0.57–1.00)
Globulin, Total: 3.6 g/dL (ref 1.5–4.5)
Glucose: 127 mg/dL — ABNORMAL HIGH (ref 70–99)
Potassium: 3.7 mmol/L (ref 3.5–5.2)
Sodium: 139 mmol/L (ref 134–144)
Total Protein: 7.6 g/dL (ref 6.0–8.5)
eGFR: 59 mL/min/{1.73_m2} — ABNORMAL LOW (ref >59)

## 2022-07-26 LAB — LIPID PANEL
Chol/HDL Ratio: 3.5 ratio (ref 0.0–4.4)
Cholesterol, Total: 166 mg/dL (ref 100–199)
HDL: 48 mg/dL (ref >39)
LDL Chol Calc (NIH): 100 mg/dL — ABNORMAL HIGH (ref 0–99)
Triglycerides: 95 mg/dL (ref 0–149)
VLDL Cholesterol Cal: 18 mg/dL (ref 5–40)

## 2022-07-26 LAB — HEMOGLOBIN A1C
Est. average glucose Bld gHb Est-mCnc: 134 mg/dL
Hgb A1c MFr Bld: 6.3 % — ABNORMAL HIGH (ref 4.8–5.6)

## 2022-07-26 NOTE — Progress Notes (Signed)
Please let patient know that overall her lab work looks good.  A1c is well controlled at 6.3.  No concerns at this time.  Kidney function remains stable.  Continue with current medication regimen.  Follow up as discussed.

## 2022-08-01 ENCOUNTER — Other Ambulatory Visit: Payer: Self-pay | Admitting: Nurse Practitioner

## 2022-08-02 NOTE — Telephone Encounter (Signed)
Requested Prescriptions  Pending Prescriptions Disp Refills  . ACCU-CHEK GUIDE test strip [Pharmacy Med Name: ACCU-CHEK GUIDE TEST STRIP 100] 100 strip 0    Sig: Use to check blood sugar 3 times a day and document for visits. Goal is <130 fasting and <180 two hours after meal.,     Endocrinology: Diabetes - Testing Supplies Passed - 08/01/2022 12:24 PM      Passed - Valid encounter within last 12 months    Recent Outpatient Visits          1 week ago Pituitary adenoma [facility])   [facility] [name], NP   6 months ago Annual physical exam   [facility] [name], NP   1 year ago Hypertensive kidney disease with stage 3b chronic kidney disease ([place])   [place] [name], NP   1 year ago Annual physical exam   [facility] [name], NP   2 years ago Hypertensive kidney disease with stage 3 chronic kidney disease, unspecified whether stage 3a or 3b CKD   [place], Rachel Elizabeth, [place]      Future Appointments            In 5 months [name], NP [facility], [place]           . Accu-Chek Softclix Lancets lancets [Pharmacy Med Name: ACCU-CHEK SOFTCLIX LANCETS] 100 each 0    Sig: Use to check blood sugar 3 times a day and document for visits. Goal is<130 fasting and <180 two hours after meal.,     Endocrinology: Diabetes - Testing Supplies Passed - 08/01/2022 12:24 PM      Passed - Valid encounter within last 12 months    Recent Outpatient Visits          1 week ago Pituitary adenoma [facility])   [facility] [name], NP   6 months ago Annual physical exam   [facility] [name], NP   1 year ago Hypertensive kidney disease with stage 3b chronic kidney disease ([place])   [place] [name], NP   1 year ago Annual physical exam   [facility] [name], NP   2 years ago  Hypertensive kidney disease with stage 3 chronic kidney disease, unspecified whether stage 3a or 3b CKD   [place], Rachel Elizabeth, [place]      Future Appointments            In 5 months [name], NP [facility], [place]

## 2022-08-03 ENCOUNTER — Other Ambulatory Visit: Payer: Self-pay | Admitting: Nurse Practitioner

## 2022-08-03 NOTE — Telephone Encounter (Signed)
Medication Refill - Medication: lidocaine (LIDODERM) 5 % [[id number]]   Has the patient contacted their pharmacy? No. (Agent: If no, request that the patient contact the pharmacy for the refill. If patient does not wish to contact the pharmacy document the reason why and proceed with request.) (Agent: If yes, when and what did the pharmacy advise?)  Preferred Pharmacy (with phone number or street name): [facility]  Has the patient been seen for an appointment in the last year OR does the patient have an upcoming appointment? Yes.    Agent: Please be advised that RX refills may take up to 3 business days. We ask that you follow-up with your pharmacy.

## 2022-08-04 MED ORDER — LIDOCAINE 5 % EX PTCH: 1.0000 | MEDICATED_PATCH | CUTANEOUS | 0 refills | Status: DC

## 2022-08-04 NOTE — Telephone Encounter (Signed)
Requested medication (s) are due for refill today: no  Requested medication (s) are on the active medication list: yes  Last refill:  07/20/22 #30 patches  Future visit scheduled: yes  Notes to clinic:  medication prescribed by Teodoro Spray PA hospitalist   Requested Prescriptions  Pending Prescriptions Disp Refills   lidocaine (LIDODERM) 5 % 30 patch 0    Sig: Place 1 patch onto the skin daily. Remove & Discard patch within 12 hours or as directed by MD     Analgesics:  Topicals Failed - 08/03/2022  3:29 PM      Failed - Manual Review: Labs are only required if the patient has taken medication for more than 8 weeks.      Failed - Cr in normal range and within 360 days    Creatinine  Date Value Ref Range Status  07/12/2014 1.37 (H) 0.60 - 1.30 mg/dL Final   Creatinine, Ser  Date Value Ref Range Status  07/25/2022 1.04 (H) 0.57 - 1.00 mg/dL Final         Passed - PLT in normal range and within 360 days    Platelets  Date Value Ref Range Status  01/21/2022 311 150 - 450 x10E3/uL Final         Passed - HGB in normal range and within 360 days    Hemoglobin  Date Value Ref Range Status  01/21/2022 14.9 11.1 - 15.9 g/dL Final         Passed - HCT in normal range and within 360 days    Hematocrit  Date Value Ref Range Status  01/21/2022 43.8 34.0 - 46.6 % Final         Passed - eGFR is 30 or above and within 360 days    EGFR (African American)  Date Value Ref Range Status  07/12/2014 49 (L)  Final   GFR calc Af Amer  Date Value Ref Range Status  12/29/2020 59 (L) >59 mL/min/1.73 Final    Comment:    **In accordance with recommendations from the NKF-ASN Task force,**   [facility] is in the process of updating its eGFR calculation to the   2021 CKD-EPI creatinine equation that estimates kidney function   without a race variable.    EGFR (Non-African Amer.)  Date Value Ref Range Status  07/12/2014 42 (L)  Final    Comment:    eGFR values <1m/min/1.73 m2 may  be an indication of chronic kidney disease (CKD). Calculated eGFR is useful in patients with stable renal function. The eGFR calculation will not be reliable in acutely ill patients when serum creatinine is changing rapidly. It is not useful in  patients on dialysis. The eGFR calculation may not be applicable to patients at the low and high extremes of body sizes, pregnant women, and vegetarians.    GFR calc non Af Amer  Date Value Ref Range Status  12/29/2020 51 (L) >59 mL/min/1.73 Final   eGFR  Date Value Ref Range Status  07/25/2022 59 (L) >59 mL/min/1.73 Final         Passed - Patient is not pregnant      Passed - Valid encounter within last 12 months    Recent Outpatient Visits           1 week ago Pituitary adenoma ([place]   [place]   6 months ago Annual physical exam   [place]   1 year ago Hypertensive kidney  disease with stage 3b chronic kidney disease ([place])   [facility] [name], NP   1 year ago Annual physical exam   [facility] [name], NP   2 years ago Hypertensive kidney disease with stage 3 chronic kidney disease, unspecified whether stage 3a or 3b CKD   [facility] [place], [place]       Future Appointments             In 5 months [name], NP [facility], [place]

## 2022-08-09 DIAGNOSIS — M5416 Radiculopathy, lumbar region: Secondary | ICD-10-CM | POA: Diagnosis not present

## 2022-08-09 DIAGNOSIS — M545 Low back pain, unspecified: Secondary | ICD-10-CM | POA: Diagnosis not present

## 2022-08-22 ENCOUNTER — Other Ambulatory Visit: Payer: Self-pay | Admitting: Nurse Practitioner

## 2022-08-23 NOTE — Telephone Encounter (Signed)
Requested medications are due for refill today.  Unsure - see note.  Requested medications are on the active medications list.  yes  Last refill. 07/25/2022 #90 0 rf  Future visit scheduled.   yes   Notes to clinic.  Please see notes below for OV regarding medication.   Chronic bilateral low back pain with right-sided sciatica         Will increase dose of Gabapentin to '300mg'$  TID. Has an appt with Ortho tomorrow. Follow up with Ortho for further treatment.    Relevant Medications    gabapentin (NEURONTIN) 300 MG capsule      Requested Prescriptions  Pending Prescriptions Disp Refills   gabapentin (NEURONTIN) 300 MG capsule [Pharmacy Med Name: GABAPENTIN 300 MG CAPSULE] 90 capsule 0    Sig: Take 1 capsule (300 mg total) by mouth 3 (three) times daily.     Neurology: Anticonvulsants - gabapentin Failed - 08/22/2022 10:44 AM      Failed - Cr in normal range and within 360 days    Creatinine  Date Value Ref Range Status  07/12/2014 1.37 (H) 0.60 - 1.30 mg/dL Final   Creatinine, Ser  Date Value Ref Range Status  07/25/2022 1.04 (H) 0.57 - 1.00 mg/dL Final         Passed - Completed PHQ-2 or PHQ-9 in the last 360 days      Passed - Valid encounter within last 12 months    Recent Outpatient Visits           4 weeks ago Pituitary adenoma ([place])   [facility] [name], NP   7 months ago Annual physical exam   [facility] [name], NP   1 year ago Hypertensive kidney disease with stage 3b chronic kidney disease ([place])   [place] [name], NP   1 year ago Annual physical exam   [facility] [name], NP   2 years ago Hypertensive kidney disease with stage 3 chronic kidney disease, unspecified whether stage 3a or 3b CKD   [place], [place], [place]       Future Appointments             In 5 months [name], NP [facility], [place]

## 2022-09-01 ENCOUNTER — Other Ambulatory Visit: Payer: Self-pay | Admitting: Nurse Practitioner

## 2022-09-01 NOTE — Telephone Encounter (Signed)
Requested Prescriptions  Pending Prescriptions Disp Refills  . Accu-Chek Softclix Lancets lancets [Pharmacy Med Name: ACCU-CHEK SOFTCLIX LANCETS] 100 each 0    Sig: Use to check blood sugar 3 times a day and document for visits. Goal is <130 fasting and <180 two hours after meal.,     Endocrinology: Diabetes - Testing Supplies Passed - 09/01/2022  9:55 AM      Passed - Valid encounter within last 12 months    Recent Outpatient Visits          1 month ago Pituitary adenoma [facility])   [facility] [name], NP   7 months ago Annual physical exam   [facility] [name], NP   1 year ago Hypertensive kidney disease with stage 3b chronic kidney disease ([place])   [place] [name], NP   1 year ago Annual physical exam   [facility] [name], NP   2 years ago Hypertensive kidney disease with stage 3 chronic kidney disease, unspecified whether stage 3a or 3b CKD   [place], [place], [place]      Future Appointments            In 4 months [name], NP [facility], [place]           . ACCU-CHEK GUIDE test strip [Pharmacy Med Name: ACCU-CHEK GUIDE TEST STRIP 100] 100 strip 0    Sig: Use to check blood sugar 3 times a day and document for visits. Goal is <130 fasting and <180 two hours after meal.,     Endocrinology: Diabetes - Testing Supplies Passed - 09/01/2022  9:55 AM      Passed - Valid encounter within last 12 months    Recent Outpatient Visits          1 month ago Pituitary adenoma ([place])   [facility] [name], NP   7 months ago Annual physical exam   [facility] [name], NP   1 year ago Hypertensive kidney disease with stage 3b chronic kidney disease ([place])   [place] [name], NP   1 year ago Annual physical exam   [facility] [name], NP   2 years ago  Hypertensive kidney disease with stage 3 chronic kidney disease, unspecified whether stage 3a or 3b CKD   [place], [place], [place]      Future Appointments            In 4 months [name], NP [facility], [place]

## 2022-09-06 DIAGNOSIS — M5459 Other low back pain: Secondary | ICD-10-CM | POA: Diagnosis not present

## 2022-09-06 DIAGNOSIS — M5416 Radiculopathy, lumbar region: Secondary | ICD-10-CM | POA: Diagnosis not present

## 2022-09-14 ENCOUNTER — Ambulatory Visit: Payer: Self-pay | Admitting: *Deleted

## 2022-09-14 NOTE — Telephone Encounter (Signed)
  Chief Complaint: vomiting Symptoms: constipated Frequency: 3 days Pertinent Negatives: Patient denies fever Disposition: _0 ED /_1 Urgent Care (no appt availability in office) / _2 Appointment(In office/virtual)/ _3  [facility]/ _4 Home Care/ _5 Refused Recommended Disposition /_6  Mobile Bus/ _7  Follow-up with PCP Additional Notes: Pt tested positive for COVID 7 days ago. Had already been sick a few days when tested. Those symptoms are better but now she has been vomiting. Starting to burn at top of abd. Offered appt today but she feels too bad to come today so she took an appt for tomorrow. Advised to call back if worsened and move appt up.   Reason for Disposition  [1] COVID-19 diagnosed by positive lab test (e.g., PCR, rapid self-test kit) AND [2] NO symptoms (e.g., cough, fever, others)  [1] Continuous (nonstop) coughing interferes with work or school AND [2] no improvement using cough treatment per Care Advice  Answer Assessment - Initial Assessment Questions 1. COVID-19 DIAGNOSIS: "How do you know that you have COVID?" (e.g., positive lab test or self-test, diagnosed by doctor or NP/PA, symptoms after exposure).     Home test last week, Wednesday last week 2. COVID-19 EXPOSURE: "Was there any known exposure to COVID before the symptoms began?" CDC Definition of close contact: within 6 feet (2 meters) for a total of 15 minutes or more over a 24-hour period.      no 3. ONSET: "When did the COVID-19 symptoms start?"      One week ago 4. WORST SYMPTOM: "What is your worst symptom?" (e.g., cough, fever, shortness of breath, muscle aches)     Now having abd pain at top 5. COUGH: "Do you have a cough?" If Yes, ask: "How bad is the cough?"       Yes, clear phlegm 6. FEVER: "Do you have a fever?" If Yes, ask: "What is your temperature, how was it measured, and when did it start?"     no 7. RESPIRATORY STATUS: "Describe your breathing?" (e.g., normal; shortness of breath,  wheezing, unable to speak)      normal 8. BETTER-SAME-WORSE: "Are you getting better, staying the same or getting worse compared to yesterday?"  If getting worse, ask, "In what way?"     worse 9. OTHER SYMPTOMS: "Do you have any other symptoms?"  (e.g., chills, fatigue, headache, loss of smell or taste, muscle pain, sore throat)     Muscle aches, fatigue 10. HIGH RISK DISEASE: "Do you have any chronic medical problems?" (e.g., asthma, heart or lung disease, weak immune system, obesity, etc.)       Asthma, used inhaler last week 11. VACCINE: "Have you had the COVID-19 vaccine?" If Yes, ask: "Which one, how many shots, when did you get it?"       yes 12. PREGNANCY: "Is there any chance you are pregnant?" "When was your last menstrual period?"       no 13. O2 SATURATION MONITOR:  "Do you use an oxygen saturation monitor (pulse oximeter) at home?" If Yes, ask "What is your reading (oxygen level) today?" "What is your usual oxygen saturation reading?" (e.g., 95%)       no  Protocols used: Coronavirus (COVID-19) Diagnosed or Suspected-A-AH

## 2022-09-15 ENCOUNTER — Ambulatory Visit (INDEPENDENT_AMBULATORY_CARE_PROVIDER_SITE_OTHER): Payer: Medicare Other | Admitting: Nurse Practitioner

## 2022-09-15 ENCOUNTER — Encounter: Payer: Self-pay | Admitting: Nurse Practitioner

## 2022-09-15 ENCOUNTER — Telehealth: Payer: Self-pay | Admitting: Nurse Practitioner

## 2022-09-15 VITALS — BP 127/80 | HR 74 | Temp 98.3°F

## 2022-09-15 DIAGNOSIS — R1110 Vomiting, unspecified: Secondary | ICD-10-CM | POA: Insufficient documentation

## 2022-09-15 DIAGNOSIS — R1114 Bilious vomiting: Secondary | ICD-10-CM

## 2022-09-15 DIAGNOSIS — R112 Nausea with vomiting, unspecified: Secondary | ICD-10-CM | POA: Insufficient documentation

## 2022-09-15 DIAGNOSIS — Z8616 Personal history of COVID-19: Secondary | ICD-10-CM | POA: Insufficient documentation

## 2022-09-15 HISTORY — DX: Nausea with vomiting, unspecified: R11.2

## 2022-09-15 MED ORDER — CEFPODOXIME PROXETIL 100 MG PO TABS: 100.0000 mg | ORAL_TABLET | Freq: Two times a day (BID) | ORAL | 0 refills | Status: DC

## 2022-09-15 NOTE — Assessment & Plan Note (Signed)
Acute last week, with subsequent GI issues now resolved.  However, still having wheezing and cough with sinus pressure.  Allergic to multiple antibiotics, will trial Vantin 100 MG BID -- have discussed with her if any rash or side effect to immediately stop taking and alert provider.  Recommend: - Increased rest - Increasing Fluids - Acetaminophen / ibuprofen as needed for fever/pain.  - Mucinex.  - Humidifying the air.  Return to office if worsening or ongoing.

## 2022-09-15 NOTE — Assessment & Plan Note (Signed)
Acute for 24 hours, now improved with no further episodes.  Post Covid one week ago.  Recommend she focus on BLAND diet for the next 48 hours to ensure complete resolution of GI symptoms.  If any return or worsening then immediately alert provider.

## 2022-09-15 NOTE — Telephone Encounter (Signed)
[place] about patients RX that was sent  for (Cefpooxime)and stated  that she is allergic and needs another RX sent in its place.Please advise.

## 2022-09-15 NOTE — Patient Instructions (Signed)

## 2022-09-15 NOTE — Progress Notes (Signed)
Acute Office Visit  Subjective:     Patient ID: Mary Webb, female    DOB: December 19, 1955, 66 y.o.   MRN: [id number]  Chief Complaint  Patient presents with   Emesis    Pt states she has been vomiting and upper abdominal burning for the last 2 days. States the last time she vomited was yesterday morning. States the pain and vomiting stopped after yesterday morning.    Started out around 09/04/22, her husband and her were exposed to Covid at church.  Her husband initially presented with symptoms and then she started with symptoms last week -- upper respiratory symptoms -- cough and sinus issues.  On 09/13/22 started out with spasms in stomach and burning in epigastric area + constipation.  Yesterday threw-up about 4 times, emptied out 4 times.  Then after last episode, she tried taking Mylanta, there was no further episode of pain or N&V.    Emesis  This is a new problem. The current episode started 1 to 4 weeks ago. The problem occurs 2 to 4 times per day. The problem has been gradually improving. The emesis has an appearance of stomach contents. There has been no fever. Associated symptoms include coughing. Pertinent negatives include no abdominal pain, chest pain, chills, diarrhea, fever, headaches, myalgias, sweats, URI or weight loss.   Patient is in today for recent GI upset.  Review of Systems  Constitutional:  Negative for chills, fever and weight loss.  HENT:  Positive for congestion and sinus pain. Negative for ear discharge, ear pain, sore throat and tinnitus.   Respiratory:  Positive for cough and wheezing. Negative for shortness of breath.   Cardiovascular:  Negative for chest pain, palpitations and leg swelling.  Gastrointestinal:  Positive for constipation (improved), heartburn (improved), nausea (improved) and vomiting (improved). Negative for abdominal pain and diarrhea.  Musculoskeletal:  Negative for myalgias.  Neurological:  Negative for tingling, weakness and  headaches.  Psychiatric/Behavioral: Negative.        Objective:    BP 127/80   Pulse 74   Temp 98.3 F (36.8 C) (Oral)   SpO2 97%  BP Readings from Last 3 Encounters:  09/15/22 127/80  07/25/22 118/72  07/20/22 (!) 112/56   Wt Readings from Last 3 Encounters:  01/21/22 (!) 356 lb 3.2 oz (161.6 kg)  07/12/21 (!) 371 lb (168.3 kg)  12/29/20 (!) 379 lb 12.8 oz (172.3 kg)      Physical Exam Vitals and nursing note reviewed.  Constitutional:      General: She is awake. She is not in acute distress.    Appearance: She is well-developed and well-groomed. She is obese. She is not ill-appearing or toxic-appearing.  HENT:     Head: Normocephalic.     Right Ear: Hearing, tympanic membrane, ear canal and external ear normal.     Left Ear: Hearing, tympanic membrane, ear canal and external ear normal.     Nose: Nose normal. No rhinorrhea.     Right Sinus: No maxillary sinus tenderness or frontal sinus tenderness.     Left Sinus: No maxillary sinus tenderness or frontal sinus tenderness.     Mouth/Throat:     Mouth: Mucous membranes are moist.     Pharynx: No pharyngeal swelling, oropharyngeal exudate or posterior oropharyngeal erythema.  Eyes:     General: Lids are normal.        Right eye: No discharge.        Left eye: No discharge.  Conjunctiva/sclera: Conjunctivae normal.     Pupils: Pupils are equal, round, and reactive to light.  Neck:     Vascular: No carotid bruit.  Cardiovascular:     Rate and Rhythm: Normal rate and regular rhythm.     Heart sounds: Normal heart sounds. No murmur heard.    No gallop.  Pulmonary:     Effort: Pulmonary effort is normal. No accessory muscle usage or respiratory distress.     Breath sounds: Normal breath sounds.  Abdominal:     General: Bowel sounds are normal. There is no distension.     Palpations: Abdomen is soft.     Tenderness: There is no abdominal tenderness.  Musculoskeletal:     Cervical back: Normal range of motion and  neck supple.     Right lower leg: No edema.     Left lower leg: No edema.  Skin:    General: Skin is warm and dry.  Neurological:     Mental Status: She is alert and oriented to person, place, and time.     Deep Tendon Reflexes: Reflexes are normal and symmetric.  Psychiatric:        Attention and Perception: Attention normal.        Mood and Affect: Mood normal.        Speech: Speech normal.        Behavior: Behavior normal. Behavior is cooperative.        Thought Content: Thought content normal.     No results found for any visits on 09/15/22.      Assessment & Plan:   Problem List Items Addressed This Visit       Digestive   Emesis    Acute for 24 hours, now improved with no further episodes.  Post Covid one week ago.  Recommend she focus on BLAND diet for the next 48 hours to ensure complete resolution of GI symptoms.  If any return or worsening then immediately alert provider.        Other   History of COVID-19 - Primary    Acute last week, with subsequent GI issues now resolved.  However, still having wheezing and cough with sinus pressure.  Allergic to multiple antibiotics, will trial Vantin 100 MG BID -- have discussed with her if any rash or side effect to immediately stop taking and alert provider.  Recommend: - Increased rest - Increasing Fluids - Acetaminophen / ibuprofen as needed for fever/pain.  - Mucinex.  - Humidifying the air.  Return to office if worsening or ongoing.       Meds ordered this encounter  Medications   cefpodoxime (VANTIN) 100 MG tablet    Sig: Take 1 tablet (100 mg total) by mouth 2 (two) times daily for 5 days.    Dispense:  10 tablet    Refill:  0    Return for as scheduled with Santiago Glad.  Venita Lick, NP

## 2022-09-16 ENCOUNTER — Other Ambulatory Visit: Payer: Self-pay | Admitting: Nurse Practitioner

## 2022-09-16 NOTE — Telephone Encounter (Signed)
Spoke with patient and notified her of Jolene's recommendations. Patient asked if I could reach out to [employer] and make them aware of Jolene's recommendations as well. Spoke with Page from [employer] and made her aware of Jolene's recommendations. Page verbalized understanding.

## 2022-09-16 NOTE — Telephone Encounter (Signed)
Requested medication (s) are due for refill today: yes  Requested medication (s) are on the active medication list: no  Last refill:  08/04/22  Future visit scheduled: yes  Notes to clinic:  Unable to refill per protocol, Rx expired. Medication not on current list,routing for review.      Requested Prescriptions  Pending Prescriptions Disp Refills   lidocaine (LIDODERM) 5 % [Pharmacy Med Name: LIDOCAINE 5% PATCH]  0    Sig: Place 1 patch onto the skin daily. Remove & Discard patch within 12 hours or as directed by MD     Analgesics:  Topicals Failed - 09/16/2022  1:50 PM      Failed - Manual Review: Labs are only required if the patient has taken medication for more than 8 weeks.      Failed - Cr in normal range and within 360 days    Creatinine  Date Value Ref Range Status  07/12/2014 1.37 (H) 0.60 - 1.30 mg/dL Final   Creatinine, Ser  Date Value Ref Range Status  07/25/2022 1.04 (H) 0.57 - 1.00 mg/dL Final         Passed - PLT in normal range and within 360 days    Platelets  Date Value Ref Range Status  01/21/2022 311 150 - 450 x10E3/uL Final         Passed - HGB in normal range and within 360 days    Hemoglobin  Date Value Ref Range Status  01/21/2022 14.9 11.1 - 15.9 g/dL Final         Passed - HCT in normal range and within 360 days    Hematocrit  Date Value Ref Range Status  01/21/2022 43.8 34.0 - 46.6 % Final         Passed - eGFR is 30 or above and within 360 days    EGFR (African American)  Date Value Ref Range Status  07/12/2014 49 (L)  Final   GFR calc Af Amer  Date Value Ref Range Status  12/29/2020 59 (L) >59 mL/min/1.73 Final    Comment:    **In accordance with recommendations from the NKF-ASN Task force,**   [facility] is in the process of updating its eGFR calculation to the   2021 CKD-EPI creatinine equation that estimates kidney function   without a race variable.    EGFR (Non-African Amer.)  Date Value Ref Range Status  07/12/2014 42  (L)  Final    Comment:    eGFR values <57m/min/1.73 m2 may be an indication of chronic kidney disease (CKD). Calculated eGFR is useful in patients with stable renal function. The eGFR calculation will not be reliable in acutely ill patients when serum creatinine is changing rapidly. It is not useful in  patients on dialysis. The eGFR calculation may not be applicable to patients at the low and high extremes of body sizes, pregnant women, and vegetarians.    GFR calc non Af Amer  Date Value Ref Range Status  12/29/2020 51 (L) >59 mL/min/1.73 Final   eGFR  Date Value Ref Range Status  07/25/2022 59 (L) >59 mL/min/1.73 Final         Passed - Patient is not pregnant      Passed - Valid encounter within last 12 months    Recent Outpatient Visits           Yesterday [place]   1 month ago Pituitary adenoma ([place]   [facility] [name], NP   7 months ago Annual physical exam   [facility] [name], NP   1 year ago Hypertensive kidney disease with stage 3b chronic kidney disease ([place])   [place] [name], NP   1 year ago Annual physical exam   [facility] [name], NP       Future Appointments             In 4 months [name], NP [facility], [place]

## 2022-09-16 NOTE — Addendum Note (Signed)
Addended by: Marnee Guarneri T on: 09/16/2022 08:46 AM   Modules accepted: Orders

## 2022-09-29 ENCOUNTER — Other Ambulatory Visit: Payer: Self-pay

## 2022-09-29 ENCOUNTER — Other Ambulatory Visit: Payer: Self-pay | Admitting: Nurse Practitioner

## 2022-09-29 NOTE — Telephone Encounter (Signed)
Requested medication (s) are due for refill today: historical medication  Requested medication (s) are on the active medication list: yes  Last refill:  na  Future visit scheduled: yes in 3 months   Notes to clinic:  historical medication . Do you want to order Rx?     Requested Prescriptions  Pending Prescriptions Disp Refills   JARDIANCE 10 MG TABS tablet [Pharmacy Med Name: JARDIANCE 10 MG TABLET] 30 tablet 0    Sig: Take 1 tablet (10 mg total) by mouth daily before breakfast.     Endocrinology:  Diabetes - SGLT2 Inhibitors Failed - 09/29/2022 10:49 AM      Failed - Cr in normal range and within 360 days    Creatinine  Date Value Ref Range Status  07/12/2014 1.37 (H) 0.60 - 1.30 mg/dL Final   Creatinine, Ser  Date Value Ref Range Status  07/25/2022 1.04 (H) 0.57 - 1.00 mg/dL Final         Failed - eGFR in normal range and within 360 days    EGFR (African American)  Date Value Ref Range Status  07/12/2014 49 (L)  Final   GFR calc Af Amer  Date Value Ref Range Status  12/29/2020 59 (L) >59 mL/min/1.73 Final    Comment:    **In accordance with recommendations from the NKF-ASN Task force,**   [facility] is in the process of updating its eGFR calculation to the   2021 CKD-EPI creatinine equation that estimates kidney function   without a race variable.    EGFR (Non-African Amer.)  Date Value Ref Range Status  07/12/2014 42 (L)  Final    Comment:    eGFR values <32m/min/1.73 m2 may be an indication of chronic kidney disease (CKD). Calculated eGFR is useful in patients with stable renal function. The eGFR calculation will not be reliable in acutely ill patients when serum creatinine is changing rapidly. It is not useful in  patients on dialysis. The eGFR calculation may not be applicable to patients at the low and high extremes of body sizes, pregnant women, and vegetarians.    GFR calc non Af Amer  Date Value Ref Range Status  12/29/2020 51 (L) >59 mL/min/1.73  Final   eGFR  Date Value Ref Range Status  07/25/2022 59 (L) >59 mL/min/1.73 Final         Passed - HBA1C is between 0 and 7.9 and within 180 days    Hgb A1c MFr Bld  Date Value Ref Range Status  07/25/2022 6.3 (H) 4.8 - 5.6 % Final    Comment:             Prediabetes: 5.7 - 6.4          Diabetes: >6.4          Glycemic control for adults with diabetes: <7.0          Passed - Valid encounter within last 6 months    Recent Outpatient Visits           2 weeks ago [place]   2 months ago Pituitary adenoma ([place]   [facility]   8 months ago [facility]   [facility]   1 year ago Hypertensive kidney disease with stage 3b chronic kidney disease ([place]   [place], NP   1 year ago [facility]   [name]       Future Appointments             In 3 months [name], NP [facility], [place]

## 2022-09-29 NOTE — Telephone Encounter (Signed)
Medication refused by office today- duplicate request Requested Prescriptions  Pending Prescriptions Disp Refills   JARDIANCE 10 MG TABS tablet [Pharmacy Med Name: JARDIANCE 10 MG TABLET] 30 tablet 0    Sig: Take 1 tablet (10 mg total) by mouth daily before breakfast.     Endocrinology:  Diabetes - SGLT2 Inhibitors Failed - 09/29/2022  1:49 PM      Failed - Cr in normal range and within 360 days    Creatinine  Date Value Ref Range Status  07/12/2014 1.37 (H) 0.60 - 1.30 mg/dL Final   Creatinine, Ser  Date Value Ref Range Status  07/25/2022 1.04 (H) 0.57 - 1.00 mg/dL Final         Failed - eGFR in normal range and within 360 days    EGFR (African American)  Date Value Ref Range Status  07/12/2014 49 (L)  Final   GFR calc Af Amer  Date Value Ref Range Status  12/29/2020 59 (L) >59 mL/min/1.73 Final    Comment:    **In accordance with recommendations from the NKF-ASN Task force,**   [facility] is in the process of updating its eGFR calculation to the   2021 CKD-EPI creatinine equation that estimates kidney function   without a race variable.    EGFR (Non-African Amer.)  Date Value Ref Range Status  07/12/2014 42 (L)  Final    Comment:    eGFR values <76m/min/1.73 m2 may be an indication of chronic kidney disease (CKD). Calculated eGFR is useful in patients with stable renal function. The eGFR calculation will not be reliable in acutely ill patients when serum creatinine is changing rapidly. It is not useful in  patients on dialysis. The eGFR calculation may not be applicable to patients at the low and high extremes of body sizes, pregnant women, and vegetarians.    GFR calc non Af Amer  Date Value Ref Range Status  12/29/2020 51 (L) >59 mL/min/1.73 Final   eGFR  Date Value Ref Range Status  07/25/2022 59 (L) >59 mL/min/1.73 Final         Passed - HBA1C is between 0 and 7.9 and within 180 days    Hgb A1c MFr Bld  Date Value Ref Range Status  07/25/2022 6.3 (H) 4.8  - 5.6 % Final    Comment:             Prediabetes: 5.7 - 6.4          Diabetes: >6.4          Glycemic control for adults with diabetes: <7.0          Passed - Valid encounter within last 6 months    Recent Outpatient Visits           2 weeks ago History of [place], NP   2 months ago Pituitary adenoma ([place]   [place] Karen, NP   8 months ago Annual physical exam   [facility]   1 year ago Hypertensive kidney disease with stage 3b chronic kidney disease ([place]   CCornHJon Billings NP   1 year ago Annual physical exam   [facility]       Future Appointments             In 3 months HJon Billings NP [facility] [place]

## 2022-09-29 NOTE — Telephone Encounter (Signed)
Med refill eq for Jardiance, it is noted on her list that she is NOT taking medication. [facility] is asking to respond, they have been notified that you are no longer practicing here. Please advise for refills.

## 2022-09-30 MED ORDER — JARDIANCE 10 MG PO TABS: 10.0000 mg | ORAL_TABLET | Freq: Every day | ORAL | 0 refills | Status: DC

## 2022-09-30 NOTE — Telephone Encounter (Signed)
Patien states she had Marnee Guarneri NP fill it recently but never picked up the script because of cost, it was running over $150 per patient. States she was able to pick up a refill last month but she needs a new rx.

## 2022-09-30 NOTE — Telephone Encounter (Signed)
Can we find out from the patient if she is taking it and if so, who has been prescribing it to her?

## 2022-10-03 ENCOUNTER — Other Ambulatory Visit: Payer: Self-pay | Admitting: Nurse Practitioner

## 2022-10-03 DIAGNOSIS — I10 Essential (primary) hypertension: Secondary | ICD-10-CM

## 2022-10-03 DIAGNOSIS — I129 Hypertensive chronic kidney disease with stage 1 through stage 4 chronic kidney disease, or unspecified chronic kidney disease: Secondary | ICD-10-CM

## 2022-10-03 DIAGNOSIS — I6322 Cerebral infarction due to unspecified occlusion or stenosis of basilar arteries: Secondary | ICD-10-CM

## 2022-10-03 DIAGNOSIS — I495 Sick sinus syndrome: Secondary | ICD-10-CM

## 2022-10-03 NOTE — Telephone Encounter (Signed)
Requested Prescriptions  Pending Prescriptions Disp Refills   potassium chloride SA (KLOR-CON M) 20 MEQ tablet [Pharmacy Med Name: POTASSIUM CL ER 20 MEQ TABLET] 180 tablet 1    Sig: Take 1 tablet (20 mEq total) by mouth 2 (two) times daily.     Endocrinology:  Minerals - Potassium Supplementation Failed - 10/03/2022 10:55 AM      Failed - Cr in normal range and within 360 days    Creatinine  Date Value Ref Range Status  07/12/2014 1.37 (H) 0.60 - 1.30 mg/dL Final   Creatinine, Ser  Date Value Ref Range Status  07/25/2022 1.04 (H) 0.57 - 1.00 mg/dL Final         Passed - K in normal range and within 360 days    Potassium  Date Value Ref Range Status  07/25/2022 3.7 3.5 - 5.2 mmol/L Final  07/12/2014 3.4 (L) 3.5 - 5.1 mmol/L Final         Passed - Valid encounter within last 12 months    Recent Outpatient Visits           2 weeks ago History of [place], [place] T, NP   2 months ago Pituitary adenoma ([place])   [place], Karen, NP   8 months ago Annual physical exam   [facility] [name], NP   1 year ago Hypertensive kidney disease with stage 3b chronic kidney disease ([place])   [place], Karen, NP   1 year ago Annual physical exam   [place], NP       Future Appointments             In 3 months [name], NP [place], [facility]             chlorthalidone (HYGROTON) 25 MG tablet [Pharmacy Med Name: CHLORTHALIDONE 25 MG TABLET] 90 tablet 1    Sig: Take 1 tablet (25 mg total) by mouth daily.     Cardiovascular: Diuretics - Thiazide Failed - 10/03/2022 10:55 AM      Failed - Cr in normal range and within 180 days    Creatinine  Date Value Ref Range Status  07/12/2014 1.37 (H) 0.60 - 1.30 mg/dL Final   Creatinine, Ser  Date Value Ref Range Status  07/25/2022 1.04 (H) 0.57 - 1.00 mg/dL Final          Passed - K in normal range and within 180 days    Potassium  Date Value Ref Range Status  07/25/2022 3.7 3.5 - 5.2 mmol/L Final  07/12/2014 3.4 (L) 3.5 - 5.1 mmol/L Final         Passed - Na in normal range and within 180 days    Sodium  Date Value Ref Range Status  07/25/2022 139 134 - 144 mmol/L Final  07/12/2014 137 136 - 145 mmol/L Final         Passed - Last BP in normal range    BP Readings from Last 1 Encounters:  09/15/22 127/80         Passed - Valid encounter within last 6 months    Recent Outpatient Visits           2 weeks ago History of [place], [place] T, NP   2 months ago Pituitary adenoma [facility])   [place], NP   8 months ago Annual physical exam  [facility], [place], NP   1 year ago Hypertensive kidney disease with stage 3b chronic kidney disease ([place])   [place] [name], NP   1 year ago Annual physical exam   [facility] [name], NP       Future Appointments             In 3 months [name], NP [place], [facility]             clopidogrel (PLAVIX) 75 MG tablet [Pharmacy Med Name: CLOPIDOGREL 75 MG TABLET] 90 tablet 1    Sig: Take 1 tablet (75 mg total) by mouth daily.     Hematology: Antiplatelets - clopidogrel Failed - 10/03/2022 10:55 AM      Failed - HCT in normal range and within 180 days    Hematocrit  Date Value Ref Range Status  01/21/2022 43.8 34.0 - 46.6 % Final         Failed - HGB in normal range and within 180 days    Hemoglobin  Date Value Ref Range Status  01/21/2022 14.9 11.1 - 15.9 g/dL Final         Failed - PLT in normal range and within 180 days    Platelets  Date Value Ref Range Status  01/21/2022 311 150 - 450 x10E3/uL Final         Failed - Cr in normal range and within 360 days    Creatinine  Date Value Ref Range Status  07/12/2014 1.37 (H) 0.60 -  1.30 mg/dL Final   Creatinine, Ser  Date Value Ref Range Status  07/25/2022 1.04 (H) 0.57 - 1.00 mg/dL Final         Passed - Valid encounter within last 6 months    Recent Outpatient Visits           2 weeks ago History of [place], [place] T, NP   2 months ago Pituitary adenoma ([place])   [place], Karen, NP   8 months ago Annual physical exam   [facility] [name], NP   1 year ago Hypertensive kidney disease with stage 3b chronic kidney disease ([place])   [place] [name], NP   1 year ago Annual physical exam   [facility] [name], NP       Future Appointments             In 3 months [name], NP [facility], [place]

## 2022-10-05 ENCOUNTER — Other Ambulatory Visit: Payer: Self-pay | Admitting: Nurse Practitioner

## 2022-10-05 DIAGNOSIS — I129 Hypertensive chronic kidney disease with stage 1 through stage 4 chronic kidney disease, or unspecified chronic kidney disease: Secondary | ICD-10-CM

## 2022-10-05 DIAGNOSIS — I10 Essential (primary) hypertension: Secondary | ICD-10-CM

## 2022-10-05 MED ORDER — LOSARTAN POTASSIUM 100 MG PO TABS: 100.0000 mg | ORAL_TABLET | Freq: Every day | ORAL | 0 refills | Status: DC

## 2022-10-05 NOTE — Telephone Encounter (Signed)
Future visit in 3 months .  Requested Prescriptions  Pending Prescriptions Disp Refills   losartan (COZAAR) 100 MG tablet 90 tablet     Sig: Take 1 tablet (100 mg total) by mouth daily.     Cardiovascular:  Angiotensin Receptor Blockers Failed - 10/05/2022  4:27 PM      Failed - Cr in normal range and within 180 days    Creatinine  Date Value Ref Range Status  07/12/2014 1.37 (H) 0.60 - 1.30 mg/dL Final   Creatinine, Ser  Date Value Ref Range Status  07/25/2022 1.04 (H) 0.57 - 1.00 mg/dL Final         Passed - K in normal range and within 180 days    Potassium  Date Value Ref Range Status  07/25/2022 3.7 3.5 - 5.2 mmol/L Final  07/12/2014 3.4 (L) 3.5 - 5.1 mmol/L Final         Passed - Patient is not pregnant      Passed - Last BP in normal range    BP Readings from Last 1 Encounters:  09/15/22 127/80         Passed - Valid encounter within last 6 months    Recent Outpatient Visits           2 weeks ago History of [place], [place] T, NP   2 months ago Pituitary adenoma ([place])   [facility] [name], NP   8 months ago Annual physical exam   [facility] [name], NP   1 year ago Hypertensive kidney disease with stage 3b chronic kidney disease ([place])   [place] [name], NP   1 year ago Annual physical exam   [facility] [name], NP       Future Appointments             In 3 months [name], NP [facility], [place]

## 2022-10-05 NOTE — Telephone Encounter (Signed)
Medication Refill - Medication: losartan (COZAAR) 100 MG tablet   Has the patient contacted their pharmacy? Yes.   Pt told to contact provider  Preferred Pharmacy (with phone number or street name):  [place], [place] Phone: [phone]  Fax: [phone]     Has the patient been seen for an appointment in the last year OR does the patient have an upcoming appointment? Yes.    Agent: Please be advised that RX refills may take up to 3 business days. We ask that you follow-up with your pharmacy.

## 2022-10-19 ENCOUNTER — Encounter: Payer: Self-pay | Admitting: Nurse Practitioner

## 2022-10-19 DIAGNOSIS — M5459 Other low back pain: Secondary | ICD-10-CM | POA: Diagnosis not present

## 2022-10-19 NOTE — Progress Notes (Signed)
TH[facility] Quality Other; (KED MEASURE) [place] (KIDNEY HEALTH) MEASURE; URINE MICROALBUMIN CREATININE NEEDED TO CLOSE QUALITY CARE GAP. PLEASE ADDRESS BEFORE 11/13/2022 IF POSSIBLE.

## 2022-10-27 ENCOUNTER — Other Ambulatory Visit: Payer: Self-pay | Admitting: Nurse Practitioner

## 2022-10-27 DIAGNOSIS — N1832 Chronic kidney disease, stage 3b: Secondary | ICD-10-CM

## 2022-10-27 NOTE — Telephone Encounter (Signed)
Requested Prescriptions  Pending Prescriptions Disp Refills   JARDIANCE 10 MG TABS tablet [Pharmacy Med Name: JARDIANCE 10 MG TABLET] 30 tablet 0    Sig: Take 1 tablet (10 mg total) by mouth daily.     Endocrinology:  Diabetes - SGLT2 Inhibitors Failed - 10/27/2022 10:43 AM      Failed - Cr in normal range and within 360 days    Creatinine  Date Value Ref Range Status  07/12/2014 1.37 (H) 0.60 - 1.30 mg/dL Final   Creatinine, Ser  Date Value Ref Range Status  07/25/2022 1.04 (H) 0.57 - 1.00 mg/dL Final         Failed - eGFR in normal range and within 360 days    EGFR (African American)  Date Value Ref Range Status  07/12/2014 49 (L)  Final   GFR calc Af Amer  Date Value Ref Range Status  12/29/2020 59 (L) >59 mL/min/1.73 Final    Comment:    **In accordance with recommendations from the NKF-ASN Task force,**   [facility] is in the process of updating its eGFR calculation to the   2021 CKD-EPI creatinine equation that estimates kidney function   without a race variable.    EGFR (Non-African Amer.)  Date Value Ref Range Status  07/12/2014 42 (L)  Final    Comment:    eGFR values <17m/min/1.73 m2 may be an indication of chronic kidney disease (CKD). Calculated eGFR is useful in patients with stable renal function. The eGFR calculation will not be reliable in acutely ill patients when serum creatinine is changing rapidly. It is not useful in  patients on dialysis. The eGFR calculation may not be applicable to patients at the low and high extremes of body sizes, pregnant women, and vegetarians.    GFR calc non Af Amer  Date Value Ref Range Status  12/29/2020 51 (L) >59 mL/min/1.73 Final   eGFR  Date Value Ref Range Status  07/25/2022 59 (L) >59 mL/min/1.73 Final         Passed - HBA1C is between 0 and 7.9 and within 180 days    Hgb A1c MFr Bld  Date Value Ref Range Status  07/25/2022 6.3 (H) 4.8 - 5.6 % Final    Comment:             Prediabetes: 5.7 - 6.4           Diabetes: >6.4          Glycemic control for adults with diabetes: <7.0          Passed - Valid encounter within last 6 months    Recent Outpatient Visits           1 month ago History of [place], NP   3 months ago Pituitary adenoma ([place]   [place] Karen, NP   9 months ago Annual physical exam   [facility]   1 year ago Hypertensive kidney disease with stage 3b chronic kidney disease (HL[place]   [place]Jon Billings NP   1 year ago Annual physical exam   [facility]       Future Appointments             In 2 months HJon Billings NP [facility] [place]

## 2022-11-09 DIAGNOSIS — Z950 Presence of cardiac pacemaker: Secondary | ICD-10-CM | POA: Diagnosis not present

## 2022-11-09 DIAGNOSIS — I10 Essential (primary) hypertension: Secondary | ICD-10-CM | POA: Diagnosis not present

## 2022-11-09 DIAGNOSIS — N1832 Chronic kidney disease, stage 3b: Secondary | ICD-10-CM | POA: Diagnosis not present

## 2022-11-09 DIAGNOSIS — R918 Other nonspecific abnormal finding of lung field: Secondary | ICD-10-CM | POA: Diagnosis not present

## 2022-11-09 DIAGNOSIS — Z8673 Personal history of transient ischemic attack (TIA), and cerebral infarction without residual deficits: Secondary | ICD-10-CM | POA: Diagnosis not present

## 2022-11-09 DIAGNOSIS — Z01818 Encounter for other preprocedural examination: Secondary | ICD-10-CM | POA: Diagnosis not present

## 2022-11-09 DIAGNOSIS — I495 Sick sinus syndrome: Secondary | ICD-10-CM | POA: Diagnosis not present

## 2022-11-11 ENCOUNTER — Telehealth: Payer: Medicare Other | Admitting: Nurse Practitioner

## 2022-11-11 DIAGNOSIS — I129 Hypertensive chronic kidney disease with stage 1 through stage 4 chronic kidney disease, or unspecified chronic kidney disease: Secondary | ICD-10-CM | POA: Diagnosis not present

## 2022-11-11 DIAGNOSIS — N1832 Chronic kidney disease, stage 3b: Secondary | ICD-10-CM | POA: Diagnosis not present

## 2022-11-11 LAB — MICROALBUMIN, URINE WAIVED
Creatinine, Urine Waived: 50 mg/dL (ref 10–300)
Microalb, Ur Waived: 10 mg/L (ref 0–19)

## 2022-11-11 NOTE — Progress Notes (Deleted)
There were no vitals taken for this visit.   Subjective:    Patient ID: Mary Webb, female    DOB: [birth year], 66 y.o.   MRN: [id number]  HPI: Mary Webb is a 66 y.o. female  No chief complaint on file.  BACK PAIN Duration: {Blank single:19197::"days","weeks","months"} Mechanism of injury: {Blank single:19197::"lifting","MVA","no trauma","unknown"} Location: {Blank multiple:19196::"Right","Left","R>L","L>R","midline","bilateral","low back","upper back"} Onset: {Blank single:19197::"sudden","gradual"} Severity: {Blank single:19197::"mild","moderate","severe","1/10","2/10","3/10","4/10","5/10","6/10","7/10","8/10","9/10","10/10"} Quality: {Blank multiple:19196::"sharp","dull","aching","burning","cramping","ill-defined","itchy","pressure-like","pulling","shooting","sore","stabbing","tender","tearing","throbbing"} Frequency: {Blank single:19197::"constant","intermittent","occasional","rare","every few minutes","a few times a hour","a few times a day","a few times a week","a few times a month","a few times a year"} Radiation: {Blank multiple:19196::"none","buttocks","R leg below the knee","R leg above the knee","L leg below the knee","L leg above the knee"} Aggravating factors: {Blank multiple:19196::"none","lifting","movement","walking","laying","bending","prolonged sitting","coughing","valsalva","Pain increased with coughing/valsalva"} Alleviating factors: {Blank multiple:19196::"nothing","rest","ice","heat","laying","NSAIDs","APAP","narcotics","muscle relaxer"} Status: {Blank multiple:19196::"better","worse","stable","fluctuating"} Treatments attempted: {Blank multiple:19196::"none","rest","ice","heat","APAP","ibuprofen","aleve","physical therapy","HEP","OMM"}  Relief with NSAIDs?: {Blank single:19197::"No NSAIDs Taken","no","mild","moderate","significant"} Nighttime pain:  {Blank single:19197::"yes","no"} Paresthesias / decreased sensation:  {Blank  single:19197::"yes","no"} Bowel / bladder incontinence:  {Blank single:19197::"yes","no"} Fevers:  {Blank single:19197::"yes","no"} Dysuria / urinary frequency:  {Blank single:19197::"yes","no"}  Relevant past medical, surgical, family and social history reviewed and updated as indicated. Interim medical history since our last visit reviewed. Allergies and medications reviewed and updated.  Review of Systems  Per HPI unless specifically indicated above     Objective:    There were no vitals taken for this visit.  Wt Readings from Last 3 Encounters:  01/21/22 (!) 356 lb 3.2 oz (161.6 kg)  07/12/21 (!) 371 lb (168.3 kg)  12/29/20 (!) 379 lb 12.8 oz (172.3 kg)    Physical Exam  Results for orders placed or performed in visit on 07/25/22  Comp Met (CMET)  Result Value Ref Range   Glucose 127 (H) 70 - 99 mg/dL   BUN 15 8 - 27 mg/dL   Creatinine, Ser 1.04 (H) 0.57 - 1.00 mg/dL   eGFR 59 (L) >59 mL/min/1.73   BUN/Creatinine Ratio 14 12 - 28   Sodium 139 134 - 144 mmol/L   Potassium 3.7 3.5 - 5.2 mmol/L   Chloride 99 96 - 106 mmol/L   CO2 26 20 - 29 mmol/L   Calcium 9.2 8.7 - 10.3 mg/dL   Total Protein 7.6 6.0 - 8.5 g/dL   Albumin 4.0 3.9 - 4.9 g/dL   Globulin, Total 3.6 1.5 - 4.5 g/dL   Albumin/Globulin Ratio 1.1 (L) 1.2 - 2.2   Bilirubin Total 0.8 0.0 - 1.2 mg/dL   Alkaline Phosphatase 117 44 - 121 IU/L   AST 28 0 - 40 IU/L   ALT 77 (H) 0 - 32 IU/L  Lipid Profile  Result Value Ref Range   Cholesterol, Total 166 100 - 199 mg/dL   Triglycerides 95 0 - 149 mg/dL   HDL 48 >39 mg/dL   VLDL Cholesterol Cal 18 5 - 40 mg/dL   LDL Chol Calc (NIH) 100 (H) 0 - 99 mg/dL   Chol/HDL Ratio 3.5 0.0 - 4.4 ratio  HgB A1c  Result Value Ref Range   Hgb A1c MFr Bld 6.3 (H) 4.8 - 5.6 %   Est. average glucose Bld gHb Est-mCnc 134 mg/dL      Assessment & Plan:   Problem List Items Addressed This Visit   None    Follow up plan: No follow-ups on file.  This visit was completed via  MyChart due to the restrictions of the COVID-19 pandemic. All issues as above were discussed and addressed. Physical exam was done as above through visual confirmation on MyChart. If it  was felt that the patient should be evaluated in the office, they were directed there. The patient verbally consented to this visit. Location of the patient: *** Location of the provider: Office Those involved with this call:  Provider: [name], NP CMA: *** Front Desk/Registration: *** This encounter was conducted via ***.  I spent *** dedicated to the care of this patient on the date of this encounter to include previsit review of ***, face to face time with the patient, and post visit ordering of testing.

## 2022-11-11 NOTE — Telephone Encounter (Signed)
Order placed for microalbumin.

## 2022-11-15 NOTE — Progress Notes (Signed)
Please thank patient for coming in to drop off that urine sample.  Her Kidneys are putting out some protein.  There is no change to medications at this time.  We will keep an eye on this in the future.

## 2022-11-23 ENCOUNTER — Encounter: Payer: Self-pay | Admitting: Emergency Medicine

## 2022-11-23 ENCOUNTER — Other Ambulatory Visit: Payer: Self-pay

## 2022-11-23 ENCOUNTER — Emergency Department
Admission: EM | Admit: 2022-11-23 | Discharge: 2022-11-24 | Disposition: A | Discharge status: Other Acute Inpatient Hospital | Payer: Medicare Other | Attending: Emergency Medicine | Admitting: Emergency Medicine

## 2022-11-23 ENCOUNTER — Encounter (HOSPITAL_COMMUNITY): Payer: Self-pay

## 2022-11-23 ENCOUNTER — Ambulatory Visit: Payer: Self-pay | Admitting: *Deleted

## 2022-11-23 ENCOUNTER — Emergency Department: Payer: Medicare Other

## 2022-11-23 DIAGNOSIS — R1013 Epigastric pain: Secondary | ICD-10-CM | POA: Diagnosis not present

## 2022-11-23 DIAGNOSIS — N189 Chronic kidney disease, unspecified: Secondary | ICD-10-CM | POA: Insufficient documentation

## 2022-11-23 DIAGNOSIS — Z7902 Long term (current) use of antithrombotics/antiplatelets: Secondary | ICD-10-CM | POA: Diagnosis not present

## 2022-11-23 DIAGNOSIS — K760 Fatty (change of) liver, not elsewhere classified: Secondary | ICD-10-CM | POA: Diagnosis not present

## 2022-11-23 DIAGNOSIS — K8043 Calculus of bile duct with acute cholecystitis with obstruction: Secondary | ICD-10-CM | POA: Diagnosis not present

## 2022-11-23 DIAGNOSIS — I2510 Atherosclerotic heart disease of native coronary artery without angina pectoris: Secondary | ICD-10-CM | POA: Insufficient documentation

## 2022-11-23 DIAGNOSIS — K819 Cholecystitis, unspecified: Secondary | ICD-10-CM | POA: Diagnosis not present

## 2022-11-23 DIAGNOSIS — Z8673 Personal history of transient ischemic attack (TIA), and cerebral infarction without residual deficits: Secondary | ICD-10-CM | POA: Insufficient documentation

## 2022-11-23 DIAGNOSIS — K810 Acute cholecystitis: Secondary | ICD-10-CM | POA: Diagnosis not present

## 2022-11-23 DIAGNOSIS — Z1152 Encounter for screening for COVID-19: Secondary | ICD-10-CM | POA: Diagnosis not present

## 2022-11-23 DIAGNOSIS — Z91040 Latex allergy status: Secondary | ICD-10-CM | POA: Insufficient documentation

## 2022-11-23 DIAGNOSIS — N281 Cyst of kidney, acquired: Secondary | ICD-10-CM | POA: Diagnosis not present

## 2022-11-23 DIAGNOSIS — K3189 Other diseases of stomach and duodenum: Secondary | ICD-10-CM | POA: Diagnosis not present

## 2022-11-23 DIAGNOSIS — R109 Unspecified abdominal pain: Secondary | ICD-10-CM | POA: Diagnosis not present

## 2022-11-23 DIAGNOSIS — K828 Other specified diseases of gallbladder: Secondary | ICD-10-CM | POA: Diagnosis not present

## 2022-11-23 DIAGNOSIS — R9431 Abnormal electrocardiogram [ECG] [EKG]: Secondary | ICD-10-CM | POA: Diagnosis not present

## 2022-11-23 LAB — RESP PANEL BY RT-PCR (RSV, FLU A&B, COVID)  RVPGX2
Influenza A by PCR: NEGATIVE
Influenza B by PCR: NEGATIVE
Resp Syncytial Virus by PCR: NEGATIVE
SARS Coronavirus 2 by RT PCR: NEGATIVE

## 2022-11-23 LAB — TROPONIN I (HIGH SENSITIVITY): Troponin I (High Sensitivity): 14 ng/L (ref <18)

## 2022-11-23 LAB — COMPREHENSIVE METABOLIC PANEL
ALT: 273 U/L — ABNORMAL HIGH (ref 0–44)
AST: 218 U/L — ABNORMAL HIGH (ref 15–41)
Albumin: 4.2 g/dL (ref 3.5–5.0)
Alkaline Phosphatase: 166 U/L — ABNORMAL HIGH (ref 38–126)
Anion gap: 12 (ref 5–15)
BUN: 16 mg/dL (ref 8–23)
CO2: 27 mmol/L (ref 22–32)
Calcium: 9.4 mg/dL (ref 8.9–10.3)
Chloride: 98 mmol/L (ref 98–111)
Creatinine, Ser: 1.43 mg/dL — ABNORMAL HIGH (ref 0.44–1.00)
GFR, Estimated: 40 mL/min — ABNORMAL LOW (ref >60)
Glucose, Bld: 177 mg/dL — ABNORMAL HIGH (ref 70–99)
Potassium: 3.3 mmol/L — ABNORMAL LOW (ref 3.5–5.1)
Sodium: 137 mmol/L (ref 135–145)
Total Bilirubin: 1.6 mg/dL — ABNORMAL HIGH (ref 0.3–1.2)
Total Protein: 9.5 g/dL — ABNORMAL HIGH (ref 6.5–8.1)

## 2022-11-23 LAB — CBC
HCT: 48.6 % — ABNORMAL HIGH (ref 36.0–46.0)
Hemoglobin: 15.6 g/dL — ABNORMAL HIGH (ref 12.0–15.0)
MCH: 30.4 pg (ref 26.0–34.0)
MCHC: 32.1 g/dL (ref 30.0–36.0)
MCV: 94.6 fL (ref 80.0–100.0)
Platelets: 320 10*3/uL (ref 150–400)
RBC: 5.14 MIL/uL — ABNORMAL HIGH (ref 3.87–5.11)
RDW: 13.8 % (ref 11.5–15.5)
WBC: 8.8 10*3/uL (ref 4.0–10.5)
nRBC: 0 % (ref 0.0–0.2)

## 2022-11-23 LAB — LIPASE, BLOOD: Lipase: 30 U/L (ref 11–51)

## 2022-11-23 MED ORDER — METHYLPREDNISOLONE SODIUM SUCC 40 MG IJ SOLR
40.0000 mg | Freq: Once | INTRAMUSCULAR | Status: AC
  Administered 2022-11-23: 40 mg via INTRAVENOUS
  Filled 2022-11-23: qty 1

## 2022-11-23 MED ORDER — ONDANSETRON HCL 4 MG/2ML IJ SOLN
4.0000 mg | Freq: Once | INTRAMUSCULAR | Status: AC
  Administered 2022-11-23: 4 mg via INTRAVENOUS
  Filled 2022-11-23: qty 2

## 2022-11-23 MED ORDER — IOHEXOL 300 MG/ML  SOLN
80.0000 mL | Freq: Once | INTRAMUSCULAR | Status: AC | PRN
  Administered 2022-11-23: 80 mL via INTRAVENOUS

## 2022-11-23 MED ORDER — HYDROMORPHONE HCL 1 MG/ML IJ SOLN
0.5000 mg | Freq: Once | INTRAMUSCULAR | Status: AC
  Administered 2022-11-23: 0.5 mg via INTRAVENOUS
  Filled 2022-11-23: qty 0.5

## 2022-11-23 MED ORDER — SODIUM CHLORIDE 0.9 % IV SOLN
1.0000 g | Freq: Once | INTRAVENOUS | Status: AC
  Administered 2022-11-23: 1 g via INTRAVENOUS
  Filled 2022-11-23: qty 10

## 2022-11-23 MED ORDER — SODIUM CHLORIDE 0.9 % IV BOLUS
500.0000 mL | Freq: Once | INTRAVENOUS | Status: AC
  Administered 2022-11-23: 500 mL via INTRAVENOUS

## 2022-11-23 MED ORDER — MORPHINE SULFATE (PF) 4 MG/ML IV SOLN: 4.0000 mg | Freq: Once | INTRAVENOUS | Status: DC

## 2022-11-23 MED ORDER — DIPHENHYDRAMINE HCL 50 MG/ML IJ SOLN
25.0000 mg | Freq: Once | INTRAMUSCULAR | Status: AC
  Filled 2022-11-23: qty 1

## 2022-11-23 MED ORDER — MORPHINE SULFATE (PF) 4 MG/ML IV SOLN: 6.0000 mg | Freq: Once | INTRAVENOUS | Status: DC

## 2022-11-23 MED ORDER — SODIUM CHLORIDE 0.9 % IV SOLN: Freq: Once | INTRAVENOUS | Status: AC

## 2022-11-23 MED ORDER — DIPHENHYDRAMINE HCL 25 MG PO CAPS
25.0000 mg | ORAL_CAPSULE | Freq: Once | ORAL | Status: AC
  Administered 2022-11-23: 25 mg via ORAL
  Filled 2022-11-23: qty 1

## 2022-11-23 NOTE — Progress Notes (Signed)
This patient was scheduled for an outpatient dual chamber generator change out with Dr. Saralyn Pilar on 11/24/22. She presented to the ED 11/23/22 with severe abdominal pain with ongoing workup this afternoon. Discussed with Dr. Saralyn Pilar, Dr. Cherylann Banas via haiku chat, and with the patient and her husband at bedside - treatment and workup of the patient's abdominal pain precludes the non-urgent generator changeout. She has 2-3 months of battery life left before reaching ERI. We will contact the patient to reschedule the procedure at a later date. She knows to contact our office [facility]) with issues or questions in the interim.   Tristan Schroeder, PA-C  [facility]

## 2022-11-23 NOTE — ED Provider Triage Note (Signed)
Emergency Medicine Provider Triage Evaluation Note  Mary Webb , a 67 y.o. female  was evaluated in triage.  Pt complains of epigastric and right upper quadrant pain after eating food with a lot of grease last night that she states "I am not supposed to eat".  Patient with pain and vomiting this morning.  She denies any upper respiratory symptoms, urinary symptoms or chest pain at this time.  Review of Systems  Positive:  Negative:   Physical Exam  BP (!) 159/96   Pulse 81   Temp 98.3 F (36.8 C)   Resp 20   Ht '5\' 2"'$  (1.575 m)   Wt (!) 154.2 kg   SpO2 96%   BMI 62.19 kg/m  Gen:   Awake, no distress   Resp:  Normal effort lungs clear bilaterally. MSK:   Moves extremities without difficulty  Other:  Abdomen soft with tenderness epigastric and right upper quadrant area.  Bowel sounds are present x 4 quadrants.  Medical Decision Making  Medically screening exam initiated at 10:02 AM.  Appropriate orders placed.  Mary Webb was informed that the remainder of the evaluation will be completed by another provider, this initial triage assessment does not replace that evaluation, and the importance of remaining in the ED until their evaluation is complete.     Mary Hai, PA-C 11/23/22 1003

## 2022-11-23 NOTE — ED Triage Notes (Signed)
Pt via POV from home. Pt c/o epigastric pain that started last night, also endorses vomiting this AM. Pt does still have gallbladder.  Denies fever. Denies urinary symptoms. Pt is A&OX4 and NAD

## 2022-11-23 NOTE — ED Notes (Signed)
Cardiologist at the bedside

## 2022-11-23 NOTE — ED Notes (Signed)
Pt transferred to hospital bed for comfort.

## 2022-11-23 NOTE — Telephone Encounter (Signed)
  Chief Complaint: upper abdominal pain Symptoms: pain upper abdomen- severe Frequency: started early am Pertinent Negatives: Patient denies diarrhea, nausea Disposition: '[x]'$ ED /'[]'$ Urgent Care (no appt availability in office) / '[]'$ Appointment(In office/virtual)/ '[]'$  [place] [facility]/ '[]'$ Home Care/ '[]'$ Refused Recommended Disposition /'[]'$ [place] [facility]/ '[]'$  Follow-up with PCP Additional Notes: Patient has surgery planned for tomorrow- replace pacemaker- she states she woke during the night with severe upper abdominal pain. Constant has not gone away- advised ED for evaluation.

## 2022-11-23 NOTE — ED Notes (Signed)
Called to [facility] per MD [facility] for transfer/rep:Mary Webb

## 2022-11-23 NOTE — ED Notes (Signed)
Pt has a bed assignment @ / BEQ:UHKISN/GX:EXPF Mary Webb .Marland Kitchen

## 2022-11-23 NOTE — Telephone Encounter (Signed)
Reason for Disposition  [1] SEVERE pain (e.g., excruciating) AND [2] present > 1 hour  Answer Assessment - Initial Assessment Questions 1. LOCATION: "Where does it hurt?"      Top of stomach 2. RADIATION: "Does the pain shoot anywhere else?" (e.g., chest, back)     no 3. ONSET: "When did the pain begin?" (e.g., minutes, hours or days ago)      12:30 am 4. SUDDEN: "Gradual or sudden onset?"     sudden 5. PATTERN "Does the pain come and go, or is it constant?"    - If it comes and goes: "How long does it last?" "Do you have pain now?"     (Note: Comes and goes means the pain is intermittent. It goes away completely between bouts.)    - If constant: "Is it getting better, staying the same, or getting worse?"      (Note: Constant means the pain never goes away completely; most serious pain is constant and gets worse.)      constant 6. SEVERITY: "How bad is the pain?"  (e.g., Scale 1-10; mild, moderate, or severe)    - MILD (1-3): Doesn't interfere with normal activities, abdomen soft and not tender to touch.     - MODERATE (4-7): Interferes with normal activities or awakens from sleep, abdomen tender to touch.     - SEVERE (8-10): Excruciating pain, doubled over, unable to do any normal activities.       severe 7. RECURRENT SYMPTOM: "Have you ever had this type of stomach pain before?" If Yes, ask: "When was the last time?" and "What happened that time?"      Yes-3 weeks ago possible virus 8. CAUSE: "What do you think is causing the stomach pain?"     Unsure- what the cause 9. RELIEVING/AGGRAVATING FACTORS: "What makes it better or worse?" (e.g., antacids, bending or twisting motion, bowel movement)     Drinking ginger ale mot helping 10. OTHER SYMPTOMS: "Do you have any other symptoms?" (e.g., back pain, diarrhea, fever, urination pain, vomiting)       vomiting  Protocols used: Abdominal Pain - Female-A-AH, Abdominal Pain - Upper-A-AH

## 2022-11-23 NOTE — ED Provider Notes (Signed)
-----------------------------------------   6:57 PM on 11/23/2022 -----------------------------------------   CT shows pericholecystic fluid concerning for possible cholecystitis and no evidence of bile duct dilatation.  I consulted Dr. Peyton Najjar from general surgery who Dr. Ellender Hose had discussed the case with before.  He will come to evaluate the patient and give further recommendations.  ----------------------------------------- 8:32 PM on 11/23/2022 -----------------------------------------  Dr. Peyton Najjar recommends transfer to a tertiary center; he advises that due to the patient's very high BMI and multiple medical comorbidities, anesthesiology services here at [facility] will not be adequate.  I contacted [facility] transfer center based on patient preference for transfer to [facility].  However, [facility] is at capacity and the transfer was declined.  I have put out a call to [facility] for transfer and am awaiting callback at this time.  ----------------------------------------- 9:55 PM on 11/23/2022 -----------------------------------------  I discussed the case with Dr. Zenia Resides from general surgery at [facility] who agreed with transfer who recommended that the patient be admitted to the medicine service with surgery consulting.  I then consulted Dr. Myna Hidalgo from the hospitalist service; based our discussion he agreed to accept the patient as a transfer.  I updated the patient on the transfer plan.  At this time I will let her have ice and water and then make her n.p.o. after midnight.  I have ordered IV maintenance fluids.  The patient is stable for transfer.     Arta Silence, MD 11/23/22 2156

## 2022-11-23 NOTE — Consult Note (Signed)
SURGICAL CONSULTATION NOTE   HISTORY OF PRESENT ILLNESS (HPI):  67 y.o. female presented to [facility] ED for evaluation of abdominal pain. Patient reports she started having abdominal pain this morning.  Pain woke her up at 1:30 AM.  Pain localized to the epigastric area.  Pain radiates to both upper quadrant.  Patient endorses nausea and vomiting.  Denies any fever.  In the ED she was found pain and mild tenderness in the right upper quadrant.  Physical exam difficult due to body habitus.  White blood cell count of 8.8.  Hemoglobin of 15.  Normal platelets.  CMP shows mild elevation of her creatinine to 1.43.  There is a total bilirubin level of 1.6.  Alkaline phosphatase is also elevated to 160 seed with elevated AST and ALT.  This was concerning for choledocholithiasis.  Abdominal ultrasound shows cholelithiasis with common bile duct of 5 mm.  Impression able to the images.  Due to patient pacemaker she is not able to get MRCP.  Case was discussed with GI from [facility] who recommended CT scan pancreas protocol for evaluation of dilation of common bile duct.  Since the common bile duct was not dilated they did not recommended transfer for further workup of choledocholithiasis.  Present evaluated the images of the abdominal ultrasound and CT scan.  Surgery is consulted by Dr. Cherylann Banas in this context for evaluation and management of acute cholecystitis with possible choledocholithiasis.  PAST MEDICAL HISTORY (PMH):  Past Medical History:  Diagnosis Date   Arthritis    CAD (coronary artery disease)    CKD (chronic kidney disease)    CVA (cerebral infarction)    L sided   Dysrhythmia    Hyperlipidemia    Lymphadenopathy    Pituitary adenoma (HCC)    Presence of permanent cardiac pacemaker    Recurrent depression (HCC)    Shortness of breath dyspnea    Sick sinus syndrome (HCC)    Stroke ([place])      PAST SURGICAL HISTORY ([place]):  Past Surgical History:  Procedure Laterality Date   ABDOMINAL  HYSTERECTOMY     COLONOSCOPY WITH PROPOFOL N/A 01/05/2016   Procedure: COLONOSCOPY WITH PROPOFOL;  Surgeon: Lucilla Lame, MD;  Location: [facility] ENDOSCOPY;  Service: Endoscopy;  Laterality: N/A;   PACEMAKER INSERTION  2015   Dr. Josefa Half     MEDICATIONS:  Prior to Admission medications   Medication Sig Start Date End Date Taking? Authorizing Provider  ACCU-CHEK GUIDE test strip Use to check blood sugar 3 times a day and document for visits. Goal is <130 fasting and <180 two hours after meal., 09/01/22   [name], NP  Accu-Chek Softclix Lancets lancets Use to check blood sugar 3 times a day and document for visits. Goal is <130 fasting and <180 two hours after meal., 09/01/22   [name], NP  Ascorbic Acid (VITAMIN C) 1000 MG tablet Take by mouth. Every other day    [provider]  Blood Glucose Monitoring Suppl (ACCU-CHEK AVIVA PLUS) w/Device KIT Use to check blood sugar 3 times a day and document for visits.  Goal is <130 fasting and <180 two hours after meal. 07/15/21   Cannady, Jolene T, NP  chlorthalidone (HYGROTON) 25 MG tablet Take 1 tablet (25 mg total) by mouth daily. 10/03/22   [name], NP  Cholecalciferol (VITAMIN D3) 1000 units CAPS Take by mouth. Taking once a week    [provider]  clopidogrel (PLAVIX) 75 MG tablet Take 1 tablet (75 mg total)  by mouth daily. 10/03/22   [name], NP  Cyanocobalamin (VITAMIN B 12) 500 MCG TABS Take by mouth daily.    [provider]  diclofenac Sodium (VOLTAREN) 1 % GEL Apply 2 g topically 4 (four) times daily. 01/25/22   [name], NP  fluticasone furoate-vilanterol (BREO ELLIPTA) 100-25 MCG/INH AEPB Inhale 1 puff into the lungs daily. 07/12/21   [name], NP  gabapentin (NEURONTIN) 300 MG capsule Take 1 capsule (300 mg total) by mouth 3 (three) times daily. Patient not taking: Reported on 09/15/2022 08/23/22 09/22/22  [name], NP  JARDIANCE 10 MG TABS tablet Take 1  tablet (10 mg total) by mouth daily. 10/27/22   [name], NP  losartan (COZAAR) 100 MG tablet Take 1 tablet (100 mg total) by mouth daily. 10/05/22   [name], NP  potassium chloride SA (KLOR-CON M) 20 MEQ tablet Take 1 tablet (20 mEq total) by mouth 2 (two) times daily. 10/03/22   [name], NP  pyridOXINE (VITAMIN B-6) 25 MG tablet Take by mouth daily.     [provider]     ALLERGIES:  Allergies  Allergen Reactions   Elemental Sulfur Anaphylaxis   Levaquin [Levofloxacin] Other (See Comments)    Stroke   Tekturna [Aliskiren] Shortness Of Breath   Accupril [Quinapril Hcl]    Aspirin    Diovan [Valsartan]    Erythromycin    Iodine Hives   Latex     Can't remember reaction    Lotensin [Benazepril]    Norvasc [Amlodipine]    Shellfish Allergy Swelling   Sulfa Antibiotics    Tetanus Toxoids    Tetracycline     Other reaction(s): Unknown   Tetracyclines & Related    Penicillins Rash     SOCIAL HISTORY:  Social History   Socioeconomic History   Marital status: Married    Spouse name: Not on file   Number of children: Not on file   Years of education: Not on file   Highest education level: Not on file  Occupational History   Not on file  Tobacco Use   Smoking status: Never   Smokeless tobacco: Never  Vaping Use   Vaping Use: Never used  Substance and Sexual Activity   Alcohol use: No   Drug use: No   Sexual activity: Not on file  Other Topics Concern   Not on file  Social History Narrative   Not on file   Social Determinants of Health   Financial Resource Strain: Low Risk  (01/24/2022)   Overall Financial Resource Strain (CARDIA)    Difficulty of Paying Living Expenses: Not hard at all  Food Insecurity: No Food Insecurity (01/24/2022)   Hunger Vital Sign    Worried About Running Out of Food in the Last Year: Never true    Ran Out of Food in the Last Year: Never true  Transportation Needs: No Transportation Needs (01/24/2022)    PRAPARE - Hydrologist (Medical): No    Lack of Transportation (Non-Medical): No  Physical Activity: Inactive (01/24/2022)   Exercise Vital Sign    Days of Exercise per Week: 0 days    Minutes of Exercise per Session: 0 min  Stress: No Stress Concern Present (01/24/2022)   [place]    Feeling of Stress : Not at all  Social Connections: Moderately Isolated (01/24/2022)   Social Connection and Isolation Panel [NHANES]    Frequency of Communication  with Friends and Family: More than three times a week    Frequency of Social Gatherings with Friends and Family: Once a week    Attends Religious Services: Never    Marine scientist or Organizations: No    Attends Archivist Meetings: Never    Marital Status: Married  Human resources officer Violence: Not At Risk (01/24/2022)   Humiliation, Afraid, Rape, and Kick questionnaire    Fear of Current or Ex-Partner: No    Emotionally Abused: No    Physically Abused: No    Sexually Abused: No      FAMILY HISTORY:  Family History  Problem Relation Age of Onset   Hypertension Mother    Hypertension Father    Diabetes Father        under control   Hypertension Maternal Grandmother    Depression Maternal Grandmother    Hypertension Maternal Grandfather    Hypertension Paternal Grandmother    Hypertension Paternal Grandfather    Thyroid disease Maternal Aunt      REVIEW OF SYSTEMS:  Constitutional: denies weight loss, fever, chills, or sweats  Eyes: denies any other vision changes, history of eye injury  ENT: denies sore throat, hearing problems  Respiratory: denies shortness of breath, wheezing  Cardiovascular: denies chest pain, palpitations  Gastrointestinal: Positive abdominal pain, nausea and vomiting Genitourinary: denies burning with urination or urinary frequency Musculoskeletal: denies any other joint pains or cramps  Skin:  denies any other rashes or skin discolorations  Neurological: denies any other headache, dizziness, weakness  Psychiatric: denies any other depression, anxiety   All other review of systems were negative   VITAL SIGNS:  Temp:  [98.2 F (36.8 C)-98.3 F (36.8 C)] 98.2 F (36.8 C) (01/10 1351) Pulse Rate:  [71-81] 71 (01/10 1700) Resp:  [17-20] 17 (01/10 1630) BP: (112-160)/(61-96) 112/78 (01/10 1700) SpO2:  [92 %-96 %] 92 % (01/10 1630) Weight:  [154.2 kg] 154.2 kg (01/10 0941)     Height: '5\' 2"'$  (157.5 cm) Weight: (!) 154.2 kg BMI (Calculated): 62.17   INTAKE/OUTPUT:  This shift: No intake/output data recorded.  Last 2 shifts: '@IOLAST2SHIFTS'$ @   PHYSICAL EXAM:  Constitutional:  -- Morbidly obese -- Awake, alert, and oriented x3  Eyes:  -- Pupils equally round and reactive to light  -- No scleral icterus  Ear, nose, and throat:  -- No jugular venous distension  Pulmonary:  -- No crackles  -- Equal breath sounds bilaterally -- Breathing non-labored at rest Cardiovascular:  -- S1, S2 present  -- No pericardial rubs Gastrointestinal:  -- Abdomen soft, mild tender tender in the right upper quadrant no guarding or rebound tenderness.  Unreliable physical exam due to body habitus Musculoskeletal and Integumentary:  -- Wounds: None appreciated -- Extremities: B/L UE and LE FROM, hands and feet warm, with peripheral edema  Neurologic:  -- Motor function: intact and symmetric -- Sensation: intact and symmetric   Labs:     Latest Ref Rng & Units 11/23/2022    9:44 AM 01/21/2022    1:38 PM 12/29/2020   11:01 AM  CBC  WBC 4.0 - 10.5 K/uL 8.8  6.6  6.9   Hemoglobin 12.0 - 15.0 g/dL 15.6  14.9  13.8   Hematocrit 36.0 - 46.0 % 48.6  43.8  42.7   Platelets 150 - 400 K/uL 320  311  310       Latest Ref Rng & Units 11/23/2022    9:44 AM 07/25/2022   10:06 AM 01/21/2022  1:38 PM  CMP  Glucose 70 - 99 mg/dL 177  127  96   BUN 8 - 23 mg/dL '16  15  16   '$ Creatinine 0.44 - 1.00  mg/dL 1.43  1.04  1.16   Sodium 135 - 145 mmol/L 137  139  138   Potassium 3.5 - 5.1 mmol/L 3.3  3.7  3.7   Chloride 98 - 111 mmol/L 98  99  97   CO2 22 - 32 mmol/L '27  26  24   '$ Calcium 8.9 - 10.3 mg/dL 9.4  9.2  9.8   Total Protein 6.5 - 8.1 g/dL 9.5  7.6  7.7   Total Bilirubin 0.3 - 1.2 mg/dL 1.6  0.8  0.5   Alkaline Phos 38 - 126 U/L 166  117  78   AST 15 - 41 U/L 218  28  17   ALT 0 - 44 U/L 273  77  16     Imaging studies:  EXAM: ULTRASOUND ABDOMEN LIMITED RIGHT UPPER QUADRANT   COMPARISON:  CT 07/13/2014   FINDINGS: Gallbladder:   Sludge and small mobile stones in the gallbladder. No Murphy sign. Largest stone measures 10 mm. No wall thickening or adjacent fluid.   Common bile duct:   Diameter: 4.9 mm, within normal limits.   Liver:   Mildly heterogeneous echo pattern suggesting steatosis. No focal lesion evident. No ductal dilatation. Portal vein is patent on color Doppler imaging with normal direction of blood flow towards the liver.   Other: None.   IMPRESSION: 1. Sludge and small mobile stones in the gallbladder. No wall thickening or adjacent fluid. No Murphy sign. 2. Mild hepatic steatosis.     Electronically Signed   By: Nelson Chimes M.D.   On: 11/23/2022 10:37  Assessment/Plan:  67 y.o. female with cholelithiasis with elevated bilirubin, complicated by pertinent comorbidities including morbid obesity of 62 BMI, SA node dysfunction with pacemaker, history of stroke x 2, hypertension, chronic kidney disease.  Clinically patient with upper abdominal pain with abdominal ultrasound with normal gallbladder with thickening with gallbladder sludge and small stones.  She has elevated bilirubin, elevated alkaline phosphatase and elevated AST/ALT.  This is concerning for choledocholithiasis.  Patient unable to get MRCP due to pacemaker in place.  Initially recommended transfer to tertiary center for endoscopic ultrasound to rule out choledocholithiasis.  GI from  [facility] recommended CT scan of the abdomen pancreatic protocol for evaluation of biliary duct.  CT scan of the abdomen pancreatic protocol did not shows any abnormality of the biliary ducts.  In my opinion this is not the optimal imaging to rule out choledocholithiasis.    The only option that I can offer this patient is to do cholecystectomy with operative cholangiogram.  I personally discussed case with the anesthesia team and due to her BMI of 62, SA node dysfunction with pacemaker, chronic kidney disease, currently getting workup for sleep apnea they advised as patient is high risk for anesthesia in our institution.  As per discussion with patient and family members including husband they requested transfer to [facility].  Even if I am able to perform cholecystectomy with cholangiogram and patient positive for choledocholithiasis patient still will need transfer due to unavailability of ERCP in this institution at this moment.  If patient is not able to be transfer, will recommend admission to medical service for treatment with antibiotic therapy and repeating labs to assess bilirubin and alkaline phosphatase trend.  I will be readily available for  any discussion of his case.  I spent 60 minutes dedicated to the care of this patient on the date of this encounter to include pre-evaluation review of records, multiple case discussion with ED physicians, face-to-face time with the patient discussing diagnosis and management, and any coordination of care.    Arnold Long, MD

## 2022-11-23 NOTE — ED Notes (Signed)
called to [facility] per MD Siadecki/rep:kim/imaging powershared & facesheet faxed

## 2022-11-23 NOTE — ED Provider Notes (Signed)
[facility] Provider Note    Event Date/Time   First MD Initiated Contact with Patient 11/23/22 1037     (approximate)   History   Abdominal Pain   HPI  Mary Webb is a 67 y.o. female   here with abdominal pain. Pt states that pain began at around 1:30 this AM as acute onset severe epigastric and RUQ abd pain. Since then, pain has continued and worsened. Radiates to her back. She has had associated nausea, but no vomiting. Had similar sx 2 weeks ago. No fevers. No suspicious food intake or recent sick contacts.      Physical Exam   Triage Vital Signs: ED Triage Vitals  Enc Vitals Group     BP 11/23/22 0943 (!) 159/96     Pulse Rate 11/23/22 0943 81     Resp 11/23/22 0943 20     Temp 11/23/22 0943 98.3 F (36.8 C)     Temp src --      SpO2 11/23/22 0943 96 %     Weight 11/23/22 0941 (!) 340 lb (154.2 kg)     Height 11/23/22 0941 '5\' 2"'$  (1.575 m)     Head Circumference --      Peak Flow --      Pain Score 11/23/22 0941 10     Pain Loc --      Pain Edu? --      Excl. in [place]? --     Most recent vital signs: Vitals:   11/23/22 1700 11/23/22 1946  BP: 112/78 119/67  Pulse: 71 75  Resp:  20  Temp:  98.1 F (36.7 C)  SpO2:  94%     General: Awake, no distress.  CV:  Good peripheral perfusion. RRR. Resp:  Normal effort. Lungs CTAB. Abd:  No distention. Significant epigastric and RUQ TTP. No rebound or guarding. Other:  No LE edema.   ED Results / Procedures / Treatments   Labs (all labs ordered are listed, but only abnormal results are displayed) Labs Reviewed  COMPREHENSIVE METABOLIC PANEL - Abnormal; Notable for the following components:      Result Value   Potassium 3.3 (*)    Glucose, Bld 177 (*)    Creatinine, Ser 1.43 (*)    Total Protein 9.5 (*)    AST 218 (*)    ALT 273 (*)    Alkaline Phosphatase 166 (*)    Total Bilirubin 1.6 (*)    GFR, Estimated 40 (*)    All other components within normal limits  CBC -  Abnormal; Notable for the following components:   RBC 5.14 (*)    Hemoglobin 15.6 (*)    HCT 48.6 (*)    All other components within normal limits  RESP PANEL BY RT-PCR (RSV, FLU A&B, COVID)  RVPGX2  LIPASE, BLOOD  URINALYSIS, ROUTINE W REFLEX MICROSCOPIC  TROPONIN I (HIGH SENSITIVITY)     EKG Ventricular paced rhythm, VR 83. QRS 176, QTc 519. No acute ST elevations or depressions. No ischemia.   RADIOLOGY [place]: Sludge, small mobile stones, no chole CT A/P: Pending   I also independently reviewed and agree with radiologist interpretations.   PROCEDURES:  Critical Care performed: No   MEDICATIONS ORDERED IN ED: Medications  ondansetron (ZOFRAN) injection 4 mg (4 mg Intravenous Given 11/23/22 1139)  sodium chloride 0.9 % bolus 500 mL (0 mLs Intravenous Stopped 11/23/22 1325)  HYDROmorphone (DILAUDID) injection 0.5 mg (0.5 mg Intravenous Given 11/23/22 1140)  HYDROmorphone (  DILAUDID) injection 0.5 mg (0.5 mg Intravenous Given 11/23/22 1329)  methylPREDNISolone sodium succinate (SOLU-MEDROL) 40 mg/mL injection 40 mg (40 mg Intravenous Given 11/23/22 1355)  diphenhydrAMINE (BENADRYL) capsule 25 mg (25 mg Oral Given 11/23/22 1701)    Or  diphenhydrAMINE (BENADRYL) injection 25 mg ( Intravenous See Alternative 11/23/22 1701)  iohexol (OMNIPAQUE) 300 MG/ML solution 80 mL (80 mLs Intravenous Contrast Given 11/23/22 1811)     IMPRESSION / MDM / ASSESSMENT AND PLAN / ED COURSE  I reviewed the triage vital signs and the nursing notes.                              Differential diagnosis includes, but is not limited to, cholecystitis, symptomatic biliary colic, choledocho, cholangitis, pancreatitis, hepatitis, enteritis, SBO.  Patient's presentation is most consistent with acute presentation with potential threat to life or bodily function.  The patient is on the cardiac monitor to evaluate for evidence of arrhythmia and/or significant heart rate changes.  67 yo F with h/o obesity, CAD,  CVA, here with abdominal pain. Clinically suspect cholecystitis/biliary colic, with significant RUQ TTP. Labwork shows new transaminitis and elevated AlkP and bilirubin. WBC normal. US obtained shows sludge but no signs of cholecystitis. Pt has ongoing pain in this area.  Discussed with Dr. Peyton Webb who recommended MRCP for eval of choledocho. No signs of gallstone pancreatitis. Discussed with Dr. Lyndel Webb of [facility] because pt cannot obtain MRI here 2/2 pacemaker. He recommends CT A/P with contrast here. If negative, can likely manage here with HIDA/surgical consultation. If positive for ductal dilatation, will need transfer to [facility]/tertiary center if Dr. Allen Webb not Mary Webb.     FINAL CLINICAL IMPRESSION(S) / ED DIAGNOSES   Final diagnoses:  Cholecystitis     Rx / DC Orders   ED Discharge Orders     None        Note:  This document was prepared using Dragon voice recognition software and may include unintentional dictation errors.   Mary Bruce, MD 11/23/22 2116

## 2022-11-23 NOTE — ED Notes (Signed)
Benadryl to be given at 1800.

## 2022-11-23 NOTE — Progress Notes (Signed)
Plan of Care Note for accepted transfer   Patient: Mary Webb MRN: [id number]   [place]: 11/23/2022  Facility requesting transfer: [facility]   Requesting Provider: Dr. Cherylann Banas  Reason for transfer: ?acute cholecystitis, ?choledocholithiasis or passed stone   Facility course: 67 yr old lady with HTN, T2DM, CVA, SSS with pacer, and BMI 62 presents with acute-onset epigastric pain and nausea. She is found to have transaminases in 200s and t bili 1.6. There is no fever or leukocytosis and lipase is normal.    She could not get MRCP d/t pacemaker. RUQ [place] with sludge and stones but no CBD dilation. CT with stranding about the gallbladder but no biliary ductal dilatation.   Surgery/anesthesia at [facility] felt patient to be too high risk for surgery there. Dr. Zenia Resides of surgery at [facility] agreed to see the patient in consultation.   Plan of care: The patient is accepted for admission to [place]  unit, at [facility].   Author: Vianne Bulls, MD 11/23/2022  Check [email or web address] for on-call coverage.  Nursing staff, Please call [place] number on Amion as soon as patient's arrival, so appropriate admitting provider can evaluate the pt.

## 2022-11-24 ENCOUNTER — Inpatient Hospital Stay (HOSPITAL_COMMUNITY)
Admission: AD | Admit: 2022-11-24 | Discharge: 2022-11-30 | DRG: 418 | Disposition: A | Discharge status: Home Health | Payer: Medicare Other | Attending: Internal Medicine | Admitting: Internal Medicine

## 2022-11-24 ENCOUNTER — Ambulatory Visit: Admission: RE | Admit: 2022-11-24 | Payer: Medicare Other | Source: Home / Self Care | Admitting: Cardiology

## 2022-11-24 ENCOUNTER — Encounter (HOSPITAL_COMMUNITY): Payer: Self-pay | Admitting: Family Medicine

## 2022-11-24 ENCOUNTER — Encounter: Admission: RE | Payer: Self-pay | Source: Home / Self Care

## 2022-11-24 DIAGNOSIS — Z91040 Latex allergy status: Secondary | ICD-10-CM

## 2022-11-24 DIAGNOSIS — Z6841 Body Mass Index (BMI) 40.0 and over, adult: Secondary | ICD-10-CM | POA: Diagnosis not present

## 2022-11-24 DIAGNOSIS — Z887 Allergy status to serum and vaccine status: Secondary | ICD-10-CM | POA: Diagnosis not present

## 2022-11-24 DIAGNOSIS — Z91041 Radiographic dye allergy status: Secondary | ICD-10-CM | POA: Diagnosis not present

## 2022-11-24 DIAGNOSIS — Z79899 Other long term (current) drug therapy: Secondary | ICD-10-CM

## 2022-11-24 DIAGNOSIS — K819 Cholecystitis, unspecified: Secondary | ICD-10-CM | POA: Diagnosis not present

## 2022-11-24 DIAGNOSIS — Z7984 Long term (current) use of oral hypoglycemic drugs: Secondary | ICD-10-CM | POA: Diagnosis not present

## 2022-11-24 DIAGNOSIS — E662 Morbid (severe) obesity with alveolar hypoventilation: Secondary | ICD-10-CM | POA: Diagnosis present

## 2022-11-24 DIAGNOSIS — Z833 Family history of diabetes mellitus: Secondary | ICD-10-CM

## 2022-11-24 DIAGNOSIS — Z7902 Long term (current) use of antithrombotics/antiplatelets: Secondary | ICD-10-CM

## 2022-11-24 DIAGNOSIS — E785 Hyperlipidemia, unspecified: Secondary | ICD-10-CM | POA: Diagnosis not present

## 2022-11-24 DIAGNOSIS — K8010 Calculus of gallbladder with chronic cholecystitis without obstruction: Secondary | ICD-10-CM | POA: Diagnosis not present

## 2022-11-24 DIAGNOSIS — I447 Left bundle-branch block, unspecified: Secondary | ICD-10-CM | POA: Diagnosis present

## 2022-11-24 DIAGNOSIS — R1013 Epigastric pain: Secondary | ICD-10-CM | POA: Diagnosis not present

## 2022-11-24 DIAGNOSIS — N183 Chronic kidney disease, stage 3 unspecified: Secondary | ICD-10-CM | POA: Diagnosis present

## 2022-11-24 DIAGNOSIS — R7989 Other specified abnormal findings of blood chemistry: Principal | ICD-10-CM | POA: Diagnosis present

## 2022-11-24 DIAGNOSIS — K831 Obstruction of bile duct: Secondary | ICD-10-CM | POA: Diagnosis not present

## 2022-11-24 DIAGNOSIS — E8770 Fluid overload, unspecified: Secondary | ICD-10-CM | POA: Diagnosis not present

## 2022-11-24 DIAGNOSIS — Z7951 Long term (current) use of inhaled steroids: Secondary | ICD-10-CM

## 2022-11-24 DIAGNOSIS — Z882 Allergy status to sulfonamides status: Secondary | ICD-10-CM

## 2022-11-24 DIAGNOSIS — Z45010 Encounter for checking and testing of cardiac pacemaker pulse generator [battery]: Secondary | ICD-10-CM

## 2022-11-24 DIAGNOSIS — R1084 Generalized abdominal pain: Secondary | ICD-10-CM

## 2022-11-24 DIAGNOSIS — Z818 Family history of other mental and behavioral disorders: Secondary | ICD-10-CM

## 2022-11-24 DIAGNOSIS — Z01810 Encounter for preprocedural cardiovascular examination: Secondary | ICD-10-CM

## 2022-11-24 DIAGNOSIS — M1990 Unspecified osteoarthritis, unspecified site: Secondary | ICD-10-CM | POA: Diagnosis not present

## 2022-11-24 DIAGNOSIS — K828 Other specified diseases of gallbladder: Secondary | ICD-10-CM | POA: Diagnosis not present

## 2022-11-24 DIAGNOSIS — E1122 Type 2 diabetes mellitus with diabetic chronic kidney disease: Secondary | ICD-10-CM | POA: Diagnosis not present

## 2022-11-24 DIAGNOSIS — K82A1 Gangrene of gallbladder in cholecystitis: Secondary | ICD-10-CM | POA: Diagnosis not present

## 2022-11-24 DIAGNOSIS — Z01818 Encounter for other preprocedural examination: Secondary | ICD-10-CM

## 2022-11-24 DIAGNOSIS — Z90710 Acquired absence of both cervix and uterus: Secondary | ICD-10-CM | POA: Diagnosis not present

## 2022-11-24 DIAGNOSIS — E876 Hypokalemia: Secondary | ICD-10-CM | POA: Diagnosis present

## 2022-11-24 DIAGNOSIS — I129 Hypertensive chronic kidney disease with stage 1 through stage 4 chronic kidney disease, or unspecified chronic kidney disease: Secondary | ICD-10-CM | POA: Diagnosis present

## 2022-11-24 DIAGNOSIS — Z880 Allergy status to penicillin: Secondary | ICD-10-CM

## 2022-11-24 DIAGNOSIS — Z888 Allergy status to other drugs, medicaments and biological substances status: Secondary | ICD-10-CM

## 2022-11-24 DIAGNOSIS — Z881 Allergy status to other antibiotic agents status: Secondary | ICD-10-CM | POA: Diagnosis not present

## 2022-11-24 DIAGNOSIS — Z1152 Encounter for screening for COVID-19: Secondary | ICD-10-CM | POA: Diagnosis not present

## 2022-11-24 DIAGNOSIS — K429 Umbilical hernia without obstruction or gangrene: Secondary | ICD-10-CM | POA: Diagnosis present

## 2022-11-24 DIAGNOSIS — I69354 Hemiplegia and hemiparesis following cerebral infarction affecting left non-dominant side: Secondary | ICD-10-CM

## 2022-11-24 DIAGNOSIS — I10 Essential (primary) hypertension: Secondary | ICD-10-CM | POA: Diagnosis not present

## 2022-11-24 DIAGNOSIS — I495 Sick sinus syndrome: Secondary | ICD-10-CM | POA: Diagnosis not present

## 2022-11-24 DIAGNOSIS — Z950 Presence of cardiac pacemaker: Secondary | ICD-10-CM | POA: Diagnosis present

## 2022-11-24 DIAGNOSIS — K5900 Constipation, unspecified: Secondary | ICD-10-CM | POA: Diagnosis present

## 2022-11-24 DIAGNOSIS — N1831 Chronic kidney disease, stage 3a: Secondary | ICD-10-CM | POA: Diagnosis not present

## 2022-11-24 DIAGNOSIS — I2510 Atherosclerotic heart disease of native coronary artery without angina pectoris: Secondary | ICD-10-CM | POA: Diagnosis present

## 2022-11-24 DIAGNOSIS — N189 Chronic kidney disease, unspecified: Secondary | ICD-10-CM | POA: Diagnosis not present

## 2022-11-24 DIAGNOSIS — K8012 Calculus of gallbladder with acute and chronic cholecystitis without obstruction: Secondary | ICD-10-CM | POA: Diagnosis not present

## 2022-11-24 DIAGNOSIS — Z91013 Allergy to seafood: Secondary | ICD-10-CM

## 2022-11-24 DIAGNOSIS — R1011 Right upper quadrant pain: Secondary | ICD-10-CM | POA: Diagnosis not present

## 2022-11-24 DIAGNOSIS — R109 Unspecified abdominal pain: Secondary | ICD-10-CM | POA: Diagnosis present

## 2022-11-24 DIAGNOSIS — Z9049 Acquired absence of other specified parts of digestive tract: Secondary | ICD-10-CM | POA: Diagnosis not present

## 2022-11-24 DIAGNOSIS — Z886 Allergy status to analgesic agent status: Secondary | ICD-10-CM

## 2022-11-24 DIAGNOSIS — Z8673 Personal history of transient ischemic attack (TIA), and cerebral infarction without residual deficits: Secondary | ICD-10-CM

## 2022-11-24 DIAGNOSIS — R112 Nausea with vomiting, unspecified: Secondary | ICD-10-CM | POA: Diagnosis present

## 2022-11-24 DIAGNOSIS — K8020 Calculus of gallbladder without cholecystitis without obstruction: Secondary | ICD-10-CM | POA: Diagnosis not present

## 2022-11-24 DIAGNOSIS — Z8249 Family history of ischemic heart disease and other diseases of the circulatory system: Secondary | ICD-10-CM

## 2022-11-24 DIAGNOSIS — K8066 Calculus of gallbladder and bile duct with acute and chronic cholecystitis without obstruction: Secondary | ICD-10-CM | POA: Diagnosis not present

## 2022-11-24 LAB — CBC WITH DIFFERENTIAL/PLATELET
Abs Immature Granulocytes: 0.04 10*3/uL (ref 0.00–0.07)
Basophils Absolute: 0 10*3/uL (ref 0.0–0.1)
Basophils Relative: 0 %
Eosinophils Absolute: 0 10*3/uL (ref 0.0–0.5)
Eosinophils Relative: 0 %
HCT: 43.8 % (ref 36.0–46.0)
Hemoglobin: 14.5 g/dL (ref 12.0–15.0)
Immature Granulocytes: 0 %
Lymphocytes Relative: 10 %
Lymphs Abs: 1.3 10*3/uL (ref 0.7–4.0)
MCH: 31 pg (ref 26.0–34.0)
MCHC: 33.1 g/dL (ref 30.0–36.0)
MCV: 93.6 fL (ref 80.0–100.0)
Monocytes Absolute: 1.3 10*3/uL — ABNORMAL HIGH (ref 0.1–1.0)
Monocytes Relative: 10 %
Neutro Abs: 10.1 10*3/uL — ABNORMAL HIGH (ref 1.7–7.7)
Neutrophils Relative %: 80 %
Platelets: 306 10*3/uL (ref 150–400)
RBC: 4.68 MIL/uL (ref 3.87–5.11)
RDW: 13.8 % (ref 11.5–15.5)
WBC: 12.7 10*3/uL — ABNORMAL HIGH (ref 4.0–10.5)
nRBC: 0 % (ref 0.0–0.2)

## 2022-11-24 LAB — GLUCOSE, CAPILLARY
Glucose-Capillary: 132 mg/dL — ABNORMAL HIGH (ref 70–99)
Glucose-Capillary: 138 mg/dL — ABNORMAL HIGH (ref 70–99)
Glucose-Capillary: 145 mg/dL — ABNORMAL HIGH (ref 70–99)
Glucose-Capillary: 154 mg/dL — ABNORMAL HIGH (ref 70–99)
Glucose-Capillary: 155 mg/dL — ABNORMAL HIGH (ref 70–99)
Glucose-Capillary: 155 mg/dL — ABNORMAL HIGH (ref 70–99)

## 2022-11-24 LAB — MAGNESIUM: Magnesium: 2.5 mg/dL — ABNORMAL HIGH (ref 1.7–2.4)

## 2022-11-24 LAB — COMPREHENSIVE METABOLIC PANEL
ALT: 234 U/L — ABNORMAL HIGH (ref 0–44)
AST: 213 U/L — ABNORMAL HIGH (ref 15–41)
Albumin: 3.2 g/dL — ABNORMAL LOW (ref 3.5–5.0)
Alkaline Phosphatase: 125 U/L (ref 38–126)
Anion gap: 13 (ref 5–15)
BUN: 10 mg/dL (ref 8–23)
CO2: 28 mmol/L (ref 22–32)
Calcium: 8.8 mg/dL — ABNORMAL LOW (ref 8.9–10.3)
Chloride: 97 mmol/L — ABNORMAL LOW (ref 98–111)
Creatinine, Ser: 1.15 mg/dL — ABNORMAL HIGH (ref 0.44–1.00)
GFR, Estimated: 52 mL/min — ABNORMAL LOW (ref >60)
Glucose, Bld: 112 mg/dL — ABNORMAL HIGH (ref 70–99)
Potassium: 3.2 mmol/L — ABNORMAL LOW (ref 3.5–5.1)
Sodium: 138 mmol/L (ref 135–145)
Total Bilirubin: 1.5 mg/dL — ABNORMAL HIGH (ref 0.3–1.2)
Total Protein: 7.5 g/dL (ref 6.5–8.1)

## 2022-11-24 LAB — SURGICAL PCR SCREEN
MRSA, PCR: NEGATIVE
Staphylococcus aureus: NEGATIVE

## 2022-11-24 SURGERY — PPM GENERATOR CHANGEOUT
Anesthesia: Moderate Sedation

## 2022-11-24 MED ORDER — ONDANSETRON HCL 4 MG/2ML IJ SOLN: 4.0000 mg | Freq: Four times a day (QID) | INTRAMUSCULAR | Status: DC | PRN

## 2022-11-24 MED ORDER — MUPIROCIN 2 % EX OINT
1.0000 | TOPICAL_OINTMENT | Freq: Two times a day (BID) | CUTANEOUS | Status: AC
  Administered 2022-11-24 – 2022-11-28 (×9): 1 via NASAL
  Filled 2022-11-24: qty 22

## 2022-11-24 MED ORDER — POTASSIUM CHLORIDE IN NACL 20-0.9 MEQ/L-% IV SOLN
INTRAVENOUS | Status: DC
  Filled 2022-11-24 (×7): qty 1000

## 2022-11-24 MED ORDER — INSULIN ASPART 100 UNIT/ML IJ SOLN
0.0000 [IU] | INTRAMUSCULAR | Status: DC
  Administered 2022-11-24: 3 [IU] via SUBCUTANEOUS
  Administered 2022-11-24 – 2022-11-26 (×7): 2 [IU] via SUBCUTANEOUS
  Administered 2022-11-26 (×2): 3 [IU] via SUBCUTANEOUS
  Administered 2022-11-26 – 2022-11-27 (×3): 2 [IU] via SUBCUTANEOUS
  Administered 2022-11-27 (×2): 3 [IU] via SUBCUTANEOUS
  Administered 2022-11-27 – 2022-11-28 (×2): 2 [IU] via SUBCUTANEOUS
  Administered 2022-11-28 (×2): 3 [IU] via SUBCUTANEOUS
  Administered 2022-11-29 (×2): 2 [IU] via SUBCUTANEOUS
  Administered 2022-11-29: 3 [IU] via SUBCUTANEOUS
  Administered 2022-11-30 (×2): 2 [IU] via SUBCUTANEOUS

## 2022-11-24 MED ORDER — HYDROMORPHONE HCL 1 MG/ML IJ SOLN
INTRAMUSCULAR | Status: AC
  Administered 2022-11-24: 1 mg via INTRAVENOUS
  Filled 2022-11-24: qty 1

## 2022-11-24 MED ORDER — DIPHENHYDRAMINE HCL 25 MG PO CAPS
25.0000 mg | ORAL_CAPSULE | ORAL | Status: DC | PRN
  Administered 2022-11-24 – 2022-11-28 (×5): 25 mg via ORAL
  Filled 2022-11-24 (×5): qty 1

## 2022-11-24 MED ORDER — HYDRALAZINE HCL 20 MG/ML IJ SOLN: 5.0000 mg | INTRAMUSCULAR | Status: DC | PRN

## 2022-11-24 MED ORDER — SENNOSIDES-DOCUSATE SODIUM 8.6-50 MG PO TABS: 1.0000 | ORAL_TABLET | Freq: Every evening | ORAL | Status: DC | PRN

## 2022-11-24 MED ORDER — HYDROMORPHONE HCL 1 MG/ML IJ SOLN: 1.0000 mg | Freq: Once | INTRAMUSCULAR | Status: AC

## 2022-11-24 MED ORDER — LOSARTAN POTASSIUM 50 MG PO TABS
100.0000 mg | ORAL_TABLET | Freq: Every day | ORAL | Status: DC
  Administered 2022-11-24 – 2022-11-30 (×7): 100 mg via ORAL
  Filled 2022-11-24 (×8): qty 2

## 2022-11-24 MED ORDER — HYDROMORPHONE HCL 1 MG/ML IJ SOLN
1.0000 mg | INTRAMUSCULAR | Status: DC | PRN
  Administered 2022-11-24 – 2022-11-28 (×13): 1 mg via INTRAVENOUS
  Filled 2022-11-24 (×14): qty 1

## 2022-11-24 MED ORDER — ACETAMINOPHEN 325 MG PO TABS: 650.0000 mg | ORAL_TABLET | Freq: Four times a day (QID) | ORAL | Status: DC | PRN

## 2022-11-24 MED ORDER — SODIUM CHLORIDE 0.9 % IV SOLN
2.0000 g | INTRAVENOUS | Status: DC
  Administered 2022-11-24 – 2022-11-28 (×4): 2 g via INTRAVENOUS
  Filled 2022-11-24 (×3): qty 20

## 2022-11-24 MED ORDER — ENOXAPARIN SODIUM 40 MG/0.4ML IJ SOSY
40.0000 mg | PREFILLED_SYRINGE | INTRAMUSCULAR | Status: DC
  Administered 2022-11-24: 40 mg via SUBCUTANEOUS
  Filled 2022-11-24: qty 0.4

## 2022-11-24 MED ORDER — POTASSIUM CHLORIDE CRYS ER 20 MEQ PO TBCR
40.0000 meq | EXTENDED_RELEASE_TABLET | Freq: Once | ORAL | Status: AC
  Administered 2022-11-24: 40 meq via ORAL
  Filled 2022-11-24: qty 2

## 2022-11-24 MED ORDER — ACETAMINOPHEN 650 MG RE SUPP: 650.0000 mg | Freq: Four times a day (QID) | RECTAL | Status: DC | PRN

## 2022-11-24 MED ORDER — ONDANSETRON HCL 4 MG PO TABS: 4.0000 mg | ORAL_TABLET | Freq: Four times a day (QID) | ORAL | Status: DC | PRN

## 2022-11-24 MED ORDER — HYDROCHLOROTHIAZIDE 25 MG PO TABS
25.0000 mg | ORAL_TABLET | Freq: Every day | ORAL | Status: DC
  Administered 2022-11-24: 25 mg via ORAL
  Filled 2022-11-24: qty 1

## 2022-11-24 NOTE — Plan of Care (Signed)

## 2022-11-24 NOTE — Consult Note (Signed)
Mary Webb 05-08-1956  [id number].    Requesting MD: Dr. Ernesto Rutherford Chief Complaint/Reason for Consult: Abdominal pain, gallstones, elevated LFTs  HPI:  [name] is a 67 yo female with a history of CAD, CVA, CKD and sick sinus syndrome (with pacemaker) who presented to the ED at [facility] yesterday with abdominal pain. She says she first had a severe episode of upper abdominal pain about 3 weeks ago, and since then has been having intermittent pain. It tends to worsen with eating. It is in the epigastric area and radiates to the RUQ. Yesterday the pain persisted and did not go away, prompting her to go to the ED. She denies fevers, nausea and vomiting. Labs in the ED were significant for elevated transaminases and Tbili of 1.6. A  RUQ US showed sludge and stones in the gallbladder but no signs of cholecystitis. A CT scan showed mild pericholecystic stranding but no biliary ductal dilation. General surgery was consulted at [facility] and due to her cardiac comorbidities and possible need for ERCP, transfer to [facility] was recommended. Repeat LFTs are pending this morning.  The patient takes Plavix but stopped it 5 days ago for a planned pacemaker generator exchange today. Her only prior abdominal surgery is a hysterectomy.  ROS: Review of Systems  Constitutional:  Negative for chills and fever.  Respiratory:  Negative for shortness of breath.   Cardiovascular:  Negative for chest pain.  Gastrointestinal:  Positive for abdominal pain. Negative for nausea and vomiting.  Neurological:  Negative for loss of consciousness.    Family History  Problem Relation Age of Onset   Hypertension Mother    Hypertension Father    Diabetes Father        under control   Hypertension Maternal Grandmother    Depression Maternal Grandmother    Hypertension Maternal Grandfather    Hypertension Paternal Grandmother    Hypertension Paternal Grandfather    Thyroid disease Maternal Aunt     Past Medical History:   Diagnosis Date   Arthritis    CAD (coronary artery disease)    CKD (chronic kidney disease)    CVA (cerebral infarction)    L sided   Dysrhythmia    Hyperlipidemia    Lymphadenopathy    Pituitary adenoma (HCC)    Presence of permanent cardiac pacemaker    Recurrent depression (HCC)    Shortness of breath dyspnea    Sick sinus syndrome (HCC)    Stroke [facility])     Past Surgical History:  Procedure Laterality Date   ABDOMINAL HYSTERECTOMY     COLONOSCOPY WITH PROPOFOL N/A 01/05/2016   Procedure: COLONOSCOPY WITH PROPOFOL;  Surgeon: Lucilla Lame, MD;  Location: [facility] ENDOSCOPY;  Service: Endoscopy;  Laterality: N/A;   PACEMAKER INSERTION  2015   Dr. Josefa Half    Social History:  reports that she has never smoked. She has never used smokeless tobacco. She reports that she does not drink alcohol and does not use drugs.  Allergies:  Allergies  Allergen Reactions   Elemental Sulfur Anaphylaxis   Levaquin [Levofloxacin] Other (See Comments)    Stroke   Tekturna [Aliskiren] Shortness Of Breath   Accupril [Quinapril Hcl]    Aspirin    Diovan [Valsartan]    Erythromycin    Iodine Hives   Latex     Can't remember reaction    Lotensin [Benazepril]    Norvasc [Amlodipine]    Shellfish Allergy Swelling   Sulfa Antibiotics    Tetanus Toxoids  Tetracycline     Other reaction(s): Unknown   Tetracyclines & Related    Penicillins Rash    Medications Prior to Admission  Medication Sig Dispense Refill   ACCU-CHEK GUIDE test strip Use to check blood sugar 3 times a day and document for visits. Goal is <130 fasting and <180 two hours after meal., 200 strip 2   Accu-Chek Softclix Lancets lancets Use to check blood sugar 3 times a day and document for visits. Goal is <130 fasting and <180 two hours after meal., 200 each 2   Ascorbic Acid (VITAMIN C) 1000 MG tablet Take by mouth. Every other day     Blood Glucose Monitoring Suppl (ACCU-CHEK AVIVA PLUS) w/Device KIT Use to check blood  sugar 3 times a day and document for visits.  Goal is <130 fasting and <180 two hours after meal. 1 kit 1   chlorthalidone (HYGROTON) 25 MG tablet Take 1 tablet (25 mg total) by mouth daily. 90 tablet 1   Cholecalciferol (VITAMIN D3) 1000 units CAPS Take by mouth. Taking once a week     clopidogrel (PLAVIX) 75 MG tablet Take 1 tablet (75 mg total) by mouth daily. 90 tablet 1   Cyanocobalamin (VITAMIN B 12) 500 MCG TABS Take by mouth daily.     diclofenac Sodium (VOLTAREN) 1 % GEL Apply 2 g topically 4 (four) times daily. 100 g 1   fluticasone furoate-vilanterol (BREO ELLIPTA) 100-25 MCG/INH AEPB Inhale 1 puff into the lungs daily. 60 each 1   gabapentin (NEURONTIN) 300 MG capsule Take 1 capsule (300 mg total) by mouth 3 (three) times daily. (Patient not taking: Reported on 09/15/2022) 270 capsule 1   JARDIANCE 10 MG TABS tablet Take 1 tablet (10 mg total) by mouth daily. 30 tablet 0   losartan (COZAAR) 100 MG tablet Take 1 tablet (100 mg total) by mouth daily. 90 tablet 0   potassium chloride SA (KLOR-CON M) 20 MEQ tablet Take 1 tablet (20 mEq total) by mouth 2 (two) times daily. 180 tablet 1   pyridOXINE (VITAMIN B-6) 25 MG tablet Take by mouth daily.        Physical Exam: Blood pressure (!) 141/57, pulse 71, temperature 97.9 F (36.6 C), temperature source Oral, resp. rate 18, SpO2 97 %. General: resting comfortably, appears stated age, no apparent distress Neurological: alert and oriented, no focal deficits, cranial nerves grossly in tact HEENT: normocephalic, atraumatic Respiratory: normal work of breathing on nasal cannula Abdomen: soft, nondistended, mildly tender to palpation in epigastric area and RUQ. Umbilical hernia. Extremities: warm and well-perfused, no deformities, moving all extremities spontaneously Psychiatric: normal mood and affect Skin: warm and dry, no jaundice, no rashes or lesions   Results for orders placed or performed during the hospital encounter of 11/24/22  (from the past 48 hour(s))  CBC with Differential/Platelet     Status: Abnormal   Collection Time: 11/24/22  4:33 AM  Result Value Ref Range   WBC 12.7 (H) 4.0 - 10.5 K/uL   RBC 4.68 3.87 - 5.11 MIL/uL   Hemoglobin 14.5 12.0 - 15.0 g/dL   HCT 43.8 36.0 - 46.0 %   MCV 93.6 80.0 - 100.0 fL   MCH 31.0 26.0 - 34.0 pg   MCHC 33.1 30.0 - 36.0 g/dL   RDW 13.8 11.5 - 15.5 %   Platelets 306 150 - 400 K/uL   nRBC 0.0 0.0 - 0.2 %   Neutrophils Relative % 80 %   Neutro Abs 10.1 (H) 1.7 -  7.7 K/uL   Lymphocytes Relative 10 %   Lymphs Abs 1.3 0.7 - 4.0 K/uL   Monocytes Relative 10 %   Monocytes Absolute 1.3 (H) 0.1 - 1.0 K/uL   Eosinophils Relative 0 %   Eosinophils Absolute 0.0 0.0 - 0.5 K/uL   Basophils Relative 0 %   Basophils Absolute 0.0 0.0 - 0.1 K/uL   Immature Granulocytes 0 %   Abs Immature Granulocytes 0.04 0.00 - 0.07 K/uL    Comment: Performed at [facility] [address], [place], [place] [address]  Glucose, capillary     Status: Abnormal   Collection Time: 11/24/22  4:34 AM  Result Value Ref Range   Glucose-Capillary 132 (H) 70 - 99 mg/dL    Comment: Glucose reference range applies only to samples taken after fasting for at least 8 hours.   CT ABDOMEN PELVIS W CONTRAST  Result Date: 11/23/2022 CLINICAL DATA:  Abdominal pain EXAM: CT ABDOMEN AND PELVIS WITH CONTRAST TECHNIQUE: Multidetector CT imaging of the abdomen and pelvis was performed using the standard protocol following bolus administration of intravenous contrast. RADIATION DOSE REDUCTION: This exam was performed according to the departmental dose-optimization program which includes automated exposure control, adjustment of the mA and/or kV according to patient size and/or use of iterative reconstruction technique. CONTRAST:  2m OMNIPAQUE IOHEXOL 300 MG/ML  SOLN COMPARISON:  Noncontrast CT done on 07/13/2014 FINDINGS: Lower chest: There are small linear densities in lower lung fields suggesting scarring or  subsegmental atelectasis. Pacemaker leads are noted in place. Hepatobiliary: No focal abnormalities are seen in liver. There is mild stranding adjacent to the gallbladder. Gallbladder is distended. There is no dilation of bile ducts. Pancreas: No focal abnormalities are seen. Spleen: Unremarkable. Adrenals/Urinary Tract: Adrenals are unremarkable. There is no hydronephrosis. There is 2.9 cm smooth marginated fluid density lesion in the anterior midportion of left kidney suggesting renal cyst. No follow-up imaging is recommended. There are no renal or ureteral stones. Urinary bladder is unremarkable. Stomach/Bowel: Stomach is moderately distended with fluid. There is no significant wall thickening in stomach. Small bowel loops are not dilated. Appendix is not dilated. There is no significant wall thickening in colon. There is no pericolic stranding. Vascular/Lymphatic: Scattered arterial calcifications are seen. Reproductive: Uterus is not seen. There is 6.3 x 5.2 cm smooth marginated fluid density lesion in the right adnexa. This lesion has increased in size. There are no definite internal septations. Other: There is no ascites or pneumoperitoneum. There is large umbilical hernia containing fat. Musculoskeletal: Degenerative changes are noted in lower thoracic spine and lumbar spine. Degenerative changes are noted in right hip. No acute findings are noted. IMPRESSION: There is mild pericholecystic stranding. Please correlate with clinical symptoms and laboratory findings to evaluate for acute cholecystitis. Follow-up gallbladder sonogram may be considered. There is no evidence of intestinal obstruction or pneumoperitoneum. There is no hydronephrosis. Appendix is not dilated. There is 6.3 cm smooth marginated fluid density lesion in the right adnexa which has increased in size since 07/13/2014. In view of patient's age, follow-up pelvic sonogram may be considered for further characterization. Small linear densities  in the lower lung fields may suggest minimal scarring or subsegmental atelectasis. There is 2.9 cm smooth marginated fluid density lesion in the midportion of left kidney suggesting possible simple cysts. Other findings as described in the body of the report. Other findings as described in the body of the report. Electronically Signed   By: PElmer PickerM.D.   On: 11/23/2022  18:38   US Abdomen Limited RUQ (LIVER/GB)  Result Date: 11/23/2022 CLINICAL DATA:  Abdominal pain over the last day. EXAM: ULTRASOUND ABDOMEN LIMITED RIGHT UPPER QUADRANT COMPARISON:  CT 07/13/2014 FINDINGS: Gallbladder: Sludge and small mobile stones in the gallbladder. No Murphy sign. Largest stone measures 10 mm. No wall thickening or adjacent fluid. Common bile duct: Diameter: 4.9 mm, within normal limits. Liver: Mildly heterogeneous echo pattern suggesting steatosis. No focal lesion evident. No ductal dilatation. Portal vein is patent on color Doppler imaging with normal direction of blood flow towards the liver. Other: None. IMPRESSION: 1. Sludge and small mobile stones in the gallbladder. No wall thickening or adjacent fluid. No Murphy sign. 2. Mild hepatic steatosis. Electronically Signed   By: Nelson Chimes M.D.   On: 11/23/2022 10:37      Assessment/Plan 67 yo female presenting with acute epigastric/RUQ pain and elevated LFTs. I reviewed her labs, notes and imaging. There is no significant gallbladder inflammation or biliary ductal dilation on imaging, however given her symptoms she may have passed a stone via the common bile duct. If LFTs are significantly trending up today, recommend a GI consult for consideration of ERCP (patient cannot get an MRCP as pacemaker is not MRI-compatible). If LFTs are stable or downtrending, we will tentatively plan for lap cholecystectomy with intra-op cholangiogram. I reviewed this plan with the patient. Please keep NPO for now.   Michaelle Birks, MD [facility] General,  Hepatobiliary and Pancreatic Surgery 11/24/22 6:55 AM

## 2022-11-24 NOTE — Progress Notes (Signed)
Patient refused ambulation and removal of purewick today, patient feels she is in too much pain to walk at this time.

## 2022-11-24 NOTE — Progress Notes (Signed)
Patient admitted early this morning for abdominal pain and was found to have cholelithiasis with possible cholecystitis and has been transferred from [facility] to [facility] surgery evaluation appreciated.  Patient seen and examined at bedside and plan of care discussed with her.  Resume Rocephin.  Repeat a.m. labs.  Timing of surgical intervention to be decided by general surgery.  Continue IV fluids.

## 2022-11-24 NOTE — Consult Note (Signed)
Cardiology Consultation   Patient ID: Mary Webb MRN: [id number]; DOB: [birth year]  Admit date: 11/24/2022 Date of Consult: 11/24/2022  PCP:  Mary Billings, NP   [place] Providers Cardiologist:  None  -- Followed by [facility]    Patient Profile:   Mary Webb is a 67 y.o. female with a hx of SA node dysfunction s/p PPM (Medtronic, 2015), chronic LBBB, OSA, history of CVA x2, HTN, HLD, prediabetes, CDK stage IIIb, pituitary adenoma, morbid obesity who is being seen 11/24/2022 for preoperative evaluation at the request of Dr. Starla Link.  History of Present Illness:   Mary Webb is a 67 year old female with above medical history who is followed by [facility]. Per chart review patient established care with [facility] in 06/2014 for evaluation of bradycardia. Had a medtronic pacemaker implanted in 06/2014. Echocardiogram on 07/08/14 showed EF 45-50%, normal RV systolic function, mild tricuspid valve regurgitation, mild mitral valve regurgitation, mild-moderate LVH. Nuclear stress test on 09/19/2014 showed normal myocardial thickening and wall motion (although was a poor quality study). Echocardiogram on 03/11/2022 showed EF 50-55%, no regional wall motion abnormalities, grade I diastolic dysfunction, normal RV systolic function.   Patient was seen by her cardiologist on 11/09/22 for preop evaluation. She reported that her PPM had been checked in 09/2022, and she was told it was time to change the generator. Reported that her swelling was well controlled, denied chest pain, sob, orthopnea, PND, palpitations, syncope, near syncope. She was scheduled for PPM generator changeout on 1/11/   Patient presented to the ED at [facility] on 11/10 complaining of epigastric pain that started the night before. Also complained of vomiting. Abdominal ultrasound showed sludge and small mobile stones in the gallbladder, no wall thickening or adjacent fluid. CT abdomen/pelvis  showed mild pericholecystic stranding, gallbaldder distended. Labs in the ED showed Na 137, K 3.3, creatinine 1.43, alkaline phosphatase 166, AST 218, ALG 273. WBC 8.8, hemoglobin 15.6, platelets 320. hsTn 14. Patient was evaluated by general surgery who recommended laparoscopic cholecystectomy and possible cholangiogram. Surgery scheduled for 1/12. Cardiology asked to see for cardiovascular risk assessment.   On interview, patient denies having recent episodes of chest pain. Her activities are somewhat limited due to her weight, knee pain. However, she is able to walk around her home, cook for herself, and do chores around her home. Does not get chest pain with these activities. Occasionally has some SOB when walking far distances. She has chronic lower extremity edema, L>R, that has been stable. Denies cough, palpitation, syncope, near syncope, dizziness. Understands that her weight increases her surgical risk. Reports that she had a nuclear stress test in 2015, and was told that everything was fine at that time.  Past Medical History:  Diagnosis Date   Arthritis    CAD (coronary artery disease)    CKD (chronic kidney disease)    CVA (cerebral infarction)    L sided   Dysrhythmia    Hyperlipidemia    Lymphadenopathy    Nausea and vomiting 09/15/2022   Pituitary adenoma ([place])    Presence of permanent cardiac pacemaker    Recurrent depression (HCC)    Shortness of breath dyspnea    Sick sinus syndrome (HCC)    Stroke [facility])     Past Surgical History:  Procedure Laterality Date   ABDOMINAL HYSTERECTOMY     COLONOSCOPY WITH PROPOFOL N/A 01/05/2016   Procedure: COLONOSCOPY WITH PROPOFOL;  Surgeon: Lucilla Lame, MD;  Location: [facility] ENDOSCOPY;  Service:  Endoscopy;  Laterality: N/A;   PACEMAKER INSERTION  2015   Dr. Josefa Half     Home Medications:  Prior to Admission medications   Medication Sig Start Date End Date Taking? Authorizing Provider  acetaminophen (TYLENOL) 500 MG tablet Take  1,000 mg by mouth every 6 (six) hours as needed for mild pain.   Yes [provider]  Ascorbic Acid (VITAMIN C) 1000 MG tablet Take 1,000 mg by mouth 2 (two) times a week.   Yes [provider]  chlorthalidone (HYGROTON) 25 MG tablet Take 1 tablet (25 mg total) by mouth daily. 10/03/22  Yes Mary Billings, NP  Cholecalciferol (VITAMIN D3) 1000 units CAPS Take 1,000 Units by mouth 3 (three) times a week.   Yes [provider]  clopidogrel (PLAVIX) 75 MG tablet Take 1 tablet (75 mg total) by mouth daily. 10/03/22  Yes Mary Billings, NP  Cyanocobalamin (VITAMIN B 12) 500 MCG TABS Take 500 mcg by mouth daily.   Yes [provider]  diclofenac Sodium (VOLTAREN) 1 % GEL Apply 2 g topically 4 (four) times daily. 01/25/22  Yes Mary Billings, NP  fluticasone furoate-vilanterol (BREO ELLIPTA) 100-25 MCG/INH AEPB Inhale 1 puff into the lungs daily. 07/12/21  Yes Mary Billings, NP  JARDIANCE 10 MG TABS tablet Take 1 tablet (10 mg total) by mouth daily. 10/27/22  Yes Mary Billings, NP  losartan (COZAAR) 100 MG tablet Take 1 tablet (100 mg total) by mouth daily. 10/05/22  Yes Mary Billings, NP  potassium chloride SA (KLOR-CON M) 20 MEQ tablet Take 1 tablet (20 mEq total) by mouth 2 (two) times daily. 10/03/22  Yes Mary Billings, NP  pyridOXINE (VITAMIN B-6) 25 MG tablet Take by mouth daily.    Yes [provider]  ACCU-CHEK GUIDE test strip Use to check blood sugar 3 times a day and document for visits. Goal is <130 fasting and <180 two hours after meal., 09/01/22   Mary Billings, NP  Accu-Chek Softclix Lancets lancets Use to check blood sugar 3 times a day and document for visits. Goal is <130 fasting and <180 two hours after meal., 09/01/22   Mary Billings, NP  Blood Glucose Monitoring Suppl (ACCU-CHEK AVIVA PLUS) w/Device KIT Use to check blood sugar 3 times a day and document for visits.  Goal is <130 fasting and <180 two hours after  meal. 07/15/21   Cannady, Jolene T, NP  gabapentin (NEURONTIN) 300 MG capsule Take 1 capsule (300 mg total) by mouth 3 (three) times daily. Patient not taking: Reported on 09/15/2022 08/23/22 09/22/22  Mary Billings, NP    Inpatient Medications: Scheduled Meds:  enoxaparin (LOVENOX) injection  40 mg Subcutaneous Q24H   insulin aspart  0-15 Units Subcutaneous Q4H   losartan  100 mg Oral Daily   mupirocin ointment  1 Application Nasal BID   Continuous Infusions:  0.9 % NaCl with KCl 20 mEq / L 100 mL/hr at 11/24/22 0445   cefTRIAXone (ROCEPHIN)  IV 2 g (11/24/22 0811)   PRN Meds: acetaminophen **OR** acetaminophen, diphenhydrAMINE, hydrALAZINE, HYDROmorphone (DILAUDID) injection, ondansetron **OR** ondansetron (ZOFRAN) IV, senna-docusate  Allergies:    Allergies  Allergen Reactions   Elemental Sulfur Anaphylaxis   Levaquin [Levofloxacin] Other (See Comments)    Stroke   Tekturna [Aliskiren] Shortness Of Breath   Accupril [Quinapril Hcl]    Aspirin    Diovan [Valsartan]    Erythromycin    Iodine Hives   Latex     Can't remember reaction    Lotensin [Benazepril]  Norvasc [Amlodipine]    Shellfish Allergy Swelling   Sulfa Antibiotics    Tetanus Toxoids    Tetracycline     Other reaction(s): Unknown   Tetracyclines & Related    Penicillins Rash    Social History:   Social History   Socioeconomic History   Marital status: Married    Spouse name: Not on file   Number of children: Not on file   Years of education: Not on file   Highest education level: Not on file  Occupational History   Not on file  Tobacco Use   Smoking status: Never   Smokeless tobacco: Never  Vaping Use   Vaping Use: Never used  Substance and Sexual Activity   Alcohol use: No   Drug use: No   Sexual activity: Not on file  Other Topics Concern   Not on file  Social History Narrative   Not on file   Social Determinants of Health   Financial Resource Strain: Low Risk  (01/24/2022)    Overall Financial Resource Strain (CARDIA)    Difficulty of Paying Living Expenses: Not hard at all  Food Insecurity: No Food Insecurity (01/24/2022)   Hunger Vital Sign    Worried About Running Out of Food in the Last Year: Never true    Ran Out of Food in the Last Year: Never true  Transportation Needs: No Transportation Needs (01/24/2022)   PRAPARE - Hydrologist (Medical): No    Lack of Transportation (Non-Medical): No  Physical Activity: Inactive (01/24/2022)   Exercise Vital Sign    Days of Exercise per Week: 0 days    Minutes of Exercise per Session: 0 min  Stress: No Stress Concern Present (01/24/2022)   [place]    Feeling of Stress : Not at all  Social Connections: Moderately Isolated (01/24/2022)   Social Connection and Isolation Panel [NHANES]    Frequency of Communication with Friends and Family: More than three times a week    Frequency of Social Gatherings with Friends and Family: Once a week    Attends Religious Services: Never    Marine scientist or Organizations: No    Attends Archivist Meetings: Never    Marital Status: Married  Human resources officer Violence: Not At Risk (01/24/2022)   Humiliation, Afraid, Rape, and Kick questionnaire    Fear of Current or Ex-Partner: No    Emotionally Abused: No    Physically Abused: No    Sexually Abused: No    Family History:    Family History  Problem Relation Age of Onset   Hypertension Mother    Hypertension Father    Diabetes Father        under control   Hypertension Maternal Grandmother    Depression Maternal Grandmother    Hypertension Maternal Grandfather    Hypertension Paternal Grandmother    Hypertension Paternal Grandfather    Thyroid disease Maternal Aunt      ROS:  Please see the history of present illness.   All other ROS reviewed and negative.     Physical Exam/Data:   Vitals:   11/24/22  0538 11/24/22 0744 11/24/22 1134  BP: (!) 141/57 (!) 143/90 (!) 153/76  Pulse: 71 73 71  Resp: '18 18 19  '$ Temp: 97.9 F (36.6 C) 98.2 F (36.8 C) 98 F (36.7 C)  TempSrc: Oral Oral Oral  SpO2: 97% 96%     Intake/Output Summary (  Last 24 hours) at 11/24/2022 1258 Last data filed at 11/24/2022 0600 Gross per 24 hour  Intake 122.48 ml  Output --  Net 122.48 ml      11/23/2022    9:41 AM 01/21/2022    1:07 PM  Last 3 Weights  Weight (lbs) 340 lb 356 lb 3.2 oz  Weight (kg) 154.223 kg 161.571 kg     There is no height or weight on file to calculate BMI.  General:  Pleasant, obese female. Laying flat in the bed with head slightly elevated. Wearing [employer]. In no acute distress  HEENT: normal Neck: no JVD with head elevated  Vascular: Radial pulses 2+ bilaterally Cardiac:  normal S1, S2; RRR; grade 2/6 systolic murmur at RUSB Lungs: Diminished breath sounds throughout  Abd: soft, mildly tender to palpation in the epigastric region  Ext: Trace edema in BLE, L>R  Musculoskeletal:  No deformities, BUE and BLE strength normal and equal Skin: warm and dry  Neuro:  CNs 2-12 intact, no focal abnormalities noted Psych:  Normal affect   EKG:  The EKG was personally reviewed and demonstrates:  ventricular paced rhythm, HR 83 BPM   Telemetry:  Telemetry was personally reviewed and demonstrates:  Patient not on telemetry   Relevant CV Studies:  Echocardiogram 03/11/22  1. Left ventricular ejection fraction, by estimation, is 50 to 55%. The  left ventricle has low normal function. The left ventricle has no regional  wall motion abnormalities. Left ventricular diastolic parameters are  consistent with Grade I diastolic  dysfunction (impaired relaxation).   2. Right ventricular systolic function is normal. The right ventricular  size is normal.   3. The mitral valve is normal in structure. Trivial mitral valve  regurgitation. No evidence of mitral stenosis.   4. The aortic valve is normal in  structure. Aortic valve regurgitation is  not visualized. No aortic stenosis is present.   5. The inferior vena cava is normal in size with greater than 50%  respiratory variability, suggesting right atrial pressure of 3 mmHg.   Laboratory Data:  High Sensitivity Troponin:   Recent Labs  Lab 11/23/22 1131  TROPONINIHS 14     Chemistry Recent Labs  Lab 11/23/22 0944 11/24/22 0433  NA 137 138  K 3.3* 3.2*  CL 98 97*  CO2 27 28  GLUCOSE 177* 112*  BUN 16 10  CREATININE 1.43* 1.15*  CALCIUM 9.4 8.8*  MG  --  2.5*  GFRNONAA 40* 52*  ANIONGAP 12 13    Recent Labs  Lab 11/23/22 0944 11/24/22 0433  PROT 9.5* 7.5  ALBUMIN 4.2 3.2*  AST 218* 213*  ALT 273* 234*  ALKPHOS 166* 125  BILITOT 1.6* 1.5*   Lipids No results for input(s): "CHOL", "TRIG", "HDL", "LABVLDL", "LDLCALC", "CHOLHDL" in the last 168 hours.  Hematology Recent Labs  Lab 11/23/22 0944 11/24/22 0433  WBC 8.8 12.7*  RBC 5.14* 4.68  HGB 15.6* 14.5  HCT 48.6* 43.8  MCV 94.6 93.6  MCH 30.4 31.0  MCHC 32.1 33.1  RDW 13.8 13.8  PLT 320 306   Thyroid No results for input(s): "TSH", "FREET4" in the last 168 hours.  BNPNo results for input(s): "BNP", "PROBNP" in the last 168 hours.  DDimer No results for input(s): "DDIMER" in the last 168 hours.   Radiology/Studies:  CT ABDOMEN PELVIS W CONTRAST  Result Date: 11/23/2022 CLINICAL DATA:  Abdominal pain EXAM: CT ABDOMEN AND PELVIS WITH CONTRAST TECHNIQUE: Multidetector CT imaging of the abdomen and pelvis was  performed using the standard protocol following bolus administration of intravenous contrast. RADIATION DOSE REDUCTION: This exam was performed according to the departmental dose-optimization program which includes automated exposure control, adjustment of the mA and/or kV according to patient size and/or use of iterative reconstruction technique. CONTRAST:  68m OMNIPAQUE IOHEXOL 300 MG/ML  SOLN COMPARISON:  Noncontrast CT done on 07/13/2014 FINDINGS:  Lower chest: There are small linear densities in lower lung fields suggesting scarring or subsegmental atelectasis. Pacemaker leads are noted in place. Hepatobiliary: No focal abnormalities are seen in liver. There is mild stranding adjacent to the gallbladder. Gallbladder is distended. There is no dilation of bile ducts. Pancreas: No focal abnormalities are seen. Spleen: Unremarkable. Adrenals/Urinary Tract: Adrenals are unremarkable. There is no hydronephrosis. There is 2.9 cm smooth marginated fluid density lesion in the anterior midportion of left kidney suggesting renal cyst. No follow-up imaging is recommended. There are no renal or ureteral stones. Urinary bladder is unremarkable. Stomach/Bowel: Stomach is moderately distended with fluid. There is no significant wall thickening in stomach. Small bowel loops are not dilated. Appendix is not dilated. There is no significant wall thickening in colon. There is no pericolic stranding. Vascular/Lymphatic: Scattered arterial calcifications are seen. Reproductive: Uterus is not seen. There is 6.3 x 5.2 cm smooth marginated fluid density lesion in the right adnexa. This lesion has increased in size. There are no definite internal septations. Other: There is no ascites or pneumoperitoneum. There is large umbilical hernia containing fat. Musculoskeletal: Degenerative changes are noted in lower thoracic spine and lumbar spine. Degenerative changes are noted in right hip. No acute findings are noted. IMPRESSION: There is mild pericholecystic stranding. Please correlate with clinical symptoms and laboratory findings to evaluate for acute cholecystitis. Follow-up gallbladder sonogram may be considered. There is no evidence of intestinal obstruction or pneumoperitoneum. There is no hydronephrosis. Appendix is not dilated. There is 6.3 cm smooth marginated fluid density lesion in the right adnexa which has increased in size since 07/13/2014. In view of patient's age,  follow-up pelvic sonogram may be considered for further characterization. Small linear densities in the lower lung fields may suggest minimal scarring or subsegmental atelectasis. There is 2.9 cm smooth marginated fluid density lesion in the midportion of left kidney suggesting possible simple cysts. Other findings as described in the body of the report. Other findings as described in the body of the report. Electronically Signed   By: PElmer PickerM.D.   On: 11/23/2022 18:38   UKoreaAbdomen Limited RUQ (LIVER/GB)  Result Date: 11/23/2022 CLINICAL DATA:  Abdominal pain over the last day. EXAM: ULTRASOUND ABDOMEN LIMITED RIGHT UPPER QUADRANT COMPARISON:  CT 07/13/2014 FINDINGS: Gallbladder: Sludge and small mobile stones in the gallbladder. No Murphy sign. Largest stone measures 10 mm. No wall thickening or adjacent fluid. Common bile duct: Diameter: 4.9 mm, within normal limits. Liver: Mildly heterogeneous echo pattern suggesting steatosis. No focal lesion evident. No ductal dilatation. Portal vein is patent on color Doppler imaging with normal direction of blood flow towards the liver. Other: None. IMPRESSION: 1. Sludge and small mobile stones in the gallbladder. No wall thickening or adjacent fluid. No Murphy sign. 2. Mild hepatic steatosis. Electronically Signed   By: MNelson ChimesM.D.   On: 11/23/2022 10:37     Assessment and Plan:   Preoperative evaluation  - Patient has a history of SSS, has had a ppm since 2015. Denies other cardiac history  - Denies chest pain, sob. Her physical activity is somewhat limited due to weight,  knee pain. However, she is able to move around her home, cook for herself, and do house chores without chest pain , sob.  - According to the Revised Cardiac Risk Index, patient is a class III risk (2 points, one for history of CVA and one for intraperitoneal surgery). This puts patient at a 10.1% risk of death, mi, or cardiac arrest - Patient also has BMI of 62 and history  of OSA, further increasing patient's operative risk - As patient denies chest pain, is able to do chores around her home without chest pain/sob, and as lower extremity edema is stable, I do not anticipate further cardiology work-up prior to surgery. MD to see   SSS s/p PPM  - Patient has a medtronic PPM that was implanted in 2015 - Patient was scheduled for dual chamber generator changeout on 1/11. Seen by her cardiologist yesterday while in the ED at [facility]-- they agreed that treatment for abdominal pain precludes non-urgent generator changeout. Patient has 2-3 months of battery life left    Risk Assessment/Risk Scores:    For questions or updates, please contact [place] Please consult [email or web address] for contact info under    Signed, Margie Billet, PA-C  11/24/2022 12:58 PM

## 2022-11-24 NOTE — H&P (Signed)
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  Patient presented to [facility] with complaints of abdominal pain.  Patient states she thought was a virus.  The pain was at the top of her stomach.  She is unable to sleep as a result.  She had nausea and vomiting that was green.  The emesis did not relieve her abdominal pain.  She denies fevers or chills.  Denies diarrhea but endorses constipation.  Her pain was continuous, she describes electrocardioverted stomach.  She states was heavy and crampy, at its worst 10/10, currently 0/10. Patient had similar symptoms approximately 3 weeks prior, at that time it lasted 3 days.  She had nausea and emesis.  Her emesis was clear-colored at this time.  Her abdominal discomfort ceased after emesis.  This time emesis did not resolve the pain.   Of note the patient has been off her Plavix for the past 5 days.  She is scheduled for change out of her PPM  At [place] patient's LFTs were elevated, these are concerning for biliary/gallbladder cause of her pain.  Patient unable to get MRI as she has a PPM in place.    CT scan abdomen pancreatic protocol for evaluation of biliary duct done.  No abnormalities was noted in the biliary ducts.  Patient was seen by surgeon Cintron-Diaz.  Patient is high risk for gallbladder removal given her weight, BMI 62.  She needed to be at tertiary center.  He states that he could do a cholecystectomy and the intraoperative cholangiogram.  If that is positive she will need a ERCP.  is not equipped to do endoscopic ultrasound to rule out choledocholithiasis.  Transfer was requested.  Review of Systems:  The patient denies anorexia, fever, weight loss,, vision loss, decreased hearing, hoarseness, chest pain, syncope, dyspnea on exertion, peripheral edema, balance  deficits, hemoptysis, abdominal pain, melena, hematochezia, severe indigestion/heartburn, hematuria, incontinence, genital sores, muscle weakness, suspicious skin lesions, transient blindness, difficulty walking, depression, unusual weight change, abnormal bleeding, enlarged lymph nodes, angioedema, and breast masses. Positive: Abdominal pain, nausea, vomiting, constipation  Past Medical History: Past Medical History:  Diagnosis Date   Arthritis    CAD (coronary artery disease)    CKD (chronic kidney disease)    CVA (cerebral infarction)    L sided   Dysrhythmia    Hyperlipidemia    Lymphadenopathy    Pituitary adenoma (HCC)    Presence of permanent cardiac pacemaker    Recurrent depression (HCC)    Shortness of breath dyspnea    Sick sinus syndrome ([place])    Stroke [facility])    Past Surgical History:  Procedure Laterality Date   ABDOMINAL HYSTERECTOMY     COLONOSCOPY WITH PROPOFOL N/A 01/05/2016   Procedure: COLONOSCOPY WITH PROPOFOL;  Surgeon: Lucilla Lame, MD;  Location: [facility] ENDOSCOPY;  Service: Endoscopy;  Laterality: N/A;   PACEMAKER INSERTION  2015   Dr. Josefa Half    Medications: Prior to Admission medications   Medication Sig Start Date End Date Taking? Authorizing Provider  ACCU-CHEK GUIDE test strip Use to check blood sugar 3 times a day and document for visits. Goal is <130 fasting and <180 two hours after meal., 09/01/22   [name], NP  Accu-Chek Softclix Lancets lancets Use to check blood sugar 3 times a day and document for visits. Goal  is <130 fasting and <180 two hours after meal., 09/01/22   [name], NP  Ascorbic Acid (VITAMIN C) 1000 MG tablet Take by mouth. Every other day    [provider]  Blood Glucose Monitoring Suppl (ACCU-CHEK AVIVA PLUS) w/Device KIT Use to check blood sugar 3 times a day and document for visits.  Goal is <130 fasting and <180 two hours after meal. 07/15/21   Cannady, Jolene T, NP  chlorthalidone (HYGROTON) 25 MG  tablet Take 1 tablet (25 mg total) by mouth daily. 10/03/22   [name], NP  Cholecalciferol (VITAMIN D3) 1000 units CAPS Take by mouth. Taking once a week    [provider]  clopidogrel (PLAVIX) 75 MG tablet Take 1 tablet (75 mg total) by mouth daily. 10/03/22   [name], NP  Cyanocobalamin (VITAMIN B 12) 500 MCG TABS Take by mouth daily.    [provider]  diclofenac Sodium (VOLTAREN) 1 % GEL Apply 2 g topically 4 (four) times daily. 01/25/22   [name], NP  fluticasone furoate-vilanterol (BREO ELLIPTA) 100-25 MCG/INH AEPB Inhale 1 puff into the lungs daily. 07/12/21   [name], NP  gabapentin (NEURONTIN) 300 MG capsule Take 1 capsule (300 mg total) by mouth 3 (three) times daily. Patient not taking: Reported on 09/15/2022 08/23/22 09/22/22  [name], NP  JARDIANCE 10 MG TABS tablet Take 1 tablet (10 mg total) by mouth daily. 10/27/22   [name], NP  losartan (COZAAR) 100 MG tablet Take 1 tablet (100 mg total) by mouth daily. 10/05/22   [name], NP  potassium chloride SA (KLOR-CON M) 20 MEQ tablet Take 1 tablet (20 mEq total) by mouth 2 (two) times daily. 10/03/22   [name], NP  pyridOXINE (VITAMIN B-6) 25 MG tablet Take by mouth daily.     [provider]    Allergies:   Allergies  Allergen Reactions   Elemental Sulfur Anaphylaxis   Levaquin [Levofloxacin] Other (See Comments)    Stroke   Tekturna [Aliskiren] Shortness Of Breath   Accupril [Quinapril Hcl]    Aspirin    Diovan [Valsartan]    Erythromycin    Iodine Hives   Latex     Can't remember reaction    Lotensin [Benazepril]    Norvasc [Amlodipine]    Shellfish Allergy Swelling   Sulfa Antibiotics    Tetanus Toxoids    Tetracycline     Other reaction(s): Unknown   Tetracyclines & Related    Penicillins Rash    Social History:  reports that she has never smoked. She has never used smokeless tobacco. She reports that she  does not drink alcohol and does not use drugs.  Family History: Family History  Problem Relation Age of Onset   Hypertension Mother    Hypertension Father    Diabetes Father        under control   Hypertension Maternal Grandmother    Depression Maternal Grandmother    Hypertension Maternal Grandfather    Hypertension Paternal Grandmother    Hypertension Paternal Grandfather    Thyroid disease Maternal Aunt     Physical Exam:   General:  Alert and oriented times three, extreme extreme morbid obesity Eyes: PERRLA, pink conjunctiva, no scleral icterus ENT: Moist oral mucosa, neck supple, no thyromegaly Lungs: clear to ascultation, no wheeze, no crackles, no use of accessory muscles Cardiovascular: regular rate and rhythm, no regurgitation, no gallops, no murmurs. No carotid bruits, no JVD Abdomen: soft, positive BS, positive TTP mostly  in the epigastric region, non-distended, no organomegaly,  GU: not examined Neuro: CN II - XII grossly intact, sensation intact Musculoskeletal: strength 5/5 all extremities, no clubbing, cyanosis.  2+ bilateral lower extremity edema Skin: no rash, no subcutaneous crepitation, no decubitus Psych: appropriate patient.  Labs on Admission:  Recent Labs    11/23/22 0944  NA 137  K 3.3*  CL 98  CO2 27  GLUCOSE 177*  BUN 16  CREATININE 1.43*  CALCIUM 9.4   Recent Labs    11/23/22 0944  AST 218*  ALT 273*  ALKPHOS 166*  BILITOT 1.6*  PROT 9.5*  ALBUMIN 4.2   Recent Labs    11/23/22 0944  LIPASE 30   Recent Labs    11/23/22 0944  WBC 8.8  HGB 15.6*  HCT 48.6*  MCV 94.6  PLT 320    Micro Results: Recent Results (from the past 240 hour(s))  Resp panel by RT-PCR (RSV, Flu A&B, Covid) Anterior Nasal Swab     Status: None   Collection Time: 11/23/22 11:31 AM   Specimen: Anterior Nasal Swab  Result Value Ref Range Status   SARS Coronavirus 2 by RT PCR NEGATIVE NEGATIVE Final    Comment: (NOTE) SARS-CoV-2 target nucleic acids  are NOT DETECTED.  The SARS-CoV-2 RNA is generally detectable in upper respiratory specimens during the acute phase of infection. The lowest concentration of SARS-CoV-2 viral copies this assay can detect is 138 copies/mL. A negative result does not preclude SARS-Cov-2 infection and should not be used as the sole basis for treatment or other patient management decisions. A negative result may occur with  improper specimen collection/handling, submission of specimen other than nasopharyngeal swab, presence of viral mutation(s) within the areas targeted by this assay, and inadequate number of viral copies(<138 copies/mL). A negative result must be combined with clinical observations, patient history, and epidemiological information. The expected result is Negative.  Fact Sheet for Patients:  EntrepreneurPulse.com.au  Fact Sheet for Healthcare Providers:  IncredibleEmployment.be  This test is no t yet approved or cleared by the [place] FDA and  has been authorized for detection and/or diagnosis of SARS-CoV-2 by FDA under an Emergency Use Authorization (EUA). This EUA will remain  in effect (meaning this test can be used) for the duration of the COVID-19 declaration under Section 564(b)(1) of the Act, 21 U.S.C.section 360bbb-3(b)(1), unless the authorization is terminated  or revoked sooner.       Influenza A by PCR NEGATIVE NEGATIVE Final   Influenza B by PCR NEGATIVE NEGATIVE Final    Comment: (NOTE) The Xpert Xpress SARS-CoV-2/FLU/RSV plus assay is intended as an aid in the diagnosis of influenza from Nasopharyngeal swab specimens and should not be used as a sole basis for treatment. Nasal washings and aspirates are unacceptable for Xpert Xpress SARS-CoV-2/FLU/RSV testing.  Fact Sheet for Patients: EntrepreneurPulse.com.au  Fact Sheet for Healthcare Providers: IncredibleEmployment.be  This test is  not yet approved or cleared by the [place] FDA and has been authorized for detection and/or diagnosis of SARS-CoV-2 by FDA under an Emergency Use Authorization (EUA). This EUA will remain in effect (meaning this test can be used) for the duration of the COVID-19 declaration under Section 564(b)(1) of the Act, 21 U.S.C. section 360bbb-3(b)(1), unless the authorization is terminated or revoked.     Resp Syncytial Virus by PCR NEGATIVE NEGATIVE Final    Comment: (NOTE) Fact Sheet for Patients: EntrepreneurPulse.com.au  Fact Sheet for Healthcare Providers: IncredibleEmployment.be  This test is not yet approved  or cleared by the [place] and has been authorized for detection and/or diagnosis of SARS-CoV-2 by FDA under an Emergency Use Authorization (EUA). This EUA will remain in effect (meaning this test can be used) for the duration of the COVID-19 declaration under Section 564(b)(1) of the Act, 21 U.S.C. section 360bbb-3(b)(1), unless the authorization is terminated or revoked.  Performed at [facility], [address] [place], [place] [address]      Radiological Exams on Admission: CT ABDOMEN PELVIS W CONTRAST  Result Date: 11/23/2022 CLINICAL DATA:  Abdominal pain EXAM: CT ABDOMEN AND PELVIS WITH CONTRAST TECHNIQUE: Multidetector CT imaging of the abdomen and pelvis was performed using the standard protocol following bolus administration of intravenous contrast. RADIATION DOSE REDUCTION: This exam was performed according to the departmental dose-optimization program which includes automated exposure control, adjustment of the mA and/or kV according to patient size and/or use of iterative reconstruction technique. CONTRAST:  40m OMNIPAQUE IOHEXOL 300 MG/ML  SOLN COMPARISON:  Noncontrast CT done on 07/13/2014 FINDINGS: Lower chest: There are small linear densities in lower lung fields suggesting scarring or subsegmental  atelectasis. Pacemaker leads are noted in place. Hepatobiliary: No focal abnormalities are seen in liver. There is mild stranding adjacent to the gallbladder. Gallbladder is distended. There is no dilation of bile ducts. Pancreas: No focal abnormalities are seen. Spleen: Unremarkable. Adrenals/Urinary Tract: Adrenals are unremarkable. There is no hydronephrosis. There is 2.9 cm smooth marginated fluid density lesion in the anterior midportion of left kidney suggesting renal cyst. No follow-up imaging is recommended. There are no renal or ureteral stones. Urinary bladder is unremarkable. Stomach/Bowel: Stomach is moderately distended with fluid. There is no significant wall thickening in stomach. Small bowel loops are not dilated. Appendix is not dilated. There is no significant wall thickening in colon. There is no pericolic stranding. Vascular/Lymphatic: Scattered arterial calcifications are seen. Reproductive: Uterus is not seen. There is 6.3 x 5.2 cm smooth marginated fluid density lesion in the right adnexa. This lesion has increased in size. There are no definite internal septations. Other: There is no ascites or pneumoperitoneum. There is large umbilical hernia containing fat. Musculoskeletal: Degenerative changes are noted in lower thoracic spine and lumbar spine. Degenerative changes are noted in right hip. No acute findings are noted. IMPRESSION: There is mild pericholecystic stranding. Please correlate with clinical symptoms and laboratory findings to evaluate for acute cholecystitis. Follow-up gallbladder sonogram may be considered. There is no evidence of intestinal obstruction or pneumoperitoneum. There is no hydronephrosis. Appendix is not dilated. There is 6.3 cm smooth marginated fluid density lesion in the right adnexa which has increased in size since 07/13/2014. In view of patient's age, follow-up pelvic sonogram may be considered for further characterization. Small linear densities in the lower  lung fields may suggest minimal scarring or subsegmental atelectasis. There is 2.9 cm smooth marginated fluid density lesion in the midportion of left kidney suggesting possible simple cysts. Other findings as described in the body of the report. Other findings as described in the body of the report. Electronically Signed   By: PElmer PickerM.D.   On: 11/23/2022 18:38   [place]domen Limited RUQ (LIVER/GB)  Result Date: 11/23/2022 CLINICAL DATA:  Abdominal pain over the last day. EXAM: ULTRASOUND ABDOMEN LIMITED RIGHT UPPER QUADRANT COMPARISON:  CT 07/13/2014 FINDINGS: Gallbladder: Sludge and small mobile stones in the gallbladder. No Murphy sign. Largest stone measures 10 mm. No wall thickening or adjacent fluid. Common bile duct: Diameter: 4.9 mm, within normal limits.  Liver: Mildly heterogeneous echo pattern suggesting steatosis. No focal lesion evident. No ductal dilatation. Portal vein is patent on color Doppler imaging with normal direction of blood flow towards the liver. Other: None. IMPRESSION: 1. Sludge and small mobile stones in the gallbladder. No wall thickening or adjacent fluid. No Murphy sign. 2. Mild hepatic steatosis. Electronically Signed   By: Nelson Chimes M.D.   On: 11/23/2022 10:37    Assessment/Plan Present on Admission:  Elevated LFTs/ Abdominal pain/ Nausea and vomiting -Admit to MedSurg -N.p.o., IV fluid hydration -Surgeon Dr. Zenia Resides aware -PRN Pain medications -Pain meds PRN -CMP, CBC with differential ordered   Hyperlipidemia -Statin held   Hypertension -Stable, Cozaar resumed  CKD stage III ([place]) -Stable, at baseline   Morbid obesity ([place]) -The patient is CPAP   Pacemaker/ Sick sinus syndrome ([place]) -Aware   Torren Maffeo 11/24/2022, 2:14 AM

## 2022-11-24 NOTE — Progress Notes (Signed)
Scheduling will allow for surgery tomorrow 1/12.  Will allow CLD today and NPO p MN tonight.  Discussed with patient and husband who was in the room.  Agree with Rocephin.  Follow LFTs.  DC purewick and mobilize the patient.  Henreitta Cea 8:58 AM 11/24/2022

## 2022-11-25 DIAGNOSIS — I495 Sick sinus syndrome: Secondary | ICD-10-CM

## 2022-11-25 DIAGNOSIS — R7989 Other specified abnormal findings of blood chemistry: Secondary | ICD-10-CM | POA: Diagnosis not present

## 2022-11-25 DIAGNOSIS — Z950 Presence of cardiac pacemaker: Secondary | ICD-10-CM

## 2022-11-25 LAB — GLUCOSE, CAPILLARY
Glucose-Capillary: 100 mg/dL — ABNORMAL HIGH (ref 70–99)
Glucose-Capillary: 106 mg/dL — ABNORMAL HIGH (ref 70–99)
Glucose-Capillary: 109 mg/dL — ABNORMAL HIGH (ref 70–99)
Glucose-Capillary: 114 mg/dL — ABNORMAL HIGH (ref 70–99)
Glucose-Capillary: 126 mg/dL — ABNORMAL HIGH (ref 70–99)
Glucose-Capillary: 128 mg/dL — ABNORMAL HIGH (ref 70–99)
Glucose-Capillary: 134 mg/dL — ABNORMAL HIGH (ref 70–99)
Glucose-Capillary: 137 mg/dL — ABNORMAL HIGH (ref 70–99)
Glucose-Capillary: 144 mg/dL — ABNORMAL HIGH (ref 70–99)

## 2022-11-25 LAB — CBC WITH DIFFERENTIAL/PLATELET
Abs Immature Granulocytes: 0.04 10*3/uL (ref 0.00–0.07)
Basophils Absolute: 0 10*3/uL (ref 0.0–0.1)
Basophils Relative: 1 %
Eosinophils Absolute: 0 10*3/uL (ref 0.0–0.5)
Eosinophils Relative: 1 %
HCT: 45.7 % (ref 36.0–46.0)
Hemoglobin: 14.5 g/dL (ref 12.0–15.0)
Immature Granulocytes: 1 %
Lymphocytes Relative: 11 %
Lymphs Abs: 0.9 10*3/uL (ref 0.7–4.0)
MCH: 30.8 pg (ref 26.0–34.0)
MCHC: 31.7 g/dL (ref 30.0–36.0)
MCV: 97 fL (ref 80.0–100.0)
Monocytes Absolute: 1.2 10*3/uL — ABNORMAL HIGH (ref 0.1–1.0)
Monocytes Relative: 14 %
Neutro Abs: 6 10*3/uL (ref 1.7–7.7)
Neutrophils Relative %: 72 %
Platelets: 255 10*3/uL (ref 150–400)
RBC: 4.71 MIL/uL (ref 3.87–5.11)
RDW: 14.1 % (ref 11.5–15.5)
WBC: 8.2 10*3/uL (ref 4.0–10.5)
nRBC: 0 % (ref 0.0–0.2)

## 2022-11-25 LAB — COMPREHENSIVE METABOLIC PANEL
ALT: 348 U/L — ABNORMAL HIGH (ref 0–44)
AST: 250 U/L — ABNORMAL HIGH (ref 15–41)
Albumin: 3.2 g/dL — ABNORMAL LOW (ref 3.5–5.0)
Alkaline Phosphatase: 143 U/L — ABNORMAL HIGH (ref 38–126)
Anion gap: 11 (ref 5–15)
BUN: 9 mg/dL (ref 8–23)
CO2: 26 mmol/L (ref 22–32)
Calcium: 8.6 mg/dL — ABNORMAL LOW (ref 8.9–10.3)
Chloride: 99 mmol/L (ref 98–111)
Creatinine, Ser: 1.23 mg/dL — ABNORMAL HIGH (ref 0.44–1.00)
GFR, Estimated: 48 mL/min — ABNORMAL LOW (ref >60)
Glucose, Bld: 132 mg/dL — ABNORMAL HIGH (ref 70–99)
Potassium: 3.5 mmol/L (ref 3.5–5.1)
Sodium: 136 mmol/L (ref 135–145)
Total Bilirubin: 5.1 mg/dL — ABNORMAL HIGH (ref 0.3–1.2)
Total Protein: 7.6 g/dL (ref 6.5–8.1)

## 2022-11-25 LAB — MAGNESIUM: Magnesium: 2.2 mg/dL (ref 1.7–2.4)

## 2022-11-25 LAB — HIV ANTIBODY (ROUTINE TESTING W REFLEX): HIV Screen 4th Generation wRfx: NONREACTIVE

## 2022-11-25 MED ORDER — ENOXAPARIN SODIUM 40 MG/0.4ML IJ SOSY
40.0000 mg | PREFILLED_SYRINGE | Freq: Once | INTRAMUSCULAR | Status: AC
  Administered 2022-11-25: 40 mg via SUBCUTANEOUS
  Filled 2022-11-25: qty 0.4

## 2022-11-25 MED ORDER — ENOXAPARIN SODIUM 40 MG/0.4ML IJ SOSY: 40.0000 mg | PREFILLED_SYRINGE | INTRAMUSCULAR | Status: DC

## 2022-11-25 MED ORDER — ENOXAPARIN SODIUM 40 MG/0.4ML IJ SOSY
40.0000 mg | PREFILLED_SYRINGE | INTRAMUSCULAR | Status: DC
  Administered 2022-11-27 – 2022-11-28 (×2): 40 mg via SUBCUTANEOUS
  Filled 2022-11-25 (×2): qty 0.4

## 2022-11-25 NOTE — Progress Notes (Addendum)
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  On presentation, she was found to have elevated LFTs.  CT of abdomen and pelvis with contrast showed mild pericholecystic stranding, right upper quadrant ultrasound showed sludge and small mobile stones in the gallbladder.  MRCP could not be done because of patient having pacemaker.  She was transferred to [facility] for further general surgical evaluation.  She was started on antibiotics.  General surgery and subsequently cardiology were consulted.  Assessment & Plan:   Possible acute calculus cholecystitis Elevated LFTs -Continue IV Rocephin.  General surgery was planning for surgical intervention today but LFTs including bilirubin have worsened.  I have consulted GI as per general surgery recommendations.  Follow GI recommendations. -Monitor LFTs.  Keep n.p.o. for now.  Continue pain management.  History of sick sinus syndrome status post pacemaker -Cardiology following.  Outpatient follow-up with EP: Dual-chamber generator change out will have to be rescheduled as an outpatient.  Leukocytosis -Resolved  Hypokalemia -Resolved  Chronic kidney disease stage IIIa -Creatinine stable.  Monitor  Diabetes mellitus type 2 -continue CBGs with SSI  Hypertension --monitor blood pressure.  Continue losartan   Morbid obesity -Outpatient follow-up  Hyperlipidemia -Statin on hold    DVT prophylaxis: SCDs Code Status: Full Family Communication: Husband, mother and sisters at bedside Disposition Plan: Status is: Inpatient Remains inpatient appropriate because: Of severity of illness.    Consultants: General surgery/cardiology/GI  Procedures:  None  Antimicrobials: Rocephin   Subjective: Patient seen and examined at bedside.  Abdominal pain is improving.  Denies any current fever, vomiting.  Denies chest pain or shortness of breath.  Objective: Vitals:   11/24/22 1632 11/24/22 2044 11/25/22 0537 11/25/22 0750  BP: (!) 163/72 137/71 (!) 143/124 (!) 122/58  Pulse: 70 74 81 66  Resp: '20  18 18  '$ Temp: 98.2 F (36.8 C) 97.6 F (36.4 C) 97.6 F (36.4 C) 98.2 F (36.8 C)  TempSrc: Oral Oral Oral Oral  SpO2: 97% 97% 100% 99%    Intake/Output Summary (Last 24 hours) at 11/25/2022 0929 Last data filed at 11/25/2022 0857 Gross per 24 hour  Intake 120 ml  Output 1050 ml  Net -930 ml   There were no vitals filed for this visit.  Examination:  General exam: Appears calm and comfortable.  On room air. Respiratory system: Bilateral decreased breath sounds at bases with scattered crackles Cardiovascular system: S1 & S2 heard, Rate controlled Gastrointestinal system: Abdomen is distended, soft and mildly tender in the right upper quadrant.  Normal bowel sounds heard. Extremities: No cyanosis, clubbing; trace lower extremity edema present Central nervous system: Alert and oriented. No focal neurological deficits. Moving extremities Skin: No rashes, lesions or ulcers Psychiatry: Judgement and insight appear normal. Mood & affect appropriate.     Data Reviewed: I have personally reviewed following labs and imaging studies  CBC: Recent Labs  Lab 11/23/22 0944 11/24/22 0433 11/25/22 0355  WBC 8.8 12.7* 8.2  NEUTROABS  --  10.1* 6.0  HGB 15.6* 14.5 14.5  HCT 48.6* 43.8 45.7  MCV 94.6 93.6 97.0  PLT 320 306 092   Basic Metabolic Panel: Recent Labs  Lab 11/23/22 0944 11/24/22 0433 11/25/22 0355  NA 137 138 136  K 3.3* 3.2* 3.5  CL 98 97* 99  CO2 '27 28 26  '$ GLUCOSE 177* 112* 132*  BUN '16 10 9  '$ CREATININE 1.43* 1.15* 1.23*  CALCIUM 9.4 8.8* 8.6*  MG  --  2.5* 2.2   GFR: Estimated Creatinine Clearance: 64.3  mL/min (A) (by C-G formula based on SCr of 1.23 mg/dL (H)). Liver Function Tests: Recent Labs  Lab 11/23/22 0944 11/24/22 0433 11/25/22 0355  AST 218* 213* 250*  ALT 273* 234* 348*  ALKPHOS 166* 125 143*  BILITOT 1.6* 1.5* 5.1*  PROT 9.5* 7.5 7.6  ALBUMIN 4.2 3.2* 3.2*   Recent Labs  Lab 11/23/22 0944  LIPASE 30   No results for input(s): "AMMONIA" in the last 168 hours. Coagulation Profile: No results for input(s): "INR", "PROTIME" in the last 168 hours. Cardiac Enzymes: No results for input(s): "CKTOTAL", "CKMB", "CKMBINDEX", "TROPONINI" in the last 168 hours. BNP (last 3 results) No results for input(s): "PROBNP" in the last 8760 hours. HbA1C: No results for input(s): "HGBA1C" in the last 72 hours. CBG: Recent Labs  Lab 11/24/22 2342 11/25/22 0017 11/25/22 0358 11/25/22 0532 11/25/22 0749  GLUCAP 138* 137* 144* 128* 126*   Lipid Profile: No results for input(s): "CHOL", "HDL", "LDLCALC", "TRIG", "CHOLHDL", "LDLDIRECT" in the last 72 hours. Thyroid Function Tests: No results for input(s): "TSH", "T4TOTAL", "FREET4", "T3FREE", "THYROIDAB" in the last 72 hours. Anemia Panel: No results for input(s): "VITAMINB12", "FOLATE", "FERRITIN", "TIBC", "IRON", "RETICCTPCT" in the last 72 hours. Sepsis Labs: No results for input(s): "PROCALCITON", "LATICACIDVEN" in the last 168 hours.  Recent Results (from the past 240 hour(s))  Resp panel by RT-PCR (RSV, Flu A&B, Covid) Anterior Nasal Swab     Status: None   Collection Time: 11/23/22 11:31 AM   Specimen: Anterior Nasal Swab  Result Value Ref Range Status   SARS Coronavirus 2 by RT PCR NEGATIVE NEGATIVE Final    Comment: (NOTE) SARS-CoV-2 target nucleic acids are NOT DETECTED.  The SARS-CoV-2 RNA is generally detectable in upper respiratory specimens during the acute phase of infection. The lowest concentration of SARS-CoV-2 viral copies this assay can detect is 138 copies/mL. A negative result does not preclude  SARS-Cov-2 infection and should not be used as the sole basis for treatment or other patient management decisions. A negative result may occur with  improper specimen collection/handling, submission of specimen other than nasopharyngeal swab, presence of viral mutation(s) within the areas targeted by this assay, and inadequate number of viral copies(<138 copies/mL). A negative result must be combined with clinical observations, patient history, and epidemiological information. The expected result is Negative.  Fact Sheet for Patients:  EntrepreneurPulse.com.au  Fact Sheet for Healthcare Providers:  IncredibleEmployment.be  This test is no t yet approved or cleared by the [place] FDA and  has been authorized for detection and/or diagnosis of SARS-CoV-2 by FDA under an Emergency Use Authorization (EUA). This EUA will remain  in effect (meaning this test can be used) for the duration of the COVID-19 declaration under Section 564(b)(1) of the Act, 21 U.S.C.section 360bbb-3(b)(1), unless the authorization is terminated  or revoked sooner.       Influenza A by PCR NEGATIVE NEGATIVE Final   Influenza B by PCR NEGATIVE NEGATIVE Final    Comment: (NOTE) The Xpert Xpress SARS-CoV-2/FLU/RSV plus assay is intended as an aid in the diagnosis of influenza from Nasopharyngeal swab specimens and should not be used as a sole basis for treatment. Nasal washings and aspirates are unacceptable for Xpert Xpress  SARS-CoV-2/FLU/RSV testing.  Fact Sheet for Patients: EntrepreneurPulse.com.au  Fact Sheet for Healthcare Providers: IncredibleEmployment.be  This test is not yet approved or cleared by the [place] FDA and has been authorized for detection and/or diagnosis of SARS-CoV-2 by FDA under an Emergency Use Authorization (EUA). This EUA will remain in effect (meaning this test can be used) for the duration of  the COVID-19 declaration under Section 564(b)(1) of the Act, 21 U.S.C. section 360bbb-3(b)(1), unless the authorization is terminated or revoked.     Resp Syncytial Virus by PCR NEGATIVE NEGATIVE Final    Comment: (NOTE) Fact Sheet for Patients: EntrepreneurPulse.com.au  Fact Sheet for Healthcare Providers: IncredibleEmployment.be  This test is not yet approved or cleared by the [place] FDA and has been authorized for detection and/or diagnosis of SARS-CoV-2 by FDA under an Emergency Use Authorization (EUA). This EUA will remain in effect (meaning this test can be used) for the duration of the COVID-19 declaration under Section 564(b)(1) of the Act, 21 U.S.C. section 360bbb-3(b)(1), unless the authorization is terminated or revoked.  Performed at [facility], [address], [place], [place] [address]   Surgical PCR screen     Status: None   Collection Time: 11/24/22  7:36 AM   Specimen: Nasal Mucosa; Nasal Swab  Result Value Ref Range Status   MRSA, PCR NEGATIVE NEGATIVE Final   Staphylococcus aureus NEGATIVE NEGATIVE Final    Comment: (NOTE) The Xpert SA Assay (FDA approved for NASAL specimens in patients 14 years of age and older), is one component of a comprehensive surveillance program. It is not intended to diagnose infection nor to guide or monitor treatment. Performed at [facility], [place] [address] [place], [place] [address]          Radiology Studies: CT ABDOMEN PELVIS W CONTRAST  Result Date: 11/23/2022 CLINICAL DATA:  Abdominal pain EXAM: CT ABDOMEN AND PELVIS WITH CONTRAST TECHNIQUE: Multidetector CT imaging of the abdomen and pelvis was performed using the standard protocol following bolus administration of intravenous contrast. RADIATION DOSE REDUCTION: This exam was performed according to the departmental dose-optimization program which includes automated exposure control, adjustment of the mA  and/or kV according to patient size and/or use of iterative reconstruction technique. CONTRAST:  66m OMNIPAQUE IOHEXOL 300 MG/ML  SOLN COMPARISON:  Noncontrast CT done on 07/13/2014 FINDINGS: Lower chest: There are small linear densities in lower lung fields suggesting scarring or subsegmental atelectasis. Pacemaker leads are noted in place. Hepatobiliary: No focal abnormalities are seen in liver. There is mild stranding adjacent to the gallbladder. Gallbladder is distended. There is no dilation of bile ducts. Pancreas: No focal abnormalities are seen. Spleen: Unremarkable. Adrenals/Urinary Tract: Adrenals are unremarkable. There is no hydronephrosis. There is 2.9 cm smooth marginated fluid density lesion in the anterior midportion of left kidney suggesting renal cyst. No follow-up imaging is recommended. There are no renal or ureteral stones. Urinary bladder is unremarkable. Stomach/Bowel: Stomach is moderately distended with fluid. There is no significant wall thickening in stomach. Small bowel loops are not dilated. Appendix is not dilated. There is no significant wall thickening in colon. There is no pericolic stranding. Vascular/Lymphatic: Scattered arterial calcifications are seen. Reproductive: Uterus is not seen. There is 6.3 x 5.2 cm smooth marginated fluid density lesion in the right adnexa. This lesion has increased in size. There are no definite internal septations. Other: There is no ascites or pneumoperitoneum. There is large umbilical hernia containing fat. Musculoskeletal: Degenerative changes are noted in lower thoracic spine  and lumbar spine. Degenerative changes are noted in right hip. No acute findings are noted. IMPRESSION: There is mild pericholecystic stranding. Please correlate with clinical symptoms and laboratory findings to evaluate for acute cholecystitis. Follow-up gallbladder sonogram may be considered. There is no evidence of intestinal obstruction or pneumoperitoneum. There is no  hydronephrosis. Appendix is not dilated. There is 6.3 cm smooth marginated fluid density lesion in the right adnexa which has increased in size since 07/13/2014. In view of patient's age, follow-up pelvic sonogram may be considered for further characterization. Small linear densities in the lower lung fields may suggest minimal scarring or subsegmental atelectasis. There is 2.9 cm smooth marginated fluid density lesion in the midportion of left kidney suggesting possible simple cysts. Other findings as described in the body of the report. Other findings as described in the body of the report. Electronically Signed   By: Elmer Picker M.D.   On: 11/23/2022 18:38   US Abdomen Limited RUQ (LIVER/GB)  Result Date: 11/23/2022 CLINICAL DATA:  Abdominal pain over the last day. EXAM: ULTRASOUND ABDOMEN LIMITED RIGHT UPPER QUADRANT COMPARISON:  CT 07/13/2014 FINDINGS: Gallbladder: Sludge and small mobile stones in the gallbladder. No Murphy sign. Largest stone measures 10 mm. No wall thickening or adjacent fluid. Common bile duct: Diameter: 4.9 mm, within normal limits. Liver: Mildly heterogeneous echo pattern suggesting steatosis. No focal lesion evident. No ductal dilatation. Portal vein is patent on color Doppler imaging with normal direction of blood flow towards the liver. Other: None. IMPRESSION: 1. Sludge and small mobile stones in the gallbladder. No wall thickening or adjacent fluid. No Murphy sign. 2. Mild hepatic steatosis. Electronically Signed   By: Nelson Chimes M.D.   On: 11/23/2022 10:37        Scheduled Meds:  enoxaparin (LOVENOX) injection  40 mg Subcutaneous Q24H   insulin aspart  0-15 Units Subcutaneous Q4H   losartan  100 mg Oral Daily   mupirocin ointment  1 Application Nasal BID   Continuous Infusions:  0.9 % NaCl with KCl 20 mEq / L 100 mL/hr at 11/25/22 0346   cefTRIAXone (ROCEPHIN)  IV 2 g (11/25/22 0820)          Aline August, MD [facility] 11/25/2022, 9:29  AM

## 2022-11-25 NOTE — Consult Note (Addendum)
Consultation  Referring Provider: Dr. Starla Link    Primary Care Physician:  [name], NP Primary Gastroenterologist: Althia Forts here- previously Dr. Allen Norris in [facility] Reason for Consultation:   Elevated bilirubin, cholecystitis         HPI:   Mary Webb is a 67 y.o. 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  She was found to have acute cholecystitis and we are consulted now in regards to elevated bilirubin overnight and concern for choledocholithiasis.    At time of admission patient described she thought she had a virus and developed pain in the top of her stomach but was unable to sleep, she had a lot of nausea and vomiting which did not relieve her abdominal pain which felt "heavy and crampy" at its worst a 10/10.  Apparently had similar symptoms about 3 weeks ago that lasted for 3 days with nausea and vomiting.  Initially seen at [facility] with elevated LFTs concerning for biliary/gallbladder cause of her pain, unable to get MRI with pacemaker in place, CT scan pancreatic protocol showed no abnormalities in the biliary duct.  She was transferred here given her BMI of 62 and need for surgery.    Today, patient explains that she felt absolutely horrible with a 10/10 epigastric pain and nausea and vomiting for about 3 to 4 days, this was not going away so she presented to the ER.  Along with this has not had a bowel movement for 5 days or passed any gas.  At this time she is passing gas this morning and feels better after the medications they have given her.  She is aware of the concerns for a "stone in my liver", and the need for an extra procedure.  Denies any other acute complaints or concerns but would like to eat today if nothing  is being done.    Denies fever, chills, weight loss, blood in her stool or symptoms that awaken her from sleep.  GI history: 01/05/2016 colonoscopy Dr. Allen Norris with nonbleeding internal hemorrhoids and otherwise normal.  Repeat recommended 10 years.  Past Medical History:  Diagnosis Date   Arthritis    CAD (coronary artery disease)    CKD (chronic kidney disease)    CVA (cerebral infarction)    L sided   Dysrhythmia    Hyperlipidemia    Lymphadenopathy    Nausea and vomiting 09/15/2022   Pituitary adenoma ([place])    Presence of permanent cardiac pacemaker    Recurrent depression (HCC)    Shortness of breath dyspnea    Sick sinus syndrome (HCC)    Stroke [facility])     Past Surgical History:  Procedure Laterality Date   ABDOMINAL HYSTERECTOMY     COLONOSCOPY WITH PROPOFOL N/A 01/05/2016   Procedure: COLONOSCOPY WITH PROPOFOL;  Surgeon: Lucilla Lame, MD;  Location: [facility] ENDOSCOPY;  Service: Endoscopy;  Laterality: N/A;   PACEMAKER INSERTION  2015   Dr. Josefa Half    Family History  Problem Relation Age of Onset   Hypertension Mother    Hypertension Father    Diabetes Father        under control   Hypertension Maternal Grandmother    Depression Maternal Grandmother    Hypertension Maternal Grandfather  Hypertension Paternal Grandmother    Hypertension Paternal Grandfather    Thyroid disease Maternal Aunt     Social History   Tobacco Use   Smoking status: Never   Smokeless tobacco: Never  Vaping Use   Vaping Use: Never used  Substance Use Topics   Alcohol use: No   Drug use: No    Prior to Admission medications   Medication Sig Start Date End Date Taking? Authorizing Provider  acetaminophen (TYLENOL) 500 MG tablet Take 1,000 mg by mouth every 6 (six) hours as needed for mild pain.   Yes [provider]  Ascorbic Acid (VITAMIN C) 1000 MG tablet Take 1,000 mg by mouth 2 (two) times a week.   Yes [provider]  chlorthalidone (HYGROTON) 25 MG tablet  Take 1 tablet (25 mg total) by mouth daily. 10/03/22  Yes [name], NP  Cholecalciferol (VITAMIN D3) 1000 units CAPS Take 1,000 Units by mouth 3 (three) times a week.   Yes [provider]  clopidogrel (PLAVIX) 75 MG tablet Take 1 tablet (75 mg total) by mouth daily. 10/03/22  Yes [name], NP  Cyanocobalamin (VITAMIN B 12) 500 MCG TABS Take 500 mcg by mouth daily.   Yes [provider]  diclofenac Sodium (VOLTAREN) 1 % GEL Apply 2 g topically 4 (four) times daily. 01/25/22  Yes [name], NP  fluticasone furoate-vilanterol (BREO ELLIPTA) 100-25 MCG/INH AEPB Inhale 1 puff into the lungs daily. 07/12/21  Yes [name], NP  JARDIANCE 10 MG TABS tablet Take 1 tablet (10 mg total) by mouth daily. 10/27/22  Yes [name], NP  losartan (COZAAR) 100 MG tablet Take 1 tablet (100 mg total) by mouth daily. 10/05/22  Yes [name], NP  potassium chloride SA (KLOR-CON M) 20 MEQ tablet Take 1 tablet (20 mEq total) by mouth 2 (two) times daily. 10/03/22  Yes [name], NP  pyridOXINE (VITAMIN B-6) 25 MG tablet Take by mouth daily.    Yes [provider]  ACCU-CHEK GUIDE test strip Use to check blood sugar 3 times a day and document for visits. Goal is <130 fasting and <180 two hours after meal., 09/01/22   [name], NP  Accu-Chek Softclix Lancets lancets Use to check blood sugar 3 times a day and document for visits. Goal is <130 fasting and <180 two hours after meal., 09/01/22   [name], NP  Blood Glucose Monitoring Suppl (ACCU-CHEK AVIVA PLUS) w/Device KIT Use to check blood sugar 3 times a day and document for visits.  Goal is <130 fasting and <180 two hours after meal. 07/15/21   Cannady, Jolene T, NP  gabapentin (NEURONTIN) 300 MG capsule Take 1 capsule (300 mg total) by mouth 3 (three) times daily. Patient not taking: Reported on 09/15/2022 08/23/22 09/22/22  [name], NP    Current  Facility-Administered Medications  Medication Dose Route Frequency Provider Last Rate Last Admin   0.9 % NaCl with KCl 20 mEq/ L  infusion   Intravenous Continuous Claria Dice, Debby, MD 100 mL/hr at 11/25/22 0346 New Bag at 11/25/22 0346   acetaminophen (TYLENOL) tablet 650 mg  650 mg Oral Q6H PRN Quintella Baton, MD       Or   acetaminophen (TYLENOL) suppository 650 mg  650 mg Rectal Q6H PRN Crosley, Debby, MD       cefTRIAXone (ROCEPHIN) 2 g in sodium chloride 0.9 % 100 mL IVPB  2 g Intravenous Q24H Starla Link, Kshitiz, MD 200 mL/hr at 11/25/22 0820 2 g at  11/25/22 0820   diphenhydrAMINE (BENADRYL) capsule 25 mg  25 mg Oral Q4H PRN Quintella Baton, MD   25 mg at 11/24/22 1942   enoxaparin (LOVENOX) injection 40 mg  40 mg Subcutaneous Q24H Crosley, Debby, MD   40 mg at 11/24/22 0805   hydrALAZINE (APRESOLINE) injection 5 mg  5 mg Intravenous Q4H PRN Quintella Baton, MD       HYDROmorphone (DILAUDID) injection 1 mg  1 mg Intravenous Q3H PRN Claria Dice, Debby, MD   1 mg at 11/24/22 2337   insulin aspart (novoLOG) injection 0-15 Units  0-15 Units Subcutaneous Q4H Quintella Baton, MD   2 Units at 11/25/22 0819   losartan (COZAAR) tablet 100 mg  100 mg Oral Daily Claria Dice, Debby, MD   100 mg at 11/24/22 0805   mupirocin ointment (BACTROBAN) 2 % 1 Application  1 Application Nasal BID Aline August, MD   1 Application at 99/83/38 2052   ondansetron (ZOFRAN) tablet 4 mg  4 mg Oral Q6H PRN Quintella Baton, MD       Or   ondansetron (ZOFRAN) injection 4 mg  4 mg Intravenous Q6H PRN Crosley, Debby, MD       senna-docusate (Senokot-S) tablet 1 tablet  1 tablet Oral QHS PRN Quintella Baton, MD        Allergies as of 11/23/2022 - Review Complete 11/23/2022  Allergen Reaction Noted   Elemental sulfur Anaphylaxis 07/13/2015   Levaquin [levofloxacin] Other (See Comments) 03/13/2015   Tekturna [aliskiren] Shortness Of Breath 03/13/2015   Accupril [quinapril hcl]  03/13/2015   Aspirin  03/13/2015   Diovan [valsartan]   03/13/2015   Erythromycin  03/13/2015   Iodine Hives 03/13/2015   Latex  11/25/2015   Lotensin [benazepril]  03/13/2015   Norvasc [amlodipine]  03/13/2015   Shellfish allergy Swelling 11/25/2015   Sulfa antibiotics  03/13/2015   Tetanus toxoids  03/13/2015   Tetracycline  07/13/2015   Tetracyclines & related  03/13/2015   Penicillins Rash 03/13/2015     Review of Systems:    Constitutional: No weight loss, fever or chills Skin: No rash  Cardiovascular: No chest pain  Respiratory: No SOB Gastrointestinal: See HPI and otherwise negative Genitourinary: No dysuria  Neurological: No headache, dizziness or syncope Musculoskeletal: No new muscle or joint pain Hematologic: No bleeding  Psychiatric: No history of depression or anxiety    Physical Exam:  Vital signs in last 24 hours: Temp:  [97.6 F (36.4 C)-98.2 F (36.8 C)] 98.2 F (36.8 C) (01/12 0750) Pulse Rate:  [66-81] 66 (01/12 0750) Resp:  [18-20] 18 (01/12 0750) BP: (122-163)/(58-124) 122/58 (01/12 0750) SpO2:  [97 %-100 %] 99 % (01/12 0750) Last BM Date : 11/23/22 General:   Pleasant morbidly obese AA female appears to be in NAD, Well developed, Well nourished, alert and cooperative Head:  Normocephalic and atraumatic. Eyes:   PEERL, EOMI. No icterus. Conjunctiva pink. Ears:  Normal auditory acuity. Neck:  Supple Throat: Oral cavity and pharynx without inflammation, swelling or lesion.  Lungs: Respirations even and unlabored. Lungs clear to auscultation bilaterally.   No wheezes, crackles, or rhonchi.  Heart: Normal S1, S2. No MRG. Regular rate and rhythm. No peripheral edema, cyanosis or pallor.  Abdomen:  Soft, nondistended, nontender. No rebound or guarding. Normal bowel sounds. No appreciable masses or hepatomegaly. Rectal:  Not performed.  Msk:  Symmetrical without gross deformities. Peripheral pulses intact.  Extremities:  Without edema, no deformity or joint abnormality.  Neurologic:  Alert and  oriented x4;  grossly normal neurologically.  Skin:   Dry and intact without significant lesions or rashes. Psychiatric: Demonstrates good judgement and reason without abnormal affect or behaviors.   LAB RESULTS: Recent Labs    11/23/22 0944 11/24/22 0433 11/25/22 0355  WBC 8.8 12.7* 8.2  HGB 15.6* 14.5 14.5  HCT 48.6* 43.8 45.7  PLT 320 306 255   BMET Recent Labs    11/23/22 0944 11/24/22 0433 11/25/22 0355  NA 137 138 136  K 3.3* 3.2* 3.5  CL 98 97* 99  CO2 '27 28 26  '$ GLUCOSE 177* 112* 132*  BUN '16 10 9  '$ CREATININE 1.43* 1.15* 1.23*  CALCIUM 9.4 8.8* 8.6*      Latest Ref Rng & Units 11/25/2022    3:55 AM 11/24/2022    4:33 AM 11/23/2022    9:44 AM  Hepatic Function  Total Protein 6.5 - 8.1 g/dL 7.6  7.5  9.5   Albumin 3.5 - 5.0 g/dL 3.2  3.2  4.2   AST 15 - 41 U/L 250  213  218   ALT 0 - 44 U/L 348  234  273   Alk Phosphatase 38 - 126 U/L 143  125  166   Total Bilirubin 0.3 - 1.2 mg/dL 5.1  1.5  1.6      STUDIES: CT ABDOMEN PELVIS W CONTRAST  Result Date: 11/23/2022 CLINICAL DATA:  Abdominal pain EXAM: CT ABDOMEN AND PELVIS WITH CONTRAST TECHNIQUE: Multidetector CT imaging of the abdomen and pelvis was performed using the standard protocol following bolus administration of intravenous contrast. RADIATION DOSE REDUCTION: This exam was performed according to the departmental dose-optimization program which includes automated exposure control, adjustment of the mA and/or kV according to patient size and/or use of iterative reconstruction technique. CONTRAST:  71m OMNIPAQUE IOHEXOL 300 MG/ML  SOLN COMPARISON:  Noncontrast CT done on 07/13/2014 FINDINGS: Lower chest: There are small linear densities in lower lung fields suggesting scarring or subsegmental atelectasis. Pacemaker leads are noted in place. Hepatobiliary: No focal abnormalities are seen in liver. There is mild stranding adjacent to the gallbladder. Gallbladder is distended. There is no dilation of bile ducts. Pancreas: No  focal abnormalities are seen. Spleen: Unremarkable. Adrenals/Urinary Tract: Adrenals are unremarkable. There is no hydronephrosis. There is 2.9 cm smooth marginated fluid density lesion in the anterior midportion of left kidney suggesting renal cyst. No follow-up imaging is recommended. There are no renal or ureteral stones. Urinary bladder is unremarkable. Stomach/Bowel: Stomach is moderately distended with fluid. There is no significant wall thickening in stomach. Small bowel loops are not dilated. Appendix is not dilated. There is no significant wall thickening in colon. There is no pericolic stranding. Vascular/Lymphatic: Scattered arterial calcifications are seen. Reproductive: Uterus is not seen. There is 6.3 x 5.2 cm smooth marginated fluid density lesion in the right adnexa. This lesion has increased in size. There are no definite internal septations. Other: There is no ascites or pneumoperitoneum. There is large umbilical hernia containing fat. Musculoskeletal: Degenerative changes are noted in lower thoracic spine and lumbar spine. Degenerative changes are noted in right hip. No acute findings are noted. IMPRESSION: There is mild pericholecystic stranding. Please correlate with clinical symptoms and laboratory findings to evaluate for acute cholecystitis. Follow-up gallbladder sonogram may be considered. There is no evidence of intestinal obstruction or pneumoperitoneum. There is no hydronephrosis. Appendix is not dilated. There is 6.3 cm smooth marginated fluid density lesion in the right adnexa which has increased in size since 07/13/2014. In view of patient's  age, follow-up pelvic sonogram may be considered for further characterization. Small linear densities in the lower lung fields may suggest minimal scarring or subsegmental atelectasis. There is 2.9 cm smooth marginated fluid density lesion in the midportion of left kidney suggesting possible simple cysts. Other findings as described in the body of  the report. Other findings as described in the body of the report. Electronically Signed   By: Elmer Picker M.D.   On: 11/23/2022 18:38   US Abdomen Limited RUQ (LIVER/GB)  Result Date: 11/23/2022 CLINICAL DATA:  Abdominal pain over the last day. EXAM: ULTRASOUND ABDOMEN LIMITED RIGHT UPPER QUADRANT COMPARISON:  CT 07/13/2014 FINDINGS: Gallbladder: Sludge and small mobile stones in the gallbladder. No Murphy sign. Largest stone measures 10 mm. No wall thickening or adjacent fluid. Common bile duct: Diameter: 4.9 mm, within normal limits. Liver: Mildly heterogeneous echo pattern suggesting steatosis. No focal lesion evident. No ductal dilatation. Portal vein is patent on color Doppler imaging with normal direction of blood flow towards the liver. Other: None. IMPRESSION: 1. Sludge and small mobile stones in the gallbladder. No wall thickening or adjacent fluid. No Murphy sign. 2. Mild hepatic steatosis. Electronically Signed   By: Nelson Chimes M.D.   On: 11/23/2022 10:37      Impression / Plan:   Impression: 1.  Elevated LFTs/abdominal pain/nausea and vomiting: CT with mild pericholecystic stranding, LFTs increasing overnight with a T. bili 1.5--> 5.1 as well as other elevations, unable to get MRCP due to pacemaker; concern for choledocholithiasis 2.  CKD stage III 3.  Morbid obesity 4.  Status post pacemaker for sick sinus syndrome: Apparently due for an exchange and has been off of Plavix for the past 6 days  Plan: 1.  Plan for ERCP tomorrow with Dr. Lyndel Safe.  Did discuss risks, benefits, limitations and alternatives with the patient and she agrees to proceed. 2.  Continue to monitor LFTs 3.  Continue supportive measures 4.  If patient is not going for surgery today she can have clear liquid diet from a GI standpoint n.p.o. at midnight for ERCP tomorrow.  Will communicate with general surgery in regards to diet. 5. Hold Lovenox tomorrow for ERCP  Thank you for your kind consultation, we  will continue to follow.  Lavone Nian [facility]  11/25/2022, 9:39 AM    Attending physician's note   I have taken history, reviewed the chart and examined the patient. I performed a substantive portion of this encounter, including complete performance of at least one of the key components, in conjunction with the APP. I agree with the Advanced Practitioner's note, impression and recommendations.   Suspected choledocholithiasis with Obs jaundice (TB 5) and abn LFTs.  Cannot obtain MRCP d/t pacemaker for SSS Acute cholecystitis on Rocephin.  CAD/CVA on Plavix (last dose 1/6) Morbid obesity  Plan: -ERCP in AM with Dr. Benson Norway.  I explained risks and benefits including risks of pancreatitis, bleeding, perforation.  A picture of biliary anatomy over the phone was shown to her.  She understands.  She has been cleared by cardiology to undergo GA. -Hold Lovenox tonight. -Continue Rocephin -Check CBC, LFTs, INR and lipase in AM.  Dr. Benson Norway taking over the service tomorrow.  He is aware.   Carmell Austria, MD [facility] [phone]

## 2022-11-25 NOTE — Progress Notes (Signed)
Subjective: Patient feels ok today.  No new complaints.  Still with some RUQ abdominal pain and in her epigastrium  ROS: See above, otherwise other systems negative  Objective: Vital signs in last 24 hours: Temp:  [97.6 F (36.4 C)-98.2 F (36.8 C)] 98.2 F (36.8 C) (01/12 0750) Pulse Rate:  [66-81] 66 (01/12 0750) Resp:  [18-20] 18 (01/12 0750) BP: (122-163)/(58-124) 122/58 (01/12 0750) SpO2:  [97 %-100 %] 99 % (01/12 0750) Last BM Date : 11/23/22  Intake/Output from previous day: 01/11 0701 - 01/12 0700 In: 120 [P.O.:120] Out: 1050 [Urine:1050] Intake/Output this shift: No intake/output data recorded.  PE: Abd: soft, morbidly obese, umbilical hernia soft, mildly tender in RUQ and epigastrium  Lab Results:  Recent Labs    11/24/22 0433 11/25/22 0355  WBC 12.7* 8.2  HGB 14.5 14.5  HCT 43.8 45.7  PLT 306 255   BMET Recent Labs    11/24/22 0433 11/25/22 0355  NA 138 136  K 3.2* 3.5  CL 97* 99  CO2 28 26  GLUCOSE 112* 132*  BUN 10 9  CREATININE 1.15* 1.23*  CALCIUM 8.8* 8.6*   PT/INR No results for input(s): "LABPROT", "INR" in the last 72 hours. CMP     Component Value Date/Time   NA 136 11/25/2022 0355   NA 139 07/25/2022 1006   NA 137 07/12/2014 2227   K 3.5 11/25/2022 0355   K 3.4 (L) 07/12/2014 2227   CL 99 11/25/2022 0355   CL 99 07/12/2014 2227   CO2 26 11/25/2022 0355   CO2 28 07/12/2014 2227   GLUCOSE 132 (H) 11/25/2022 0355   GLUCOSE 102 (H) 07/12/2014 2227   BUN 9 11/25/2022 0355   BUN 15 07/25/2022 1006   BUN 15 07/12/2014 2227   CREATININE 1.23 (H) 11/25/2022 0355   CREATININE 1.37 (H) 07/12/2014 2227   CALCIUM 8.6 (L) 11/25/2022 0355   CALCIUM 9.0 07/12/2014 2227   PROT 7.6 11/25/2022 0355   PROT 7.6 07/25/2022 1006   PROT 8.4 (H) 07/12/2014 2227   ALBUMIN 3.2 (L) 11/25/2022 0355   ALBUMIN 4.0 07/25/2022 1006   ALBUMIN 3.4 07/12/2014 2227   AST 250 (H) 11/25/2022 0355   AST 45 (H) 05/23/2017 0933   AST 28  07/12/2014 2227   ALT 348 (H) 11/25/2022 0355   ALT 23 05/23/2017 0933   ALT 38 07/12/2014 2227   ALKPHOS 143 (H) 11/25/2022 0355   ALKPHOS 78 07/12/2014 2227   BILITOT 5.1 (H) 11/25/2022 0355   BILITOT 0.8 07/25/2022 1006   BILITOT 0.3 07/12/2014 2227   GFRNONAA 48 (L) 11/25/2022 0355   GFRNONAA 42 (L) 07/12/2014 2227   GFRAA 59 (L) 12/29/2020 1101   GFRAA 49 (L) 07/12/2014 2227   Lipase     Component Value Date/Time   LIPASE 30 11/23/2022 0944   LIPASE 374 07/12/2014 2227       Studies/Results: CT ABDOMEN PELVIS W CONTRAST  Result Date: 11/23/2022 CLINICAL DATA:  Abdominal pain EXAM: CT ABDOMEN AND PELVIS WITH CONTRAST TECHNIQUE: Multidetector CT imaging of the abdomen and pelvis was performed using the standard protocol following bolus administration of intravenous contrast. RADIATION DOSE REDUCTION: This exam was performed according to the departmental dose-optimization program which includes automated exposure control, adjustment of the mA and/or kV according to patient size and/or use of iterative reconstruction technique. CONTRAST:  50m OMNIPAQUE IOHEXOL 300 MG/ML  SOLN COMPARISON:  Noncontrast CT done on 07/13/2014 FINDINGS: Lower chest: There are small  linear densities in lower lung fields suggesting scarring or subsegmental atelectasis. Pacemaker leads are noted in place. Hepatobiliary: No focal abnormalities are seen in liver. There is mild stranding adjacent to the gallbladder. Gallbladder is distended. There is no dilation of bile ducts. Pancreas: No focal abnormalities are seen. Spleen: Unremarkable. Adrenals/Urinary Tract: Adrenals are unremarkable. There is no hydronephrosis. There is 2.9 cm smooth marginated fluid density lesion in the anterior midportion of left kidney suggesting renal cyst. No follow-up imaging is recommended. There are no renal or ureteral stones. Urinary bladder is unremarkable. Stomach/Bowel: Stomach is moderately distended with fluid. There is no  significant wall thickening in stomach. Small bowel loops are not dilated. Appendix is not dilated. There is no significant wall thickening in colon. There is no pericolic stranding. Vascular/Lymphatic: Scattered arterial calcifications are seen. Reproductive: Uterus is not seen. There is 6.3 x 5.2 cm smooth marginated fluid density lesion in the right adnexa. This lesion has increased in size. There are no definite internal septations. Other: There is no ascites or pneumoperitoneum. There is large umbilical hernia containing fat. Musculoskeletal: Degenerative changes are noted in lower thoracic spine and lumbar spine. Degenerative changes are noted in right hip. No acute findings are noted. IMPRESSION: There is mild pericholecystic stranding. Please correlate with clinical symptoms and laboratory findings to evaluate for acute cholecystitis. Follow-up gallbladder sonogram may be considered. There is no evidence of intestinal obstruction or pneumoperitoneum. There is no hydronephrosis. Appendix is not dilated. There is 6.3 cm smooth marginated fluid density lesion in the right adnexa which has increased in size since 07/13/2014. In view of patient's age, follow-up pelvic sonogram may be considered for further characterization. Small linear densities in the lower lung fields may suggest minimal scarring or subsegmental atelectasis. There is 2.9 cm smooth marginated fluid density lesion in the midportion of left kidney suggesting possible simple cysts. Other findings as described in the body of the report. Other findings as described in the body of the report. Electronically Signed   By: Elmer Picker M.D.   On: 11/23/2022 18:38    Anti-infectives: Anti-infectives (From admission, onward)    Start     Dose/Rate Route Frequency Ordered Stop   11/24/22 0800  cefTRIAXone (ROCEPHIN) 2 g in sodium chloride 0.9 % 100 mL IVPB        2 g 200 mL/hr over 30 Minutes Intravenous Every 24 hours 11/24/22 0743           Assessment/Plan Cholecystitis with elevated TB/LFTs, possible choledocholithiasis -will hold on OR today -GI consult for TB up to 5.1 today -likely plan for ERCP tomorrow and OR to follow the following day pending no complications -d/w family and patient and drew a picture for understanding. -may have CLD today and NPO for GI tomorrow  -cards noted reviewed from yesterday and cleared for OR  FEN - CLD VTE - Lovenox ID - Rocephin  I reviewed Consultant cardiology, GI notes, hospitalist notes, last 24 h vitals and pain scores, last 48 h intake and output, last 24 h labs and trends, and last 24 h imaging results.   LOS: 1 day    Henreitta Cea , [facility] 11/25/2022, 11:25 AM Please see Amion for pager number during day hours 7:00am-4:30pm or 7:00am -11:30am on weekends

## 2022-11-25 NOTE — Plan of Care (Signed)

## 2022-11-26 ENCOUNTER — Encounter (HOSPITAL_COMMUNITY)
Admission: AD | Disposition: A | Discharge status: Home Health | Payer: Self-pay | Source: Home / Self Care | Attending: Internal Medicine

## 2022-11-26 ENCOUNTER — Inpatient Hospital Stay (HOSPITAL_COMMUNITY): Payer: Medicare Other | Admitting: Anesthesiology

## 2022-11-26 ENCOUNTER — Inpatient Hospital Stay (HOSPITAL_COMMUNITY): Payer: Medicare Other

## 2022-11-26 ENCOUNTER — Encounter (HOSPITAL_COMMUNITY): Payer: Self-pay | Admitting: Family Medicine

## 2022-11-26 DIAGNOSIS — I495 Sick sinus syndrome: Secondary | ICD-10-CM | POA: Diagnosis not present

## 2022-11-26 DIAGNOSIS — I10 Essential (primary) hypertension: Secondary | ICD-10-CM | POA: Diagnosis not present

## 2022-11-26 DIAGNOSIS — R7989 Other specified abnormal findings of blood chemistry: Secondary | ICD-10-CM | POA: Diagnosis not present

## 2022-11-26 DIAGNOSIS — K8020 Calculus of gallbladder without cholecystitis without obstruction: Secondary | ICD-10-CM | POA: Diagnosis not present

## 2022-11-26 DIAGNOSIS — K831 Obstruction of bile duct: Secondary | ICD-10-CM | POA: Diagnosis not present

## 2022-11-26 DIAGNOSIS — Z950 Presence of cardiac pacemaker: Secondary | ICD-10-CM | POA: Diagnosis not present

## 2022-11-26 DIAGNOSIS — I2510 Atherosclerotic heart disease of native coronary artery without angina pectoris: Secondary | ICD-10-CM

## 2022-11-26 HISTORY — PX: ESOPHAGOGASTRODUODENOSCOPY (EGD) WITH PROPOFOL: SHX5813

## 2022-11-26 HISTORY — PX: EUS: SHX5427

## 2022-11-26 LAB — CBC WITH DIFFERENTIAL/PLATELET
Abs Immature Granulocytes: 0.02 10*3/uL (ref 0.00–0.07)
Basophils Absolute: 0 10*3/uL (ref 0.0–0.1)
Basophils Relative: 1 %
Eosinophils Absolute: 0.2 10*3/uL (ref 0.0–0.5)
Eosinophils Relative: 5 %
HCT: 38.1 % (ref 36.0–46.0)
Hemoglobin: 12.4 g/dL (ref 12.0–15.0)
Immature Granulocytes: 0 %
Lymphocytes Relative: 22 %
Lymphs Abs: 1.2 10*3/uL (ref 0.7–4.0)
MCH: 30.9 pg (ref 26.0–34.0)
MCHC: 32.5 g/dL (ref 30.0–36.0)
MCV: 95 fL (ref 80.0–100.0)
Monocytes Absolute: 0.7 10*3/uL (ref 0.1–1.0)
Monocytes Relative: 13 %
Neutro Abs: 3.2 10*3/uL (ref 1.7–7.7)
Neutrophils Relative %: 59 %
Platelets: 234 10*3/uL (ref 150–400)
RBC: 4.01 MIL/uL (ref 3.87–5.11)
RDW: 14.3 % (ref 11.5–15.5)
WBC: 5.4 10*3/uL (ref 4.0–10.5)
nRBC: 0 % (ref 0.0–0.2)

## 2022-11-26 LAB — COMPREHENSIVE METABOLIC PANEL
ALT: 242 U/L — ABNORMAL HIGH (ref 0–44)
AST: 144 U/L — ABNORMAL HIGH (ref 15–41)
Albumin: 2.7 g/dL — ABNORMAL LOW (ref 3.5–5.0)
Alkaline Phosphatase: 124 U/L (ref 38–126)
Anion gap: 8 (ref 5–15)
BUN: 9 mg/dL (ref 8–23)
CO2: 27 mmol/L (ref 22–32)
Calcium: 8.3 mg/dL — ABNORMAL LOW (ref 8.9–10.3)
Chloride: 101 mmol/L (ref 98–111)
Creatinine, Ser: 1.1 mg/dL — ABNORMAL HIGH (ref 0.44–1.00)
GFR, Estimated: 55 mL/min — ABNORMAL LOW (ref >60)
Glucose, Bld: 103 mg/dL — ABNORMAL HIGH (ref 70–99)
Potassium: 3.1 mmol/L — ABNORMAL LOW (ref 3.5–5.1)
Sodium: 136 mmol/L (ref 135–145)
Total Bilirubin: 4.9 mg/dL — ABNORMAL HIGH (ref 0.3–1.2)
Total Protein: 6.5 g/dL (ref 6.5–8.1)

## 2022-11-26 LAB — GLUCOSE, CAPILLARY
Glucose-Capillary: 108 mg/dL — ABNORMAL HIGH (ref 70–99)
Glucose-Capillary: 141 mg/dL — ABNORMAL HIGH (ref 70–99)
Glucose-Capillary: 144 mg/dL — ABNORMAL HIGH (ref 70–99)
Glucose-Capillary: 170 mg/dL — ABNORMAL HIGH (ref 70–99)
Glucose-Capillary: 185 mg/dL — ABNORMAL HIGH (ref 70–99)

## 2022-11-26 LAB — PROTIME-INR
INR: 1.1 (ref 0.8–1.2)
Prothrombin Time: 14.3 seconds (ref 11.4–15.2)

## 2022-11-26 LAB — MAGNESIUM: Magnesium: 2.3 mg/dL (ref 1.7–2.4)

## 2022-11-26 LAB — LIPASE, BLOOD: Lipase: 21 U/L (ref 11–51)

## 2022-11-26 SURGERY — UPPER ENDOSCOPIC ULTRASOUND (EUS) LINEAR
Anesthesia: General

## 2022-11-26 MED ORDER — FENTANYL CITRATE (PF) 250 MCG/5ML IJ SOLN
INTRAMUSCULAR | Status: DC | PRN
  Administered 2022-11-26: 50 ug via INTRAVENOUS

## 2022-11-26 MED ORDER — LIDOCAINE 2% (20 MG/ML) 5 ML SYRINGE
INTRAMUSCULAR | Status: DC | PRN
  Administered 2022-11-26: 100 mg via INTRAVENOUS

## 2022-11-26 MED ORDER — GLUCAGON HCL RDNA (DIAGNOSTIC) 1 MG IJ SOLR
INTRAMUSCULAR | Status: AC
  Filled 2022-11-26: qty 1

## 2022-11-26 MED ORDER — ONDANSETRON HCL 4 MG/2ML IJ SOLN
INTRAMUSCULAR | Status: DC | PRN
  Administered 2022-11-26: 4 mg via INTRAVENOUS

## 2022-11-26 MED ORDER — PHENYLEPHRINE 80 MCG/ML (10ML) SYRINGE FOR IV PUSH (FOR BLOOD PRESSURE SUPPORT)
PREFILLED_SYRINGE | INTRAVENOUS | Status: DC | PRN
  Administered 2022-11-26: 240 ug via INTRAVENOUS

## 2022-11-26 MED ORDER — FENTANYL CITRATE (PF) 100 MCG/2ML IJ SOLN: 25.0000 ug | INTRAMUSCULAR | Status: DC | PRN

## 2022-11-26 MED ORDER — CIPROFLOXACIN IN D5W 400 MG/200ML IV SOLN
INTRAVENOUS | Status: AC
  Filled 2022-11-26: qty 200

## 2022-11-26 MED ORDER — SUCCINYLCHOLINE CHLORIDE 200 MG/10ML IV SOSY
PREFILLED_SYRINGE | INTRAVENOUS | Status: DC | PRN
  Administered 2022-11-26: 160 mg via INTRAVENOUS

## 2022-11-26 MED ORDER — PROPOFOL 10 MG/ML IV BOLUS
INTRAVENOUS | Status: DC | PRN
  Administered 2022-11-26: 150 mg via INTRAVENOUS

## 2022-11-26 MED ORDER — LACTATED RINGERS IV SOLN: INTRAVENOUS | Status: DC | PRN

## 2022-11-26 MED ORDER — FENTANYL CITRATE (PF) 100 MCG/2ML IJ SOLN
INTRAMUSCULAR | Status: AC
  Filled 2022-11-26: qty 2

## 2022-11-26 MED ORDER — SODIUM CHLORIDE 0.9 % IV SOLN: INTRAVENOUS | Status: DC

## 2022-11-26 MED ORDER — DEXAMETHASONE SODIUM PHOSPHATE 10 MG/ML IJ SOLN
INTRAMUSCULAR | Status: DC | PRN
  Administered 2022-11-26: 10 mg via INTRAVENOUS

## 2022-11-26 MED ORDER — DICLOFENAC SUPPOSITORY 100 MG: 100.0000 mg | Freq: Once | RECTAL | Status: DC

## 2022-11-26 MED ORDER — ONDANSETRON HCL 4 MG/2ML IJ SOLN: 4.0000 mg | Freq: Once | INTRAMUSCULAR | Status: DC | PRN

## 2022-11-26 MED ORDER — DICLOFENAC SUPPOSITORY 100 MG
RECTAL | Status: AC
  Filled 2022-11-26: qty 1

## 2022-11-26 NOTE — Transfer of Care (Signed)
Immediate Anesthesia Transfer of Care Note  Patient: Mary Webb  Procedure(s) Performed: UPPER ENDOSCOPIC ULTRASOUND (EUS) LINEAR  Patient Location: PACU  Anesthesia Type:General  Level of Consciousness: drowsy and patient cooperative  Airway & Oxygen Therapy: Patient Spontanous Breathing and Patient connected to nasal cannula oxygen  Post-op Assessment: Report given to RN and Post -op Vital signs reviewed and stable  Post vital signs: Reviewed and stable  Last Vitals:  Vitals Value Taken Time  BP 135/70 11/26/22 0824  Temp    Pulse 71 11/26/22 0826  Resp 18 11/26/22 0826  SpO2 93 % 11/26/22 0826  Vitals shown include unvalidated device data.  Last Pain:  Vitals:   11/26/22 0709  TempSrc: Temporal  PainSc: 0-No pain         Complications: No notable events documented.

## 2022-11-26 NOTE — Op Note (Signed)
[facility] Patient Name: Mary Webb Procedure Date : 11/26/2022 MRN: [id number] Attending MD: Carol Ada , MD, [id number] Date of Birth: Aug 18, 1956 CSN: [id number] Age: 67 Admit Type: Inpatient Procedure:                Upper EUS Indications:              Obstruction of bile duct on CT Providers:                Carol Ada, MD, Doristine Johns, RN, Ladona Ridgel, Technician Referring MD:             Arta Silence Medicines:                General Anesthesia Complications:            No immediate complications. Estimated Blood Loss:     Estimated blood loss: none. Procedure:                Pre-Anesthesia Assessment:                           - Prior to the procedure, a History and Physical                            was performed, and patient medications and                            allergies were reviewed. The patient's tolerance of                            previous anesthesia was also reviewed. The risks                            and benefits of the procedure and the sedation                            options and risks were discussed with the patient.                            All questions were answered, and informed consent                            was obtained. Prior Anticoagulants: The patient has                            taken no anticoagulant or antiplatelet agents. ASA                            Grade Assessment: III - A patient with severe                            systemic disease. After reviewing the risks and  benefits, the patient was deemed in satisfactory                            condition to undergo the procedure.                           - Sedation was administered by an anesthesia                            professional. Deep sedation was attained.                           After obtaining informed consent, the endoscope was                            passed under direct  vision. Throughout the                            procedure, the patient's blood pressure, pulse, and                            oxygen saturations were monitored continuously. The                            [id number] ([id number] Olympus linear ultrasound scope                            was introduced through the mouth, and advanced to                            the second part of duodenum. The upper EUS was                            accomplished without difficulty. The patient                            tolerated the procedure well. Scope In: Scope Out: Findings:      ENDOSONOGRAPHIC FINDING: :      Multiple stones were visualized endosonographically in the gallbladder       body. The stones measured up to 20 mm in greatest dimension. The stones       were round. They were hyperechoic and characterized by shadowing.      The CBD measured 4 mm throughout the entire length of the duct. Multiple       views and repeated evaluations were performed on the duct. There was no       evidence of any stones or sludge. The gallbladder contained multiple       stones. The largest stone measured 2 cm, but this may be a       conglomeration of stones. The gallbladder wall was thickened. No       evidence of any pericholecystic fluid. In the left lobe of the liver       steatosis was identified. All the other identified organs or normal. Impression:               -  Multiple stones were visualized                            endosonographically in the gallbladder body.                           - No specimens collected. Recommendation:           - Return patient to hospital ward for ongoing care.                           - Lap chole per Surgery. Procedure Code(s):        --- Professional ---                           [phone], Esophagogastroduodenoscopy, flexible,                            transoral; with endoscopic ultrasound examination                            limited to the esophagus, stomach or  duodenum, and                            adjacent structures Diagnosis Code(s):        --- Professional ---                           K80.20, Calculus of gallbladder without                            cholecystitis without obstruction                           K83.1, Obstruction of bile duct CPT copyright 2022 American Medical Association. All rights reserved. The codes documented in this report are preliminary and upon coder review may  be revised to meet current compliance requirements. Carol Ada, MD Carol Ada, MD 11/26/2022 8:23:30 AM This report has been signed electronically. Number of Addenda: 0

## 2022-11-26 NOTE — Anesthesia Preprocedure Evaluation (Signed)
Anesthesia Evaluation  Patient identified by MRN, date of birth, ID band Patient awake    Reviewed: Allergy & Precautions, NPO status , Patient's Chart, lab work & pertinent test results  Airway Mallampati: II  TM Distance: >3 FB Neck ROM: Full    Dental  (+) Teeth Intact, Dental Advisory Given   Pulmonary neg pulmonary ROS   Pulmonary exam normal breath sounds clear to auscultation       Cardiovascular hypertension, Pt. on medications + CAD  Normal cardiovascular exam+ dysrhythmias (SSS) + pacemaker  Rhythm:Regular Rate:Normal     Neuro/Psych  PSYCHIATRIC DISORDERS  Depression    Pituitary adenoma  CVA, Residual Symptoms    GI/Hepatic negative GI ROS, Neg liver ROS,,,Elevated LFT's, Epigastric abdominal pain, Nausea/Vomiting   Endo/Other  diabetes, Type 2, Oral Hypoglycemic Agents    Renal/GU Renal InsufficiencyRenal disease     Musculoskeletal negative musculoskeletal ROS (+)    Abdominal   Peds  Hematology  (+) Blood dyscrasia (Plavix)   Anesthesia Other Findings Day of surgery medications reviewed with the patient.  Reproductive/Obstetrics                             Anesthesia Physical Anesthesia Plan  ASA: 3  Anesthesia Plan: General   Post-op Pain Management: Minimal or no pain anticipated   Induction: Intravenous  PONV Risk Score and Plan: 3 and Treatment may vary due to age or medical condition, Dexamethasone and Ondansetron  Airway Management Planned: Oral ETT  Additional Equipment:   Intra-op Plan:   Post-operative Plan: Extubation in OR  Informed Consent: I have reviewed the patients History and Physical, chart, labs and discussed the procedure including the risks, benefits and alternatives for the proposed anesthesia with the patient or authorized representative who has indicated his/her understanding and acceptance.     Dental advisory given  Plan  Discussed with: CRNA  Anesthesia Plan Comments:        Anesthesia Quick Evaluation

## 2022-11-26 NOTE — Progress Notes (Signed)
Day of Surgery   Subjective/Chief Complaint: Patient just back from EUS Complains only of throat pain   Objective: Vital signs in last 24 hours: Temp:  [97.3 F (36.3 C)-99 F (37.2 C)] 98.2 F (36.8 C) (01/13 0924) Pulse Rate:  [59-81] 66 (01/13 0924) Resp:  [16-24] 16 (01/13 0924) BP: (107-149)/(44-79) 146/79 (01/13 0924) SpO2:  [90 %-98 %] 97 % (01/13 0924) Last BM Date : 11/23/22  Intake/Output from previous day: 01/12 0701 - 01/13 0700 In: 240 [P.O.:240] Out: -  Intake/Output this shift: Total I/O In: 300 [I.V.:300] Out: -   Exam: Awake and alert Abdomen morbidly obese, some mild RUQ tenderness  Lab Results:  Recent Labs    11/25/22 0355 11/26/22 0250  WBC 8.2 5.4  HGB 14.5 12.4  HCT 45.7 38.1  PLT 255 234   BMET Recent Labs    11/25/22 0355 11/26/22 0250  NA 136 136  K 3.5 3.1*  CL 99 101  CO2 26 27  GLUCOSE 132* 103*  BUN 9 9  CREATININE 1.23* 1.10*  CALCIUM 8.6* 8.3*   PT/INR Recent Labs    11/26/22 0250  LABPROT 14.3  INR 1.1   ABG No results for input(s): "PHART", "HCO3" in the last 72 hours.  Invalid input(s): "PCO2", "PO2"  Studies/Results: No results found.  Anti-infectives: Anti-infectives (From admission, onward)    Start     Dose/Rate Route Frequency Ordered Stop   11/24/22 0800  cefTRIAXone (ROCEPHIN) 2 g in sodium chloride 0.9 % 100 mL IVPB        2 g 200 mL/hr over 30 Minutes Intravenous Every 24 hours 11/24/22 0743         Assessment/Plan: Cholecystitis with gallstones.  EUS today shows gallstones but no evidence of CBD stone (4 mm bile duct)  I discussed the findings with the patient and her family. Recommend proceeding to the OR tomorrow for a lap chole and IOC We again discussed the reason for surgery I discussed the procedure in detail.   We discussed the risks and benefits of a laparoscopic cholecystectomy and  cholangiogram including, but not limited to bleeding, infection, injury to surrounding  structures such as the intestine or liver, bile leak, retained gallstones, need to convert to an open procedure, prolonged diarrhea, blood clots such as  DVT, common bile duct injury, anesthesia risks, and possible need for additional procedures.  Surgery will be by my partner, Dr. Rosendo Gros  They agree to proceed   Coralie Keens MD 11/26/2022

## 2022-11-26 NOTE — Progress Notes (Signed)
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  On presentation, she was found to have elevated LFTs.  CT of abdomen and pelvis with contrast showed mild pericholecystic stranding, right upper quadrant ultrasound showed sludge and small mobile stones in the gallbladder.  MRCP could not be done because of patient having pacemaker.  She was transferred to [facility] for further general surgical evaluation.  She was started on antibiotics.  General surgery and subsequently cardiology were consulted.  Assessment & Plan:   Possible acute calculus cholecystitis Elevated LFTs -Continue IV Rocephin.   -GI planning for ERCP today.  General surgery following as well.   -Monitor LFTs. Continue pain management.  History of sick sinus syndrome status post pacemaker -Cardiology following.  Outpatient follow-up with EP: Dual-chamber generator change out will have to be rescheduled as an outpatient.  Leukocytosis -Resolved  Hypokalemia -Replace.  Repeat a.m. labs  Chronic kidney disease stage IIIa -Creatinine stable.  Monitor  Diabetes mellitus type 2 -continue CBGs with SSI  Hypertension --monitor blood pressure.  Continue losartan   Morbid obesity -Outpatient follow-up  Hyperlipidemia -Statin on hold    DVT prophylaxis: SCDs Code Status: Full Family Communication: Husband at bedside Disposition Plan: Status is: Inpatient Remains inpatient appropriate because: Of severity of illness.    Consultants: General surgery/cardiology/GI  Procedures: None  Antimicrobials: Rocephin   Subjective: Patient seen and examined at bedside.  No fever, chest pain, worsening breath reported.  Still  complains of mild intermittent upper abdominal pain. Objective: Vitals:   11/25/22 1555 11/25/22 1936 11/26/22 0509 11/26/22 0709  BP: (!) 107/47 (!) 114/54 (!) 117/59 (!) 149/71  Pulse: (!) 59 63 60 62  Resp: '17 18 18 16  '$ Temp: 99 F (37.2 C) 98.7 F (37.1 C) 98.6 F (37 C) 98 F (36.7 C)  TempSrc: Oral Oral Oral Temporal  SpO2: 97% 98% 98% 96%    Intake/Output Summary (Last 24 hours) at 11/26/2022 7062 Last data filed at 11/25/2022 2024 Gross per 24 hour  Intake 240 ml  Output --  Net 240 ml    There were no vitals filed for this visit.  Examination:  General exam: On 2 L oxygen via nasal cannula.  No distress.   Respiratory system: Decreased breath sounds at bases bilaterally with some crackles  cardiovascular system: Currently rate controlled; S1 and S2 are heard Gastrointestinal system: Abdomen is still distended; soft and still slightly tender in the right upper quadrant.  Bowel sounds heard Extremities: Mild extremity edema present; no clubbing  Central nervous system: Awake and alert.  No focal neurological deficits.  Moving extremities skin: No ecchymosis/rashes Psychiatry: Mood, affect and judgment are normal. intermittently smiling.    Data Reviewed: I have personally reviewed following labs and imaging studies  CBC: Recent Labs  Lab 11/23/22 0944 11/24/22 0433 11/25/22 0355 11/26/22 0250  WBC 8.8 12.7* 8.2 5.4  NEUTROABS  --  10.1* 6.0 3.2  HGB 15.6* 14.5 14.5 12.4  HCT 48.6* 43.8 45.7 38.1  MCV 94.6 93.6 97.0 95.0  PLT 320 306 255 376    Basic Metabolic Panel: Recent Labs  Lab 11/23/22 0944 11/24/22 0433 11/25/22 0355 11/26/22 0250  NA 137 138 136 136  K 3.3* 3.2*  3.5 3.1*  CL 98 97* 99 101  CO2 '27 28 26 27  '$ GLUCOSE 177* 112* 132* 103*  BUN '16 10 9 9  '$ CREATININE 1.43* 1.15* 1.23* 1.10*  CALCIUM 9.4 8.8* 8.6* 8.3*  MG  --  2.5* 2.2 2.3    GFR: Estimated Creatinine Clearance: 71.8 mL/min (A) (by C-G formula based on SCr of 1.1 mg/dL  (H)). Liver Function Tests: Recent Labs  Lab 11/23/22 0944 11/24/22 0433 11/25/22 0355 11/26/22 0250  AST 218* 213* 250* 144*  ALT 273* 234* 348* 242*  ALKPHOS 166* 125 143* 124  BILITOT 1.6* 1.5* 5.1* 4.9*  PROT 9.5* 7.5 7.6 6.5  ALBUMIN 4.2 3.2* 3.2* 2.7*    Recent Labs  Lab 11/23/22 0944 11/26/22 0250  LIPASE 30 21    No results for input(s): "AMMONIA" in the last 168 hours. Coagulation Profile: Recent Labs  Lab 11/26/22 0250  INR 1.1   Cardiac Enzymes: No results for input(s): "CKTOTAL", "CKMB", "CKMBINDEX", "TROPONINI" in the last 168 hours. BNP (last 3 results) No results for input(s): "PROBNP" in the last 8760 hours. HbA1C: No results for input(s): "HGBA1C" in the last 72 hours. CBG: Recent Labs  Lab 11/25/22 1556 11/25/22 1937 11/25/22 2322 11/25/22 2349 11/26/22 0319  GLUCAP 114* 134* 100* 106* 108*    Lipid Profile: No results for input(s): "CHOL", "HDL", "LDLCALC", "TRIG", "CHOLHDL", "LDLDIRECT" in the last 72 hours. Thyroid Function Tests: No results for input(s): "TSH", "T4TOTAL", "FREET4", "T3FREE", "THYROIDAB" in the last 72 hours. Anemia Panel: No results for input(s): "VITAMINB12", "FOLATE", "FERRITIN", "TIBC", "IRON", "RETICCTPCT" in the last 72 hours. Sepsis Labs: No results for input(s): "PROCALCITON", "LATICACIDVEN" in the last 168 hours.  Recent Results (from the past 240 hour(s))  Resp panel by RT-PCR (RSV, Flu A&B, Covid) Anterior Nasal Swab     Status: None   Collection Time: 11/23/22 11:31 AM   Specimen: Anterior Nasal Swab  Result Value Ref Range Status   SARS Coronavirus 2 by RT PCR NEGATIVE NEGATIVE Final    Comment: (NOTE) SARS-CoV-2 target nucleic acids are NOT DETECTED.  The SARS-CoV-2 RNA is generally detectable in upper respiratory specimens during the acute phase of infection. The lowest concentration of SARS-CoV-2 viral copies this assay can detect is 138 copies/mL. A negative result does not preclude  SARS-Cov-2 infection and should not be used as the sole basis for treatment or other patient management decisions. A negative result may occur with  improper specimen collection/handling, submission of specimen other than nasopharyngeal swab, presence of viral mutation(s) within the areas targeted by this assay, and inadequate number of viral copies(<138 copies/mL). A negative result must be combined with clinical observations, patient history, and epidemiological information. The expected result is Negative.  Fact Sheet for Patients:  EntrepreneurPulse.com.au  Fact Sheet for Healthcare Providers:  IncredibleEmployment.be  This test is no t yet approved or cleared by the [place] FDA and  has been authorized for detection and/or diagnosis of SARS-CoV-2 by FDA under an Emergency Use Authorization (EUA). This EUA will remain  in effect (meaning this test can be used) for the duration of the COVID-19 declaration under Section 564(b)(1) of the Act, 21 U.S.C.section 360bbb-3(b)(1), unless the authorization is terminated  or revoked sooner.       Influenza A by PCR NEGATIVE NEGATIVE Final   Influenza B by PCR NEGATIVE NEGATIVE Final    Comment: (NOTE) The Xpert Xpress SARS-CoV-2/FLU/RSV plus assay is intended as an aid in the diagnosis of influenza from Nasopharyngeal swab specimens  and should not be used as a sole basis for treatment. Nasal washings and aspirates are unacceptable for Xpert Xpress SARS-CoV-2/FLU/RSV testing.  Fact Sheet for Patients: EntrepreneurPulse.com.au  Fact Sheet for Healthcare Providers: IncredibleEmployment.be  This test is not yet approved or cleared by the [place] FDA and has been authorized for detection and/or diagnosis of SARS-CoV-2 by FDA under an Emergency Use Authorization (EUA). This EUA will remain in effect (meaning this test can be used) for the duration of  the COVID-19 declaration under Section 564(b)(1) of the Act, 21 U.S.C. section 360bbb-3(b)(1), unless the authorization is terminated or revoked.     Resp Syncytial Virus by PCR NEGATIVE NEGATIVE Final    Comment: (NOTE) Fact Sheet for Patients: EntrepreneurPulse.com.au  Fact Sheet for Healthcare Providers: IncredibleEmployment.be  This test is not yet approved or cleared by the [place] FDA and has been authorized for detection and/or diagnosis of SARS-CoV-2 by FDA under an Emergency Use Authorization (EUA). This EUA will remain in effect (meaning this test can be used) for the duration of the COVID-19 declaration under Section 564(b)(1) of the Act, 21 U.S.C. section 360bbb-3(b)(1), unless the authorization is terminated or revoked.  Performed at [facility], [address], [place], [place] [address]   Surgical PCR screen     Status: None   Collection Time: 11/24/22  7:36 AM   Specimen: Nasal Mucosa; Nasal Swab  Result Value Ref Range Status   MRSA, PCR NEGATIVE NEGATIVE Final   Staphylococcus aureus NEGATIVE NEGATIVE Final    Comment: (NOTE) The Xpert SA Assay (FDA approved for NASAL specimens in patients 19 years of age and older), is one component of a comprehensive surveillance program. It is not intended to diagnose infection nor to guide or monitor treatment. Performed at [facility], [place] [address], [place], [place] [address]          Radiology Studies: No results found.      Scheduled Meds:  diclofenac  100 mg Rectal Once   [MAR Hold] enoxaparin (LOVENOX) injection  40 mg Subcutaneous Q24H   [MAR Hold] insulin aspart  0-15 Units Subcutaneous Q4H   [MAR Hold] losartan  100 mg Oral Daily   [MAR Hold] mupirocin ointment  1 Application Nasal BID   Continuous Infusions:  sodium chloride     0.9 % NaCl with KCl 20 mEq / L 75 mL/hr at 11/26/22 0128   [MAR Hold] cefTRIAXone (ROCEPHIN)  IV 2 g  (11/25/22 0820)          Aline August, MD [facility] 11/26/2022, 7:52 AM

## 2022-11-26 NOTE — Anesthesia Postprocedure Evaluation (Signed)
Anesthesia Post Note  Patient: Mary Webb  Procedure(s) Performed: UPPER ENDOSCOPIC ULTRASOUND (EUS) LINEAR     Patient location during evaluation: PACU Anesthesia Type: General Level of consciousness: awake and alert Pain management: pain level controlled Vital Signs Assessment: post-procedure vital signs reviewed and stable Respiratory status: spontaneous breathing, nonlabored ventilation, respiratory function stable and patient connected to nasal cannula oxygen Cardiovascular status: blood pressure returned to baseline and stable Postop Assessment: no apparent nausea or vomiting Anesthetic complications: no   No notable events documented.  Last Vitals:  Vitals:   11/26/22 0900 11/26/22 0924  BP: 129/63 (!) 146/79  Pulse: 64 66  Resp: 17 16  Temp: 36.6 C 36.8 C  SpO2: 95% 97%    Last Pain:  Vitals:   11/26/22 0924  TempSrc: Oral  PainSc:                  Santa Lighter

## 2022-11-26 NOTE — Anesthesia Procedure Notes (Signed)
Procedure Name: Intubation Date/Time: 11/26/2022 7:48 AM  Performed by: Thelma Comp, CRNAPre-anesthesia Checklist: Patient identified, Emergency Drugs available, Suction available and Patient being monitored Patient Re-evaluated:Patient Re-evaluated prior to induction Oxygen Delivery Method: Circle System Utilized Preoxygenation: Pre-oxygenation with 100% oxygen Induction Type: IV induction, Rapid sequence and Cricoid Pressure applied Ventilation: Mask ventilation without difficulty Laryngoscope Size: Mac and 3 Grade View: Grade I Tube type: Oral Tube size: 7.0 mm Number of attempts: 1 Airway Equipment and Method: Stylet Placement Confirmation: ETT inserted through vocal cords under direct vision, positive ETCO2 and breath sounds checked- equal and bilateral Secured at: 21 cm Tube secured with: Tape Dental Injury: Teeth and Oropharynx as per pre-operative assessment

## 2022-11-27 ENCOUNTER — Inpatient Hospital Stay (HOSPITAL_COMMUNITY): Payer: Medicare Other | Admitting: Critical Care Medicine

## 2022-11-27 ENCOUNTER — Inpatient Hospital Stay (HOSPITAL_COMMUNITY): Payer: Medicare Other

## 2022-11-27 ENCOUNTER — Encounter (HOSPITAL_COMMUNITY)
Admission: AD | Disposition: A | Discharge status: Home Health | Payer: Self-pay | Source: Home / Self Care | Attending: Internal Medicine

## 2022-11-27 ENCOUNTER — Encounter (HOSPITAL_COMMUNITY): Payer: Self-pay | Admitting: Family Medicine

## 2022-11-27 DIAGNOSIS — Z950 Presence of cardiac pacemaker: Secondary | ICD-10-CM | POA: Diagnosis not present

## 2022-11-27 DIAGNOSIS — Z8673 Personal history of transient ischemic attack (TIA), and cerebral infarction without residual deficits: Secondary | ICD-10-CM

## 2022-11-27 DIAGNOSIS — M1990 Unspecified osteoarthritis, unspecified site: Secondary | ICD-10-CM | POA: Diagnosis not present

## 2022-11-27 DIAGNOSIS — I10 Essential (primary) hypertension: Secondary | ICD-10-CM | POA: Diagnosis not present

## 2022-11-27 DIAGNOSIS — K8010 Calculus of gallbladder with chronic cholecystitis without obstruction: Secondary | ICD-10-CM

## 2022-11-27 DIAGNOSIS — I495 Sick sinus syndrome: Secondary | ICD-10-CM | POA: Diagnosis not present

## 2022-11-27 DIAGNOSIS — R7989 Other specified abnormal findings of blood chemistry: Secondary | ICD-10-CM | POA: Diagnosis not present

## 2022-11-27 HISTORY — PX: CHOLECYSTECTOMY: SHX55

## 2022-11-27 LAB — COMPREHENSIVE METABOLIC PANEL
ALT: 231 U/L — ABNORMAL HIGH (ref 0–44)
AST: 116 U/L — ABNORMAL HIGH (ref 15–41)
Albumin: 2.9 g/dL — ABNORMAL LOW (ref 3.5–5.0)
Alkaline Phosphatase: 140 U/L — ABNORMAL HIGH (ref 38–126)
Anion gap: 9 (ref 5–15)
BUN: 9 mg/dL (ref 8–23)
CO2: 27 mmol/L (ref 22–32)
Calcium: 8.6 mg/dL — ABNORMAL LOW (ref 8.9–10.3)
Chloride: 100 mmol/L (ref 98–111)
Creatinine, Ser: 1.11 mg/dL — ABNORMAL HIGH (ref 0.44–1.00)
GFR, Estimated: 54 mL/min — ABNORMAL LOW (ref >60)
Glucose, Bld: 105 mg/dL — ABNORMAL HIGH (ref 70–99)
Potassium: 3.5 mmol/L (ref 3.5–5.1)
Sodium: 136 mmol/L (ref 135–145)
Total Bilirubin: 2 mg/dL — ABNORMAL HIGH (ref 0.3–1.2)
Total Protein: 7.1 g/dL (ref 6.5–8.1)

## 2022-11-27 LAB — CBC WITH DIFFERENTIAL/PLATELET
Abs Immature Granulocytes: 0.04 10*3/uL (ref 0.00–0.07)
Basophils Absolute: 0 10*3/uL (ref 0.0–0.1)
Basophils Relative: 0 %
Eosinophils Absolute: 0 10*3/uL (ref 0.0–0.5)
Eosinophils Relative: 0 %
HCT: 39.9 % (ref 36.0–46.0)
Hemoglobin: 13.2 g/dL (ref 12.0–15.0)
Immature Granulocytes: 0 %
Lymphocytes Relative: 9 %
Lymphs Abs: 0.9 10*3/uL (ref 0.7–4.0)
MCH: 31.3 pg (ref 26.0–34.0)
MCHC: 33.1 g/dL (ref 30.0–36.0)
MCV: 94.5 fL (ref 80.0–100.0)
Monocytes Absolute: 0.8 10*3/uL (ref 0.1–1.0)
Monocytes Relative: 8 %
Neutro Abs: 8.5 10*3/uL — ABNORMAL HIGH (ref 1.7–7.7)
Neutrophils Relative %: 83 %
Platelets: 248 10*3/uL (ref 150–400)
RBC: 4.22 MIL/uL (ref 3.87–5.11)
RDW: 13.8 % (ref 11.5–15.5)
WBC: 10.3 10*3/uL (ref 4.0–10.5)
nRBC: 0 % (ref 0.0–0.2)

## 2022-11-27 LAB — GLUCOSE, CAPILLARY
Glucose-Capillary: 107 mg/dL — ABNORMAL HIGH (ref 70–99)
Glucose-Capillary: 121 mg/dL — ABNORMAL HIGH (ref 70–99)
Glucose-Capillary: 143 mg/dL — ABNORMAL HIGH (ref 70–99)
Glucose-Capillary: 146 mg/dL — ABNORMAL HIGH (ref 70–99)
Glucose-Capillary: 152 mg/dL — ABNORMAL HIGH (ref 70–99)
Glucose-Capillary: 153 mg/dL — ABNORMAL HIGH (ref 70–99)
Glucose-Capillary: 158 mg/dL — ABNORMAL HIGH (ref 70–99)

## 2022-11-27 LAB — MAGNESIUM: Magnesium: 2.3 mg/dL (ref 1.7–2.4)

## 2022-11-27 SURGERY — LAPAROSCOPIC CHOLECYSTECTOMY WITH INTRAOPERATIVE CHOLANGIOGRAM
Anesthesia: General

## 2022-11-27 MED ORDER — CHLORHEXIDINE GLUCONATE 0.12 % MT SOLN
OROMUCOSAL | Status: AC
  Administered 2022-11-27: 15 mL via OROMUCOSAL
  Filled 2022-11-27: qty 15

## 2022-11-27 MED ORDER — FENTANYL CITRATE (PF) 250 MCG/5ML IJ SOLN
INTRAMUSCULAR | Status: AC
  Filled 2022-11-27: qty 5

## 2022-11-27 MED ORDER — PHENYLEPHRINE HCL-NACL 20-0.9 MG/250ML-% IV SOLN
INTRAVENOUS | Status: DC | PRN
  Administered 2022-11-27: 30 ug/min via INTRAVENOUS

## 2022-11-27 MED ORDER — SODIUM CHLORIDE 0.9 % IV SOLN
INTRAVENOUS | Status: DC | PRN
  Administered 2022-11-27: 4 mL

## 2022-11-27 MED ORDER — PROPOFOL 10 MG/ML IV BOLUS
INTRAVENOUS | Status: AC
  Filled 2022-11-27: qty 20

## 2022-11-27 MED ORDER — MIDAZOLAM HCL 5 MG/5ML IJ SOLN
INTRAMUSCULAR | Status: DC | PRN
  Administered 2022-11-27: 2 mg via INTRAVENOUS

## 2022-11-27 MED ORDER — ROCURONIUM BROMIDE 10 MG/ML (PF) SYRINGE
PREFILLED_SYRINGE | INTRAVENOUS | Status: DC | PRN
  Administered 2022-11-27 (×3): 10 mg via INTRAVENOUS
  Administered 2022-11-27: 40 mg via INTRAVENOUS

## 2022-11-27 MED ORDER — SODIUM CHLORIDE 0.9 % IV SOLN
INTRAVENOUS | Status: AC
  Filled 2022-11-27: qty 20

## 2022-11-27 MED ORDER — DEXAMETHASONE SODIUM PHOSPHATE 10 MG/ML IJ SOLN
INTRAMUSCULAR | Status: DC | PRN
  Administered 2022-11-27: 4 mg via INTRAVENOUS

## 2022-11-27 MED ORDER — BUPIVACAINE HCL (PF) 0.25 % IJ SOLN
INTRAMUSCULAR | Status: AC
  Filled 2022-11-27: qty 30

## 2022-11-27 MED ORDER — LIDOCAINE 2% (20 MG/ML) 5 ML SYRINGE
INTRAMUSCULAR | Status: DC | PRN
  Administered 2022-11-27: 60 mg via INTRAVENOUS

## 2022-11-27 MED ORDER — FENTANYL CITRATE (PF) 100 MCG/2ML IJ SOLN
25.0000 ug | INTRAMUSCULAR | Status: DC | PRN
  Administered 2022-11-27 (×2): 50 ug via INTRAVENOUS

## 2022-11-27 MED ORDER — 0.9 % SODIUM CHLORIDE (POUR BTL) OPTIME
TOPICAL | Status: DC | PRN
  Administered 2022-11-27: 1000 mL

## 2022-11-27 MED ORDER — LACTATED RINGERS IV SOLN: INTRAVENOUS | Status: DC

## 2022-11-27 MED ORDER — DIPHENHYDRAMINE HCL 50 MG/ML IJ SOLN
INTRAMUSCULAR | Status: DC | PRN
  Administered 2022-11-27: 25 mg via INTRAVENOUS

## 2022-11-27 MED ORDER — OXYCODONE HCL 5 MG/5ML PO SOLN: 5.0000 mg | Freq: Once | ORAL | Status: DC | PRN

## 2022-11-27 MED ORDER — BUPIVACAINE HCL 0.25 % IJ SOLN
INTRAMUSCULAR | Status: DC | PRN
  Administered 2022-11-27: 10 mL

## 2022-11-27 MED ORDER — MIDAZOLAM HCL 2 MG/2ML IJ SOLN
INTRAMUSCULAR | Status: AC
  Filled 2022-11-27: qty 2

## 2022-11-27 MED ORDER — SODIUM CHLORIDE 0.9 % IR SOLN
Status: DC | PRN
  Administered 2022-11-27 (×2): 1000 mL

## 2022-11-27 MED ORDER — CHLORHEXIDINE GLUCONATE 0.12 % MT SOLN: 15.0000 mL | Freq: Once | OROMUCOSAL | Status: AC

## 2022-11-27 MED ORDER — INSULIN ASPART 100 UNIT/ML IJ SOLN: 0.0000 [IU] | INTRAMUSCULAR | Status: DC | PRN

## 2022-11-27 MED ORDER — FENTANYL CITRATE (PF) 100 MCG/2ML IJ SOLN
INTRAMUSCULAR | Status: AC
  Filled 2022-11-27: qty 2

## 2022-11-27 MED ORDER — ONDANSETRON HCL 4 MG/2ML IJ SOLN
INTRAMUSCULAR | Status: DC | PRN
  Administered 2022-11-27: 4 mg via INTRAVENOUS

## 2022-11-27 MED ORDER — FENTANYL CITRATE (PF) 250 MCG/5ML IJ SOLN
INTRAMUSCULAR | Status: DC | PRN
  Administered 2022-11-27 (×3): 50 ug via INTRAVENOUS
  Administered 2022-11-27: 100 ug via INTRAVENOUS

## 2022-11-27 MED ORDER — ORAL CARE MOUTH RINSE: 15.0000 mL | Freq: Once | OROMUCOSAL | Status: AC

## 2022-11-27 MED ORDER — ONDANSETRON HCL 4 MG/2ML IJ SOLN: 4.0000 mg | Freq: Four times a day (QID) | INTRAMUSCULAR | Status: DC | PRN

## 2022-11-27 MED ORDER — PROPOFOL 10 MG/ML IV BOLUS
INTRAVENOUS | Status: DC | PRN
  Administered 2022-11-27: 50 mg via INTRAVENOUS
  Administered 2022-11-27: 20 mg via INTRAVENOUS
  Administered 2022-11-27: 30 mg via INTRAVENOUS
  Administered 2022-11-27: 150 mg via INTRAVENOUS

## 2022-11-27 MED ORDER — SUCCINYLCHOLINE CHLORIDE 200 MG/10ML IV SOSY
PREFILLED_SYRINGE | INTRAVENOUS | Status: DC | PRN
  Administered 2022-11-27: 140 mg via INTRAVENOUS

## 2022-11-27 MED ORDER — SUGAMMADEX SODIUM 200 MG/2ML IV SOLN
INTRAVENOUS | Status: DC | PRN
  Administered 2022-11-27: 350 mg via INTRAVENOUS

## 2022-11-27 MED ORDER — OXYCODONE HCL 5 MG PO TABS: 5.0000 mg | ORAL_TABLET | Freq: Once | ORAL | Status: DC | PRN

## 2022-11-27 SURGICAL SUPPLY — 48 items
APPLIER CLIP 5 13 M/L LIGAMAX5 (MISCELLANEOUS) ×1
BAG COUNTER SPONGE SURGICOUNT (BAG) ×1 IMPLANT
BIOPATCH RED 1 DISK 7.0 (GAUZE/BANDAGES/DRESSINGS) IMPLANT
CANISTER SUCT 3000ML PPV (MISCELLANEOUS) ×1 IMPLANT
CHLORAPREP W/TINT 26 (MISCELLANEOUS) ×1 IMPLANT
CLIP APPLIE 5 13 M/L LIGAMAX5 (MISCELLANEOUS) IMPLANT
CLIP LIGATING HEMO O LOK GREEN (MISCELLANEOUS) ×1 IMPLANT
COVER MAYO STAND STRL (DRAPES) ×1 IMPLANT
COVER SURGICAL LIGHT HANDLE (MISCELLANEOUS) ×1 IMPLANT
DERMABOND ADVANCED .7 DNX12 (GAUZE/BANDAGES/DRESSINGS) ×1 IMPLANT
DRAIN CHANNEL 19F RND (DRAIN) IMPLANT
DRAPE C-ARM 42X120 X-RAY (DRAPES) ×1 IMPLANT
DRSG TEGADERM 2-3/8X2-3/4 SM (GAUZE/BANDAGES/DRESSINGS) IMPLANT
ELECT REM PT RETURN 9FT ADLT (ELECTROSURGICAL) ×1
ELECTRODE REM PT RTRN 9FT ADLT (ELECTROSURGICAL) ×1 IMPLANT
EVACUATOR SILICONE 100CC (DRAIN) IMPLANT
GLOVE SURG SYN 7.5  E (GLOVE) ×1
GLOVE SURG SYN 7.5 E (GLOVE) ×1 IMPLANT
GLOVE SURG SYN 7.5 PF PI (GLOVE) ×1 IMPLANT
GOWN STRL REUS W/ TWL LRG LVL3 (GOWN DISPOSABLE) ×2 IMPLANT
GOWN STRL REUS W/ TWL XL LVL3 (GOWN DISPOSABLE) ×1 IMPLANT
GOWN STRL REUS W/TWL LRG LVL3 (GOWN DISPOSABLE) ×1
GOWN STRL REUS W/TWL XL LVL3 (GOWN DISPOSABLE) ×2
GRASPER SUT TROCAR 14GX15 (MISCELLANEOUS) ×1 IMPLANT
IV CATH 14GX2 1/4 (CATHETERS) ×1 IMPLANT
KIT BASIN OR (CUSTOM PROCEDURE TRAY) ×1 IMPLANT
KIT TURNOVER KIT B (KITS) ×1 IMPLANT
NDL INSUFFLATION 14GA 120MM (NEEDLE) ×1 IMPLANT
NEEDLE INSUFFLATION 14GA 120MM (NEEDLE) ×1 IMPLANT
NS IRRIG 1000ML POUR BTL (IV SOLUTION) ×1 IMPLANT
PAD ARMBOARD 7.5X6 YLW CONV (MISCELLANEOUS) ×2 IMPLANT
POUCH RETRIEVAL ECOSAC 10 (ENDOMECHANICALS) IMPLANT
POUCH RETRIEVAL ECOSAC 10MM (ENDOMECHANICALS) ×1
SCISSORS LAP 5X35 DISP (ENDOMECHANICALS) ×1 IMPLANT
SET CHOLANGIOGRAPHY FRANKLIN (SET/KITS/TRAYS/PACK) ×1 IMPLANT
SET IRRIG TUBING LAPAROSCOPIC (IRRIGATION / IRRIGATOR) ×1 IMPLANT
SET TUBE SMOKE EVAC HIGH FLOW (TUBING) ×1 IMPLANT
SLEEVE ENDOPATH XCEL 5M (ENDOMECHANICALS) ×1 IMPLANT
SPECIMEN JAR SMALL (MISCELLANEOUS) ×1 IMPLANT
STOPCOCK 4 WAY LG BORE MALE ST (IV SETS) ×1 IMPLANT
SUT ETHILON 2 0 FS 18 (SUTURE) IMPLANT
SUT MNCRL AB 4-0 PS2 18 (SUTURE) ×1 IMPLANT
TOWEL GREEN STERILE FF (TOWEL DISPOSABLE) ×1 IMPLANT
TRAY LAPAROSCOPIC MC (CUSTOM PROCEDURE TRAY) ×1 IMPLANT
TROCAR XCEL NON-BLD 11X100MML (ENDOMECHANICALS) ×1 IMPLANT
TROCAR Z-THREAD OPTICAL 5X100M (TROCAR) ×1 IMPLANT
WARMER LAPAROSCOPE (MISCELLANEOUS) ×1 IMPLANT
WATER STERILE IRR 1000ML POUR (IV SOLUTION) ×1 IMPLANT

## 2022-11-27 NOTE — Progress Notes (Signed)
Received pt from PACU via bed. Patient is sleepy, easily arousable.  Skin glue x 3 clean, dry, intact, open to air.  RUQ JP drain intact, charged, with serous-sanguinous drainage.  Patient c/o abdominal pain 9/10, PACU RN recently gave pain medicine before leaving PACU, will follow through.  Assisted in position of comfort, warm blankets provided.  Family at bedside.  Care plan and pain management discussed with patient and family.  Call bell within reach.

## 2022-11-27 NOTE — Progress Notes (Signed)
Day of Surgery   Subjective/Chief Complaint: Pt doing well   Objective: Vital signs in last 24 hours: Temp:  [97.3 F (36.3 C)-98.6 F (37 C)] 98.3 F (36.8 C) (01/14 0601) Pulse Rate:  [59-81] 78 (01/14 0601) Resp:  [16-24] 19 (01/14 0601) BP: (119-160)/(44-79) 145/63 (01/14 0601) SpO2:  [90 %-98 %] 95 % (01/14 0601) Weight:  [154.2 kg] 154.2 kg (01/14 0713) Last BM Date : 11/23/22  Intake/Output from previous day: 01/13 0701 - 01/14 0700 In: 300 [I.V.:300] Out: -  Intake/Output this shift: No intake/output data recorded.  PE:  Constitutional: No acute distress, conversant, appears states age. Eyes: Anicteric sclerae, moist conjunctiva, no lid lag Lungs: Clear to auscultation bilaterally, normal respiratory effort CV: regular rate and rhythm, no murmurs, no peripheral edema, pedal pulses 2+ GI: Soft, no masses or hepatosplenomegaly, non-tender to palpation Skin: No rashes, palpation reveals normal turgor Psychiatric: appropriate judgment and insight, oriented to person, place, and time   Lab Results:  Recent Labs    11/26/22 0250 11/27/22 0234  WBC 5.4 10.3  HGB 12.4 13.2  HCT 38.1 39.9  PLT 234 248   BMET Recent Labs    11/26/22 0250 11/27/22 0234  NA 136 136  K 3.1* 3.5  CL 101 100  CO2 27 27  GLUCOSE 103* 105*  BUN 9 9  CREATININE 1.10* 1.11*  CALCIUM 8.3* 8.6*   PT/INR Recent Labs    11/26/22 0250  LABPROT 14.3  INR 1.1   ABG No results for input(s): "PHART", "HCO3" in the last 72 hours.  Invalid input(s): "PCO2", "PO2"  Studies/Results: No results found.  Anti-infectives: Anti-infectives (From admission, onward)    Start     Dose/Rate Route Frequency Ordered Stop   11/27/22 0705  sodium chloride 0.9 % with cefTRIAXone (ROCEPHIN) ADS Med       Note to Pharmacy: Altamese Odem: cabinet override      11/27/22 0705 11/27/22 1914   11/24/22 0800  [MAR Hold]  cefTRIAXone (ROCEPHIN) 2 g in sodium chloride 0.9 % 100 mL IVPB        (MAR  Hold since Sun 11/27/2022 at 0657.Hold Reason: Transfer to a Procedural area)   2 g 200 mL/hr over 30 Minutes Intravenous Every 24 hours 11/24/22 0743         Assessment/Plan: Cholecystitis with gallstones.    Plan for OR today, lap chole All risks and benefits were discussed with the patient to generally include: infection, bleeding, possible need for post op ERCP, damage to the bile ducts, and bile leak. Alternatives were offered and described.  All questions were answered and the patient voiced understanding of the procedure and wishes to proceed at this point with a laparoscopic cholecystectomy    LOS: 3 days    Ralene Ok 11/27/2022

## 2022-11-27 NOTE — Progress Notes (Signed)
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  On presentation, she was found to have elevated LFTs.  CT of abdomen and pelvis with contrast showed mild pericholecystic stranding, right upper quadrant ultrasound showed sludge and small mobile stones in the gallbladder.  MRCP could not be done because of patient having pacemaker.  She was transferred to [facility] for further general surgical evaluation.  She was started on antibiotics.  General surgery and subsequently cardiology were consulted.  Assessment & Plan:   Possible acute calculus cholecystitis Elevated LFTs -Continue IV Rocephin.   -Status post EUS on 11/26/2022 which did not show any CBD stones.  General surgery planning for cystectomy today. -Monitor LFTs: Improving.  History of sick sinus syndrome status post pacemaker -Outpatient follow-up with EP: Dual-chamber generator change out will have to be rescheduled as an outpatient.  Leukocytosis -Resolved  Hypokalemia -Improved  Chronic kidney disease stage IIIa -Creatinine stable.  Monitor  Diabetes mellitus type 2 -continue CBGs with SSI  Hypertension --monitor blood pressure.  Continue losartan   Morbid obesity -Outpatient follow-up  Hyperlipidemia -Statin on hold    DVT prophylaxis: SCDs Code Status: Full Family Communication: Husband at bedside Disposition Plan: Status is: Inpatient Remains inpatient appropriate because: Of severity of illness.    Consultants: General surgery/cardiology/GI  Procedures: EUS on 11/26/2022  Antimicrobials: Rocephin   Subjective: Patient seen and examined at bedside.  Denies worsening abdominal pain, fever, vomiting or chest  pain. Objective: Vitals:   11/26/22 1439 11/26/22 2017 11/27/22 0601 11/27/22 0713  BP: (!) 147/68 (!) 160/66 (!) 145/63   Pulse: (!) 59 (!) 59 78   Resp: 16  19   Temp: 98.2 F (36.8 C) 98.6 F (37 C) 98.3 F (36.8 C)   TempSrc: Oral Oral Oral   SpO2: 97% 98% 95%   Weight:    (!) 154.2 kg  Height:    5' 2.01" (1.575 m)    Intake/Output Summary (Last 24 hours) at 11/27/2022 0730 Last data filed at 11/26/2022 3016 Gross per 24 hour  Intake 300 ml  Output --  Net 300 ml    Filed Weights   11/27/22 0713  Weight: (!) 154.2 kg    Examination:  General: On room air.  No acute distress.  respiratory: Bilateral decreased breath sounds at bases with crackles CVS: S1-S2 heard; rate controlled currently abdominal: Soft, morbidly obese, slightly tender in the right upper quadrant, slightly distended, no organomegaly; normal bowel sounds are heard  extremities: Trace lower extremity edema; no cyanosis   Data Reviewed: I have personally reviewed following labs and imaging studies  CBC: Recent Labs  Lab 11/23/22 0944 11/24/22 0433 11/25/22 0355 11/26/22 0250 11/27/22 0234  WBC 8.8 12.7* 8.2 5.4 10.3  NEUTROABS  --  10.1* 6.0 3.2 8.5*  HGB 15.6* 14.5 14.5 12.4 13.2  HCT 48.6* 43.8 45.7 38.1 39.9  MCV 94.6 93.6 97.0 95.0 94.5  PLT 320 306 255 234 010    Basic Metabolic Panel: Recent Labs  Lab 11/23/22 0944 11/24/22 0433 11/25/22 0355 11/26/22 0250 11/27/22 0234  NA 137 138 136 136 136  K 3.3* 3.2* 3.5 3.1* 3.5  CL 98 97* 99 101 100  CO2 '27 28 26 27 '$ 27  GLUCOSE 177* 112* 132* 103* 105*  BUN '16 10 9 9 9  '$ CREATININE 1.43* 1.15* 1.23* 1.10* 1.11*  CALCIUM 9.4 8.8* 8.6* 8.3* 8.6*  MG  --  2.5* 2.2 2.3 2.3    GFR: Estimated Creatinine Clearance: 71.2 mL/min (A) (by C-G formula based on SCr of 1.11 mg/dL (H)). Liver Function Tests: Recent Labs  Lab 11/23/22 0944 11/24/22 0433 11/25/22 0355 11/26/22 0250 11/27/22 0234  AST 218* 213* 250* 144* 116*  ALT 273*  234* 348* 242* 231*  ALKPHOS 166* 125 143* 124 140*  BILITOT 1.6* 1.5* 5.1* 4.9* 2.0*  PROT 9.5* 7.5 7.6 6.5 7.1  ALBUMIN 4.2 3.2* 3.2* 2.7* 2.9*    Recent Labs  Lab 11/23/22 0944 11/26/22 0250  LIPASE 30 21    No results for input(s): "AMMONIA" in the last 168 hours. Coagulation Profile: Recent Labs  Lab 11/26/22 0250  INR 1.1    Cardiac Enzymes: No results for input(s): "CKTOTAL", "CKMB", "CKMBINDEX", "TROPONINI" in the last 168 hours. BNP (last 3 results) No results for input(s): "PROBNP" in the last 8760 hours. HbA1C: No results for input(s): "HGBA1C" in the last 72 hours. CBG: Recent Labs  Lab 11/26/22 1140 11/26/22 1612 11/26/22 2014 11/26/22 2337 11/27/22 0423  GLUCAP 144* 185* 170* 141* 121*    Lipid Profile: No results for input(s): "CHOL", "HDL", "LDLCALC", "TRIG", "CHOLHDL", "LDLDIRECT" in the last 72 hours. Thyroid Function Tests: No results for input(s): "TSH", "T4TOTAL", "FREET4", "T3FREE", "THYROIDAB" in the last 72 hours. Anemia Panel: No results for input(s): "VITAMINB12", "FOLATE", "FERRITIN", "TIBC", "IRON", "RETICCTPCT" in the last 72 hours. Sepsis Labs: No results for input(s): "PROCALCITON", "LATICACIDVEN" in the last 168 hours.  Recent Results (from the past 240 hour(s))  Resp panel by RT-PCR (RSV, Flu A&B, Covid) Anterior Nasal Swab     Status: None   Collection Time: 11/23/22 11:31 AM   Specimen: Anterior Nasal Swab  Result Value Ref Range Status   SARS Coronavirus 2 by RT PCR NEGATIVE NEGATIVE Final    Comment: (NOTE) SARS-CoV-2 target nucleic acids are NOT DETECTED.  The SARS-CoV-2 RNA is generally detectable in upper respiratory specimens during the acute phase of infection. The lowest concentration of SARS-CoV-2 viral copies this assay can detect is 138 copies/mL. A negative result does not preclude SARS-Cov-2 infection and should not be used as the sole basis for treatment or other patient management decisions. A negative  result may occur with  improper specimen collection/handling, submission of specimen other than nasopharyngeal swab, presence of viral mutation(s) within the areas targeted by this assay, and inadequate number of viral copies(<138 copies/mL). A negative result must be combined with clinical observations, patient history, and epidemiological information. The expected result is Negative.  Fact Sheet for Patients:  EntrepreneurPulse.com.au  Fact Sheet for Healthcare Providers:  IncredibleEmployment.be  This test is no t yet approved or cleared by the [place] FDA and  has been authorized for detection and/or diagnosis of SARS-CoV-2 by FDA under an Emergency Use Authorization (EUA). This EUA will remain  in effect (meaning this test can be used) for the duration of the COVID-19 declaration under Section 564(b)(1) of the Act, 21 U.S.C.section 360bbb-3(b)(1), unless the authorization is terminated  or revoked sooner.       Influenza A by PCR NEGATIVE NEGATIVE Final   Influenza B by PCR NEGATIVE NEGATIVE Final    Comment: (NOTE) The Xpert Xpress SARS-CoV-2/FLU/RSV plus assay is intended as an aid in the diagnosis of influenza from Nasopharyngeal swab specimens and  should not be used as a sole basis for treatment. Nasal washings and aspirates are unacceptable for Xpert Xpress SARS-CoV-2/FLU/RSV testing.  Fact Sheet for Patients: EntrepreneurPulse.com.au  Fact Sheet for Healthcare Providers: IncredibleEmployment.be  This test is not yet approved or cleared by the [place] FDA and has been authorized for detection and/or diagnosis of SARS-CoV-2 by FDA under an Emergency Use Authorization (EUA). This EUA will remain in effect (meaning this test can be used) for the duration of the COVID-19 declaration under Section 564(b)(1) of the Act, 21 U.S.C. section 360bbb-3(b)(1), unless the authorization is  terminated or revoked.     Resp Syncytial Virus by PCR NEGATIVE NEGATIVE Final    Comment: (NOTE) Fact Sheet for Patients: EntrepreneurPulse.com.au  Fact Sheet for Healthcare Providers: IncredibleEmployment.be  This test is not yet approved or cleared by the [place] FDA and has been authorized for detection and/or diagnosis of SARS-CoV-2 by FDA under an Emergency Use Authorization (EUA). This EUA will remain in effect (meaning this test can be used) for the duration of the COVID-19 declaration under Section 564(b)(1) of the Act, 21 U.S.C. section 360bbb-3(b)(1), unless the authorization is terminated or revoked.  Performed at [facility], [address], [place], [place] [address]   Surgical PCR screen     Status: None   Collection Time: 11/24/22  7:36 AM   Specimen: Nasal Mucosa; Nasal Swab  Result Value Ref Range Status   MRSA, PCR NEGATIVE NEGATIVE Final   Staphylococcus aureus NEGATIVE NEGATIVE Final    Comment: (NOTE) The Xpert SA Assay (FDA approved for NASAL specimens in patients 36 years of age and older), is one component of a comprehensive surveillance program. It is not intended to diagnose infection nor to guide or monitor treatment. Performed at [facility], [place] [place], [place] [address]          Radiology Studies: No results found.      Scheduled Meds:  [MAR Hold] diclofenac  100 mg Rectal Once   [MAR Hold] enoxaparin (LOVENOX) injection  40 mg Subcutaneous Q24H   [MAR Hold] insulin aspart  0-15 Units Subcutaneous Q4H   [MAR Hold] losartan  100 mg Oral Daily   [MAR Hold] mupirocin ointment  1 Application Nasal BID   Continuous Infusions:  0.9 % NaCl with KCl 20 mEq / L 75 mL/hr at 11/27/22 0141   [MAR Hold] cefTRIAXone (ROCEPHIN)  IV Stopped (11/26/22 1741)   lactated ringers 10 mL/hr at 11/27/22 0715   sodium chloride 0.9 % with cefTRIAXone (ROCEPHIN) ADS Med             Aline August, MD [facility] 11/27/2022, 7:30 AM

## 2022-11-27 NOTE — Op Note (Signed)
11/27/2022  9:15 AM  PATIENT:  Mary Webb  67 y.o. female  PRE-OPERATIVE DIAGNOSIS:  Cholecystitis  POST-OPERATIVE DIAGNOSIS:  Cholecystitis  PROCEDURE:  Procedure(s): LAPAROSCOPIC CHOLECYSTECTOMY WITH INTRAOPERATIVE CHOLANGIOGRAM (N/A)  SURGEON:  Surgeon(s) and Role:    * Ralene Ok, MD - Primary  ANESTHESIA:   local and general  EBL:  minimal   BLOOD ADMINISTERED:none  DRAINS: (19Fr) Jackson-Pratt drain(s) with closed bulb suction in the RUQ space   LOCAL MEDICATIONS USED:  BUPIVICAINE   SPECIMEN:  Source of Specimen:  gallbladder  DISPOSITION OF SPECIMEN:  PATHOLOGY  COUNTS:  YES  TOURNIQUET:  * No tourniquets in log *  DICTATION: .Dragon Dictation  Findings: Patient with multiple cholelithiasis.  There was some spillage of stones within the abdominal cavity.  Was not able to complete a full cholangiogram secondary to leakage of the contrast.  There appeared to be a hole within the cystic duct distal to the area of the proximal cystic duct.  I dissected down towards the junction of the common bile duct.  I was able to place 2 clips what appeared to be distal to the area of the hole within the cystic duct.  There appeared to be no bile leakage at the end of the case.  A 19 [place] Blake drain was placed to right upper quadrant subhepatic space to ensure no bile leakage.  A majority of the spilled gallstones were removed. Details of procedure:    The patient was taken to the operating and placed in the supine position with bilateral SCDs in place.  The patient was prepped and draped in the usual sterile fashion. A time out was called and all facts were verified. A pneumoperitoneum was obtained via A Veress needle technique to a pressure of 7m of mercury.  A 528mtrochar was then placed in the right upper quadrant under visualization, and there were no injuries to any abdominal organs. A 11 mm port was then placed in the umbilical region after infiltrating with  local anesthesia under direct visualization. A second and third epigastric port and right lower quadrant port placement under direct visualization, respectively.     The gallbladder was identified and retracted, the peritoneum was then sharply dissected from the gallbladder and this dissection was carried down to Calot's triangle.  Upon retracting the gallbladder there was some spillage of stones within the abdominal cavity.  The cystic duct was identified and stripped away circumferentially and seen going into the gallbladder 360, the critical angle was obtained. A Cook catheter was used to perform an intraoperative cholangiogram.  I was unable to fully complete the cholangiogram as there was 1.  The leakage of the contrast out of the cystic duct.  The some leakage distal to the area but I placed the cystic duct.  I dissected down towards the common bile duct junction.  Once the cholangiogram catheter was removed.  I felt was able to place 2 hemoclips distal to the area to hold the cystic duct.  The perirenal bile draining from the cystic duct.      We then proceeded to remove the gallbladder off the hepatic fossa with Bovie cautery. A retrieval bag was then placed in the abdomen and gallbladder placed in the bag. The hepatic fossa was then reexamined and hemostasis was achieved with Bovie cautery and was excellent at the end of the case.  I was able to extract most of the spilled stones. The subhepatic fossa and perihepatic fossa was then irrigated until  the effluent was clear. The 11 mm trocar fascia was reapproximated with the PMI & a #1 Vicryl x2.  The pneumoperitoneum was evacuated and all trochars removed under direct visulalization.  The skin was then closed with 4-0 Monocryl and the skin dressed with Dermabond.  The patient was awaken from general anesthesia and taken to the recovery room in stable condition.   PLAN OF CARE: Admit to inpatient   PATIENT DISPOSITION:  PACU - hemodynamically  stable.   Delay start of Pharmacological VTE agent (>24hrs) due to surgical blood loss or risk of bleeding: no

## 2022-11-27 NOTE — Anesthesia Procedure Notes (Signed)
Procedure Name: Intubation Date/Time: 11/27/2022 7:51 AM  Performed by: Wilburn Cornelia, CRNAPre-anesthesia Checklist: Patient identified, Emergency Drugs available, Suction available, Patient being monitored and Timeout performed Patient Re-evaluated:Patient Re-evaluated prior to induction Oxygen Delivery Method: Circle system utilized Preoxygenation: Pre-oxygenation with 100% oxygen Induction Type: IV induction Ventilation: Mask ventilation without difficulty Laryngoscope Size: Mac and 3 Grade View: Grade I Tube type: Oral Tube size: 7.0 mm Number of attempts: 1 Airway Equipment and Method: Stylet Placement Confirmation: ETT inserted through vocal cords under direct vision, positive ETCO2, CO2 detector and breath sounds checked- equal and bilateral Secured at: 21 cm Tube secured with: Tape Dental Injury: Teeth and Oropharynx as per pre-operative assessment

## 2022-11-27 NOTE — Transfer of Care (Signed)
Immediate Anesthesia Transfer of Care Note  Patient: Mary Webb  Procedure(s) Performed: LAPAROSCOPIC CHOLECYSTECTOMY WITH INTRAOPERATIVE CHOLANGIOGRAM  Patient Location: PACU  Anesthesia Type:General  Level of Consciousness: awake, alert , and oriented  Airway & Oxygen Therapy: Patient Spontanous Breathing and Patient connected to nasal cannula oxygen  Post-op Assessment: Report given to RN and Post -op Vital signs reviewed and stable  Post vital signs: Reviewed and stable  Last Vitals:  Vitals Value Taken Time  BP 92/80 11/27/22 0937  Temp    Pulse 67 11/27/22 0939  Resp 17 11/27/22 0939  SpO2 88 % 11/27/22 0939  Vitals shown include unvalidated device data.  Last Pain:  Vitals:   11/27/22 0601  TempSrc: Oral  PainSc:          Complications: No notable events documented.

## 2022-11-27 NOTE — Anesthesia Preprocedure Evaluation (Signed)
Anesthesia Evaluation  Patient identified by MRN, date of birth, ID band Patient awake    Reviewed: Allergy & Precautions, H&P , NPO status , Patient's Chart, lab work & pertinent test results  Airway Mallampati: II   Neck ROM: full    Dental   Pulmonary shortness of breath   breath sounds clear to auscultation       Cardiovascular hypertension, + dysrhythmias + pacemaker  Rhythm:regular Rate:Normal     Neuro/Psych  PSYCHIATRIC DISORDERS  Depression    CVA    GI/Hepatic cholecystitis   Endo/Other    Renal/GU Renal disease     Musculoskeletal  (+) Arthritis ,    Abdominal   Peds  Hematology   Anesthesia Other Findings   Reproductive/Obstetrics                             Anesthesia Physical Anesthesia Plan  ASA: 3  Anesthesia Plan: General   Post-op Pain Management:    Induction: Intravenous  PONV Risk Score and Plan: 3 and Ondansetron, Dexamethasone, Midazolam and Treatment may vary due to age or medical condition  Airway Management Planned: Oral ETT  Additional Equipment:   Intra-op Plan:   Post-operative Plan: Extubation in OR  Informed Consent: I have reviewed the patients History and Physical, chart, labs and discussed the procedure including the risks, benefits and alternatives for the proposed anesthesia with the patient or authorized representative who has indicated his/her understanding and acceptance.     Dental advisory given  Plan Discussed with: CRNA, Anesthesiologist and Surgeon  Anesthesia Plan Comments:        Anesthesia Quick Evaluation

## 2022-11-27 NOTE — Anesthesia Postprocedure Evaluation (Signed)
Anesthesia Post Note  Patient: Mary Webb  Procedure(s) Performed: LAPAROSCOPIC CHOLECYSTECTOMY WITH INTRAOPERATIVE CHOLANGIOGRAM     Patient location during evaluation: PACU Anesthesia Type: General Level of consciousness: awake and alert Pain management: pain level controlled Vital Signs Assessment: post-procedure vital signs reviewed and stable Respiratory status: spontaneous breathing, nonlabored ventilation, respiratory function stable and patient connected to nasal cannula oxygen Cardiovascular status: blood pressure returned to baseline and stable Postop Assessment: no apparent nausea or vomiting Anesthetic complications: no   No notable events documented.  Last Vitals:  Vitals:   11/27/22 1010 11/27/22 1031  BP: (!) 151/71 (!) 158/84  Pulse: 67 67  Resp: 18 18  Temp: 36.9 C (!) 36.4 C  SpO2: 91% 96%    Last Pain:  Vitals:   11/27/22 1031  TempSrc: Oral  PainSc: [place]

## 2022-11-28 ENCOUNTER — Other Ambulatory Visit: Payer: Self-pay | Admitting: Nurse Practitioner

## 2022-11-28 ENCOUNTER — Encounter (HOSPITAL_COMMUNITY): Payer: Self-pay | Admitting: General Surgery

## 2022-11-28 DIAGNOSIS — R7989 Other specified abnormal findings of blood chemistry: Secondary | ICD-10-CM | POA: Diagnosis not present

## 2022-11-28 LAB — COMPREHENSIVE METABOLIC PANEL
ALT: 199 U/L — ABNORMAL HIGH (ref 0–44)
AST: 102 U/L — ABNORMAL HIGH (ref 15–41)
Albumin: 2.8 g/dL — ABNORMAL LOW (ref 3.5–5.0)
Alkaline Phosphatase: 104 U/L (ref 38–126)
Anion gap: 9 (ref 5–15)
BUN: 8 mg/dL (ref 8–23)
CO2: 26 mmol/L (ref 22–32)
Calcium: 8.3 mg/dL — ABNORMAL LOW (ref 8.9–10.3)
Chloride: 100 mmol/L (ref 98–111)
Creatinine, Ser: 0.99 mg/dL (ref 0.44–1.00)
GFR, Estimated: >60 mL/min (ref >60)
Glucose, Bld: 105 mg/dL — ABNORMAL HIGH (ref 70–99)
Potassium: 3.3 mmol/L — ABNORMAL LOW (ref 3.5–5.1)
Sodium: 135 mmol/L (ref 135–145)
Total Bilirubin: 1.2 mg/dL (ref 0.3–1.2)
Total Protein: 6.6 g/dL (ref 6.5–8.1)

## 2022-11-28 LAB — GLUCOSE, CAPILLARY
Glucose-Capillary: 110 mg/dL — ABNORMAL HIGH (ref 70–99)
Glucose-Capillary: 113 mg/dL — ABNORMAL HIGH (ref 70–99)
Glucose-Capillary: 118 mg/dL — ABNORMAL HIGH (ref 70–99)
Glucose-Capillary: 120 mg/dL — ABNORMAL HIGH (ref 70–99)
Glucose-Capillary: 122 mg/dL — ABNORMAL HIGH (ref 70–99)
Glucose-Capillary: 134 mg/dL — ABNORMAL HIGH (ref 70–99)
Glucose-Capillary: 157 mg/dL — ABNORMAL HIGH (ref 70–99)

## 2022-11-28 LAB — MAGNESIUM: Magnesium: 2 mg/dL (ref 1.7–2.4)

## 2022-11-28 MED ORDER — ACETAMINOPHEN 500 MG PO TABS
1000.0000 mg | ORAL_TABLET | Freq: Four times a day (QID) | ORAL | Status: DC
  Administered 2022-11-28 – 2022-11-30 (×9): 1000 mg via ORAL
  Filled 2022-11-28 (×9): qty 2

## 2022-11-28 MED ORDER — ALBUTEROL SULFATE (2.5 MG/3ML) 0.083% IN NEBU
2.5000 mg | INHALATION_SOLUTION | RESPIRATORY_TRACT | Status: DC | PRN
  Administered 2022-11-28 – 2022-11-29 (×3): 2.5 mg via RESPIRATORY_TRACT
  Filled 2022-11-28 (×3): qty 3

## 2022-11-28 MED ORDER — TRAMADOL HCL 50 MG PO TABS
50.0000 mg | ORAL_TABLET | Freq: Four times a day (QID) | ORAL | Status: DC | PRN
  Administered 2022-11-28 – 2022-11-29 (×3): 50 mg via ORAL
  Filled 2022-11-28 (×3): qty 1

## 2022-11-28 MED ORDER — ENOXAPARIN SODIUM 80 MG/0.8ML IJ SOSY
0.5000 mg/kg | PREFILLED_SYRINGE | INTRAMUSCULAR | Status: DC
  Administered 2022-11-29 – 2022-11-30 (×2): 77.5 mg via SUBCUTANEOUS
  Filled 2022-11-28 (×2): qty 0.8

## 2022-11-28 MED ORDER — POTASSIUM CHLORIDE 20 MEQ PO PACK
40.0000 meq | PACK | Freq: Once | ORAL | Status: AC
  Administered 2022-11-28: 40 meq via ORAL
  Filled 2022-11-28: qty 2

## 2022-11-28 MED ORDER — ENOXAPARIN SODIUM 30 MG/0.3ML IJ SOSY
30.0000 mg | PREFILLED_SYRINGE | Freq: Once | INTRAMUSCULAR | Status: AC
  Administered 2022-11-28: 30 mg via SUBCUTANEOUS
  Filled 2022-11-28: qty 0.3

## 2022-11-28 MED ORDER — FUROSEMIDE 10 MG/ML IJ SOLN
60.0000 mg | Freq: Once | INTRAMUSCULAR | Status: AC
  Administered 2022-11-28: 60 mg via INTRAVENOUS
  Filled 2022-11-28: qty 6

## 2022-11-28 NOTE — Progress Notes (Signed)
Mobility Specialist - Progress Note   11/28/22 1500  Mobility  Activity Ambulated with assistance in hallway  Level of Assistance Contact guard assist, steadying assist  Assistive Device Front wheel walker  Distance Ambulated (ft) 200 ft  Activity Response Tolerated well  Mobility Referral Yes  $Mobility charge 1 Mobility    Pt received in BR agreeable to mobility. Took standing break x2 d/t c/o SOB. Left sitting EOB w/ all needs met.   [place] Specialist Please contact via SecureChat or Rehab office at [phone]

## 2022-11-28 NOTE — Care Management Important Message (Signed)
Important Message  Patient Details  Name: Mary Webb MRN: [id number] Date of Birth: 06/09/1956   Medicare Important Message Given:  Other (see comment)     Hannah Beat 11/28/2022, 10:38 AM

## 2022-11-28 NOTE — Discharge Instructions (Signed)
[facility] LAPAROSCOPIC SURGERY: POST OP INSTRUCTIONS Always review your discharge instruction sheet given to you by the facility where your surgery was performed. IF YOU HAVE DISABILITY OR FAMILY LEAVE FORMS, YOU MUST BRING THEM TO THE OFFICE FOR PROCESSING.   DO NOT GIVE THEM TO YOUR DOCTOR.  PAIN CONTROL  First take acetaminophen (Tylenol) AND/or ibuprofen (Advil) to control your pain after surgery.  Follow directions on package.  Taking acetaminophen (Tylenol) and/or ibuprofen (Advil) regularly after surgery will help to control your pain and lower the amount of prescription pain medication you may need.  You should not take more than 3,000 mg (3 grams) of acetaminophen (Tylenol) in 24 hours.  You should not take ibuprofen (Advil), aleve, motrin, naprosyn or other NSAIDS if you have a history of stomach ulcers or chronic kidney disease.  A prescription for pain medication may be given to you upon discharge.  Take your pain medication as prescribed, if you still have uncontrolled pain after taking acetaminophen (Tylenol) or ibuprofen (Advil). Use ice packs to help control pain. If you need a refill on your pain medication, please contact your pharmacy.  They will contact our office to request authorization. Prescriptions will not be filled after 5pm or on week-ends.  HOME MEDICATIONS Take your usually prescribed medications unless otherwise directed.  DIET You should follow a light diet the first few days after arrival home.  Be sure to include lots of fluids daily. Avoid fatty, fried foods.   CONSTIPATION It is common to experience some constipation after surgery and if you are taking pain medication.  Increasing fluid intake and taking a stool softener (such as Colace) will usually help or prevent this problem from occurring.  A mild laxative (Milk of Magnesia or Miralax) should be taken according to package instructions if there are no bowel movements after 48  hours.  WOUND/INCISION CARE Most patients will experience some swelling and bruising in the area of the incisions.  Ice packs will help.  Swelling and bruising can take several days to resolve.  Unless discharge instructions indicate otherwise, follow guidelines below  STERI-STRIPS - you may remove your outer bandages 48 hours after surgery, and you may shower at that time.  You have steri-strips (small skin tapes) in place directly over the incision.  These strips should be left on the skin for 7-10 days.   DERMABOND/SKIN GLUE - you may shower in 24 hours.  The glue will flake off over the next 2-3 weeks. Any sutures or staples will be removed at the office during your follow-up visit.  ACTIVITIES You may resume regular (light) daily activities beginning the next day--such as daily self-care, walking, climbing stairs--gradually increasing activities as tolerated.  You may have sexual intercourse when it is comfortable.  Refrain from any heavy lifting or straining until approved by your doctor. You may drive when you are no longer taking prescription pain medication, you can comfortably wear a seatbelt, and you can safely maneuver your car and apply brakes.  FOLLOW-UP You should see your doctor in the office for a follow-up appointment approximately 2-3 weeks after your surgery.  You should have been given your post-op/follow-up appointment when your surgery was scheduled.  If you did not receive a post-op/follow-up appointment, make sure that you call for this appointment within a day or two after you arrive home to insure a convenient appointment time.   WHEN TO CALL YOUR DOCTOR: Fever over 101.0 Inability to urinate Continued bleeding from incision.   Increased pain, redness, or drainage from the incision. Increasing abdominal pain  The clinic staff is available to answer your questions during regular business hours.  Please don't hesitate to call and ask to speak to one of the nurses for  clinical concerns.  If you have a medical emergency, go to the nearest emergency room or call 911.  A surgeon from [facility] is always on call at the hospital. [address], [address], [place], [place]  [address] ? [address] [address], [place], [place]   [address] [phone] ? [phone] ? FAX [phone] Web site: [email or web address]  

## 2022-11-28 NOTE — Telephone Encounter (Signed)
Called pharmacy and spoke to [facility] and no refills left.   Requested Prescriptions  Pending Prescriptions Disp Refills   Accu-Chek Softclix Lancets lancets [Pharmacy Med Name: ACCU-CHEK SOFTCLIX LANCETS] 200 each 2    Sig: Use to check blood sugar 3 times a day and document for visits. Goal is <130 fasting and <180 two hours after meal.,     Endocrinology: Diabetes - Testing Supplies Passed - 11/28/2022  9:04 AM      Passed - Valid encounter within last 12 months    Recent Outpatient Visits           2 months ago History of [place], [place] T, NP   4 months ago Pituitary adenoma [facility])   [facility] [name], NP   10 months ago Annual physical exam   [facility] [name], NP   1 year ago Hypertensive kidney disease with stage 3b chronic kidney disease ([place])   [place] [name], NP   1 year ago Annual physical exam   [facility] [name], NP       Future Appointments             In 1 month [name], NP [place] test strip [place] Med Name: ACCU-CHEK GUIDE TEST STRIP 100] 200 each 2    Sig: Use to check blood sugar 3 times a day and document for visits. Goal is <130 fasting and <180 two hours after meal.,     Endocrinology: Diabetes - Testing Supplies Passed - 11/28/2022  9:04 AM      Passed - Valid encounter within last 12 months    Recent Outpatient Visits           2 months ago History of [place], [place] T, NP   4 months ago Pituitary adenoma [facility])   [facility] [name], NP   10 months ago Annual physical exam   [facility] [name], NP   1 year ago Hypertensive kidney disease with stage 3b chronic kidney disease ([place])   [place] [name], NP   1 year ago Annual physical exam    [facility] [name], NP       Future Appointments             In 1 month [name], NP [facility], [place]

## 2022-11-28 NOTE — Progress Notes (Signed)
1/15 I spoke to patient's spouse Tharon Aquas) via telephone and explained IMM Letter and received confirmation. Letter will be mailed to the address on file.

## 2022-11-28 NOTE — Progress Notes (Signed)
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  On presentation, she was found to have elevated LFTs.  CT of abdomen and pelvis with contrast showed mild pericholecystic stranding, right upper quadrant ultrasound showed sludge and small mobile stones in the gallbladder.  MRCP could not be done because of patient having pacemaker.  She was transferred to [facility] for further general surgical evaluation.  She was started on antibiotics.  General surgery and subsequently cardiology were consulted.  Assessment & Plan:   Possible acute calculus cholecystitis Elevated LFTs -Status post EUS on 11/26/2022 which did not show any CBD stones.   -Status post endoscopic cholecystectomy on 11/27/2022.  Wound and drain care as per general surgery.  Will DC Rocephin. -Monitor LFTs: Improving.  History of sick sinus syndrome status post pacemaker -Outpatient follow-up with EP: Dual-chamber generator change out will have to be rescheduled as an outpatient.  Leukocytosis -Resolved  Hypokalemia -Replace.  Chronic kidney disease stage IIIa -Creatinine stable.  Monitor  Diabetes mellitus type 2 -continue CBGs with SSI  Hypertension --monitor blood pressure.  Continue losartan   Morbid obesity -Outpatient follow-up  Hyperlipidemia -Statin on hold  Physical deconditioning -PT eval    DVT prophylaxis: SCDs Code Status: Full Family Communication: Husband at bedside Disposition Plan: Status is: Inpatient Remains inpatient appropriate because: Of severity of illness.  For PT eval.    Consultants: General surgery/cardiology/GI  Procedures: EUS on 11/26/2022 Lap chole on 11/27/2022  Antimicrobials:  Rocephin   Subjective: Patient seen and examined at bedside.  Still feels weak and tired.  About to eat her first meal this morning.  Feels slightly short of breath with exertion.  No fever or vomiting reported. Objective: Vitals:   11/27/22 2103 11/28/22 0051 11/28/22 0453 11/28/22 0754  BP: (!) 143/72 (!) 125/59 139/77 (!) 124/58  Pulse: (!) 59 (!) 59 74 60  Resp: '18 16 18 16  '$ Temp: 97.9 F (36.6 C) 98.1 F (36.7 C) 97.6 F (36.4 C) 98.2 F (36.8 C)  TempSrc: Oral Oral Oral Oral  SpO2: 96% 98% 96% 99%  Weight:      Height:        Intake/Output Summary (Last 24 hours) at 11/28/2022 0947 Last data filed at 11/28/2022 0640 Gross per 24 hour  Intake 1711.66 ml  Output 70 ml  Net 1641.66 ml    Filed Weights   11/27/22 0713  Weight: (!) 154.2 kg    Examination:  General: No distress.  Currently on 2 L oxygen via nasal cannula. respiratory: Increased breath sounds at bases bilaterally with some basilar crackles  CVS: Mostly rate controlled; S1 and S2 are heard abdominal: Soft, morbidly obese, right upper quadrant tenderness present, right upper quadrant drain present, slightly distended, no organomegaly; bowel sounds heard extremities: No clubbing; mild lower extremity edema present  Data Reviewed: I have personally reviewed following labs and imaging studies  CBC: Recent Labs  Lab 11/23/22 0944 11/24/22 0433 11/25/22 0355 11/26/22 0250 11/27/22 0234  WBC 8.8 12.7* 8.2 5.4 10.3  NEUTROABS  --  10.1* 6.0 3.2 8.5*  HGB 15.6* 14.5 14.5 12.4 13.2  HCT 48.6* 43.8 45.7 38.1 39.9  MCV 94.6 93.6 97.0 95.0 94.5  PLT 320 306 255 234  076    Basic Metabolic Panel: Recent Labs  Lab 11/24/22 0433 11/25/22 0355 11/26/22 0250 11/27/22 0234 11/28/22 0258  NA 138 136 136 136 135  K 3.2* 3.5 3.1* 3.5 3.3*  CL 97* 99 101 100 100  CO2 '28 26 27 27 26  '$ GLUCOSE 112* 132* 103* 105* 105*  BUN '10 9 9 9 8  '$ CREATININE 1.15* 1.23* 1.10* 1.11* 0.99  CALCIUM 8.8* 8.6* 8.3* 8.6*  8.3*  MG 2.5* 2.2 2.3 2.3 2.0    GFR: Estimated Creatinine Clearance: 79.8 mL/min (by C-G formula based on SCr of 0.99 mg/dL). Liver Function Tests: Recent Labs  Lab 11/24/22 0433 11/25/22 0355 11/26/22 0250 11/27/22 0234 11/28/22 0258  AST 213* 250* 144* 116* 102*  ALT 234* 348* 242* 231* 199*  ALKPHOS 125 143* 124 140* 104  BILITOT 1.5* 5.1* 4.9* 2.0* 1.2  PROT 7.5 7.6 6.5 7.1 6.6  ALBUMIN 3.2* 3.2* 2.7* 2.9* 2.8*    Recent Labs  Lab 11/23/22 0944 11/26/22 0250  LIPASE 30 21    No results for input(s): "AMMONIA" in the last 168 hours. Coagulation Profile: Recent Labs  Lab 11/26/22 0250  INR 1.1    Cardiac Enzymes: No results for input(s): "CKTOTAL", "CKMB", "CKMBINDEX", "TROPONINI" in the last 168 hours. BNP (last 3 results) No results for input(s): "PROBNP" in the last 8760 hours. HbA1C: No results for input(s): "HGBA1C" in the last 72 hours. CBG: Recent Labs  Lab 11/27/22 2027 11/27/22 2351 11/28/22 0053 11/28/22 0520 11/28/22 0827  GLUCAP 143* 146* 122* 118* 120*    Lipid Profile: No results for input(s): "CHOL", "HDL", "LDLCALC", "TRIG", "CHOLHDL", "LDLDIRECT" in the last 72 hours. Thyroid Function Tests: No results for input(s): "TSH", "T4TOTAL", "FREET4", "T3FREE", "THYROIDAB" in the last 72 hours. Anemia Panel: No results for input(s): "VITAMINB12", "FOLATE", "FERRITIN", "TIBC", "IRON", "RETICCTPCT" in the last 72 hours. Sepsis Labs: No results for input(s): "PROCALCITON", "LATICACIDVEN" in the last 168 hours.  Recent Results (from the past 240 hour(s))  Resp panel by RT-PCR (RSV, Flu A&B, Covid) Anterior Nasal Swab     Status: None   Collection Time: 11/23/22 11:31 AM   Specimen: Anterior Nasal Swab  Result Value Ref Range Status   SARS Coronavirus 2 by RT PCR NEGATIVE NEGATIVE Final    Comment: (NOTE) SARS-CoV-2 target nucleic acids are NOT DETECTED.  The SARS-CoV-2 RNA is generally detectable in upper respiratory specimens during  the acute phase of infection. The lowest concentration of SARS-CoV-2 viral copies this assay can detect is 138 copies/mL. A negative result does not preclude SARS-Cov-2 infection and should not be used as the sole basis for treatment or other patient management decisions. A negative result may occur with  improper specimen collection/handling, submission of specimen other than nasopharyngeal swab, presence of viral mutation(s) within the areas targeted by this assay, and inadequate number of viral copies(<138 copies/mL). A negative result must be combined with clinical observations, patient history, and epidemiological information. The expected result is Negative.  Fact Sheet for Patients:  EntrepreneurPulse.com.au  Fact Sheet for Healthcare Providers:  IncredibleEmployment.be  This test is no t yet approved or cleared by the [place] FDA and  has been authorized for detection and/or diagnosis of SARS-CoV-2 by FDA under an Emergency Use Authorization (EUA). This EUA will remain  in effect (meaning this test can be used) for the duration of the COVID-19 declaration under Section 564(b)(1) of the Act, 21 U.S.C.section 360bbb-3(b)(1), unless the authorization is terminated  or revoked sooner.  Influenza A by PCR NEGATIVE NEGATIVE Final   Influenza B by PCR NEGATIVE NEGATIVE Final    Comment: (NOTE) The Xpert Xpress SARS-CoV-2/FLU/RSV plus assay is intended as an aid in the diagnosis of influenza from Nasopharyngeal swab specimens and should not be used as a sole basis for treatment. Nasal washings and aspirates are unacceptable for Xpert Xpress SARS-CoV-2/FLU/RSV testing.  Fact Sheet for Patients: EntrepreneurPulse.com.au  Fact Sheet for Healthcare Providers: IncredibleEmployment.be  This test is not yet approved or cleared by the [place] FDA and has been authorized for detection and/or  diagnosis of SARS-CoV-2 by FDA under an Emergency Use Authorization (EUA). This EUA will remain in effect (meaning this test can be used) for the duration of the COVID-19 declaration under Section 564(b)(1) of the Act, 21 U.S.C. section 360bbb-3(b)(1), unless the authorization is terminated or revoked.     Resp Syncytial Virus by PCR NEGATIVE NEGATIVE Final    Comment: (NOTE) Fact Sheet for Patients: EntrepreneurPulse.com.au  Fact Sheet for Healthcare Providers: IncredibleEmployment.be  This test is not yet approved or cleared by the [place] FDA and has been authorized for detection and/or diagnosis of SARS-CoV-2 by FDA under an Emergency Use Authorization (EUA). This EUA will remain in effect (meaning this test can be used) for the duration of the COVID-19 declaration under Section 564(b)(1) of the Act, 21 U.S.C. section 360bbb-3(b)(1), unless the authorization is terminated or revoked.  Performed at [facility], [address], [place], [place] [address]   Surgical PCR screen     Status: None   Collection Time: 11/24/22  7:36 AM   Specimen: Nasal Mucosa; Nasal Swab  Result Value Ref Range Status   MRSA, PCR NEGATIVE NEGATIVE Final   Staphylococcus aureus NEGATIVE NEGATIVE Final    Comment: (NOTE) The Xpert SA Assay (FDA approved for NASAL specimens in patients 2 years of age and older), is one component of a comprehensive surveillance program. It is not intended to diagnose infection nor to guide or monitor treatment. Performed at [facility], [place] [address], [place], [place] [address]          Radiology Studies: DG Cholangiogram Operative  Result Date: 11/27/2022 CLINICAL DATA:  Laparoscopic cholecystectomy with intraoperative cholangiogram EXAM: INTRAOPERATIVE CHOLANGIOGRAM TECHNIQUE: Cholangiographic images from the C-arm fluoroscopic device were submitted for interpretation post-operatively. Please see  the procedural report for the amount of contrast and the fluoroscopy time utilized. FLUOROSCOPY: Radiation Exposure Index (as provided by the fluoroscopic device): 25.9 mGy Kerma COMPARISON:  CT abdomen/pelvis 11/23/2022 FINDINGS: 2 cine clips are submitted for review. The images demonstrate injection of contrast material but no opacification of the biliary tree. IMPRESSION: Unsuccessful intraoperative cholangiogram. No opacification of the biliary tree. Electronically Signed   By: Jacqulynn Cadet M.D.   On: 11/27/2022 09:35        Scheduled Meds:  acetaminophen  1,000 mg Oral Q6H   diclofenac  100 mg Rectal Once   enoxaparin (LOVENOX) injection  40 mg Subcutaneous Q24H   insulin aspart  0-15 Units Subcutaneous Q4H   losartan  100 mg Oral Daily   mupirocin ointment  1 Application Nasal BID   Continuous Infusions:  cefTRIAXone (ROCEPHIN)  IV 2 g (11/28/22 1025)          Aline August, MD [facility] 11/28/2022, 9:47 AM

## 2022-11-28 NOTE — Progress Notes (Signed)
[facility] Progress Note  1 Day Post-Op  Subjective: CC:  NAEO. Tolerating liquids without N/V. Reports some abdominal pain, worse with movement, improves with rest and dilaudid. Has mobilized to the bathroom a few times with her husband. No reported urinary issues. No BM yet. Reports codeine causes hives and maybe airway compromise and she was told she cannot take muscle relaxers because of her cardiac history/antiplatelets.   Objective: Vital signs in last 24 hours: Temp:  [97.5 F (36.4 C)-98.4 F (36.9 C)] 98.2 F (36.8 C) (01/15 0754) Pulse Rate:  [59-74] 60 (01/15 0754) Resp:  [16-22] 16 (01/15 0754) BP: (92-158)/(57-84) 124/58 (01/15 0754) SpO2:  [91 %-99 %] 99 % (01/15 0754) Last BM Date : 11/23/22  Intake/Output from previous day: 01/14 0701 - 01/15 0700 In: 2611.7 [P.O.:680; I.V.:1831.7; IV Piggyback:100] Out: 170 [Drains:70; Blood:100] Intake/Output this shift: No intake/output data recorded.  PE: Gen:  Alert, NAD, pleasant Card:  Regular rate and rhythm Pulm:  Normal effort on [place] Abd: Soft, obese, incisions c/d/I, umbilical hernia soft, RUQ blake drain with SS drainage, no bile. Skin: warm and dry, no rashes  Psych: A&Ox3   Lab Results:  Recent Labs    11/26/22 0250 11/27/22 0234  WBC 5.4 10.3  HGB 12.4 13.2  HCT 38.1 39.9  PLT 234 248   BMET Recent Labs    11/27/22 0234 11/28/22 0258  NA 136 135  K 3.5 3.3*  CL 100 100  CO2 27 26  GLUCOSE 105* 105*  BUN 9 8  CREATININE 1.11* 0.99  CALCIUM 8.6* 8.3*   PT/INR Recent Labs    11/26/22 0250  LABPROT 14.3  INR 1.1   CMP     Component Value Date/Time   NA 135 11/28/2022 0258   NA 139 07/25/2022 1006   NA 137 07/12/2014 2227   K 3.3 (L) 11/28/2022 0258   K 3.4 (L) 07/12/2014 2227   CL 100 11/28/2022 0258   CL 99 07/12/2014 2227   CO2 26 11/28/2022 0258   CO2 28 07/12/2014 2227   GLUCOSE 105 (H) 11/28/2022 0258   GLUCOSE 102 (H) 07/12/2014 2227   BUN 8 11/28/2022 0258    BUN 15 07/25/2022 1006   BUN 15 07/12/2014 2227   CREATININE 0.99 11/28/2022 0258   CREATININE 1.37 (H) 07/12/2014 2227   CALCIUM 8.3 (L) 11/28/2022 0258   CALCIUM 9.0 07/12/2014 2227   PROT 6.6 11/28/2022 0258   PROT 7.6 07/25/2022 1006   PROT 8.4 (H) 07/12/2014 2227   ALBUMIN 2.8 (L) 11/28/2022 0258   ALBUMIN 4.0 07/25/2022 1006   ALBUMIN 3.4 07/12/2014 2227   AST 102 (H) 11/28/2022 0258   AST 45 (H) 05/23/2017 0933   AST 28 07/12/2014 2227   ALT 199 (H) 11/28/2022 0258   ALT 23 05/23/2017 0933   ALT 38 07/12/2014 2227   ALKPHOS 104 11/28/2022 0258   ALKPHOS 78 07/12/2014 2227   BILITOT 1.2 11/28/2022 0258   BILITOT 0.8 07/25/2022 1006   BILITOT 0.3 07/12/2014 2227   GFRNONAA >60 11/28/2022 0258   GFRNONAA 42 (L) 07/12/2014 2227   GFRAA 59 (L) 12/29/2020 1101   GFRAA 49 (L) 07/12/2014 2227   Lipase     Component Value Date/Time   LIPASE 21 11/26/2022 0250   LIPASE 374 07/12/2014 2227       Studies/Results: DG Cholangiogram Operative  Result Date: 11/27/2022 CLINICAL DATA:  Laparoscopic cholecystectomy with intraoperative cholangiogram EXAM: INTRAOPERATIVE CHOLANGIOGRAM TECHNIQUE: Cholangiographic images from the C-arm fluoroscopic  device were submitted for interpretation post-operatively. Please see the procedural report for the amount of contrast and the fluoroscopy time utilized. FLUOROSCOPY: Radiation Exposure Index (as provided by the fluoroscopic device): 25.9 mGy Kerma COMPARISON:  CT abdomen/pelvis 11/23/2022 FINDINGS: 2 cine clips are submitted for review. The images demonstrate injection of contrast material but no opacification of the biliary tree. IMPRESSION: Unsuccessful intraoperative cholangiogram. No opacification of the biliary tree. Electronically Signed   By: Jacqulynn Cadet M.D.   On: 11/27/2022 09:35    Anti-infectives: Anti-infectives (From admission, onward)    Start     Dose/Rate Route Frequency Ordered Stop   11/27/22 0705  sodium chloride  0.9 % with cefTRIAXone (ROCEPHIN) ADS Med       Note to Pharmacy: Grace Blight M: cabinet override      11/27/22 0705 11/27/22 0811   11/24/22 0800  cefTRIAXone (ROCEPHIN) 2 g in sodium chloride 0.9 % 100 mL IVPB        2 g 200 mL/hr over 30 Minutes Intravenous Every 24 hours 11/24/22 0743           Latest Ref Rng & Units 11/28/2022    2:58 AM 11/27/2022    2:34 AM 11/26/2022    2:50 AM  Hepatic Function  Total Protein 6.5 - 8.1 g/dL 6.6  7.1  6.5   Albumin 3.5 - 5.0 g/dL 2.8  2.9  2.7   AST 15 - 41 U/L 102  116  144   ALT 0 - 44 U/L 199  231  242   Alk Phosphatase 38 - 126 U/L 104  140  124   Total Bilirubin 0.3 - 1.2 mg/dL 1.2  2.0  4.9      Assessment/Plan  Calculous cholecystitis S/p EUS 1/13 w/ cholelithiasis without CBD stones.  S/p laparoscopic cholecystectomy, placement blake drain 1/14 Dr. Rosendo Gros - AFVSS, LFTs downtrending as above (bili 1.2 from 2.0) - try scheduled tylenol and PRN tramadol for pain control, pt only taking dilaudid currently - tolerating PO, eating a solid breakfast.  - stable for discharge from a surgical perspective. Leave drain in place, RN to perform drain teaching. Follow up in our office for drain removal in 1 week and with a provider in 2-3 weeks.     LOS: 4 days   I reviewed nursing notes, hospitalist notes, last 24 h vitals and pain scores, last 48 h intake and output, last 24 h labs and trends, and last 24 h imaging results.    Obie Dredge, PA-C [facility] Please see Amion for pager number during day hours 7:00am-4:30pm

## 2022-11-29 DIAGNOSIS — R7989 Other specified abnormal findings of blood chemistry: Secondary | ICD-10-CM | POA: Diagnosis not present

## 2022-11-29 LAB — CBC WITH DIFFERENTIAL/PLATELET
Abs Immature Granulocytes: 0.04 10*3/uL (ref 0.00–0.07)
Basophils Absolute: 0 10*3/uL (ref 0.0–0.1)
Basophils Relative: 1 %
Eosinophils Absolute: 0.3 10*3/uL (ref 0.0–0.5)
Eosinophils Relative: 4 %
HCT: 36.4 % (ref 36.0–46.0)
Hemoglobin: 11.6 g/dL — ABNORMAL LOW (ref 12.0–15.0)
Immature Granulocytes: 1 %
Lymphocytes Relative: 15 %
Lymphs Abs: 1.2 10*3/uL (ref 0.7–4.0)
MCH: 30.9 pg (ref 26.0–34.0)
MCHC: 31.9 g/dL (ref 30.0–36.0)
MCV: 97.1 fL (ref 80.0–100.0)
Monocytes Absolute: 1.1 10*3/uL — ABNORMAL HIGH (ref 0.1–1.0)
Monocytes Relative: 14 %
Neutro Abs: 5.2 10*3/uL (ref 1.7–7.7)
Neutrophils Relative %: 65 %
Platelets: 216 10*3/uL (ref 150–400)
RBC: 3.75 MIL/uL — ABNORMAL LOW (ref 3.87–5.11)
RDW: 14.3 % (ref 11.5–15.5)
WBC: 7.8 10*3/uL (ref 4.0–10.5)
nRBC: 0 % (ref 0.0–0.2)

## 2022-11-29 LAB — GLUCOSE, CAPILLARY
Glucose-Capillary: 154 mg/dL — ABNORMAL HIGH (ref 70–99)
Glucose-Capillary: 118 mg/dL — ABNORMAL HIGH (ref 70–99)
Glucose-Capillary: 148 mg/dL — ABNORMAL HIGH (ref 70–99)
Glucose-Capillary: 106 mg/dL — ABNORMAL HIGH (ref 70–99)
Glucose-Capillary: 148 mg/dL — ABNORMAL HIGH (ref 70–99)

## 2022-11-29 LAB — COMPREHENSIVE METABOLIC PANEL
ALT: 139 U/L — ABNORMAL HIGH (ref 0–44)
AST: 56 U/L — ABNORMAL HIGH (ref 15–41)
Albumin: 2.6 g/dL — ABNORMAL LOW (ref 3.5–5.0)
Alkaline Phosphatase: 106 U/L (ref 38–126)
Anion gap: 6 (ref 5–15)
BUN: 13 mg/dL (ref 8–23)
CO2: 30 mmol/L (ref 22–32)
Calcium: 8.4 mg/dL — ABNORMAL LOW (ref 8.9–10.3)
Chloride: 100 mmol/L (ref 98–111)
Creatinine, Ser: 1.17 mg/dL — ABNORMAL HIGH (ref 0.44–1.00)
GFR, Estimated: 51 mL/min — ABNORMAL LOW (ref >60)
Glucose, Bld: 135 mg/dL — ABNORMAL HIGH (ref 70–99)
Potassium: 3.1 mmol/L — ABNORMAL LOW (ref 3.5–5.1)
Sodium: 136 mmol/L (ref 135–145)
Total Bilirubin: 1.1 mg/dL (ref 0.3–1.2)
Total Protein: 6 g/dL — ABNORMAL LOW (ref 6.5–8.1)

## 2022-11-29 LAB — MAGNESIUM: Magnesium: 1.8 mg/dL (ref 1.7–2.4)

## 2022-11-29 MED ORDER — FUROSEMIDE 10 MG/ML IJ SOLN
40.0000 mg | Freq: Once | INTRAMUSCULAR | Status: AC
  Administered 2022-11-29: 40 mg via INTRAVENOUS
  Filled 2022-11-29: qty 4

## 2022-11-29 MED ORDER — TRAMADOL HCL 50 MG PO TABS
100.0000 mg | ORAL_TABLET | Freq: Four times a day (QID) | ORAL | Status: DC | PRN
  Administered 2022-11-29 – 2022-11-30 (×2): 100 mg via ORAL
  Filled 2022-11-29 (×2): qty 2

## 2022-11-29 MED ORDER — ACETAMINOPHEN 500 MG PO TABS: 1000.0000 mg | ORAL_TABLET | Freq: Four times a day (QID) | ORAL | 0 refills | Status: AC | PRN

## 2022-11-29 MED ORDER — TRAMADOL HCL 50 MG PO TABS: 50.0000 mg | ORAL_TABLET | Freq: Four times a day (QID) | ORAL | 0 refills | Status: DC | PRN

## 2022-11-29 MED ORDER — POTASSIUM CHLORIDE CRYS ER 20 MEQ PO TBCR
40.0000 meq | EXTENDED_RELEASE_TABLET | ORAL | Status: AC
  Administered 2022-11-29 (×2): 40 meq via ORAL
  Filled 2022-11-29 (×2): qty 2

## 2022-11-29 NOTE — Progress Notes (Signed)
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  On presentation, she was found to have elevated LFTs.  CT of abdomen and pelvis with contrast showed mild pericholecystic stranding, right upper quadrant ultrasound showed sludge and small mobile stones in the gallbladder.  MRCP could not be done because of patient having pacemaker.  She was transferred to [facility] for further general surgical evaluation.  She was started on antibiotics.  General surgery and subsequently cardiology were consulted.  Assessment & Plan:   Possible acute calculus cholecystitis Elevated LFTs -Status post EUS on 11/26/2022 which did not show any CBD stones.   -Status post endoscopic cholecystectomy on 11/27/2022.  Wound and drain care as per general surgery.  Rocephin discontinued on 11/28/2022. -Monitor LFTs: Improving.  Volume overload -Patient continues to require supplemental oxygen via nasal cannula at 2 L/min.  Received a dose of IV Lasix on 11/28/2022 with good urine output.  Will give another dose of IV Lasix 40 mg today.  Will check ambulatory oxygen requirement. -Continue ambulation and incentive spirometry  History of sick sinus syndrome status post pacemaker -Outpatient follow-up with EP: Dual-chamber generator change out will have to be rescheduled as an outpatient.  Leukocytosis -Resolved  Hypokalemia -Replace.  Repeat a.m. labs.  Chronic kidney disease stage IIIa -Creatinine stable.  Monitor  Diabetes mellitus type 2 -continue CBGs with SSI  Hypertension --monitor blood pressure.  Continue losartan   Morbid obesity -Outpatient follow-up  Hyperlipidemia -Statin on hold  Physical  deconditioning -PT eval pending.  Patient apparently was slightly dizzy yesterday with ambulation.   DVT prophylaxis: SCDs Code Status: Full Family Communication: Husband at bedside Disposition Plan: Status is: Inpatient Remains inpatient appropriate because: Of severity of illness.  PT eval pending.   Consultants: General surgery/cardiology/GI  Procedures: EUS on 11/26/2022 Lap chole on 11/27/2022  Antimicrobials: Rocephin   Subjective: Patient seen and examined at bedside.  Feels slightly better.  Still short of breath with slight exertion and requiring supplemental oxygen.  Has not worked with PT much.  Tolerating diet, no fever or vomiting reported.   Objective: Vitals:   11/28/22 2107 11/29/22 0354 11/29/22 0500 11/29/22 0905  BP: 139/68 (!) 125/52  119/63  Pulse: 64 61  61  Resp:    17  Temp: 98.4 F (36.9 C) 98.4 F (36.9 C)  97.7 F (36.5 C)  TempSrc: Oral   Oral  SpO2: 100% 100%  100%  Weight:   (!) 169.4 kg   Height:        Intake/Output Summary (Last 24 hours) at 11/29/2022 1047 Last data filed at 11/29/2022 0600 Gross per 24 hour  Intake --  Output 30 ml  Net -30 ml    Filed Weights   11/27/22 0713 11/29/22 0500  Weight: (!) 154.2 kg (!) 169.4 kg    Examination:  General: Still on 2 L oxygen via nasal cannula.  No distress. respiratory: Bilateral decreased breath sounds at bases with scattered crackles CVS: S1-S2 heard; rate controlled currently abdominal: Soft, morbidly obese, nontender; right upper quadrant drain present, still tender; no organomegaly; normal bowel sounds heard  extremities: Trace lower extremity edema present; no cyanosis  Data Reviewed: I have personally reviewed following labs and imaging studies  CBC: Recent Labs  Lab 11/24/22 0433 11/25/22 0355 11/26/22 0250 11/27/22 0234 11/29/22 0313  WBC 12.7* 8.2 5.4 10.3 7.8  NEUTROABS 10.1* 6.0 3.2 8.5* 5.2  HGB 14.5 14.5 12.4 13.2 11.6*  HCT 43.8 45.7 38.1 39.9 36.4  MCV  93.6 97.0 95.0 94.5 97.1  PLT 306 255 234 248 300    Basic Metabolic Panel: Recent Labs  Lab 11/25/22 0355 11/26/22 0250 11/27/22 0234 11/28/22 0258 11/29/22 0313  NA 136 136 136 135 136  K 3.5 3.1* 3.5 3.3* 3.1*  CL 99 101 100 100 100  CO2 '26 27 27 26 30  '$ GLUCOSE 132* 103* 105* 105* 135*  BUN '9 9 9 8 13  '$ CREATININE 1.23* 1.10* 1.11* 0.99 1.17*  CALCIUM 8.6* 8.3* 8.6* 8.3* 8.4*  MG 2.2 2.3 2.3 2.0 1.8    GFR: Estimated Creatinine Clearance: 72 mL/min (A) (by C-G formula based on SCr of 1.17 mg/dL (H)). Liver Function Tests: Recent Labs  Lab 11/25/22 0355 11/26/22 0250 11/27/22 0234 11/28/22 0258 11/29/22 0313  AST 250* 144* 116* 102* 56*  ALT 348* 242* 231* 199* 139*  ALKPHOS 143* 124 140* 104 106  BILITOT 5.1* 4.9* 2.0* 1.2 1.1  PROT 7.6 6.5 7.1 6.6 6.0*  ALBUMIN 3.2* 2.7* 2.9* 2.8* 2.6*    Recent Labs  Lab 11/23/22 0944 11/26/22 0250  LIPASE 30 21    No results for input(s): "AMMONIA" in the last 168 hours. Coagulation Profile: Recent Labs  Lab 11/26/22 0250  INR 1.1    Cardiac Enzymes: No results for input(s): "CKTOTAL", "CKMB", "CKMBINDEX", "TROPONINI" in the last 168 hours. BNP (last 3 results) No results for input(s): "PROBNP" in the last 8760 hours. HbA1C: No results for input(s): "HGBA1C" in the last 72 hours. CBG: Recent Labs  Lab 11/28/22 1726 11/28/22 2108 11/28/22 2343 11/29/22 0352 11/29/22 0906  GLUCAP 157* 110* 113* 154* 118*    Lipid Profile: No results for input(s): "CHOL", "HDL", "LDLCALC", "TRIG", "CHOLHDL", "LDLDIRECT" in the last 72 hours. Thyroid Function Tests: No results for input(s): "TSH", "T4TOTAL", "FREET4", "T3FREE", "THYROIDAB" in the last 72 hours. Anemia Panel: No results for input(s): "VITAMINB12", "FOLATE", "FERRITIN", "TIBC", "IRON", "RETICCTPCT" in the last 72 hours. Sepsis Labs: No results for input(s): "PROCALCITON", "LATICACIDVEN" in the last 168 hours.  Recent Results (from the past 240 hour(s))   Resp panel by RT-PCR (RSV, Flu A&B, Covid) Anterior Nasal Swab     Status: None   Collection Time: 11/23/22 11:31 AM   Specimen: Anterior Nasal Swab  Result Value Ref Range Status   SARS Coronavirus 2 by RT PCR NEGATIVE NEGATIVE Final    Comment: (NOTE) SARS-CoV-2 target nucleic acids are NOT DETECTED.  The SARS-CoV-2 RNA is generally detectable in upper respiratory specimens during the acute phase of infection. The lowest concentration of SARS-CoV-2 viral copies this assay can detect is 138 copies/mL. A negative result does not preclude SARS-Cov-2 infection and should not be used as the sole basis for treatment or other patient management decisions. A negative result may occur with  improper specimen collection/handling, submission of specimen other than nasopharyngeal swab, presence of viral mutation(s) within the areas targeted by this assay, and inadequate number of viral copies(<138 copies/mL). A negative result must be combined with clinical observations, patient history, and epidemiological information. The expected result is Negative.  Fact Sheet for Patients:  EntrepreneurPulse.com.au  Fact Sheet for Healthcare Providers:  IncredibleEmployment.be  This test is no t yet approved or cleared by the [place] and  has been authorized for detection and/or diagnosis of SARS-CoV-2 by FDA under an Emergency Use Authorization (EUA). This EUA will remain  in effect (meaning this test can be used) for the duration of the COVID-19 declaration under Section 564(b)(1) of the Act, 21 U.S.C.section 360bbb-3(b)(1), unless the authorization is terminated  or revoked sooner.       Influenza A by PCR NEGATIVE NEGATIVE Final   Influenza B by PCR NEGATIVE NEGATIVE Final    Comment: (NOTE) The Xpert Xpress SARS-CoV-2/FLU/RSV plus assay is intended as an aid in the diagnosis of influenza from Nasopharyngeal swab specimens and should not be used  as a sole basis for treatment. Nasal washings and aspirates are unacceptable for Xpert Xpress SARS-CoV-2/FLU/RSV testing.  Fact Sheet for Patients: EntrepreneurPulse.com.au  Fact Sheet for Healthcare Providers: IncredibleEmployment.be  This test is not yet approved or cleared by the [place] FDA and has been authorized for detection and/or diagnosis of SARS-CoV-2 by FDA under an Emergency Use Authorization (EUA). This EUA will remain in effect (meaning this test can be used) for the duration of the COVID-19 declaration under Section 564(b)(1) of the Act, 21 U.S.C. section 360bbb-3(b)(1), unless the authorization is terminated or revoked.     Resp Syncytial Virus by PCR NEGATIVE NEGATIVE Final    Comment: (NOTE) Fact Sheet for Patients: EntrepreneurPulse.com.au  Fact Sheet for Healthcare Providers: IncredibleEmployment.be  This test is not yet approved or cleared by the [place] FDA and has been authorized for detection and/or diagnosis of SARS-CoV-2 by FDA under an Emergency Use Authorization (EUA). This EUA will remain in effect (meaning this test can be used) for the duration of the COVID-19 declaration under Section 564(b)(1) of the Act, 21 U.S.C. section 360bbb-3(b)(1), unless the authorization is terminated or revoked.  Performed at [facility], [address], [place], [place] [address]   Surgical PCR screen     Status: None   Collection Time: 11/24/22  7:36 AM   Specimen: Nasal Mucosa; Nasal Swab  Result Value Ref Range Status   MRSA, PCR NEGATIVE NEGATIVE Final   Staphylococcus aureus NEGATIVE NEGATIVE Final    Comment: (NOTE) The Xpert SA Assay (FDA approved for NASAL specimens in patients 28 years of age and older), is one component of a comprehensive surveillance program. It is not intended to diagnose infection nor to guide or monitor treatment. Performed at [facility], [place] [address], [place], [place] [address]          Radiology Studies: No results found.      Scheduled Meds:  acetaminophen  1,000 mg Oral Q6H   diclofenac  100 mg Rectal Once   enoxaparin (LOVENOX) injection  0.5 mg/kg Subcutaneous Q24H   furosemide  40 mg Intravenous Once   insulin aspart  0-15 Units Subcutaneous Q4H   losartan  100 mg Oral Daily   potassium chloride  40 mEq Oral Q4H   Continuous Infusions:          Aline August, MD [facility] 11/29/2022, 10:47 AM

## 2022-11-29 NOTE — Progress Notes (Signed)
[facility] Progress Note  2 Days Post-Op  Subjective: CC:  NAEO. Mobilized in hallway with walker yesterday, slightly limited by SOB. Pain slightly improved with tylenol and tramadol. +flatus. No BM yet.  Objective: Vital signs in last 24 hours: Temp:  [98.4 F (36.9 C)-98.8 F (37.1 C)] 98.4 F (36.9 C) (01/16 0354) Pulse Rate:  [61-65] 61 (01/16 0354) Resp:  [16] 16 (01/15 1609) BP: (120-139)/(52-68) 125/52 (01/16 0354) SpO2:  [97 %-100 %] 100 % (01/16 0354) Weight:  [169.4 kg] 169.4 kg (01/16 0500) Last BM Date : 11/23/22  Intake/Output from previous day: 01/15 0701 - 01/16 0700 In: -  Out: 30 [Drains:30] Intake/Output this shift: No intake/output data recorded.  PE: Gen:  Alert, NAD, pleasant Card:  Regular rate and rhythm Pulm:  Normal effort on [place] Abd: Soft, obese, incisions c/d/I, umbilical hernia soft, RUQ blake drain with SS drainage, no bile. Skin: warm and dry, no rashes  Psych: A&Ox3   Lab Results:  Recent Labs    11/27/22 0234 11/29/22 0313  WBC 10.3 7.8  HGB 13.2 11.6*  HCT 39.9 36.4  PLT 248 216   BMET Recent Labs    11/28/22 0258 11/29/22 0313  NA 135 136  K 3.3* 3.1*  CL 100 100  CO2 26 30  GLUCOSE 105* 135*  BUN 8 13  CREATININE 0.99 1.17*  CALCIUM 8.3* 8.4*   PT/INR No results for input(s): "LABPROT", "INR" in the last 72 hours.  CMP     Component Value Date/Time   NA 136 11/29/2022 0313   NA 139 07/25/2022 1006   NA 137 07/12/2014 2227   K 3.1 (L) 11/29/2022 0313   K 3.4 (L) 07/12/2014 2227   CL 100 11/29/2022 0313   CL 99 07/12/2014 2227   CO2 30 11/29/2022 0313   CO2 28 07/12/2014 2227   GLUCOSE 135 (H) 11/29/2022 0313   GLUCOSE 102 (H) 07/12/2014 2227   BUN 13 11/29/2022 0313   BUN 15 07/25/2022 1006   BUN 15 07/12/2014 2227   CREATININE 1.17 (H) 11/29/2022 0313   CREATININE 1.37 (H) 07/12/2014 2227   CALCIUM 8.4 (L) 11/29/2022 0313   CALCIUM 9.0 07/12/2014 2227   PROT 6.0 (L) 11/29/2022 0313    PROT 7.6 07/25/2022 1006   PROT 8.4 (H) 07/12/2014 2227   ALBUMIN 2.6 (L) 11/29/2022 0313   ALBUMIN 4.0 07/25/2022 1006   ALBUMIN 3.4 07/12/2014 2227   AST 56 (H) 11/29/2022 0313   AST 45 (H) 05/23/2017 0933   AST 28 07/12/2014 2227   ALT 139 (H) 11/29/2022 0313   ALT 23 05/23/2017 0933   ALT 38 07/12/2014 2227   ALKPHOS 106 11/29/2022 0313   ALKPHOS 78 07/12/2014 2227   BILITOT 1.1 11/29/2022 0313   BILITOT 0.8 07/25/2022 1006   BILITOT 0.3 07/12/2014 2227   GFRNONAA 51 (L) 11/29/2022 0313   GFRNONAA 42 (L) 07/12/2014 2227   GFRAA 59 (L) 12/29/2020 1101   GFRAA 49 (L) 07/12/2014 2227   Lipase     Component Value Date/Time   LIPASE 21 11/26/2022 0250   LIPASE 374 07/12/2014 2227       Studies/Results: DG Cholangiogram Operative  Result Date: 11/27/2022 CLINICAL DATA:  Laparoscopic cholecystectomy with intraoperative cholangiogram EXAM: INTRAOPERATIVE CHOLANGIOGRAM TECHNIQUE: Cholangiographic images from the C-arm fluoroscopic device were submitted for interpretation post-operatively. Please see the procedural report for the amount of contrast and the fluoroscopy time utilized. FLUOROSCOPY: Radiation Exposure Index (as provided by the fluoroscopic device): 25.9  mGy Kerma COMPARISON:  CT abdomen/pelvis 11/23/2022 FINDINGS: 2 cine clips are submitted for review. The images demonstrate injection of contrast material but no opacification of the biliary tree. IMPRESSION: Unsuccessful intraoperative cholangiogram. No opacification of the biliary tree. Electronically Signed   By: Jacqulynn Cadet M.D.   On: 11/27/2022 09:35    Anti-infectives: Anti-infectives (From admission, onward)    Start     Dose/Rate Route Frequency Ordered Stop   11/27/22 0705  sodium chloride 0.9 % with cefTRIAXone (ROCEPHIN) ADS Med       Note to Pharmacy: Grace Blight M: cabinet override      11/27/22 0705 11/27/22 0811   11/24/22 0800  cefTRIAXone (ROCEPHIN) 2 g in sodium chloride 0.9 % 100 mL IVPB   Status:  Discontinued        2 g 200 mL/hr over 30 Minutes Intravenous Every 24 hours 11/24/22 0743 11/28/22 0951         Latest Ref Rng & Units 11/29/2022    3:13 AM 11/28/2022    2:58 AM 11/27/2022    2:34 AM  Hepatic Function  Total Protein 6.5 - 8.1 g/dL 6.0  6.6  7.1   Albumin 3.5 - 5.0 g/dL 2.6  2.8  2.9   AST 15 - 41 U/L 56  102  116   ALT 0 - 44 U/L 139  199  231   Alk Phosphatase 38 - 126 U/L 106  104  140   Total Bilirubin 0.3 - 1.2 mg/dL 1.1  1.2  2.0      Assessment/Plan  Calculous cholecystitis S/p EUS 1/13 w/ cholelithiasis without CBD stones.  S/p laparoscopic cholecystectomy, placement blake drain 1/14 Dr. Rosendo Gros - AFVSS, LFTs downtrending as above - try scheduled tylenol and PRN tramadol for pain control - continue JP drain. - tolerating PO, eating a solid breakfast.  - stable for discharge from a surgical perspective. Leave drain in place. Follow up in our office for drain removal in 1 week and with a provider in 2-3 weeks. Pain Rx provided.    LOS: 5 days   I reviewed nursing notes, hospitalist notes, last 24 h vitals and pain scores, last 48 h intake and output, last 24 h labs and trends, and last 24 h imaging results.    Obie Dredge, PA-C [facility] Please see Amion for pager number during day hours 7:00am-4:30pm

## 2022-11-29 NOTE — TOC Initial Note (Signed)
Transition of Care (TOC) - Initial/Assessment Note   Spoke to patient at bedside. Confirmed face sheet information.   At discharge patient will stay with her mother at [place] , [facility]   Patient agreeable to home health , no preference. Mary Webb with Mary Webb accepted referral.   PT recommending bariatric rolling walker , Patient in agreement same ordered with Mary Webb with [facility].   Continue to follow for possible home oxygen needs. Explained to patient if home oxygen needed portable oxygen would be delivered to her room prior to discharge . Home Oxygen equipment would be delivered directly to home   Patient voiced understanding   Patient Details  Name: Mary Webb MRN: [id number] Date of Birth: 08-Jun-1956  Transition of Care [facility]) CM/SW Contact:    Mary Favre, RN Phone Number: 11/29/2022, 4:00 PM  Clinical Narrative:                   Expected Discharge Plan: [place] Barriers to Discharge: Continued Medical Work up   Patient Goals and CMS Choice Patient states their goals for this hospitalization and ongoing recovery are:: to return to home CMS Medicare.gov Compare Post Acute Care list provided to:: Patient Choice offered to / list presented to : Patient      Expected Discharge Plan and Services   Discharge Planning Services: CM Consult Post Acute Care Choice: [place] arrangements for the past 2 months: Single Family Home                 DME Arranged: Walker rolling (bari walker) DME Agency: [employer] Date DME Agency Contacted: 11/29/22 Time DME Agency Contacted: [phone] Representative spoke with at DME Agency: Mary Webb HH Arranged: PT [place]: [place] Date [place]: 11/29/22 Time [place]: 1540 Representative spoke with at [place]  Prior Living Arrangements/Services Living arrangements for the past 2 months: [place] Lives with:: Self Patient  language and need for interpreter reviewed:: Yes Do you feel safe going back to the place where you live?: Yes      Need for Family Participation in Patient Care: Yes (Comment) Care giver support system in place?: Yes (comment)   Criminal Activity/Legal Involvement Pertinent to Current Situation/Hospitalization: No - Comment as needed  Activities of Daily Living      Permission Sought/Granted   Permission granted to share information with : No              Emotional Assessment Appearance:: Appears stated age Attitude/Demeanor/Rapport: Engaged Affect (typically observed): Accepting Orientation: : Oriented to Self, Oriented to Place, Oriented to  Time, Oriented to Situation Alcohol / Substance Use: Not Applicable Psych Involvement: No (comment)  Admission diagnosis:  Elevated LFTs [R79.89] Patient Active Problem List   Diagnosis Date Noted   Elevated LFTs 11/24/2022   Abdominal pain 11/24/2022   History of COVID-19 09/15/2022   Nausea and vomiting 09/15/2022   Hypertension 12/29/2020   Pacemaker 12/29/2020   Prediabetes 12/28/2020   Advanced care planning/counseling discussion 11/01/2017   Sick sinus syndrome ([place]) 04/28/2015   Hypertensive kidney disease with CKD stage III ([place]) 04/28/2015   Pituitary adenoma ([place]) 04/28/2015   Morbid obesity ([place]) 04/28/2015   Hyperlipidemia 04/28/2015   History of stroke 04/28/2015   PCP:  Mary Billings, NP Pharmacy:   [facility], [place] [address] Phone:  [phone] Fax: [phone]     Social Determinants of Health (SDOH) Social History: SDOH Screenings   Food Insecurity: No Food Insecurity (01/24/2022)  Housing: Low Risk  (01/24/2022)  Transportation Needs: No Transportation Needs (01/24/2022)  Alcohol Screen: Low Risk  (01/24/2022)  Depression (PHQ2-9): Low Risk  (07/25/2022)  Financial Resource Strain: Low Risk  (01/24/2022)  Physical Activity: Inactive  (01/24/2022)  Social Connections: Moderately Isolated (01/24/2022)  Stress: No Stress Concern Present (01/24/2022)  Tobacco Use: Low Risk  (11/28/2022)   SDOH Interventions:     Readmission Risk Interventions     No data to display

## 2022-11-29 NOTE — Evaluation (Signed)
Occupational Therapy Evaluation Patient Details Name: Mary Webb MRN: [id number] DOB: [birth year] Today's Date: 11/29/2022   History of Present Illness Pt is a 67 y/o female who presents to [facility] on 11/23/22 with cholecystitis. She was transferred to [facility] and is now s/p cholecystectomy on 11/27/22. PMH significant for CAD, CKD, CVA, PPM placement 2015, lymphadenopathy, pituitary adenoma, DOE, SSS.   Clinical Impression   Pt in bed upon therapy arrival with husband and sister present in room visiting. Pt reports receiving increased assistance from her husband for the past 2 years to complete BADL tasks with increased assistance required when sciatica pain began. 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 Now she is presenting with decreased activity tolerance and endurance which requires increased physical assist to complete BADL tasks and functional transfers. Pt/family education provided on energy conservation techniques and strategies while discussing ways to implement them at home. Pt and husband verbalized understanding. Handout provided for reference. Recommend HHOT when discharged to further focus on activity tolerance and endurance and to provide additional recommendations on strategies to increase functional performance.       Recommendations for follow up therapy are one component of a multi-disciplinary discharge planning process, led by the attending physician.  Recommendations may be updated based on patient status, additional functional criteria and insurance authorization.   Follow Up Recommendations  Home health OT     Assistance Recommended at Discharge PRN  Patient can return home with the following A little help with walking and/or transfers;A little help with bathing/dressing/bathroom;Help with stairs or ramp for entrance;Assist for transportation;Assistance with cooking/housework     Functional Status Assessment  Patient has had a recent decline in their functional status and demonstrates the ability to make significant improvements in function in a reasonable and predictable amount of time.  Equipment Recommendations  Tub/shower bench       Precautions / Restrictions Precautions Precautions: Fall Precaution Comments: RUQ drain Restrictions Weight Bearing Restrictions: No      Mobility Bed Mobility Overal bed mobility: Needs Assistance Bed Mobility: Supine to Sit, Sit to Supine     Supine to sit: Min assist, HOB elevated Sit to supine: Min assist   General bed mobility comments: Pt reaching for assist to pull trunk into flexion, and required assist to elevate LE's up into bed at end of session. Body habitus limiting technique but did not complain of pain throughout transfers. Patient Response: Cooperative  Transfers Overall transfer level: Needs assistance Equipment used: Rolling walker (2 wheels) Transfers: Sit to/from Stand Sit to Stand: Supervision, From elevated surface           General transfer comment: VC for proper hand placement provided for sit to stand technique with education provided on rational.      Balance Overall balance assessment: No apparent balance deficits (not formally assessed)             ADL either performed or assessed with clinical judgement   ADL Overall ADL's : At baseline     General ADL Comments: Pt is able to complete UB bathing/dressing/grooming with Set-up while sitting. Toileting requires total assist (baseline) with husband assisting. LB bathing/dressing is completed with min-mod assist. Pt is able to complete toilet transfer with SBA using RW.     Vision Baseline Vision/History: 0 No visual deficits Ability to See in Adequate Light: 0 Adequate Patient Visual Report: No change from baseline  Vision Assessment?: No apparent visual deficits            Pertinent Vitals/Pain Pain Assessment Pain  Assessment: No/denies pain Faces Pain Scale: No hurt     Hand Dominance Right   Extremity/Trunk Assessment Upper Extremity Assessment Upper Extremity Assessment: Overall WFL for tasks assessed   Lower Extremity Assessment Lower Extremity Assessment: Defer to PT evaluation   Cervical / Trunk Assessment Cervical / Trunk Assessment: Normal;Other exceptions Cervical / Trunk Exceptions: Increased body habitus   Communication Communication Communication: No difficulties   Cognition Arousal/Alertness: Awake/alert Behavior During Therapy: WFL for tasks assessed/performed Overall Cognitive Status: Within Functional Limits for tasks assessed                          Home Living Family/patient expects to be discharged to:: Private residence Living Arrangements: Spouse/significant other Available Help at Discharge: Family;Available 24 hours/day (husband and sister available) Type of Home: House Home Access: Level entry     Home Layout: One level     Bathroom Shower/Tub: Sponge bathes at baseline   Bathroom Toilet: Handicapped height Bathroom Accessibility: No   Home Equipment: BSC/3in1;Cane - quad;Rollator (4 wheels)          Prior Functioning/Environment Prior Level of Function : Needs assist             Mobility Comments: Uses Quad cane mostly but uses rollator when she goes out. cannot use in the house because it will not fit through home/in bathroom. ADLs Comments: Sponge bathing for about 2 years, husband assists with washing back and feet, uses BSC since sciatic pain started (november 2023), husband assists with cooking/cleaning. Husband assists with toileting and will don shoes. If he starts pants/underwear, pt is able to pull up over hips        OT Problem List: Decreased strength;Decreased activity tolerance      OT Treatment/Interventions:   Patient/family education, therapeutic activities    OT Goals(Current goals can be found in the care plan  section) Acute Rehab OT Goals Patient Stated Goal: to get stronger OT Goal Formulation: All assessment and education complete, DC therapy Time For Goal Achievement: 11/29/22 Potential to Achieve Goals: Good  OT Frequency:  1 time visit       AM-PAC OT "6 Clicks" Daily Activity     Outcome Measure Help from another person eating meals?: None Help from another person taking care of personal grooming?: None Help from another person toileting, which includes using toliet, bedpan, or urinal?: Total Help from another person bathing (including washing, rinsing, drying)?: A Lot Help from another person to put on and taking off regular upper body clothing?: None Help from another person to put on and taking off regular lower body clothing?: A Lot 6 Click Score: 17   End of Session Equipment Utilized During Treatment: Rolling walker (2 wheels)  Activity Tolerance: Patient tolerated treatment well Patient left: in bed;with call bell/phone within reach;with bed alarm set;with family/visitor present  OT Visit Diagnosis: Muscle weakness (generalized) (M62.81)                Time: 8588-5027 OT Time Calculation (min): 38 min Charges:  OT General Charges $OT Visit: 1 Visit OT Evaluation $OT Eval Moderate Complexity: 1 Mod OT Treatments $Therapeutic Activity: 38-52 mins  [employer], OTR/L,CBIS  Supplemental OT - [facility] and [facility] Secure Chat Preferred    Efrat Zuidema, Clarene Duke 11/29/2022, 4:27 PM

## 2022-11-29 NOTE — Evaluation (Signed)
Physical Therapy Evaluation  Patient Details Name: Mary Webb MRN: [id number] DOB: Oct 15, 1956 Today's Date: 11/29/2022  History of Present Illness  Pt is a 67 y/o female who presents to [facility] on 11/23/22 with cholecystitis. She was transferred to [facility] and is now s/p cholecystectomy on 11/27/22. PMH significant for CAD, CKD, CVA, PPM placement 2015, lymphadenopathy, pituitary adenoma, DOE, SSS.   Clinical Impression  Pt admitted with above diagnosis. Pt currently with functional limitations due to the deficits listed below (see PT Problem List). At the time of PT eval pt was able to perform transfers and ambulation with gross min guard assist and RW for support. Pt with heavy reliance on RW for energy conservation as pt fatigues quickly and becomes dyspneic with minimal activity. Pt on RA throughout session and sats 90-91% throughout OOB mobility. She did dip to 88% once but was brief. With 2L/min supplemental O2 donned sats 96%. Recommending HHPT follow up at d/c to maximize functional independence, safety, and tolerance for functional activity. Acutely, pt will benefit from skilled PT to increase their independence and safety with mobility to allow discharge to the venue listed below.          Recommendations for follow up therapy are one component of a multi-disciplinary discharge planning process, led by the attending physician.  Recommendations may be updated based on patient status, additional functional criteria and insurance authorization.  Follow Up Recommendations Home health PT      Assistance Recommended at Discharge Intermittent Supervision/Assistance  Patient can return home with the following  A little help with walking and/or transfers;A little help with bathing/dressing/bathroom;Assistance with cooking/housework;Assist for transportation;Help with stairs or ramp for entrance    Equipment Recommendations Rolling walker (2 wheels) (Bariatric)  Recommendations for Other  Services       Functional Status Assessment Patient has had a recent decline in their functional status and demonstrates the ability to make significant improvements in function in a reasonable and predictable amount of time.     Precautions / Restrictions Precautions Precautions: Fall Precaution Comments: RUQ drain Restrictions Weight Bearing Restrictions: No      Mobility  Bed Mobility Overal bed mobility: Needs Assistance Bed Mobility: Supine to Sit, Sit to Supine     Supine to sit: Min assist Sit to supine: Min assist   General bed mobility comments: Pt reaching for assist to pull trunk into flexion, and required assist to elevate LE's up into bed at end of session. Body habitus limiting technique but did not complain of pain throughout transfers.    Transfers Overall transfer level: Needs assistance Equipment used: Rolling walker (2 wheels) Transfers: Sit to/from Stand Sit to Stand: Min guard           General transfer comment: Hands on assist required for power up to full stand. No overt LOB noted.    Ambulation/Gait Ambulation/Gait assistance: Min guard Gait Distance (Feet): 150 Feet Assistive device: Rolling walker (2 wheels) Gait Pattern/deviations: Step-through pattern, Decreased stride length, Trunk flexed Gait velocity: Decreased Gait velocity interpretation: <1.31 ft/sec, indicative of household ambulator   General Gait Details: Pt quickly SOB with OOB mobility. On RA throughout and sats decreased to 88% briefly but hovered between 90-91%. Pt required standing rest break x3 due to dyspnea.  Stairs            Wheelchair Mobility    Modified Rankin (Stroke Patients Only)       Balance Overall balance assessment: Needs assistance Sitting-balance support: Feet supported, No upper  extremity supported Sitting balance-Leahy Scale: Good     Standing balance support: During functional activity, Bilateral upper extremity supported, Reliant on  assistive device for balance                                 Pertinent Vitals/Pain Pain Assessment Pain Assessment: Faces Faces Pain Scale: Hurts a little bit Pain Location: Abdominal incision Pain Descriptors / Indicators: Operative site guarding, Sore Pain Intervention(s): Limited activity within patient's tolerance, Monitored during session, Repositioned    Home Living Family/patient expects to be discharged to:: Private residence Living Arrangements: Spouse/significant other Available Help at Discharge: Family;Available 24 hours/day (Husband and sister) Type of Home: House Home Access: Level entry       Home Layout: One level Home Equipment: BSC/3in1;Cane - quad;Rollator (4 wheels)      Prior Function Prior Level of Function : Needs assist             Mobility Comments: Uses QC mostly but uses rollator when she goes out. cannot use in the house because it will not fit through home/in bathroom. ADLs Comments: Sponge bathing for about 2 years, husband assists with washing back and feet, uses BSC since sciatic pain started (november 2023), husband assists with cooking/cleaning     Hand Dominance        Extremity/Trunk Assessment   Upper Extremity Assessment Upper Extremity Assessment: Defer to OT evaluation    Lower Extremity Assessment Lower Extremity Assessment: Overall WFL for tasks assessed (Mildly limited with mobility due to body habitus but overall functional.)    Cervical / Trunk Assessment Cervical / Trunk Assessment: Normal;Other exceptions Cervical / Trunk Exceptions: Increased body habitus  Communication   Communication: No difficulties  Cognition Arousal/Alertness: Awake/alert Behavior During Therapy: WFL for tasks assessed/performed Overall Cognitive Status: Within Functional Limits for tasks assessed                                          General Comments      Exercises     Assessment/Plan    PT  Assessment Patient needs continued PT services  PT Problem List Decreased strength;Decreased range of motion;Decreased activity tolerance;Decreased balance;Decreased mobility;Decreased knowledge of use of DME;Decreased safety awareness;Decreased knowledge of precautions;Cardiopulmonary status limiting activity;Pain       PT Treatment Interventions DME instruction;Gait training;Functional mobility training;Therapeutic activities;Therapeutic exercise;Balance training;Patient/family education    PT Goals (Current goals can be found in the Care Plan section)  Acute Rehab PT Goals Patient Stated Goal: Return home at d/c PT Goal Formulation: With patient/family Time For Goal Achievement: 12/06/22 Potential to Achieve Goals: Good    Frequency Min 3X/week     Co-evaluation               AM-PAC PT "6 Clicks" Mobility  Outcome Measure Help needed turning from your back to your side while in a flat bed without using bedrails?: A Little Help needed moving from lying on your back to sitting on the side of a flat bed without using bedrails?: A Little Help needed moving to and from a bed to a chair (including a wheelchair)?: A Little Help needed standing up from a chair using your arms (e.g., wheelchair or bedside chair)?: A Little Help needed to walk in hospital room?: A Little Help needed climbing 3-5 steps with a railing? :  A Little 6 Click Score: 18    End of Session Equipment Utilized During Treatment: Gait belt;Oxygen Activity Tolerance: Patient tolerated treatment well Patient left: in bed;with call bell/phone within reach;with family/visitor present Nurse Communication: Mobility status PT Visit Diagnosis: Unsteadiness on feet (R26.81);Difficulty in walking, not elsewhere classified (R26.2)    Time: 9509-3267 PT Time Calculation (min) (ACUTE ONLY): 36 min   Charges:   PT Evaluation $PT Eval Moderate Complexity: 1 Mod PT Treatments $Gait Training: 8-22 mins        Rolinda Roan, PT, DPT Acute Rehabilitation Services Secure Chat Preferred Office: [phone]   Thelma Comp 11/29/2022, 1:17 PM

## 2022-11-30 DIAGNOSIS — R7989 Other specified abnormal findings of blood chemistry: Secondary | ICD-10-CM | POA: Diagnosis not present

## 2022-11-30 LAB — COMPREHENSIVE METABOLIC PANEL
ALT: 97 U/L — ABNORMAL HIGH (ref 0–44)
AST: 33 U/L (ref 15–41)
Albumin: 2.6 g/dL — ABNORMAL LOW (ref 3.5–5.0)
Alkaline Phosphatase: 94 U/L (ref 38–126)
Anion gap: 9 (ref 5–15)
BUN: 11 mg/dL (ref 8–23)
CO2: 30 mmol/L (ref 22–32)
Calcium: 8.5 mg/dL — ABNORMAL LOW (ref 8.9–10.3)
Chloride: 99 mmol/L (ref 98–111)
Creatinine, Ser: 1 mg/dL (ref 0.44–1.00)
GFR, Estimated: >60 mL/min (ref >60)
Glucose, Bld: 113 mg/dL — ABNORMAL HIGH (ref 70–99)
Potassium: 3.1 mmol/L — ABNORMAL LOW (ref 3.5–5.1)
Sodium: 138 mmol/L (ref 135–145)
Total Bilirubin: 1 mg/dL (ref 0.3–1.2)
Total Protein: 6.1 g/dL — ABNORMAL LOW (ref 6.5–8.1)

## 2022-11-30 LAB — CBC WITH DIFFERENTIAL/PLATELET
Abs Immature Granulocytes: 0.04 10*3/uL (ref 0.00–0.07)
Basophils Absolute: 0.1 10*3/uL (ref 0.0–0.1)
Basophils Relative: 1 %
Eosinophils Absolute: 0.4 10*3/uL (ref 0.0–0.5)
Eosinophils Relative: 6 %
HCT: 34.9 % — ABNORMAL LOW (ref 36.0–46.0)
Hemoglobin: 11.6 g/dL — ABNORMAL LOW (ref 12.0–15.0)
Immature Granulocytes: 1 %
Lymphocytes Relative: 19 %
Lymphs Abs: 1.5 10*3/uL (ref 0.7–4.0)
MCH: 31.7 pg (ref 26.0–34.0)
MCHC: 33.2 g/dL (ref 30.0–36.0)
MCV: 95.4 fL (ref 80.0–100.0)
Monocytes Absolute: 0.9 10*3/uL (ref 0.1–1.0)
Monocytes Relative: 12 %
Neutro Abs: 4.8 10*3/uL (ref 1.7–7.7)
Neutrophils Relative %: 61 %
Platelets: 215 10*3/uL (ref 150–400)
RBC: 3.66 MIL/uL — ABNORMAL LOW (ref 3.87–5.11)
RDW: 14.4 % (ref 11.5–15.5)
WBC: 7.7 10*3/uL (ref 4.0–10.5)
nRBC: 0 % (ref 0.0–0.2)

## 2022-11-30 LAB — SURGICAL PATHOLOGY

## 2022-11-30 LAB — GLUCOSE, CAPILLARY
Glucose-Capillary: 112 mg/dL — ABNORMAL HIGH (ref 70–99)
Glucose-Capillary: 114 mg/dL — ABNORMAL HIGH (ref 70–99)
Glucose-Capillary: 122 mg/dL — ABNORMAL HIGH (ref 70–99)
Glucose-Capillary: 125 mg/dL — ABNORMAL HIGH (ref 70–99)

## 2022-11-30 LAB — MAGNESIUM: Magnesium: 1.8 mg/dL (ref 1.7–2.4)

## 2022-11-30 MED ORDER — POTASSIUM CHLORIDE CRYS ER 20 MEQ PO TBCR
40.0000 meq | EXTENDED_RELEASE_TABLET | Freq: Once | ORAL | Status: AC
  Administered 2022-11-30: 40 meq via ORAL
  Filled 2022-11-30: qty 2

## 2022-11-30 MED ORDER — FUROSEMIDE 10 MG/ML IJ SOLN
40.0000 mg | Freq: Once | INTRAMUSCULAR | Status: AC
  Administered 2022-11-30: 40 mg via INTRAVENOUS
  Filled 2022-11-30: qty 4

## 2022-11-30 NOTE — TOC Transition Note (Signed)
Transition of Care [facility]) - CM/SW Discharge Note   Patient Details  Name: Mary Webb MRN: [id number] Date of Birth: 05-11-1956  [facility] CM/SW Contact:  Carles Collet, RN Phone Number: 11/30/2022, 11:54 AM   Clinical Narrative:     Damaris Schooner w patient and spouse at bedside. She will stay at her mom's house on the same property, her address is   [place] [address]   Patient will need home oxygen, referral placed to [employer] who will deliver it to the room. [employer] aware that they have a wood stove at the home and will set up accordingly. They were updated with correct address [employer] is delivering the bari walker to the home and will leave it on the front porch today. They were updated with correct address [place] notified of DC, with update to address Spouse will provide transportation home  Final next level of care: Home w Home Health Services Barriers to Discharge: No Barriers Identified   Patient Goals and CMS Choice CMS Medicare.gov Compare Post Acute Care list provided to:: Patient Choice offered to / list presented to : Patient  Discharge Placement                         Discharge Plan and Services Additional resources added to the After Visit Summary for     Discharge Planning Services: CM Consult Post Acute Care Choice: Home Health          DME Arranged: Oxygen DME Agency: [employer] Date DME Agency Contacted: 11/30/22 Time DME Agency Contacted: [id number] Representative spoke with at DME Agency: Brenton Grills HH Arranged: PT [place]: [place] Date [place]: 11/29/22 Time [place]: [id number] Representative spoke with at [place]: [place] Determinants of Health ([place]) Interventions SDOH Screenings   Food Insecurity: No Food Insecurity (01/24/2022)  Housing: Low Risk  (01/24/2022)  Transportation Needs: No Transportation Needs (01/24/2022)  Alcohol Screen: Low Risk  (01/24/2022)   Depression (PHQ2-9): Low Risk  (07/25/2022)  Financial Resource Strain: Low Risk  (01/24/2022)  Physical Activity: Inactive (01/24/2022)  Social Connections: Moderately Isolated (01/24/2022)  Stress: No Stress Concern Present (01/24/2022)  Tobacco Use: Low Risk  (11/28/2022)     Readmission Risk Interventions     No data to display

## 2022-11-30 NOTE — Discharge Summary (Signed)
Physician Discharge Summary  Mary Webb TLX:[id number] DOB: [birth year] DOA: 11/24/2022  PCP: [name], NP  Admit date: 11/24/2022 Discharge date: 11/30/2022  Admitted From: home Disposition:  home  Recommendations for Outpatient Follow-up:  Follow up with PCP in 1-2 weeks Please obtain BMP/CBC in one week  Home Health: none Equipment/Devices: home O2  Discharge Condition: stable CODE STATUS: Full code Diet Orders (From admission, onward)     Start     Ordered   11/28/22 0505  Diet Carb Modified Fluid consistency: Thin; Room service appropriate? Yes  Diet effective 0500       Question Answer Comment  Diet-HS Snack? Nothing   Calorie Level Medium 1600-2000   Fluid consistency: Thin   Room service appropriate? 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  Patient presented to [facility] with complaints of abdominal pain.  Patient states she thought was a virus.  The pain was at the top of her stomach.  She is unable to sleep as a result.  She had nausea and vomiting that was green.  The emesis did not relieve her abdominal pain.  She denies fevers or chills.  Denies diarrhea but endorses constipation.  Her pain was continuous, she describes electrocardioverted stomach.  She states was heavy and crampy, at its worst 10/10, currently 0/10. Patient had similar symptoms approximately 3 weeks prior, at that time it lasted 3 days.  She had nausea and emesis.  Her emesis was clear-colored at this time.  Her abdominal discomfort ceased after emesis.  This time emesis did not resolve the pain.   Of note the patient has been off her Plavix for the past 5 days.  She is scheduled for change out of her PPM. At [place] patient's LFTs were elevated, these are concerning for biliary/gallbladder cause of her pain.  Patient unable to get  MRI as she has a PPM in place.    CT scan abdomen pancreatic protocol for evaluation of biliary duct done.  No abnormalities was noted in the biliary ducts.  Patient was seen by surgeon Cintron-Diaz.  Patient is high risk for gallbladder removal given her weight, BMI 62.  She needed to be at tertiary center.  He states that he could do a cholecystectomy and the intraoperative cholangiogram.  If that is positive she will need a ERCP. [facility] is not equipped to do endoscopic ultrasound to rule out choledocholithiasis.  Transfer was requested.  Hospital Course / Discharge diagnoses: Principal Problem:   Elevated LFTs Active Problems:   Sick sinus syndrome (HCC)   Hypertensive kidney disease with CKD stage III (HCC)   Morbid obesity (HCC)   Hyperlipidemia   Hypertension   History of stroke   Pacemaker   Nausea and vomiting   Abdominal pain  Principal problem Possible acute calculus cholecystitis, Elevated LFTs -Status post EUS on 11/26/2022 which did not show any CBD stones.  Surgery consulted, she is now status post endoscopic cholecystectomy on 11/27/2022.  Wound and drain care as per general surgery.  Rocephin discontinued on 11/28/2022.  She is improving well postoperatively, drain is removed prior to discharge.  She is tolerating a regular diet  Active problems Acute, possibly chronic hypoxic respiratory failure, mild fluid overload, likely obesity hypoventilation-patient received Lasix x 3, clinically is on  the dry side, feeling a whole lot better in terms of her respiratory status and close to baseline.  She does however desats to 88% on room air with ambulation, suspect this likely has a chronic component fluid given morbid obesity History of sick sinus syndrome status post pacemaker -Outpatient follow-up with EP: Dual-chamber generator change out will have to be rescheduled as an outpatient. Leukocytosis -Resolved Hypokalemia -Replaced prior to discharge.  Resume home potassium  Chronic  kidney disease stage IIIa -Creatinine stable.  Monitor Diabetes mellitus type 2 -continue home regimen   Hypertension -continue home regimen Morbid obesity -Outpatient follow-up, BMI 65, she would benefit from weight loss Hyperlipidemia -resume home meds Physical deconditioning - HHPT / OT  Sepsis ruled out   Discharge Instructions   Allergies as of 11/30/2022       Reactions   Elemental Sulfur Anaphylaxis   Levaquin [levofloxacin] Other (See Comments)   Stroke   Tekturna [aliskiren] Shortness Of Breath   Accupril [quinapril Hcl]    Aspirin    Diovan [valsartan]    Erythromycin    Iodine Hives   Latex    Can't remember reaction   Lotensin [benazepril]    Norvasc [amlodipine]    Shellfish Allergy Swelling   Sulfa Antibiotics    Tetanus Toxoids    Tetracycline    Other reaction(s): Unknown   Tetracyclines & Related    Penicillins Rash        Medication List     TAKE these medications    Accu-Chek Aviva Plus w/Device Kit Use to check blood sugar 3 times a day and document for visits.  Goal is <130 fasting and <180 two hours after meal.   Accu-Chek Guide test strip Generic drug: glucose blood Use to check blood sugar 3 times a day and document for visits. Goal is <130 fasting and <180 two hours after meal.,   Accu-Chek Softclix Lancets lancets Use to check blood sugar 3 times a day and document for visits. Goal is <130 fasting and <180 two hours after meal.,   acetaminophen 500 MG tablet Commonly known as: TYLENOL Take 2 tablets (1,000 mg total) by mouth every 6 (six) hours as needed. What changed: reasons to take this   chlorthalidone 25 MG tablet Commonly known as: HYGROTON Take 1 tablet (25 mg total) by mouth daily.   clopidogrel 75 MG tablet Commonly known as: PLAVIX Take 1 tablet (75 mg total) by mouth daily.   diclofenac Sodium 1 % Gel Commonly known as: Voltaren Apply 2 g topically 4 (four) times daily.   fluticasone furoate-vilanterol 100-25  MCG/INH Aepb Commonly known as: BREO ELLIPTA Inhale 1 puff into the lungs daily.   gabapentin 300 MG capsule Commonly known as: NEURONTIN Take 1 capsule (300 mg total) by mouth 3 (three) times daily.   Jardiance 10 MG Tabs tablet Generic drug: empagliflozin Take 1 tablet (10 mg total) by mouth daily.   losartan 100 MG tablet Commonly known as: COZAAR Take 1 tablet (100 mg total) by mouth daily.   potassium chloride SA 20 MEQ tablet Commonly known as: KLOR-CON M Take 1 tablet (20 mEq total) by mouth 2 (two) times daily.   pyridOXINE 25 MG tablet Commonly known as: VITAMIN B6 Take by mouth daily.   traMADol 50 MG tablet Commonly known as: ULTRAM Take 1-2 tablets (50-100 mg total) by mouth every 6 (six) hours as needed for moderate pain or severe pain.   Vitamin B 12 500 MCG Tabs Take 500 mcg by  mouth daily.   vitamin C 1000 MG tablet Take 1,000 mg by mouth 2 (two) times a week.   Vitamin D3 25 MCG (1000 UT) Caps Take 1,000 Units by mouth 3 (three) times a week.               Durable Medical Equipment  (From admission, onward)           Start     Ordered   11/30/22 1136  For home use only DME oxygen  Once       Question Answer Comment  Length of Need 6 Months   Mode or (Route) Nasal cannula   Liters per Minute 2   Frequency Continuous (stationary and portable oxygen unit needed)   Oxygen conserving device Yes   Oxygen delivery system Gas      11/30/22 1135   11/29/22 1549  For home use only DME Walker rolling  Once       Comments: Rolling walker (2 wheels) (Bariatric)  5'2"  wt 169.4 kg  Question Answer Comment  Walker: With 5 Inch Wheels   Patient needs a walker to treat with the following condition Weakness      11/29/22 1548            Follow-up Information     Maczis, Carlena Hurl, PA-C Follow up.   Specialty: General Surgery Why: our office is scheduling you for post-operative follow up in 2-3 weeks. call to confirm appointment  date/time. Contact information: [place] [address] [phone]         [facility], [facility] Follow up.   Specialty: Home Health Services Contact information: [place] [address] [phone]                 Consultations: General surgery   Procedures/Studies:  DG Cholangiogram Operative  Result Date: 11/27/2022 CLINICAL DATA:  Laparoscopic cholecystectomy with intraoperative cholangiogram EXAM: INTRAOPERATIVE CHOLANGIOGRAM TECHNIQUE: Cholangiographic images from the C-arm fluoroscopic device were submitted for interpretation post-operatively. Please see the procedural report for the amount of contrast and the fluoroscopy time utilized. FLUOROSCOPY: Radiation Exposure Index (as provided by the fluoroscopic device): 25.9 mGy Kerma COMPARISON:  CT abdomen/pelvis 11/23/2022 FINDINGS: 2 cine clips are submitted for review. The images demonstrate injection of contrast material but no opacification of the biliary tree. IMPRESSION: Unsuccessful intraoperative cholangiogram. No opacification of the biliary tree. Electronically Signed   By: Jacqulynn Cadet M.D.   On: 11/27/2022 09:35   CT ABDOMEN PELVIS W CONTRAST  Result Date: 11/23/2022 CLINICAL DATA:  Abdominal pain EXAM: CT ABDOMEN AND PELVIS WITH CONTRAST TECHNIQUE: Multidetector CT imaging of the abdomen and pelvis was performed using the standard protocol following bolus administration of intravenous contrast. RADIATION DOSE REDUCTION: This exam was performed according to the departmental dose-optimization program which includes automated exposure control, adjustment of the mA and/or kV according to patient size and/or use of iterative reconstruction technique. CONTRAST:  75m OMNIPAQUE IOHEXOL 300 MG/ML  SOLN COMPARISON:  Noncontrast CT done on 07/13/2014 FINDINGS: Lower chest: There are small linear densities in lower lung fields suggesting scarring or subsegmental  atelectasis. Pacemaker leads are noted in place. Hepatobiliary: No focal abnormalities are seen in liver. There is mild stranding adjacent to the gallbladder. Gallbladder is distended. There is no dilation of bile ducts. Pancreas: No focal abnormalities are seen. Spleen: Unremarkable. Adrenals/Urinary Tract: Adrenals are unremarkable. There is no hydronephrosis. There is 2.9 cm smooth marginated fluid density lesion  in the anterior midportion of left kidney suggesting renal cyst. No follow-up imaging is recommended. There are no renal or ureteral stones. Urinary bladder is unremarkable. Stomach/Bowel: Stomach is moderately distended with fluid. There is no significant wall thickening in stomach. Small bowel loops are not dilated. Appendix is not dilated. There is no significant wall thickening in colon. There is no pericolic stranding. Vascular/Lymphatic: Scattered arterial calcifications are seen. Reproductive: Uterus is not seen. There is 6.3 x 5.2 cm smooth marginated fluid density lesion in the right adnexa. This lesion has increased in size. There are no definite internal septations. Other: There is no ascites or pneumoperitoneum. There is large umbilical hernia containing fat. Musculoskeletal: Degenerative changes are noted in lower thoracic spine and lumbar spine. Degenerative changes are noted in right hip. No acute findings are noted. IMPRESSION: There is mild pericholecystic stranding. Please correlate with clinical symptoms and laboratory findings to evaluate for acute cholecystitis. Follow-up gallbladder sonogram may be considered. There is no evidence of intestinal obstruction or pneumoperitoneum. There is no hydronephrosis. Appendix is not dilated. There is 6.3 cm smooth marginated fluid density lesion in the right adnexa which has increased in size since 07/13/2014. In view of patient's age, follow-up pelvic sonogram may be considered for further characterization. Small linear densities in the lower  lung fields may suggest minimal scarring or subsegmental atelectasis. There is 2.9 cm smooth marginated fluid density lesion in the midportion of left kidney suggesting possible simple cysts. Other findings as described in the body of the report. Other findings as described in the body of the report. Electronically Signed   By: Elmer Picker M.D.   On: 11/23/2022 18:38   US Abdomen Limited RUQ (LIVER/GB)  Result Date: 11/23/2022 CLINICAL DATA:  Abdominal pain over the last day. EXAM: ULTRASOUND ABDOMEN LIMITED RIGHT UPPER QUADRANT COMPARISON:  CT 07/13/2014 FINDINGS: Gallbladder: Sludge and small mobile stones in the gallbladder. No Murphy sign. Largest stone measures 10 mm. No wall thickening or adjacent fluid. Common bile duct: Diameter: 4.9 mm, within normal limits. Liver: Mildly heterogeneous echo pattern suggesting steatosis. No focal lesion evident. No ductal dilatation. Portal vein is patent on color Doppler imaging with normal direction of blood flow towards the liver. Other: None. IMPRESSION: 1. Sludge and small mobile stones in the gallbladder. No wall thickening or adjacent fluid. No Murphy sign. 2. Mild hepatic steatosis. Electronically Signed   By: Nelson Chimes M.D.   On: 11/23/2022 10:37     Subjective: - no chest pain, shortness of breath, no abdominal pain, nausea or vomiting.   Discharge Exam: BP 138/71 (BP Location: Right Arm)   Pulse 62   Temp 98.2 F (36.8 C) (Oral)   Resp 16   Ht 5' 2.01" (1.575 m)   Wt (!) 163.4 kg   SpO2 96%   BMI 65.87 kg/m   General: Pt is alert, awake, not in acute distress Cardiovascular: RRR, S1/S2 +, no rubs, no gallops Respiratory: CTA bilaterally, no wheezing, no rhonchi Abdominal: Soft, NT, ND, bowel sounds + Extremities: no edema, no cyanosis    The results of significant diagnostics from this hospitalization (including imaging, microbiology, ancillary and laboratory) are listed below for reference.     Microbiology: Recent  Results (from the past 240 hour(s))  Resp panel by RT-PCR (RSV, Flu A&B, Covid) Anterior Nasal Swab     Status: None   Collection Time: 11/23/22 11:31 AM   Specimen: Anterior Nasal Swab  Result Value Ref Range Status   SARS Coronavirus 2  by RT PCR NEGATIVE NEGATIVE Final    Comment: (NOTE) SARS-CoV-2 target nucleic acids are NOT DETECTED.  The SARS-CoV-2 RNA is generally detectable in upper respiratory specimens during the acute phase of infection. The lowest concentration of SARS-CoV-2 viral copies this assay can detect is 138 copies/mL. A negative result does not preclude SARS-Cov-2 infection and should not be used as the sole basis for treatment or other patient management decisions. A negative result may occur with  improper specimen collection/handling, submission of specimen other than nasopharyngeal swab, presence of viral mutation(s) within the areas targeted by this assay, and inadequate number of viral copies(<138 copies/mL). A negative result must be combined with clinical observations, patient history, and epidemiological information. The expected result is Negative.  Fact Sheet for Patients:  EntrepreneurPulse.com.au  Fact Sheet for Healthcare Providers:  IncredibleEmployment.be  This test is no t yet approved or cleared by the [place] FDA and  has been authorized for detection and/or diagnosis of SARS-CoV-2 by FDA under an Emergency Use Authorization (EUA). This EUA will remain  in effect (meaning this test can be used) for the duration of the COVID-19 declaration under Section 564(b)(1) of the Act, 21 U.S.C.section 360bbb-3(b)(1), unless the authorization is terminated  or revoked sooner.       Influenza A by PCR NEGATIVE NEGATIVE Final   Influenza B by PCR NEGATIVE NEGATIVE Final    Comment: (NOTE) The Xpert Xpress SARS-CoV-2/FLU/RSV plus assay is intended as an aid in the diagnosis of influenza from Nasopharyngeal  swab specimens and should not be used as a sole basis for treatment. Nasal washings and aspirates are unacceptable for Xpert Xpress SARS-CoV-2/FLU/RSV testing.  Fact Sheet for Patients: EntrepreneurPulse.com.au  Fact Sheet for Healthcare Providers: IncredibleEmployment.be  This test is not yet approved or cleared by the [place] FDA and has been authorized for detection and/or diagnosis of SARS-CoV-2 by FDA under an Emergency Use Authorization (EUA). This EUA will remain in effect (meaning this test can be used) for the duration of the COVID-19 declaration under Section 564(b)(1) of the Act, 21 U.S.C. section 360bbb-3(b)(1), unless the authorization is terminated or revoked.     Resp Syncytial Virus by PCR NEGATIVE NEGATIVE Final    Comment: (NOTE) Fact Sheet for Patients: EntrepreneurPulse.com.au  Fact Sheet for Healthcare Providers: IncredibleEmployment.be  This test is not yet approved or cleared by the [place] FDA and has been authorized for detection and/or diagnosis of SARS-CoV-2 by FDA under an Emergency Use Authorization (EUA). This EUA will remain in effect (meaning this test can be used) for the duration of the COVID-19 declaration under Section 564(b)(1) of the Act, 21 U.S.C. section 360bbb-3(b)(1), unless the authorization is terminated or revoked.  Performed at [facility], [address] [place], [place] [address]   Surgical PCR screen     Status: None   Collection Time: 11/24/22  7:36 AM   Specimen: Nasal Mucosa; Nasal Swab  Result Value Ref Range Status   MRSA, PCR NEGATIVE NEGATIVE Final   Staphylococcus aureus NEGATIVE NEGATIVE Final    Comment: (NOTE) The Xpert SA Assay (FDA approved for NASAL specimens in patients 20 years of age and older), is one component of a comprehensive surveillance program. It is not intended to diagnose infection nor to guide or  monitor treatment. Performed at [facility], [address] [place],  [address]      Labs: Basic Metabolic Panel: Recent Labs  Lab 11/26/22 0250 11/27/22 0234 11/28/22 0258 11/29/22 0313 11/30/22  0324  NA 136 136 135 136 138  K 3.1* 3.5 3.3* 3.1* 3.1*  CL 101 100 100 100 99  CO2 '27 27 26 30 30  '$ GLUCOSE 103* 105* 105* 135* 113*  BUN '9 9 8 13 11  '$ CREATININE 1.10* 1.11* 0.99 1.17* 1.00  CALCIUM 8.3* 8.6* 8.3* 8.4* 8.5*  MG 2.3 2.3 2.0 1.8 1.8   Liver Function Tests: Recent Labs  Lab 11/26/22 0250 11/27/22 0234 11/28/22 0258 11/29/22 0313 11/30/22 0324  AST 144* 116* 102* 56* 33  ALT 242* 231* 199* 139* 97*  ALKPHOS 124 140* 104 106 94  BILITOT 4.9* 2.0* 1.2 1.1 1.0  PROT 6.5 7.1 6.6 6.0* 6.1*  ALBUMIN 2.7* 2.9* 2.8* 2.6* 2.6*   CBC: Recent Labs  Lab 11/25/22 0355 11/26/22 0250 11/27/22 0234 11/29/22 0313 11/30/22 0324  WBC 8.2 5.4 10.3 7.8 7.7  NEUTROABS 6.0 3.2 8.5* 5.2 4.8  HGB 14.5 12.4 13.2 11.6* 11.6*  HCT 45.7 38.1 39.9 36.4 34.9*  MCV 97.0 95.0 94.5 97.1 95.4  PLT 255 234 248 216 215   CBG: Recent Labs  Lab 11/29/22 1729 11/29/22 2000 11/30/22 0041 11/30/22 0425 11/30/22 0837  GLUCAP 106* 148* 122* 112* 125*   Hgb A1c No results for input(s): "HGBA1C" in the last 72 hours. Lipid Profile No results for input(s): "CHOL", "HDL", "LDLCALC", "TRIG", "CHOLHDL", "LDLDIRECT" in the last 72 hours. Thyroid function studies No results for input(s): "TSH", "T4TOTAL", "T3FREE", "THYROIDAB" in the last 72 hours.  Invalid input(s): "FREET3" Urinalysis    Component Value Date/Time   COLORURINE AMBER (A) 07/20/2022 1235   APPEARANCEUR CLEAR (A) 07/20/2022 1235   APPEARANCEUR Clear 01/21/2022 1336   LABSPEC 1.008 07/20/2022 1235   LABSPEC 1.020 07/13/2014 0130   PHURINE 6.0 07/20/2022 1235   GLUCOSEU NEGATIVE 07/20/2022 1235   GLUCOSEU Negative 07/13/2014 0130   HGBUR NEGATIVE 07/20/2022 1235   BILIRUBINUR NEGATIVE 07/20/2022 1235    BILIRUBINUR Negative 01/21/2022 1336   BILIRUBINUR Negative 07/13/2014 0130   KETONESUR NEGATIVE 07/20/2022 1235   PROTEINUR NEGATIVE 07/20/2022 1235   NITRITE NEGATIVE 07/20/2022 1235   LEUKOCYTESUR NEGATIVE 07/20/2022 1235   LEUKOCYTESUR 1+ 07/13/2014 0130    FURTHER DISCHARGE INSTRUCTIONS:   Get Medicines reviewed and adjusted: Please take all your medications with you for your next visit with your Primary MD   Laboratory/radiological data: Please request your Primary MD to go over all hospital tests and procedure/radiological results at the follow up, please ask your Primary MD to get all Hospital records sent to his/her office.   In some cases, they will be blood work, cultures and biopsy results pending at the time of your discharge. Please request that your primary care M.D. goes through all the records of your hospital data and follows up on these results.   Also Note the following: If you experience worsening of your admission symptoms, develop shortness of breath, life threatening emergency, suicidal or homicidal thoughts you must seek medical attention immediately by calling 911 or calling your MD immediately  if symptoms less severe.   You must read complete instructions/literature along with all the possible adverse reactions/side effects for all the Medicines you take and that have been prescribed to you. Take any new Medicines after you have completely understood and accpet all the possible adverse reactions/side effects.    Do not drive when taking Pain medications or sleeping medications (Benzodaizepines)   Do not take more than prescribed Pain, Sleep and Anxiety Medications. It is not advisable to combine  anxiety,sleep and pain medications without talking with your primary care practitioner   Special Instructions: If you have smoked or chewed Tobacco  in the last 2 yrs please stop smoking, stop any regular Alcohol  and or any Recreational drug use.   Wear Seat belts  while driving.   Please note: You were cared for by a hospitalist during your hospital stay. Once you are discharged, your primary care physician will handle any further medical issues. Please note that NO REFILLS for any discharge medications will be authorized once you are discharged, as it is imperative that you return to your primary care physician (or establish a relationship with a primary care physician if you do not have one) for your post hospital discharge needs so that they can reassess your need for medications and monitor your lab values.  Time coordinating discharge: 40 minutes  SIGNED:  Marzetta Board, MD, PhD 11/30/2022, 11:35 AM

## 2022-11-30 NOTE — Progress Notes (Addendum)
Doing well. Pain controlled. Tolerating PO. +flatus.   Abd soft, incisions C/D/I   Remains stable for discharge from surgical perspective.  Drain SS so will remove today.   Follow up and pain Rx provided.    Obie Dredge, PA-C [facility] Please see Amion for pager number during day hours 7:00am-4:30pm

## 2022-11-30 NOTE — Progress Notes (Signed)
SATURATION QUALIFICATIONS: (This note is used to comply with regulatory documentation for home oxygen)  Patient Saturations on Room Air at Rest = 93%  Patient Saturations on Room Air while Ambulating = 88%  Patient Saturations on 2 Liters of oxygen while Ambulating = 96%  Please briefly explain why patient needs home oxygen:

## 2022-11-30 NOTE — Plan of Care (Signed)
Problem: Education: Goal: Knowledge of General Education information will improve Description: Including pain rating scale, medication(s)/side effects and non-pharmacologic comfort measures 11/30/2022 0708 by Particia Lather, RN Outcome: Progressing 11/30/2022 0708 by Particia Lather, RN Outcome: Progressing   Problem: Health Behavior/Discharge Planning: Goal: Ability to manage health-related needs will improve 11/30/2022 0708 by Particia Lather, RN Outcome: Progressing 11/30/2022 0708 by Particia Lather, RN Outcome: Progressing   Problem: Clinical Measurements: Goal: Ability to maintain clinical measurements within normal limits will improve 11/30/2022 0708 by Particia Lather, RN Outcome: Progressing 11/30/2022 0708 by Particia Lather, RN Outcome: Progressing Goal: Will remain free from infection 11/30/2022 0708 by Particia Lather, RN Outcome: Progressing 11/30/2022 0708 by Particia Lather, RN Outcome: Progressing Goal: Diagnostic test results will improve 11/30/2022 0708 by Particia Lather, RN Outcome: Progressing 11/30/2022 0708 by Particia Lather, RN Outcome: Progressing Goal: Respiratory complications will improve 11/30/2022 0708 by Particia Lather, RN Outcome: Progressing 11/30/2022 0708 by Particia Lather, RN Outcome: Progressing Goal: Cardiovascular complication will be avoided 11/30/2022 0708 by Particia Lather, RN Outcome: Progressing 11/30/2022 0708 by Particia Lather, RN Outcome: Progressing   Problem: Activity: Goal: Risk for activity intolerance will decrease 11/30/2022 0708 by Particia Lather, RN Outcome: Progressing 11/30/2022 0708 by Particia Lather, RN Outcome: Progressing   Problem: Nutrition: Goal: Adequate nutrition will be maintained 11/30/2022 0708 by Particia Lather, RN Outcome: Progressing 11/30/2022 0708 by Particia Lather, RN Outcome: Progressing   Problem: Coping: Goal: Level of anxiety will decrease 11/30/2022  0708 by Particia Lather, RN Outcome: Progressing 11/30/2022 0708 by Particia Lather, RN Outcome: Progressing   Problem: Elimination: Goal: Will not experience complications related to bowel motility 11/30/2022 0708 by Particia Lather, RN Outcome: Progressing 11/30/2022 0708 by Particia Lather, RN Outcome: Progressing Goal: Will not experience complications related to urinary retention 11/30/2022 0708 by Particia Lather, RN Outcome: Progressing 11/30/2022 0708 by Particia Lather, RN Outcome: Progressing   Problem: Pain Managment: Goal: General experience of comfort will improve 11/30/2022 0708 by Particia Lather, RN Outcome: Progressing 11/30/2022 0708 by Particia Lather, RN Outcome: Progressing   Problem: Safety: Goal: Ability to remain free from injury will improve 11/30/2022 0708 by Particia Lather, RN Outcome: Progressing 11/30/2022 0708 by Particia Lather, RN Outcome: Progressing   Problem: Skin Integrity: Goal: Risk for impaired skin integrity will decrease 11/30/2022 0708 by Particia Lather, RN Outcome: Progressing 11/30/2022 0708 by Particia Lather, RN Outcome: Progressing   Problem: Education: Goal: Knowledge of cardiac device and self-care will improve 11/30/2022 0708 by Particia Lather, RN Outcome: Progressing 11/30/2022 0708 by Particia Lather, RN Outcome: Progressing Goal: Ability to safely manage health related needs after discharge will improve 11/30/2022 0708 by Particia Lather, RN Outcome: Progressing 11/30/2022 0708 by Particia Lather, RN Outcome: Progressing Goal: Individualized Educational Video(s) 11/30/2022 0708 by Particia Lather, RN Outcome: Progressing 11/30/2022 0708 by Particia Lather, RN Outcome: Progressing   Problem: Cardiac: Goal: Ability to achieve and maintain adequate cardiopulmonary perfusion will improve 11/30/2022 0708 by Particia Lather, RN Outcome: Progressing 11/30/2022 0708 by Particia Lather,  RN Outcome: Progressing   Problem: Education: Goal: Ability to describe self-care measures that may prevent or decrease complications (Diabetes Survival Skills Education) will improve 11/30/2022 0708 by Particia Lather, RN Outcome: Progressing 11/30/2022 0708 by Particia Lather, RN Outcome: Progressing Goal: Individualized Educational Video(s) 11/30/2022 0708 by  Particia Lather, RN Outcome: Progressing 11/30/2022 0708 by Particia Lather, RN Outcome: Progressing   Problem: Coping: Goal: Ability to adjust to condition or change in health will improve 11/30/2022 0708 by Particia Lather, RN Outcome: Progressing 11/30/2022 0708 by Particia Lather, RN Outcome: Progressing   Problem: Fluid Volume: Goal: Ability to maintain a balanced intake and output will improve 11/30/2022 0708 by Particia Lather, RN Outcome: Progressing 11/30/2022 0708 by Particia Lather, RN Outcome: Progressing   Problem: Health Behavior/Discharge Planning: Goal: Ability to identify and utilize available resources and services will improve 11/30/2022 0708 by Particia Lather, RN Outcome: Progressing 11/30/2022 0708 by Particia Lather, RN Outcome: Progressing Goal: Ability to manage health-related needs will improve 11/30/2022 0708 by Particia Lather, RN Outcome: Progressing 11/30/2022 0708 by Particia Lather, RN Outcome: Progressing   Problem: Metabolic: Goal: Ability to maintain appropriate glucose levels will improve 11/30/2022 0708 by Particia Lather, RN Outcome: Progressing 11/30/2022 0708 by Particia Lather, RN Outcome: Progressing   Problem: Nutritional: Goal: Maintenance of adequate nutrition will improve 11/30/2022 0708 by Particia Lather, RN Outcome: Progressing 11/30/2022 0708 by Particia Lather, RN Outcome: Progressing Goal: Progress toward achieving an optimal weight will improve 11/30/2022 0708 by Particia Lather, RN Outcome: Progressing 11/30/2022 0708 by Particia Lather, RN Outcome: Progressing   Problem: Skin Integrity: Goal: Risk for impaired skin integrity will decrease 11/30/2022 0708 by Particia Lather, RN Outcome: Progressing 11/30/2022 0708 by Particia Lather, RN Outcome: Progressing   Problem: Tissue Perfusion: Goal: Adequacy of tissue perfusion will improve 11/30/2022 0708 by Particia Lather, RN Outcome: Progressing 11/30/2022 0708 by Particia Lather, RN Outcome: Progressing

## 2022-11-30 NOTE — Plan of Care (Signed)
  Problem: Education: Goal: Knowledge of General Education information will improve Description: Including pain rating scale, medication(s)/side effects and non-pharmacologic comfort measures Outcome: Adequate for Discharge   Problem: Health Behavior/Discharge Planning: Goal: Ability to manage health-related needs will improve Outcome: Adequate for Discharge   Problem: Clinical Measurements: Goal: Ability to maintain clinical measurements within normal limits will improve Outcome: Adequate for Discharge Goal: Will remain free from infection Outcome: Adequate for Discharge Goal: Diagnostic test results will improve Outcome: Adequate for Discharge Goal: Respiratory complications will improve Outcome: Adequate for Discharge Goal: Cardiovascular complication will be avoided Outcome: Adequate for Discharge   Problem: Activity: Goal: Risk for activity intolerance will decrease Outcome: Adequate for Discharge   Problem: Nutrition: Goal: Adequate nutrition will be maintained Outcome: Adequate for Discharge   Problem: Coping: Goal: Level of anxiety will decrease Outcome: Adequate for Discharge   Problem: Elimination: Goal: Will not experience complications related to bowel motility Outcome: Adequate for Discharge Goal: Will not experience complications related to urinary retention Outcome: Adequate for Discharge   Problem: Pain Managment: Goal: General experience of comfort will improve Outcome: Adequate for Discharge   Problem: Safety: Goal: Ability to remain free from injury will improve Outcome: Adequate for Discharge   Problem: Skin Integrity: Goal: Risk for impaired skin integrity will decrease Outcome: Adequate for Discharge   Problem: Education: Goal: Knowledge of cardiac device and self-care will improve Outcome: Adequate for Discharge Goal: Ability to safely manage health related needs after discharge will improve Outcome: Adequate for Discharge Goal:  Individualized Educational Video(s) Outcome: Adequate for Discharge   Problem: Cardiac: Goal: Ability to achieve and maintain adequate cardiopulmonary perfusion will improve Outcome: Adequate for Discharge   Problem: Education: Goal: Ability to describe self-care measures that may prevent or decrease complications (Diabetes Survival Skills Education) will improve Outcome: Adequate for Discharge Goal: Individualized Educational Video(s) Outcome: Adequate for Discharge   Problem: Coping: Goal: Ability to adjust to condition or change in health will improve Outcome: Adequate for Discharge   Problem: Fluid Volume: Goal: Ability to maintain a balanced intake and output will improve Outcome: Adequate for Discharge   Problem: Health Behavior/Discharge Planning: Goal: Ability to identify and utilize available resources and services will improve Outcome: Adequate for Discharge Goal: Ability to manage health-related needs will improve Outcome: Adequate for Discharge   Problem: Metabolic: Goal: Ability to maintain appropriate glucose levels will improve Outcome: Adequate for Discharge   Problem: Nutritional: Goal: Maintenance of adequate nutrition will improve Outcome: Adequate for Discharge Goal: Progress toward achieving an optimal weight will improve Outcome: Adequate for Discharge   Problem: Skin Integrity: Goal: Risk for impaired skin integrity will decrease Outcome: Adequate for Discharge   Problem: Tissue Perfusion: Goal: Adequacy of tissue perfusion will improve Outcome: Adequate for Discharge

## 2022-11-30 NOTE — Progress Notes (Signed)
Mobility Specialist - Progress Note   11/30/22 1117  Mobility  Activity Ambulated with assistance in hallway  Level of Assistance Standby assist, set-up cues, supervision of patient - no hands on  Assistive Device Front wheel walker  Distance Ambulated (ft) 120 ft  Activity Response Tolerated well  Mobility Referral Yes  $Mobility charge 1 Mobility    Pt received in bed agreeable to mobility. C/o SOB, encouraged PLB and took standing rest break x2. Left sitting EOB w/ call bell in reach and all needs met.   [facility] Specialist Please contact via SecureChat or Rehab office at [phone]

## 2022-12-01 ENCOUNTER — Telehealth: Payer: Self-pay | Admitting: *Deleted

## 2022-12-01 NOTE — Patient Outreach (Signed)
  Care Coordination TOC Note Transition Care Management Follow-up Telephone Call Date of discharge and from where:[id number] [facility]   How have you been since you were released from the hospital? Some shortness of breath. I am using my oxygen. Patient has a cough of thick mucous Any questions or concerns? No  Items Reviewed: Did the pt receive and understand the discharge instructions provided? Yes  Medications obtained and verified? Yes  Other? No  Any new allergies since your discharge? No  Dietary orders reviewed? No Do you have support at home? Yes   Home Care and Equipment/Supplies: Were home health services ordered? Yes PT If so, what is the name of the agency? [facility]  Has the agency set up a time to come to the patient's home? no Were any new equipment or medical supplies ordered?  Yes: Walker and O2 What is the name of the medical supply agency? [employer] Were you able to get the supplies/equipment? yes Do you have any questions related to the use of the equipment or supplies? No  Functional Questionnaire: (I = Independent and D = Dependent) ADLs: D  Bathing/Dressing- D  Meal Prep- D  Eating- I  Maintaining continence- I  Transferring/Ambulation- I  Managing Meds- I  Follow up appointments reviewed:  PCP Hospital f/u appt confirmed? No   Specialist Hospital f/u appt confirmed? Yes   Scheduled to see Dr Clayborn Bigness [id number] 2:30  [facility] [id number] 1:30 Are transportation arrangements needed? No  If their condition worsens, is the pt aware to call PCP or go to the Emergency Dept.? Yes Was the patient provided with contact information for the PCP's office or ED? Yes Was to pt encouraged to call back with questions or concerns? Yes  SDOH assessments and interventions completed:   Yes SDOH Interventions Today    Flowsheet Row Most Recent Value  SDOH Interventions   Food Insecurity Interventions Intervention Not Indicated  Housing Interventions  Intervention Not Indicated  Transportation Interventions Intervention Not Indicated       Care Coordination Interventions:  No Care Coordination interventions needed at this time.   Encounter Outcome:  Pt. Visit Completed    [facility] [phone]

## 2022-12-01 NOTE — Patient Outreach (Addendum)
  Care Coordination [facility] Note Transition Care Management Unsuccessful Follow-up Telephone Call  Date of discharge and from where:  [facility] [phone] elevated lft's  Attempts:  1st Attempt  Reason for unsuccessful TCM follow-up call:  Left voice message  [facility] [phone]

## 2022-12-02 ENCOUNTER — Telehealth: Payer: Self-pay | Admitting: Nurse Practitioner

## 2022-12-02 NOTE — Telephone Encounter (Signed)
LVM asking patient to call back to schedule an appointment per Santiago Glad

## 2022-12-02 NOTE — Telephone Encounter (Signed)
Per Santiago Glad, patient needs to be scheduled for an office visit next week.

## 2022-12-02 NOTE — Telephone Encounter (Signed)
Patient had two surgeries last Wednesday and was told by the surgeon to contact her PCP to have post opp blood work done two days after surgery.  Patient is needing to know if is can just come for labs or if she need an appointment Phone number: [phone]     Per patient she need to have the blood work done as soon as possible due to having another surgery that she is going to have to go through.

## 2022-12-02 NOTE — Telephone Encounter (Signed)
Patient called back to get an update on labs. Patient is scheduled for a hospital follow up on 1/23 with Jolene. Please follow up with patient if she can be worked in to the schedule on Monday. Patient was told she needed to have follow up labs on 1/19.

## 2022-12-03 DIAGNOSIS — Z48815 Encounter for surgical aftercare following surgery on the digestive system: Secondary | ICD-10-CM | POA: Diagnosis not present

## 2022-12-03 DIAGNOSIS — Z9049 Acquired absence of other specified parts of digestive tract: Secondary | ICD-10-CM | POA: Diagnosis not present

## 2022-12-03 DIAGNOSIS — N1831 Chronic kidney disease, stage 3a: Secondary | ICD-10-CM | POA: Diagnosis not present

## 2022-12-03 DIAGNOSIS — I2510 Atherosclerotic heart disease of native coronary artery without angina pectoris: Secondary | ICD-10-CM | POA: Diagnosis not present

## 2022-12-03 DIAGNOSIS — I129 Hypertensive chronic kidney disease with stage 1 through stage 4 chronic kidney disease, or unspecified chronic kidney disease: Secondary | ICD-10-CM | POA: Diagnosis not present

## 2022-12-04 DIAGNOSIS — Z9049 Acquired absence of other specified parts of digestive tract: Secondary | ICD-10-CM | POA: Insufficient documentation

## 2022-12-04 NOTE — Patient Instructions (Signed)
Cholecystostomy Cholecystostomy is a procedure to drain fluid from the gallbladder using a flexible drainage tube (catheter). The gallbladder is a pear-shaped organ that lies beneath the liver on the right side of the body. The gallbladder stores a fluid that helps the body digest fats (bile). You may need this procedure: If your gallbladder is irritated and swollen (cholecystitis). This condition may be caused by stones or lumps that form in the gallbladder (gallstones). Gallstones can block the tube (duct) that carries bile out of your gallbladder. To control a gallbladder infection if you cannot have gallbladder surgery. Tell a health care provider about: Any allergies you have. All medicines you are taking, including vitamins, herbs, eye drops, creams, and over-the-counter medicines. Any problems you or family members have had with anesthetic medicines. Any bleeding problems you have. Any surgeries you have had. Any medical conditions you have. Whether you are pregnant or may be pregnant. What are the risks? Generally, this is a safe procedure. However, problems may occur, including: The catheter moving out of place or becoming clogged. Infection at the incision site or inside the abdomen (peritonitis). Internal bleeding. Leakage of bile from the gallbladder. Damage to other structures or organs. Low blood pressure and slowed heart rate. Allergic reactions to medicines or dyes. What happens before the procedure? When to stop eating and drinking Follow instructions from your health care provider about what you may eat and drink before your procedure. These may include: 8 hours before your procedure Stop eating most foods. Do not eat meat, fried foods, or fatty foods. Eat only light foods, such as toast or crackers. All liquids are okay except energy drinks and alcohol. 6 hours before your procedure Stop eating. Drink only clear liquids, such as water, clear fruit juice, black coffee,  plain tea, and sports drinks. Do not drink energy drinks or alcohol. 2 hours before your procedure Stop drinking all liquids. You may be allowed to take medicines with small sips of water. If you do not follow your health care provider's instructions, your procedure may be delayed or canceled. Medicines Ask your health care provider about: Changing or stopping your regular medicines. This is especially important if you are taking diabetes medicines or blood thinners. Taking medicines such as aspirin and ibuprofen. These medicines can thin your blood. Do not take these medicines unless your health care provider tells you to take them. Taking over-the-counter medicines, vitamins, herbs, and supplements. General instructions You may have an exam or testing, including: Imaging studies of your abdomen and gallbladder. Blood tests. Do not use any products that contain nicotine or tobacco. These products include cigarettes, chewing tobacco, and vaping devices, such as e-cigarettes. If you need help quitting, ask your health care provider. If you will be going home right after the procedure, plan to have a responsible adult: Take you home from the hospital or clinic. You will not be allowed to drive. Care for you for the time you are told. Ask your health care provider: How your surgery site will be marked. What steps will be taken to help prevent infection. These steps may include: Removing hair at the surgery site. Washing skin with a germ-killing soap. Taking antibiotic medicine. What happens during the procedure? An IV will be inserted into one of your veins. You will be given one or more of the following: A medicine to help you relax (sedative). A medicine to numb the area (local anesthetic). A medicine to make you fall asleep (general anesthetic). A small incision will  be made in your abdomen, above your gallbladder. A long needle or a wide puncturing tool (trocar) will be put through  the incision. Your health care provider will use an imaging study (ultrasound or CT scan) to guide the needle or trocar into your gallbladder. After the needle or trocar is in your gallbladder, a small amount of dye may be injected. An X-ray may be taken to make sure that the needle is in the correct place. A catheter will be placed into your gallbladder. Your health care provider may remove any gallstones, if they are present. The needle or trocar will be removed. The catheter will be secured to your skin with stitches (sutures). The catheter will be connected to a drainage bag. Bile will drain from the gallbladder into the bag. Some bile may be sent to the lab to be tested. A bandage (dressing) will be placed over the incision site where the catheter was placed. The procedure may vary among health care providers and hospitals. What happens after the procedure? Your blood pressure, heart rate, breathing rate, and blood oxygen level will be monitored until you leave the hospital or clinic. Dye may be injected through your catheter to check the catheter and your gallbladder. Your gallbladder may be flushed out (irrigated) through the catheter. Your catheter and drainage bag may need to stay in place for several weeks or as told by your health care provider. You will be given IV fluids. Summary Cholecystostomy is a procedure to drain bile from the gallbladder using a flexible drainage tube (catheter). Generally, this is a safe procedure. However, problems may occur, including bleeding, infection, clogging or dislodging of the catheter, damage to other structures or organs, or low blood pressure and slowed heart rate. Follow instructions before the procedure. You will be told when to stop all food and drink, whether to change or stop any medicines, and what tests need to be done. After the procedure, you will be monitored in the hospital or clinic, and you may have a catheter and drainage bag when  you go home. This information is not intended to replace advice given to you by your health care provider. Make sure you discuss any questions you have with your health care provider. Document Revised: 05/04/2021 Document Reviewed: 05/04/2021 Elsevier Patient Education  [place].

## 2022-12-05 NOTE — Telephone Encounter (Signed)
Can the patient be worked in anywhere today?

## 2022-12-06 ENCOUNTER — Ambulatory Visit (INDEPENDENT_AMBULATORY_CARE_PROVIDER_SITE_OTHER): Payer: Medicare Other | Admitting: Unknown Physician Specialty

## 2022-12-06 ENCOUNTER — Encounter: Payer: Self-pay | Admitting: Unknown Physician Specialty

## 2022-12-06 VITALS — BP 121/75 | HR 69 | Temp 97.7°F | Ht 62.01 in | Wt 339.0 lb

## 2022-12-06 DIAGNOSIS — D649 Anemia, unspecified: Secondary | ICD-10-CM | POA: Diagnosis not present

## 2022-12-06 DIAGNOSIS — I495 Sick sinus syndrome: Secondary | ICD-10-CM | POA: Diagnosis not present

## 2022-12-06 DIAGNOSIS — I129 Hypertensive chronic kidney disease with stage 1 through stage 4 chronic kidney disease, or unspecified chronic kidney disease: Secondary | ICD-10-CM

## 2022-12-06 DIAGNOSIS — N1832 Chronic kidney disease, stage 3b: Secondary | ICD-10-CM

## 2022-12-06 DIAGNOSIS — Z9049 Acquired absence of other specified parts of digestive tract: Secondary | ICD-10-CM | POA: Diagnosis not present

## 2022-12-06 DIAGNOSIS — E876 Hypokalemia: Secondary | ICD-10-CM

## 2022-12-06 DIAGNOSIS — R7989 Other specified abnormal findings of blood chemistry: Secondary | ICD-10-CM

## 2022-12-06 DIAGNOSIS — R7303 Prediabetes: Secondary | ICD-10-CM

## 2022-12-06 DIAGNOSIS — I10 Essential (primary) hypertension: Secondary | ICD-10-CM

## 2022-12-06 LAB — MICROALBUMIN, URINE WAIVED
Creatinine, Urine Waived: 100 mg/dL (ref 10–300)
Microalb, Ur Waived: 10 mg/L (ref 0–19)
Microalb/Creat Ratio: <30 mg/g (ref <30)

## 2022-12-06 LAB — BAYER DCA HB A1C WAIVED: HB A1C (BAYER DCA - WAIVED): 5.7 % — ABNORMAL HIGH (ref 4.8–5.6)

## 2022-12-06 NOTE — Assessment & Plan Note (Signed)
Followed by cardiology.  Check CMP

## 2022-12-06 NOTE — Assessment & Plan Note (Signed)
Microalbumin was 10.  Continue present medications

## 2022-12-06 NOTE — Assessment & Plan Note (Addendum)
Taking Jardiance but off since surgery. Hgb A1C is 5.7%  Feels it is cost prohibitive but willing to take when she can

## 2022-12-06 NOTE — Assessment & Plan Note (Signed)
Healing well with surgical wounds dry and intact

## 2022-12-06 NOTE — Progress Notes (Signed)
BP 121/75   Pulse 69   Temp 97.7 F (36.5 C) (Oral)   Ht 5' 2.01" (1.575 m)   Wt (!) 339 lb (153.8 kg)   SpO2 99%   BMI 61.99 kg/m    Subjective:    Patient ID: Mary Webb, female    DOB: Dec 04, 1955, 67 y.o.   MRN: [id number]  HPI: Mary Webb is a 67 y.o. female  Chief Complaint  Patient presents with   Hospitalization Follow-up   Admitted to the hospital on 1/11 Discharged on 1/17  Pt with a past medical history of CAD, CKD, CVA x2 presented to the hospital with abdominal pain thought to be related to acute calculus cholecystitis and had a cholecystectomy.  Pt is here for f/u and to check a CMP and CBC  Diabetes: No Jardiance since the day before surgery, however states blood sugars 120s to 130s.  Struggling with the copay.    Hypertension Using medications without difficulty Average home BPs highest is 140s but in general OK  No problems or lightheadedness No chest pain with exertion or shortness of breath No Edema  Hyperlipidemia Using medications without problems: No Muscle aches  Diet compliance: Coming back after being on liquids Exercise: No exercise  Relevant past medical, surgical, family and social history reviewed and updated as indicated. Interim medical history since our last visit reviewed. Allergies and medications reviewed and updated.  Review of Systems  Per HPI unless specifically indicated above     Objective:    BP 121/75   Pulse 69   Temp 97.7 F (36.5 C) (Oral)   Ht 5' 2.01" (1.575 m)   Wt (!) 339 lb (153.8 kg)   SpO2 99%   BMI 61.99 kg/m   Wt Readings from Last 3 Encounters:  12/06/22 (!) 339 lb (153.8 kg)  11/30/22 (!) 360 lb 3.7 oz (163.4 kg)  11/23/22 (!) 340 lb (154.2 kg)    Physical Exam Constitutional:      General: She is not in acute distress.    Appearance: Normal appearance. She is well-developed.  HENT:     Head: Normocephalic and atraumatic.  Eyes:     General: Lids are normal. No scleral  icterus.       Right eye: No discharge.        Left eye: No discharge.     Conjunctiva/sclera: Conjunctivae normal.  Neck:     Vascular: No carotid bruit or JVD.  Cardiovascular:     Rate and Rhythm: Normal rate and regular rhythm.     Heart sounds: Normal heart sounds.  Pulmonary:     Effort: Pulmonary effort is normal. No respiratory distress.     Breath sounds: Normal breath sounds.  Abdominal:     Palpations: There is no hepatomegaly or splenomegaly.  Musculoskeletal:        General: Normal range of motion.     Cervical back: Normal range of motion and neck supple.  Skin:    General: Skin is warm and dry.     Coloration: Skin is not pale.     Findings: No rash.     Comments: Surgical site, clean and dry  Neurological:     Mental Status: She is alert and oriented to person, place, and time.  Psychiatric:        Behavior: Behavior normal.        Thought Content: Thought content normal.        Judgment: Judgment normal.  Assessment & Plan:   Problem List Items Addressed This Visit       Unprioritized   Sick sinus syndrome ([place]) (Chronic)     Followed by cardiology.  Check CMP      Elevated LFTs   Relevant Orders   Comprehensive metabolic panel   Hypertension    Stable, continue present medications.        Relevant Orders   Microalbumin, Urine Waived   Comprehensive metabolic panel   CBC with Differential/Platelet   Hypertensive kidney disease with CKD stage III ([place]) - Primary    Microalbumin was 10.  Continue present medications      Relevant Orders   Microalbumin, Urine Waived   Comprehensive metabolic panel   Morbid obesity ([place])   Prediabetes    Taking Jardiance but off since surgery. Hgb A1C is 5.7%  Feels it is cost prohibitive but willing to take when she can      Relevant Orders   Bayer DCA Hb A1c Waived   Microalbumin, Urine Waived   S/P cholecystectomy    Healing well with surgical wounds dry and intact      Relevant Orders    Comprehensive metabolic panel   Other Visit Diagnoses     Postoperative anemia       check CBC today   Relevant Orders   CBC with Differential/Platelet   Iron Binding Cap (TIBC)([facility])   Ferritin   Hypokalemia       Check labs today.  Probably related to Lasix given in the hospital   Relevant Orders   Comprehensive metabolic panel        Follow up plan: 3 months

## 2022-12-06 NOTE — Assessment & Plan Note (Signed)
Stable, continue present medications.   

## 2022-12-07 ENCOUNTER — Telehealth: Payer: Self-pay | Admitting: Nurse Practitioner

## 2022-12-07 LAB — CBC WITH DIFFERENTIAL/PLATELET
Basophils Absolute: 0.1 10*3/uL (ref 0.0–0.2)
Basos: 1 %
EOS (ABSOLUTE): 0.3 10*3/uL (ref 0.0–0.4)
Eos: 5 %
Hematocrit: 40.8 % (ref 34.0–46.6)
Hemoglobin: 13.6 g/dL (ref 11.1–15.9)
Immature Grans (Abs): 0 10*3/uL (ref 0.0–0.1)
Immature Granulocytes: 0 %
Lymphocytes Absolute: 1.7 10*3/uL (ref 0.7–3.1)
Lymphs: 30 %
MCH: 30.6 pg (ref 26.6–33.0)
MCHC: 33.3 g/dL (ref 31.5–35.7)
MCV: 92 fL (ref 79–97)
Monocytes Absolute: 0.7 10*3/uL (ref 0.1–0.9)
Monocytes: 11 %
Neutrophils Absolute: 3 10*3/uL (ref 1.4–7.0)
Neutrophils: 53 %
Platelets: 388 10*3/uL (ref 150–450)
RBC: 4.45 x10E6/uL (ref 3.77–5.28)
RDW: 12.4 % (ref 11.7–15.4)
WBC: 5.8 10*3/uL (ref 3.4–10.8)

## 2022-12-07 LAB — COMPREHENSIVE METABOLIC PANEL
ALT: 32 IU/L (ref 0–32)
AST: 21 IU/L (ref 0–40)
Albumin/Globulin Ratio: 1.1 — ABNORMAL LOW (ref 1.2–2.2)
Albumin: 4 g/dL (ref 3.9–4.9)
Alkaline Phosphatase: 118 IU/L (ref 44–121)
BUN/Creatinine Ratio: 13 (ref 12–28)
BUN: 13 mg/dL (ref 8–27)
Bilirubin Total: 0.7 mg/dL (ref 0.0–1.2)
CO2: 26 mmol/L (ref 20–29)
Calcium: 9.8 mg/dL (ref 8.7–10.3)
Chloride: 98 mmol/L (ref 96–106)
Creatinine, Ser: 0.99 mg/dL (ref 0.57–1.00)
Globulin, Total: 3.5 g/dL (ref 1.5–4.5)
Glucose: 91 mg/dL (ref 70–99)
Potassium: 4 mmol/L (ref 3.5–5.2)
Sodium: 139 mmol/L (ref 134–144)
Total Protein: 7.5 g/dL (ref 6.0–8.5)
eGFR: 62 mL/min/{1.73_m2} (ref >59)

## 2022-12-07 LAB — IRON AND TIBC
Iron Saturation: 19 % (ref 15–55)
Iron: 59 ug/dL (ref 27–139)
Total Iron Binding Capacity: 318 ug/dL (ref 250–450)
UIBC: 259 ug/dL (ref 118–369)

## 2022-12-07 LAB — FERRITIN: Ferritin: 265 ng/mL — ABNORMAL HIGH (ref 15–150)

## 2022-12-07 NOTE — Telephone Encounter (Signed)
Copied from [place] [id number]. Topic: General - Inquiry >> Dec 07, 2022  2:47 PM Erskine Squibb wrote: Reason for CRM: The patient did some post op blood work yesterday and is at her mother re cooperating. She can be reached there at [phone]. Please assist patient further.

## 2022-12-08 ENCOUNTER — Telehealth: Payer: Self-pay | Admitting: Nurse Practitioner

## 2022-12-08 DIAGNOSIS — Z9049 Acquired absence of other specified parts of digestive tract: Secondary | ICD-10-CM | POA: Diagnosis not present

## 2022-12-08 DIAGNOSIS — N1831 Chronic kidney disease, stage 3a: Secondary | ICD-10-CM | POA: Diagnosis not present

## 2022-12-08 DIAGNOSIS — I129 Hypertensive chronic kidney disease with stage 1 through stage 4 chronic kidney disease, or unspecified chronic kidney disease: Secondary | ICD-10-CM | POA: Diagnosis not present

## 2022-12-08 DIAGNOSIS — Z48815 Encounter for surgical aftercare following surgery on the digestive system: Secondary | ICD-10-CM | POA: Diagnosis not present

## 2022-12-08 DIAGNOSIS — I2510 Atherosclerotic heart disease of native coronary artery without angina pectoris: Secondary | ICD-10-CM | POA: Diagnosis not present

## 2022-12-08 NOTE — Telephone Encounter (Signed)
Copied from [place] [id number]. Topic: General - Other >> Dec 08, 2022  1:21 PM Cyndi Bender wrote: Reason for CRM: Pt called for lab results. Pt requests call back.

## 2022-12-08 NOTE — Telephone Encounter (Signed)
Tried calling patient. No answer and no VM. Will try to call again.

## 2022-12-09 DIAGNOSIS — I129 Hypertensive chronic kidney disease with stage 1 through stage 4 chronic kidney disease, or unspecified chronic kidney disease: Secondary | ICD-10-CM | POA: Diagnosis not present

## 2022-12-09 DIAGNOSIS — I2510 Atherosclerotic heart disease of native coronary artery without angina pectoris: Secondary | ICD-10-CM | POA: Diagnosis not present

## 2022-12-09 DIAGNOSIS — Z48815 Encounter for surgical aftercare following surgery on the digestive system: Secondary | ICD-10-CM | POA: Diagnosis not present

## 2022-12-09 DIAGNOSIS — Z9049 Acquired absence of other specified parts of digestive tract: Secondary | ICD-10-CM | POA: Diagnosis not present

## 2022-12-09 DIAGNOSIS — N1831 Chronic kidney disease, stage 3a: Secondary | ICD-10-CM | POA: Diagnosis not present

## 2022-12-12 NOTE — Telephone Encounter (Signed)
Tried calling patient, no answer and no VM.

## 2022-12-13 DIAGNOSIS — Z48815 Encounter for surgical aftercare following surgery on the digestive system: Secondary | ICD-10-CM | POA: Diagnosis not present

## 2022-12-13 DIAGNOSIS — I129 Hypertensive chronic kidney disease with stage 1 through stage 4 chronic kidney disease, or unspecified chronic kidney disease: Secondary | ICD-10-CM | POA: Diagnosis not present

## 2022-12-13 DIAGNOSIS — I2510 Atherosclerotic heart disease of native coronary artery without angina pectoris: Secondary | ICD-10-CM | POA: Diagnosis not present

## 2022-12-13 DIAGNOSIS — N1831 Chronic kidney disease, stage 3a: Secondary | ICD-10-CM | POA: Diagnosis not present

## 2022-12-13 DIAGNOSIS — Z9049 Acquired absence of other specified parts of digestive tract: Secondary | ICD-10-CM | POA: Diagnosis not present

## 2022-12-16 DIAGNOSIS — Z48815 Encounter for surgical aftercare following surgery on the digestive system: Secondary | ICD-10-CM | POA: Diagnosis not present

## 2022-12-16 DIAGNOSIS — Z9049 Acquired absence of other specified parts of digestive tract: Secondary | ICD-10-CM | POA: Diagnosis not present

## 2022-12-16 DIAGNOSIS — I129 Hypertensive chronic kidney disease with stage 1 through stage 4 chronic kidney disease, or unspecified chronic kidney disease: Secondary | ICD-10-CM | POA: Diagnosis not present

## 2022-12-16 DIAGNOSIS — N1831 Chronic kidney disease, stage 3a: Secondary | ICD-10-CM | POA: Diagnosis not present

## 2022-12-16 DIAGNOSIS — I2510 Atherosclerotic heart disease of native coronary artery without angina pectoris: Secondary | ICD-10-CM | POA: Diagnosis not present

## 2022-12-17 DIAGNOSIS — N1831 Chronic kidney disease, stage 3a: Secondary | ICD-10-CM | POA: Diagnosis not present

## 2022-12-17 DIAGNOSIS — I2510 Atherosclerotic heart disease of native coronary artery without angina pectoris: Secondary | ICD-10-CM | POA: Diagnosis not present

## 2022-12-17 DIAGNOSIS — Z48815 Encounter for surgical aftercare following surgery on the digestive system: Secondary | ICD-10-CM | POA: Diagnosis not present

## 2022-12-17 DIAGNOSIS — I129 Hypertensive chronic kidney disease with stage 1 through stage 4 chronic kidney disease, or unspecified chronic kidney disease: Secondary | ICD-10-CM | POA: Diagnosis not present

## 2022-12-17 DIAGNOSIS — Z9049 Acquired absence of other specified parts of digestive tract: Secondary | ICD-10-CM | POA: Diagnosis not present

## 2022-12-19 DIAGNOSIS — Z9049 Acquired absence of other specified parts of digestive tract: Secondary | ICD-10-CM | POA: Diagnosis not present

## 2022-12-19 DIAGNOSIS — N1831 Chronic kidney disease, stage 3a: Secondary | ICD-10-CM | POA: Diagnosis not present

## 2022-12-19 DIAGNOSIS — Z950 Presence of cardiac pacemaker: Secondary | ICD-10-CM | POA: Diagnosis not present

## 2022-12-19 DIAGNOSIS — I509 Heart failure, unspecified: Secondary | ICD-10-CM | POA: Diagnosis not present

## 2022-12-19 DIAGNOSIS — Z48815 Encounter for surgical aftercare following surgery on the digestive system: Secondary | ICD-10-CM | POA: Diagnosis not present

## 2022-12-19 DIAGNOSIS — Z45010 Encounter for checking and testing of cardiac pacemaker pulse generator [battery]: Secondary | ICD-10-CM | POA: Diagnosis not present

## 2022-12-19 DIAGNOSIS — Z01818 Encounter for other preprocedural examination: Secondary | ICD-10-CM | POA: Diagnosis not present

## 2022-12-19 DIAGNOSIS — I2510 Atherosclerotic heart disease of native coronary artery without angina pectoris: Secondary | ICD-10-CM | POA: Diagnosis not present

## 2022-12-19 DIAGNOSIS — I129 Hypertensive chronic kidney disease with stage 1 through stage 4 chronic kidney disease, or unspecified chronic kidney disease: Secondary | ICD-10-CM | POA: Diagnosis not present

## 2022-12-19 DIAGNOSIS — Z8679 Personal history of other diseases of the circulatory system: Secondary | ICD-10-CM | POA: Diagnosis not present

## 2022-12-19 DIAGNOSIS — N183 Chronic kidney disease, stage 3 unspecified: Secondary | ICD-10-CM | POA: Diagnosis not present

## 2022-12-21 DIAGNOSIS — I2510 Atherosclerotic heart disease of native coronary artery without angina pectoris: Secondary | ICD-10-CM | POA: Diagnosis not present

## 2022-12-21 DIAGNOSIS — Z48815 Encounter for surgical aftercare following surgery on the digestive system: Secondary | ICD-10-CM | POA: Diagnosis not present

## 2022-12-21 DIAGNOSIS — Z9049 Acquired absence of other specified parts of digestive tract: Secondary | ICD-10-CM | POA: Diagnosis not present

## 2022-12-21 DIAGNOSIS — N1831 Chronic kidney disease, stage 3a: Secondary | ICD-10-CM | POA: Diagnosis not present

## 2022-12-21 DIAGNOSIS — I129 Hypertensive chronic kidney disease with stage 1 through stage 4 chronic kidney disease, or unspecified chronic kidney disease: Secondary | ICD-10-CM | POA: Diagnosis not present

## 2022-12-22 DIAGNOSIS — I129 Hypertensive chronic kidney disease with stage 1 through stage 4 chronic kidney disease, or unspecified chronic kidney disease: Secondary | ICD-10-CM | POA: Diagnosis not present

## 2022-12-22 DIAGNOSIS — Z9049 Acquired absence of other specified parts of digestive tract: Secondary | ICD-10-CM | POA: Diagnosis not present

## 2022-12-22 DIAGNOSIS — Z48815 Encounter for surgical aftercare following surgery on the digestive system: Secondary | ICD-10-CM | POA: Diagnosis not present

## 2022-12-22 DIAGNOSIS — I2510 Atherosclerotic heart disease of native coronary artery without angina pectoris: Secondary | ICD-10-CM | POA: Diagnosis not present

## 2022-12-22 DIAGNOSIS — N1831 Chronic kidney disease, stage 3a: Secondary | ICD-10-CM | POA: Diagnosis not present

## 2022-12-23 DIAGNOSIS — Z9049 Acquired absence of other specified parts of digestive tract: Secondary | ICD-10-CM | POA: Diagnosis not present

## 2022-12-23 DIAGNOSIS — N1831 Chronic kidney disease, stage 3a: Secondary | ICD-10-CM | POA: Diagnosis not present

## 2022-12-23 DIAGNOSIS — I2510 Atherosclerotic heart disease of native coronary artery without angina pectoris: Secondary | ICD-10-CM | POA: Diagnosis not present

## 2022-12-23 DIAGNOSIS — I129 Hypertensive chronic kidney disease with stage 1 through stage 4 chronic kidney disease, or unspecified chronic kidney disease: Secondary | ICD-10-CM | POA: Diagnosis not present

## 2022-12-23 DIAGNOSIS — Z48815 Encounter for surgical aftercare following surgery on the digestive system: Secondary | ICD-10-CM | POA: Diagnosis not present

## 2022-12-26 ENCOUNTER — Other Ambulatory Visit: Payer: Self-pay | Admitting: Nurse Practitioner

## 2022-12-26 NOTE — Progress Notes (Signed)
Patient contacted and scheduled for labs as requested.

## 2022-12-27 DIAGNOSIS — Z48815 Encounter for surgical aftercare following surgery on the digestive system: Secondary | ICD-10-CM | POA: Diagnosis not present

## 2022-12-27 DIAGNOSIS — I495 Sick sinus syndrome: Secondary | ICD-10-CM | POA: Diagnosis not present

## 2022-12-27 DIAGNOSIS — I129 Hypertensive chronic kidney disease with stage 1 through stage 4 chronic kidney disease, or unspecified chronic kidney disease: Secondary | ICD-10-CM | POA: Diagnosis not present

## 2022-12-27 DIAGNOSIS — Z9049 Acquired absence of other specified parts of digestive tract: Secondary | ICD-10-CM | POA: Diagnosis not present

## 2022-12-27 DIAGNOSIS — N1831 Chronic kidney disease, stage 3a: Secondary | ICD-10-CM | POA: Diagnosis not present

## 2022-12-27 DIAGNOSIS — I2510 Atherosclerotic heart disease of native coronary artery without angina pectoris: Secondary | ICD-10-CM | POA: Diagnosis not present

## 2022-12-27 NOTE — Telephone Encounter (Signed)
Requested Prescriptions  Pending Prescriptions Disp Refills   diclofenac Sodium (VOLTAREN) 1 % GEL [Pharmacy Med Name: DICLOFENAC SODIUM 1% GEL] 100 g 2    Sig: Apply 2 g topically 4 (four) times daily.     Analgesics:  Topicals Failed - 12/27/2022  1:37 PM      Failed - Manual Review: Labs are only required if the patient has taken medication for more than 8 weeks.      Passed - PLT in normal range and within 360 days    Platelets  Date Value Ref Range Status  12/06/2022 388 150 - 450 x10E3/uL Final         Passed - HGB in normal range and within 360 days    Hemoglobin  Date Value Ref Range Status  12/06/2022 13.6 11.1 - 15.9 g/dL Final         Passed - HCT in normal range and within 360 days    Hematocrit  Date Value Ref Range Status  12/06/2022 40.8 34.0 - 46.6 % Final         Passed - Cr in normal range and within 360 days    Creatinine  Date Value Ref Range Status  07/12/2014 1.37 (H) 0.60 - 1.30 mg/dL Final   Creatinine, Ser  Date Value Ref Range Status  12/06/2022 0.99 0.57 - 1.00 mg/dL Final         Passed - eGFR is 30 or above and within 360 days    EGFR (African American)  Date Value Ref Range Status  07/12/2014 49 (L)  Final   GFR calc Af Amer  Date Value Ref Range Status  12/29/2020 59 (L) >59 mL/min/1.73 Final    Comment:    **In accordance with recommendations from the NKF-ASN Task force,**   [facility] is in the process of updating its eGFR calculation to the   2021 CKD-EPI creatinine equation that estimates kidney function   without a race variable.    EGFR (Non-African Amer.)  Date Value Ref Range Status  07/12/2014 42 (L)  Final    Comment:    eGFR values <16m/min/1.73 m2 may be an indication of chronic kidney disease (CKD). Calculated eGFR is useful in patients with stable renal function. The eGFR calculation will not be reliable in acutely ill patients when serum creatinine is changing rapidly. It is not useful in  patients on dialysis.  The eGFR calculation may not be applicable to patients at the low and high extremes of body sizes, pregnant women, and vegetarians.    GFR, Estimated  Date Value Ref Range Status  11/30/2022 >60 >60 mL/min Final    Comment:    (NOTE) Calculated using the CKD-EPI Creatinine Equation (2021)    eGFR  Date Value Ref Range Status  12/06/2022 62 >59 mL/min/1.73 Final         Passed - Patient is not pregnant      Passed - Valid encounter within last 12 months    Recent Outpatient Visits           3 weeks ago Hypertensive kidney disease with stage 3b chronic kidney disease ([place]   [place] Hadd[place] NP   3 months ago [place]   5 months ago Pituitary adenoma ([facility]   [place] NP   11 months ago Annual physical exam   [place]  Santiago Glad, NP   1 year ago Hypertensive kidney disease with stage 3b chronic kidney disease ([place])   [place] [name], NP       Future Appointments             In 4 weeks [name], NP [place], [facility]

## 2022-12-27 NOTE — Telephone Encounter (Signed)
Mary Webb pt husband is calling back to check on the medication. Advised that the request has been sent to PCP. Mary Webb states that the pt is out of the medication and is in a lot of pain.  Please advise

## 2022-12-30 DIAGNOSIS — N1831 Chronic kidney disease, stage 3a: Secondary | ICD-10-CM | POA: Diagnosis not present

## 2022-12-30 DIAGNOSIS — I129 Hypertensive chronic kidney disease with stage 1 through stage 4 chronic kidney disease, or unspecified chronic kidney disease: Secondary | ICD-10-CM | POA: Diagnosis not present

## 2022-12-30 DIAGNOSIS — Z9049 Acquired absence of other specified parts of digestive tract: Secondary | ICD-10-CM | POA: Diagnosis not present

## 2022-12-30 DIAGNOSIS — I2510 Atherosclerotic heart disease of native coronary artery without angina pectoris: Secondary | ICD-10-CM | POA: Diagnosis not present

## 2022-12-30 DIAGNOSIS — Z48815 Encounter for surgical aftercare following surgery on the digestive system: Secondary | ICD-10-CM | POA: Diagnosis not present

## 2022-12-31 DIAGNOSIS — I129 Hypertensive chronic kidney disease with stage 1 through stage 4 chronic kidney disease, or unspecified chronic kidney disease: Secondary | ICD-10-CM | POA: Diagnosis not present

## 2022-12-31 DIAGNOSIS — Z8673 Personal history of transient ischemic attack (TIA), and cerebral infarction without residual deficits: Secondary | ICD-10-CM | POA: Diagnosis not present

## 2022-12-31 DIAGNOSIS — N183 Chronic kidney disease, stage 3 unspecified: Secondary | ICD-10-CM | POA: Diagnosis not present

## 2022-12-31 DIAGNOSIS — E785 Hyperlipidemia, unspecified: Secondary | ICD-10-CM | POA: Diagnosis not present

## 2023-01-02 ENCOUNTER — Other Ambulatory Visit: Payer: Self-pay | Admitting: Nurse Practitioner

## 2023-01-02 DIAGNOSIS — I10 Essential (primary) hypertension: Secondary | ICD-10-CM

## 2023-01-02 DIAGNOSIS — I129 Hypertensive chronic kidney disease with stage 1 through stage 4 chronic kidney disease, or unspecified chronic kidney disease: Secondary | ICD-10-CM

## 2023-01-03 DIAGNOSIS — Z48815 Encounter for surgical aftercare following surgery on the digestive system: Secondary | ICD-10-CM | POA: Diagnosis not present

## 2023-01-03 DIAGNOSIS — N1831 Chronic kidney disease, stage 3a: Secondary | ICD-10-CM | POA: Diagnosis not present

## 2023-01-03 DIAGNOSIS — Z9049 Acquired absence of other specified parts of digestive tract: Secondary | ICD-10-CM | POA: Diagnosis not present

## 2023-01-03 DIAGNOSIS — I129 Hypertensive chronic kidney disease with stage 1 through stage 4 chronic kidney disease, or unspecified chronic kidney disease: Secondary | ICD-10-CM | POA: Diagnosis not present

## 2023-01-03 DIAGNOSIS — I2510 Atherosclerotic heart disease of native coronary artery without angina pectoris: Secondary | ICD-10-CM | POA: Diagnosis not present

## 2023-01-03 NOTE — Telephone Encounter (Signed)
Requested Prescriptions  Pending Prescriptions Disp Refills   losartan (COZAAR) 100 MG tablet [Pharmacy Med Name: LOSARTAN POTASSIUM 100 MG TAB] 90 tablet 0    Sig: Take 1 tablet (100 mg total) by mouth daily.     Cardiovascular:  Angiotensin Receptor Blockers Passed - 01/02/2023  7:23 AM      Passed - Cr in normal range and within 180 days    Creatinine  Date Value Ref Range Status  07/12/2014 1.37 (H) 0.60 - 1.30 mg/dL Final   Creatinine, Ser  Date Value Ref Range Status  12/06/2022 0.99 0.57 - 1.00 mg/dL Final         Passed - K in normal range and within 180 days    Potassium  Date Value Ref Range Status  12/06/2022 4.0 3.5 - 5.2 mmol/L Final  07/12/2014 3.4 (L) 3.5 - 5.1 mmol/L Final         Passed - Patient is not pregnant      Passed - Last BP in normal range    BP Readings from Last 1 Encounters:  12/06/22 121/75         Passed - Valid encounter within last 6 months    Recent Outpatient Visits           4 weeks ago 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)   [place] Kathrine Haddock, NP   3 months ago History of [place], Henrine Screws T, NP   5 months ago Pituitary adenoma [facility])   [place] [name], NP   11 months ago Annual physical exam   [place] [name], NP   1 year ago Hypertensive kidney disease with stage 3b chronic kidney disease ([place])   [place] [name], NP       Future Appointments             In 3 weeks [name], NP [place], [facility]

## 2023-01-05 DIAGNOSIS — N1831 Chronic kidney disease, stage 3a: Secondary | ICD-10-CM | POA: Diagnosis not present

## 2023-01-05 DIAGNOSIS — Z9049 Acquired absence of other specified parts of digestive tract: Secondary | ICD-10-CM | POA: Diagnosis not present

## 2023-01-05 DIAGNOSIS — Z48815 Encounter for surgical aftercare following surgery on the digestive system: Secondary | ICD-10-CM | POA: Diagnosis not present

## 2023-01-05 DIAGNOSIS — I2510 Atherosclerotic heart disease of native coronary artery without angina pectoris: Secondary | ICD-10-CM | POA: Diagnosis not present

## 2023-01-05 DIAGNOSIS — I129 Hypertensive chronic kidney disease with stage 1 through stage 4 chronic kidney disease, or unspecified chronic kidney disease: Secondary | ICD-10-CM | POA: Diagnosis not present

## 2023-01-06 ENCOUNTER — Telehealth: Payer: Self-pay | Admitting: Nurse Practitioner

## 2023-01-06 NOTE — Telephone Encounter (Signed)
Copied from [place] [id number]. Topic: Medicare AWV >> Jan 06, 2023  2:38 PM Devoria Glassing wrote: Reason for CRM: Called patient to schedule Medicare Annual Wellness Visit (AWV). Left message for patient to call back and schedule Medicare Annual Wellness Visit (AWV).  Last date of AWV: 01/24/2022  Please schedule an appointment at any time with Kirke Shaggy, NHA     Thank you ,  Sherol Dade; [place]: [phone]

## 2023-01-11 ENCOUNTER — Telehealth: Payer: Self-pay | Admitting: Nurse Practitioner

## 2023-01-11 NOTE — Telephone Encounter (Signed)
Patient called to verify her OV for physical. Patient states that is scheduled to have surgery 01/17/23, but should be able to make it to her OV 01/24/23. Patient states she will call if she can not make it to her physical 01/24/23. Patient stated that she wanted make sure her provider is aware of her surgery.

## 2023-01-12 NOTE — Telephone Encounter (Signed)
That is no problem.  I have been able to follow her progress and surgery.  I recommend we push out her physical so she doesn't have to come so close after surgery.  I can refill her medications to make sure she doesn't run out.

## 2023-01-12 NOTE — Telephone Encounter (Signed)
Please reach out to patient and reschedule her physical per Santiago Glad so patient can recover from surgery.

## 2023-01-12 NOTE — Telephone Encounter (Signed)
Called and left message to call office back to get her physical rescheduled a little bit after her surgery, so that she will have time to recover following the surgery.  Also will put in CRM.

## 2023-01-17 ENCOUNTER — Other Ambulatory Visit: Payer: Self-pay

## 2023-01-17 ENCOUNTER — Ambulatory Visit
Admission: RE | Admit: 2023-01-17 | Discharge: 2023-01-17 | Disposition: A | Discharge status: Home / Self Care | Payer: Medicare Other | Attending: Cardiology | Admitting: Cardiology

## 2023-01-17 ENCOUNTER — Encounter
Admission: RE | Disposition: A | Discharge status: Home / Self Care | Payer: Self-pay | Source: Home / Self Care | Attending: Cardiology

## 2023-01-17 ENCOUNTER — Encounter: Payer: Self-pay | Admitting: Cardiology

## 2023-01-17 DIAGNOSIS — Z45010 Encounter for checking and testing of cardiac pacemaker pulse generator [battery]: Secondary | ICD-10-CM | POA: Diagnosis not present

## 2023-01-17 DIAGNOSIS — T82111A Breakdown (mechanical) of cardiac pulse generator (battery), initial encounter: Secondary | ICD-10-CM | POA: Diagnosis not present

## 2023-01-17 DIAGNOSIS — Z01818 Encounter for other preprocedural examination: Secondary | ICD-10-CM

## 2023-01-17 HISTORY — PX: PPM GENERATOR CHANGEOUT: EP1233

## 2023-01-17 LAB — GLUCOSE, CAPILLARY: Glucose-Capillary: 122 mg/dL — ABNORMAL HIGH (ref 70–99)

## 2023-01-17 SURGERY — PPM GENERATOR CHANGEOUT
Anesthesia: Moderate Sedation

## 2023-01-17 MED ORDER — HEPARIN (PORCINE) IN NACL 1000-0.9 UT/500ML-% IV SOLN
INTRAVENOUS | Status: AC
  Filled 2023-01-17: qty 500

## 2023-01-17 MED ORDER — SODIUM CHLORIDE 0.9 % IV SOLN: INTRAVENOUS | Status: DC

## 2023-01-17 MED ORDER — ONDANSETRON HCL 4 MG/2ML IJ SOLN: 4.0000 mg | Freq: Four times a day (QID) | INTRAMUSCULAR | Status: DC | PRN

## 2023-01-17 MED ORDER — VANCOMYCIN HCL IN DEXTROSE 1-5 GM/200ML-% IV SOLN
INTRAVENOUS | Status: AC
  Administered 2023-01-17: 1000 mg
  Filled 2023-01-17: qty 200

## 2023-01-17 MED ORDER — CEFAZOLIN SODIUM-DEXTROSE 2-3 GM-%(50ML) IV SOLR: INTRAVENOUS | Status: DC | PRN

## 2023-01-17 MED ORDER — FENTANYL CITRATE (PF) 100 MCG/2ML IJ SOLN
INTRAMUSCULAR | Status: AC
  Filled 2023-01-17: qty 2

## 2023-01-17 MED ORDER — MIDAZOLAM HCL 2 MG/2ML IJ SOLN
INTRAMUSCULAR | Status: AC
  Filled 2023-01-17: qty 2

## 2023-01-17 MED ORDER — MIDAZOLAM HCL 2 MG/2ML IJ SOLN
INTRAMUSCULAR | Status: DC | PRN
  Administered 2023-01-17: 1 mg via INTRAVENOUS

## 2023-01-17 MED ORDER — VANCOMYCIN HCL IN DEXTROSE 1-5 GM/200ML-% IV SOLN: 1000.0000 mg | INTRAVENOUS | Status: DC

## 2023-01-17 MED ORDER — FENTANYL CITRATE (PF) 100 MCG/2ML IJ SOLN
INTRAMUSCULAR | Status: DC | PRN
  Administered 2023-01-17: 25 ug via INTRAVENOUS

## 2023-01-17 MED ORDER — SODIUM CHLORIDE 0.9 % IV SOLN
80.0000 mg | INTRAVENOUS | Status: DC
  Filled 2023-01-17: qty 2

## 2023-01-17 MED ORDER — LIDOCAINE HCL (PF) 1 % IJ SOLN
INTRAMUSCULAR | Status: DC | PRN
  Administered 2023-01-17: 30 mL

## 2023-01-17 MED ORDER — LIDOCAINE HCL 1 % IJ SOLN
INTRAMUSCULAR | Status: AC
  Filled 2023-01-17: qty 60

## 2023-01-17 MED ORDER — SODIUM CHLORIDE 0.9 % IV SOLN
INTRAVENOUS | Status: DC | PRN
  Administered 2023-01-17: 80 mg

## 2023-01-17 MED ORDER — ACETAMINOPHEN 325 MG PO TABS: 325.0000 mg | ORAL_TABLET | ORAL | Status: DC | PRN

## 2023-01-17 MED ORDER — HEPARIN (PORCINE) IN NACL 1000-0.9 UT/500ML-% IV SOLN
INTRAVENOUS | Status: DC | PRN
  Administered 2023-01-17: 500 mL

## 2023-01-17 SURGICAL SUPPLY — 14 items
CABLE SURG 12 DISP A/V CHANNEL (MISCELLANEOUS) IMPLANT
DEVICE DSSCT PLSMBLD 3.0S LGHT (MISCELLANEOUS) IMPLANT
IPG PACE AZUR XT DR MRI W1DR01 (Pacemaker) IMPLANT
KIT WRENCH (KITS) IMPLANT
PACE AZURE XT DR MRI W1DR01 (Pacemaker) ×1 IMPLANT
PAD ELECT DEFIB RADIOL ZOLL (MISCELLANEOUS) IMPLANT
PLASMABLADE 3.0S W/LIGHT (MISCELLANEOUS) ×1
POUCH AIGIS-R ANTIBACT PPM (Mesh General) ×1 IMPLANT
POUCH AIGIS-R ANTIBACT PPM MED (Mesh General) IMPLANT
SUT VIC AB 2-0 CT2 27 (SUTURE) IMPLANT
SUT VICRYL 4-0  27 PS-2 BARIAT (SUTURE) ×1
SUT VICRYL 4-0 27 PS-2 BARIAT (SUTURE) ×1
SUTURE VICRYL 4-0 27 PS-2 BART (SUTURE) IMPLANT
TRAY PACEMAKER INSERTION (PACKS) ×1 IMPLANT

## 2023-01-17 NOTE — Discharge Instructions (Addendum)
Patient may shower 01/19/2023 and remove outer bandage.  Leave Steri-Strips on.

## 2023-01-23 NOTE — Progress Notes (Unsigned)
There were no vitals taken for this visit.   Subjective:    Patient ID: Mary Webb, female    DOB: 20-Feb-1956, 67 y.o.   MRN: [id number]  HPI: Mary Webb is a 67 y.o. female presenting on 01/24/2023 for comprehensive medical examination. Current medical complaints include:none  She currently lives with: Menopausal Symptoms: no  Patient states she has been having some back pain and lower abdominal pain.  It has been going on this week.  Seems better today.  She took tylenol yesterday which helped a little bit with the pain.    HYPERTENSION / HYPERLIPIDEMIA Satisfied with current treatment? yes Duration of hypertension: years BP monitoring frequency: daily BP range: 130/75 BP medication side effects: no Past BP meds: chlorthalidone and losartan (cozaar) Duration of hyperlipidemia: years Cholesterol medication side effects: no Cholesterol supplements: none Past cholesterol medications: none Medication compliance: excellent compliance Aspirin: no Recent stressors: no Recurrent headaches: no Visual changes: no Palpitations: no Dyspnea: no Chest pain: no Lower extremity edema: no Dizzy/lightheaded: no  CHRONIC KIDNEY DISEASE CKD status: controlled Medications renally dose: yes Previous renal evaluation: no Pneumovax:  Up to Date Influenza Vaccine:  Not up to Date  Depression Screen done today and results listed below:     12/06/2022    3:46 PM 07/25/2022    9:45 AM 01/24/2022   10:53 AM 01/21/2022    1:18 PM 09/08/2021   10:12 AM  Depression screen PHQ 2/9  Decreased Interest 0 0 1 0 0  Down, Depressed, Hopeless 0 0 0 0 0  PHQ - 2 Score 0 0 1 0 0  Altered sleeping 0 0 2 0   Tired, decreased energy 0 0 1 0   Change in appetite 0 0 0 0   Feeling bad or failure about yourself  0 0 0 0   Trouble concentrating 0 0 0 0   Moving slowly or fidgety/restless 0 0 0 0   Suicidal thoughts 0 0 0 0   PHQ-9 Score 0 0 4 0   Difficult doing work/chores Not difficult  at all Not difficult at all Not difficult at all Not difficult at all     The patient does not have a history of falls. I did complete a risk assessment for falls. A plan of care for falls was documented.   Past Medical History:  Past Medical History:  Diagnosis Date  . Arthritis   . CAD (coronary artery disease)   . CKD (chronic kidney disease)   . CVA (cerebral infarction)    L sided  . Dysrhythmia   . Hyperlipidemia   . Lymphadenopathy   . Nausea and vomiting 09/15/2022  . Pituitary adenoma ([place])   . Presence of permanent cardiac pacemaker   . Recurrent depression ([place])   . Shortness of breath dyspnea   . Sick sinus syndrome ([place])   . Stroke [facility])     Surgical History:  Past Surgical History:  Procedure Laterality Date  . ABDOMINAL HYSTERECTOMY    . CHOLECYSTECTOMY N/A 11/27/2022   Procedure: LAPAROSCOPIC CHOLECYSTECTOMY WITH INTRAOPERATIVE CHOLANGIOGRAM;  Surgeon: Ralene Ok, MD;  Location: [facility];  Service: General;  Laterality: N/A;  . COLONOSCOPY WITH PROPOFOL N/A 01/05/2016   Procedure: COLONOSCOPY WITH PROPOFOL;  Surgeon: Lucilla Lame, MD;  Location: [facility] ENDOSCOPY;  Service: Endoscopy;  Laterality: N/A;  . ESOPHAGOGASTRODUODENOSCOPY (EGD) WITH PROPOFOL N/A 11/26/2022   Procedure: ESOPHAGOGASTRODUODENOSCOPY (EGD) WITH PROPOFOL;  Surgeon: Carol Ada, MD;  Location: [place];  Service: Gastroenterology;  Laterality: N/A;  . EUS N/A 11/26/2022   Procedure: UPPER ENDOSCOPIC ULTRASOUND (EUS) LINEAR;  Surgeon: Carol Ada, MD;  Location: [place];  Service: Gastroenterology;  Laterality: N/A;  . PACEMAKER INSERTION  2015   Dr. Josefa Half  . PPM GENERATOR CHANGEOUT N/A 01/17/2023   Procedure: PPM GENERATOR CHANGEOUT;  Surgeon: Isaias Cowman, MD;  Location: [place] CV [facility];  Service: Cardiovascular;  Laterality: N/A;    Medications:  Current Outpatient Medications on File Prior to Visit  Medication Sig  . Accu-Chek Softclix Lancets lancets Use to  check blood sugar 3 times a day and document for visits. Goal is <130 fasting and <180 two hours after meal.,  . acetaminophen (TYLENOL) 500 MG tablet Take 2 tablets (1,000 mg total) by mouth every 6 (six) hours as needed.  . Ascorbic Acid (VITAMIN C) 1000 MG tablet Take 1,000 mg by mouth 2 (two) times a week.  . Blood Glucose Monitoring Suppl (ACCU-CHEK AVIVA PLUS) w/Device KIT Use to check blood sugar 3 times a day and document for visits.  Goal is <130 fasting and <180 two hours after meal.  . chlorthalidone (HYGROTON) 25 MG tablet Take 1 tablet (25 mg total) by mouth daily.  . Cholecalciferol (VITAMIN D3) 1000 units CAPS Take 1,000 Units by mouth 3 (three) times a week.  . clopidogrel (PLAVIX) 75 MG tablet Take 1 tablet (75 mg total) by mouth daily.  . Cyanocobalamin (VITAMIN B 12) 500 MCG TABS Take 500 mcg by mouth daily.  . diclofenac Sodium (VOLTAREN) 1 % GEL Apply 2 g topically 4 (four) times daily.  . fluticasone furoate-vilanterol (BREO ELLIPTA) 100-25 MCG/INH AEPB Inhale 1 puff into the lungs daily.  Marland Kitchen glucose blood (ACCU-CHEK GUIDE) test strip Use to check blood sugar 3 times a day and document for visits. Goal is <130 fasting and <180 two hours after meal.,  . JARDIANCE 10 MG TABS tablet Take 1 tablet (10 mg total) by mouth daily.  Marland Kitchen losartan (COZAAR) 100 MG tablet Take 1 tablet (100 mg total) by mouth daily.  . potassium chloride SA (KLOR-CON M) 20 MEQ tablet Take 1 tablet (20 mEq total) by mouth 2 (two) times daily.  Marland Kitchen pyridOXINE (VITAMIN B-6) 25 MG tablet Take by mouth daily.   . traMADol (ULTRAM) 50 MG tablet Take 1-2 tablets (50-100 mg total) by mouth every 6 (six) hours as needed for moderate pain or severe pain.   No current facility-administered medications on file prior to visit.    Allergies:  Allergies  Allergen Reactions  . Elemental Sulfur Anaphylaxis  . Levaquin [Levofloxacin] Other (See Comments)    Stroke  . Tekturna [Aliskiren] Shortness Of Breath  . Accupril  [Quinapril Hcl]   . Aspirin   . Diovan [Valsartan]   . Erythromycin   . Iodine Hives  . Latex     Can't remember reaction   . Lotensin [Benazepril]   . Norvasc [Amlodipine]   . Shellfish Allergy Swelling  . Sulfa Antibiotics   . Tetanus Toxoids   . Tetracycline     Other reaction(s): Unknown  . Tetracyclines & Related   . Penicillins Rash    Social History:  Social History   Socioeconomic History  . Marital status: Married    Spouse name: Not on file  . Number of children: Not on file  . Years of education: Not on file  . Highest education level: Not on file  Occupational History  . Not on file  Tobacco Use  . Smoking status:  Never  . Smokeless tobacco: Never  Vaping Use  . Vaping Use: Never used  Substance and Sexual Activity  . Alcohol use: No  . Drug use: No  . Sexual activity: Not on file  Other Topics Concern  . Not on file  Social History Narrative  . Not on file   Social Determinants of Health   Financial Resource Strain: Low Risk  (01/24/2022)   Overall Financial Resource Strain (CARDIA)   . Difficulty of Paying Living Expenses: Not hard at all  Food Insecurity: No Food Insecurity (12/01/2022)   Hunger Vital Sign   . Worried About Charity fundraiser in the Last Year: Never true   . Ran Out of Food in the Last Year: Never true  Transportation Needs: No Transportation Needs (12/01/2022)   PRAPARE - Transportation   . Lack of Transportation (Medical): No   . Lack of Transportation (Non-Medical): No  Physical Activity: Inactive (01/24/2022)   Exercise Vital Sign   . Days of Exercise per Week: 0 days   . Minutes of Exercise per Session: 0 min  Stress: No Stress Concern Present (01/24/2022)   [employer]   . Feeling of Stress : Not at all  Social Connections: Moderately Isolated (01/24/2022)   Social Connection and Isolation Panel [NHANES]   . Frequency of Communication with Friends and  Family: More than three times a week   . Frequency of Social Gatherings with Friends and Family: Once a week   . Attends Religious Services: Never   . Active Member of Clubs or Organizations: No   . Attends Archivist Meetings: Never   . Marital Status: Married  Human resources officer Violence: Not At Risk (01/24/2022)   Humiliation, Afraid, Rape, and Kick questionnaire   . Fear of Current or Ex-Partner: No   . Emotionally Abused: No   . Physically Abused: No   . Sexually Abused: No   Social History   Tobacco Use  Smoking Status Never  Smokeless Tobacco Never   Social History   Substance and Sexual Activity  Alcohol Use No    Family History:  Family History  Problem Relation Age of Onset  . Hypertension Mother   . Hypertension Father   . Diabetes Father        under control  . Hypertension Maternal Grandmother   . Depression Maternal Grandmother   . Hypertension Maternal Grandfather   . Hypertension Paternal Grandmother   . Hypertension Paternal Grandfather   . Thyroid disease Maternal Aunt     Past medical history, surgical history, medications, allergies, family history and social history reviewed with patient today and changes made to appropriate areas of the chart.   Review of Systems  Eyes:  Negative for blurred vision and double vision.  Respiratory:  Negative for shortness of breath.   Cardiovascular:  Negative for chest pain, palpitations and leg swelling.  Musculoskeletal:  Positive for back pain.  Neurological:  Negative for dizziness and headaches.  All other ROS negative except what is listed above and in the HPI.      Objective:    There were no vitals taken for this visit.  Wt Readings from Last 3 Encounters:  01/17/23 (!) 370 lb (167.8 kg)  12/06/22 (!) 339 lb (153.8 kg)  11/30/22 (!) 360 lb 3.7 oz (163.4 kg)    Physical Exam Vitals and nursing note reviewed.  Constitutional:      General: She is awake. She  is not in acute distress.     Appearance: She is well-developed. She is obese. She is not ill-appearing.  HENT:     Head: Normocephalic and atraumatic.     Right Ear: Hearing, tympanic membrane, ear canal and external ear normal. No drainage.     Left Ear: Hearing, tympanic membrane, ear canal and external ear normal. No drainage.     Nose: Nose normal.     Right Sinus: No maxillary sinus tenderness or frontal sinus tenderness.     Left Sinus: No maxillary sinus tenderness or frontal sinus tenderness.     Mouth/Throat:     Mouth: Mucous membranes are moist.     Pharynx: Oropharynx is clear. Uvula midline. No pharyngeal swelling, oropharyngeal exudate or posterior oropharyngeal erythema.  Eyes:     General: Lids are normal.        Right eye: No discharge.        Left eye: No discharge.     Extraocular Movements: Extraocular movements intact.     Conjunctiva/sclera: Conjunctivae normal.     Pupils: Pupils are equal, round, and reactive to light.     Visual Fields: Right eye visual fields normal and left eye visual fields normal.  Neck:     Thyroid: No thyromegaly.     Vascular: No carotid bruit.     Trachea: Trachea normal.  Cardiovascular:     Rate and Rhythm: Normal rate and regular rhythm.     Heart sounds: Normal heart sounds. No murmur heard.    No gallop.  Pulmonary:     Effort: Pulmonary effort is normal. No accessory muscle usage or respiratory distress.     Breath sounds: Normal breath sounds.  Chest:  Breasts:    Right: Normal.     Left: Normal.  Abdominal:     General: Bowel sounds are normal. There is no distension.     Palpations: Abdomen is soft. There is no hepatomegaly or splenomegaly.     Tenderness: There is no abdominal tenderness. There is no right CVA tenderness, left CVA tenderness or guarding.  Musculoskeletal:        General: Normal range of motion.     Cervical back: Normal range of motion and neck supple.     Right lower leg: No edema.     Left lower leg: No edema.   Lymphadenopathy:     Head:     Right side of head: No submental, submandibular, tonsillar, preauricular or posterior auricular adenopathy.     Left side of head: No submental, submandibular, tonsillar, preauricular or posterior auricular adenopathy.     Cervical: No cervical adenopathy.     Upper Body:     Right upper body: No supraclavicular, axillary or pectoral adenopathy.     Left upper body: No supraclavicular, axillary or pectoral adenopathy.  Skin:    General: Skin is warm and dry.     Capillary Refill: Capillary refill takes less than 2 seconds.     Findings: No rash.  Neurological:     Mental Status: She is alert and oriented to person, place, and time.     Gait: Gait is intact.     Deep Tendon Reflexes: Reflexes are normal and symmetric.     Reflex Scores:      Brachioradialis reflexes are 2+ on the right side and 2+ on the left side.      Patellar reflexes are 2+ on the right side and 2+ on the left side. Psychiatric:  Attention and Perception: Attention normal.        Mood and Affect: Mood normal.        Speech: Speech normal.        Behavior: Behavior normal. Behavior is cooperative.        Thought Content: Thought content normal.        Judgment: Judgment normal.    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   LABORATORY TESTING:  - Pap smear: not applicable  IMMUNIZATIONS:   - Tdap: Tetanus vaccination status  reviewed: last tetanus booster within 10 years. - Influenza: Refused - Pneumovax: Up to date - Prevnar: Up to date - COVID: Up to date - HPV: Not applicable - Shingrix vaccine: Refused  SCREENING: -Mammogram: Ordered today  - Colonoscopy: Up to date  - Bone Density: Ordered today  -Hearing Test: Not applicable  -Spirometry: Not applicable   PATIENT COUNSELING:   Advised to take 1 mg of folate supplement per day if capable of pregnancy.   Sexuality: Discussed sexually transmitted diseases, partner selection, use of condoms, avoidance of unintended pregnancy  and contraceptive alternatives.   Advised to avoid cigarette smoking.  I discussed with the patient that most people either abstain from alcohol or drink within safe limits (<=14/week and <=4 drinks/occasion for males, <=7/weeks and <= 3 drinks/occasion for females) and that the risk for alcohol disorders and other health effects rises proportionally with the number of drinks per week and how often a drinker exceeds daily limits.  Discussed cessation/primary prevention of drug use and availability of treatment for abuse.   Diet: Encouraged to adjust caloric intake to maintain  or achieve ideal body weight, to reduce intake of dietary saturated fat and total fat, to limit sodium intake by avoiding high sodium foods and not adding table salt, and to maintain adequate dietary potassium and calcium preferably from fresh fruits, vegetables, and low-fat dairy products.    stressed the importance of regular exercise  Injury prevention: Discussed safety belts, safety helmets, smoke detector, smoking near bedding or upholstery.   Dental health: Discussed importance of regular tooth brushing, flossing, and dental visits.    NEXT PREVENTATIVE PHYSICAL DUE IN 1 YEAR. No follow-ups on file.

## 2023-01-24 ENCOUNTER — Encounter: Payer: Self-pay | Admitting: Nurse Practitioner

## 2023-01-24 ENCOUNTER — Ambulatory Visit (INDEPENDENT_AMBULATORY_CARE_PROVIDER_SITE_OTHER): Payer: Medicare Other | Admitting: Nurse Practitioner

## 2023-01-24 VITALS — BP 115/61 | HR 64 | Temp 97.9°F | Ht 62.0 in | Wt 366.7 lb

## 2023-01-24 DIAGNOSIS — I495 Sick sinus syndrome: Secondary | ICD-10-CM

## 2023-01-24 DIAGNOSIS — R7303 Prediabetes: Secondary | ICD-10-CM | POA: Diagnosis not present

## 2023-01-24 DIAGNOSIS — N1832 Chronic kidney disease, stage 3b: Secondary | ICD-10-CM | POA: Diagnosis not present

## 2023-01-24 DIAGNOSIS — Z0000 Encounter for general adult medical examination without abnormal findings: Secondary | ICD-10-CM

## 2023-01-24 DIAGNOSIS — E7800 Pure hypercholesterolemia, unspecified: Secondary | ICD-10-CM | POA: Diagnosis not present

## 2023-01-24 DIAGNOSIS — I129 Hypertensive chronic kidney disease with stage 1 through stage 4 chronic kidney disease, or unspecified chronic kidney disease: Secondary | ICD-10-CM | POA: Diagnosis not present

## 2023-01-24 DIAGNOSIS — I10 Essential (primary) hypertension: Secondary | ICD-10-CM | POA: Diagnosis not present

## 2023-01-24 DIAGNOSIS — M1710 Unilateral primary osteoarthritis, unspecified knee: Secondary | ICD-10-CM | POA: Diagnosis not present

## 2023-01-24 DIAGNOSIS — D352 Benign neoplasm of pituitary gland: Secondary | ICD-10-CM

## 2023-01-24 LAB — URINALYSIS, ROUTINE W REFLEX MICROSCOPIC
Bilirubin, UA: NEGATIVE
Glucose, UA: NEGATIVE
Ketones, UA: NEGATIVE
Leukocytes,UA: NEGATIVE
Nitrite, UA: NEGATIVE
Protein,UA: NEGATIVE
RBC, UA: NEGATIVE
Specific Gravity, UA: 1.02 (ref 1.005–1.030)
Urobilinogen, Ur: 0.2 mg/dL (ref 0.2–1.0)
pH, UA: 5 (ref 5.0–7.5)

## 2023-01-24 NOTE — Assessment & Plan Note (Signed)
Chronic.  Controlled.  Continue with current medication regimen.  Labs ordered today.  Return to clinic in 6 months for reevaluation.  Call sooner if concerns arise.  ? ?

## 2023-01-24 NOTE — Assessment & Plan Note (Signed)
Chronic.  No new neurologic issues. Continue plavix and lifestyle modifications.  Will continue to reassess at future visits.

## 2023-01-24 NOTE — Assessment & Plan Note (Signed)
Chronic.  Controlled.  Continue with current medication regimen on Losartan '100mg'$  daily and Chlorthalidone '25mg'$  daily.  Refills sent today.  Blood pressures are soft in the office but denies hypotension.  Labs ordered today.  Return to clinic in 6 months for reevaluation.  Call sooner if concerns arise.

## 2023-01-24 NOTE — Assessment & Plan Note (Signed)
Recommended eating smaller high protein, low fat meals more frequently and exercising 30 mins a day 5 times a week with a goal of 10-15lb weight loss in the next 3 months.  

## 2023-01-24 NOTE — Assessment & Plan Note (Signed)
Chronic.  Controlled.  Continue with current medication regimen.  Microalbumin ordered.  Patient has not been able to take Jardiance consistently due to cost.  Will refer to CCM to help with PAP.  Labs ordered today.  Return to clinic in 6 months for reevaluation.  Call sooner if concerns arise.

## 2023-01-24 NOTE — Assessment & Plan Note (Signed)
Chronic.  Controlled.  Followed by Cardiology. Continue to follow their recommendations.  Continue with current medication regimen.  Had new Pacemaker placed in March 2024.  Labs ordered today.  Return to clinic in 6 months for reevaluation.  Call sooner if concerns arise.

## 2023-01-24 NOTE — Assessment & Plan Note (Signed)
Labs ordered at visit today.  Will make recommendations based on lab results.   

## 2023-01-24 NOTE — Assessment & Plan Note (Signed)
Referral placed for patient to see Ortho for evaluation and management of knee pain.

## 2023-01-25 ENCOUNTER — Telehealth: Payer: Self-pay

## 2023-01-25 LAB — COMPREHENSIVE METABOLIC PANEL
ALT: 26 IU/L (ref 0–32)
AST: 26 IU/L (ref 0–40)
Albumin/Globulin Ratio: 1.2 (ref 1.2–2.2)
Albumin: 4.1 g/dL (ref 3.9–4.9)
Alkaline Phosphatase: 89 IU/L (ref 44–121)
BUN/Creatinine Ratio: 15 (ref 12–28)
BUN: 15 mg/dL (ref 8–27)
Bilirubin Total: 0.5 mg/dL (ref 0.0–1.2)
CO2: 26 mmol/L (ref 20–29)
Calcium: 9.6 mg/dL (ref 8.7–10.3)
Chloride: 99 mmol/L (ref 96–106)
Creatinine, Ser: 1.03 mg/dL — ABNORMAL HIGH (ref 0.57–1.00)
Globulin, Total: 3.3 g/dL (ref 1.5–4.5)
Glucose: 114 mg/dL — ABNORMAL HIGH (ref 70–99)
Potassium: 3.7 mmol/L (ref 3.5–5.2)
Sodium: 140 mmol/L (ref 134–144)
Total Protein: 7.4 g/dL (ref 6.0–8.5)
eGFR: 60 mL/min/{1.73_m2} (ref >59)

## 2023-01-25 LAB — CBC WITH DIFFERENTIAL/PLATELET
Basophils Absolute: 0.1 10*3/uL (ref 0.0–0.2)
Basos: 1 %
EOS (ABSOLUTE): 0.2 10*3/uL (ref 0.0–0.4)
Eos: 3 %
Hematocrit: 40.5 % (ref 34.0–46.6)
Hemoglobin: 13.2 g/dL (ref 11.1–15.9)
Immature Grans (Abs): 0 10*3/uL (ref 0.0–0.1)
Immature Granulocytes: 0 %
Lymphocytes Absolute: 1.4 10*3/uL (ref 0.7–3.1)
Lymphs: 28 %
MCH: 29.9 pg (ref 26.6–33.0)
MCHC: 32.6 g/dL (ref 31.5–35.7)
MCV: 92 fL (ref 79–97)
Monocytes Absolute: 0.5 10*3/uL (ref 0.1–0.9)
Monocytes: 9 %
Neutrophils Absolute: 2.9 10*3/uL (ref 1.4–7.0)
Neutrophils: 59 %
Platelets: 360 10*3/uL (ref 150–450)
RBC: 4.42 x10E6/uL (ref 3.77–5.28)
RDW: 12.4 % (ref 11.7–15.4)
WBC: 4.9 10*3/uL (ref 3.4–10.8)

## 2023-01-25 LAB — LIPID PANEL
Chol/HDL Ratio: 3.1 ratio (ref 0.0–4.4)
Cholesterol, Total: 171 mg/dL (ref 100–199)
HDL: 56 mg/dL (ref >39)
LDL Chol Calc (NIH): 95 mg/dL (ref 0–99)
Triglycerides: 109 mg/dL (ref 0–149)
VLDL Cholesterol Cal: 20 mg/dL (ref 5–40)

## 2023-01-25 LAB — TSH: TSH: 2.43 u[IU]/mL (ref 0.450–4.500)

## 2023-01-25 LAB — HEMOGLOBIN A1C
Est. average glucose Bld gHb Est-mCnc: 131 mg/dL
Hgb A1c MFr Bld: 6.2 % — ABNORMAL HIGH (ref 4.8–5.6)

## 2023-01-25 NOTE — Progress Notes (Signed)
   Care Guide Note  01/25/2023 Name: Mary Webb MRN: [id number] DOB: 10-02-1956  Referred by: [name], NP Reason for referral : Care Coordination (Outreach to schedule with Pharm d )   Mary Webb is a 67 y.o. year old female who is a primary care patient of [name], NP. Mary Webb was referred to the pharmacist for assistance related to HTN.    An unsuccessful telephone outreach was attempted today to contact the patient who was referred to the pharmacy team for assistance with medication assistance. Additional attempts will be made to contact the patient.   [name], [place], [place] [address] Direct Dial: [phone] [email or web address]

## 2023-01-25 NOTE — Progress Notes (Signed)
Please let patient know that her lab work looks good.  Make sure to drink plenty of water.  A1c remains in the prediabetic range at 6.2%.  Keep up the good work.  Anemia has improved.  No other concerns at this time.  Follow up as discussed.

## 2023-01-27 DIAGNOSIS — I10 Essential (primary) hypertension: Secondary | ICD-10-CM | POA: Diagnosis not present

## 2023-01-27 DIAGNOSIS — Z8673 Personal history of transient ischemic attack (TIA), and cerebral infarction without residual deficits: Secondary | ICD-10-CM | POA: Diagnosis not present

## 2023-01-27 DIAGNOSIS — I5032 Chronic diastolic (congestive) heart failure: Secondary | ICD-10-CM | POA: Diagnosis not present

## 2023-01-27 DIAGNOSIS — I429 Cardiomyopathy, unspecified: Secondary | ICD-10-CM | POA: Diagnosis not present

## 2023-01-27 DIAGNOSIS — R0602 Shortness of breath: Secondary | ICD-10-CM | POA: Diagnosis not present

## 2023-01-27 DIAGNOSIS — I495 Sick sinus syndrome: Secondary | ICD-10-CM | POA: Diagnosis not present

## 2023-01-27 DIAGNOSIS — N1832 Chronic kidney disease, stage 3b: Secondary | ICD-10-CM | POA: Diagnosis not present

## 2023-01-27 DIAGNOSIS — R7303 Prediabetes: Secondary | ICD-10-CM | POA: Diagnosis not present

## 2023-01-27 DIAGNOSIS — Z950 Presence of cardiac pacemaker: Secondary | ICD-10-CM | POA: Diagnosis not present

## 2023-01-29 DIAGNOSIS — I129 Hypertensive chronic kidney disease with stage 1 through stage 4 chronic kidney disease, or unspecified chronic kidney disease: Secondary | ICD-10-CM | POA: Diagnosis not present

## 2023-01-29 DIAGNOSIS — Z8673 Personal history of transient ischemic attack (TIA), and cerebral infarction without residual deficits: Secondary | ICD-10-CM | POA: Diagnosis not present

## 2023-01-29 DIAGNOSIS — N183 Chronic kidney disease, stage 3 unspecified: Secondary | ICD-10-CM | POA: Diagnosis not present

## 2023-01-29 DIAGNOSIS — E785 Hyperlipidemia, unspecified: Secondary | ICD-10-CM | POA: Diagnosis not present

## 2023-01-30 NOTE — Progress Notes (Signed)
   Care Guide Note  01/30/2023 Name: Mary Webb MRN: [id number] DOB: Mar 24, 1956  Referred by: [name], NP Reason for referral : Care Coordination (Outreach to schedule with Pharm d )   Mary Webb is a 67 y.o. year old female who is a primary care patient of [name], NP. Mary Webb was referred to the pharmacist for assistance related to HTN.    A second unsuccessful telephone outreach was attempted today to contact the patient who was referred to the pharmacy team for assistance with medication assistance. Additional attempts will be made to contact the patient.  Mary Webb, [place], [place] [address] Direct Dial: [phone] [email or web address]

## 2023-02-02 ENCOUNTER — Encounter: Payer: Medicare Other | Admitting: Nurse Practitioner

## 2023-02-06 NOTE — Progress Notes (Signed)
   Care Guide Note  02/06/2023 Name: [name] MRN: [id number] DOB: 1956/03/29  Referred by: [name], NP Reason for referral : Care Coordination (Outreach to schedule with Pharm d )   Mary Webb is a 67 y.o. year old female who is a primary care patient of [name], NP. [name] was referred to the pharmacist for assistance related to HTN.    A third unsuccessful telephone outreach was attempted today to contact the patient who was referred to the pharmacy team for assistance with medication management. The Population Health team is pleased to engage with this patient at any time in the future upon receipt of referral and should he/she be interested in assistance from the [facility] team.   [name], [place], [place] [address] Direct Dial: [phone] [email or web address]

## 2023-02-14 ENCOUNTER — Other Ambulatory Visit: Payer: Medicare Other

## 2023-02-14 NOTE — Progress Notes (Signed)
02/14/2023 Name: Mary Webb MRN: [id number] DOB: Nov 20, 1955  Chief Complaint  Patient presents with   Medication Management   Mary Webb is a 67 y.o. year old female who presented for a telephone visit.   They were referred to the pharmacist by their PCP for assistance in managing medication access.   Patient is participating in a Managed Medicaid Plan:  No  Subjective: Telephone visit in regard to referral placed by PCP to assist with affordability of [place] Team: Primary Care Provider: [name], NP  Medication Access/Adherence Current Pharmacy:  [place], [place] - [address] [place] [address] Phone: [phone] Fax: [phone]   Patient reports affordability concerns with their medications: Yes  Patient reports access/transportation concerns to their pharmacy: No  Patient reports adherence concerns with their medications:  Yes  Has been without Jardiance due to cost  Pre-Diabetes with CKD IIIb: Current medications: Jardiance 10mg  daily  -Current glucose readings: FBG 90-120 -Using Accu Chek meter; testing daily -Patient denies hypoglycemic s/sx including dizziness, shakiness, sweating.  -Patient denies hyperglycemic symptoms including polyuria, polydipsia, polyphagia, nocturia, neuropathy, blurred vision. -Patient states Jardiance had been $47/month on insurance but has gone up to >$160/month -Endorses household of 2 with yearly income approximately $32,000 -Inquiring about order for diabetic shoes  Hypertension: Current medications: chlorthalidone 25mg  daily, losartan 100mg  daily -Patient has a validated, automated, upper arm home BP cuff -Current blood pressure readings readings: 110-120/54-60 -Patient denies hypotensive s/sx including dizziness, lightheadedness.  -Patient denies hypertensive symptoms including headache, chest pain, shortness of breath  Objective: Lab Results  Component Value  Date   HGBA1C 6.2 (H) 01/24/2023   Lab Results  Component Value Date   CREATININE 1.03 (H) 01/24/2023   BUN 15 01/24/2023   NA 140 01/24/2023   K 3.7 01/24/2023   CL 99 01/24/2023   CO2 26 01/24/2023   Lab Results  Component Value Date   CHOL 171 01/24/2023   HDL 56 01/24/2023   LDLCALC 95 01/24/2023   TRIG 109 01/24/2023   CHOLHDL 3.1 01/24/2023   Medications Reviewed Today     Reviewed by Darlina Guys, [place] (Pharmacist) on 02/14/23 at 1044  Med List Status: <None>   Medication Order Taking? Sig Documenting Provider Last Dose Status Informant  Accu-Chek Softclix Lancets lancets [id number] Yes Use to check blood sugar 3 times a day and document for visits. Goal is <130 fasting and <180 two hours after meal., [name], NP Taking Active   acetaminophen (TYLENOL) 500 MG tablet [id number]  Take 2 tablets (1,000 mg total) by mouth every 6 (six) hours as needed. Jill Alexanders, PA-C  Active   Ascorbic Acid (VITAMIN C) 1000 MG tablet [id number]  Take 1,000 mg by mouth 2 (two) times a week. [provider]  Active Self  Blood Glucose Monitoring Suppl (ACCU-CHEK AVIVA PLUS) w/Device KIT [id number] Yes Use to check blood sugar 3 times a day and document for visits.  Goal is <130 fasting and <180 two hours after meal. Cannady, Jolene T, NP Taking Active Self  chlorthalidone (HYGROTON) 25 MG tablet [id number] Yes Take 1 tablet (25 mg total) by mouth daily. [name], NP Taking Active Self  Cholecalciferol (VITAMIN D3) 1000 units CAPS [id number]  Take 1,000 Units by mouth 3 (three) times a week. [provider]  Active Self  clopidogrel (PLAVIX) 75 MG tablet [id number] Yes Take 1 tablet (75 mg total) by mouth  daily. [name], NP Taking Active Self  Cyanocobalamin (VITAMIN B 12) 500 MCG TABS [id number]  Take 500 mcg by mouth daily. [provider]  Active Self  fluticasone furoate-vilanterol (BREO ELLIPTA) 100-25 MCG/INH AEPB [id number] Yes  Inhale 1 puff into the lungs daily. [name], NP Taking Active Self           Med Note Colin Rhein, Calandra Madura A   Tue Feb 14, 2023 10:42 AM) Only uses during allergy season  gabapentin (NEURONTIN) 300 MG capsule [id number] Yes Take 300 mg by mouth 3 (three) times daily. [provider] Taking Active            Med Note Colin Rhein, Rainy Rothman A   Tue Feb 14, 2023 10:43 AM) Just when needed  glucose blood (ACCU-CHEK GUIDE) test strip [id number] Yes Use to check blood sugar 3 times a day and document for visits. Goal is <130 fasting and <180 two hours after meal., [name], NP Taking Active   JARDIANCE 10 MG TABS tablet [id number] No Take 1 tablet (10 mg total) by mouth daily.  Patient not taking: Reported on 02/14/2023   [name], NP Not Taking Active Self           Med Note Colin Rhein, Dorian Renfro A   Tue Feb 14, 2023 10:43 AM) Could not afford  losartan (COZAAR) 100 MG tablet [id number] Yes Take 1 tablet (100 mg total) by mouth daily. [name], NP Taking Active   potassium chloride SA (KLOR-CON M) 20 MEQ tablet [id number] Yes Take 1 tablet (20 mEq total) by mouth 2 (two) times daily. [name], NP Taking Active Self  pyridOXINE (VITAMIN B-6) 25 MG tablet [id number]  Take by mouth daily.  [provider]  Active Self           Assessment/Plan:   Pre-Diabetes with CKD IIIb: - Currently controlled - Continue to check FBG daily - Meets financial criteria for Jardiance patient assistance program through [facility] [employer]. Will collaborate with provider, CPhT, and patient to pursue assistance.  - Will follow-up with PCP on diabetic shoe order for patient  Hypertension: - Currently controlled - Continue current regimen and daily BP monitoring - Contact PCP office if SBP<100 and/or DBP<60 consistently or if experiencing s/sx hypotension  Follow Up Plan: Calling next week to check on PAP status and home BP/BG  Darlina Guys, PharmD, DPLA

## 2023-02-15 ENCOUNTER — Telehealth: Payer: Self-pay

## 2023-02-15 NOTE — Telephone Encounter (Signed)
PAP application for JARDIANCE [facility])   has been mailed to pt home. I will fax PCP pages once I receive pt pages.  [facility] Patient Advocate [phone]

## 2023-02-16 ENCOUNTER — Other Ambulatory Visit: Payer: Self-pay | Admitting: Nurse Practitioner

## 2023-02-16 NOTE — Telephone Encounter (Signed)
Requested medication (s) are due for refill today: yes  Requested medication (s) are on the active medication list: yes  Last refill:  01/24/23  Future visit scheduled: yes  Notes to clinic:  Unable to refill per protocol, last refill by another provider.  Historical provider, routing for approval from PCP.     Requested Prescriptions  Pending Prescriptions Disp Refills   gabapentin (NEURONTIN) 300 MG capsule [Pharmacy Med Name: GABAPENTIN 300 MG CAPSULE] 270 capsule 0    Sig: Take 1 capsule (300 mg total) by mouth 3 (three) times daily.     Neurology: Anticonvulsants - gabapentin Failed - 02/16/2023  1:54 PM      Failed - Cr in normal range and within 360 days    Creatinine  Date Value Ref Range Status  07/12/2014 1.37 (H) 0.60 - 1.30 mg/dL Final   Creatinine, Ser  Date Value Ref Range Status  01/24/2023 1.03 (H) 0.57 - 1.00 mg/dL Final         Passed - Completed PHQ-2 or PHQ-9 in the last 360 days      Passed - Valid encounter within last 12 months    Recent Outpatient Visits           3 weeks ago Annual physical exam   [place], Karen, NP   2 months ago Hypertensive kidney disease with stage 3b chronic kidney disease ([place])   [place] Kathrine Haddock, NP   5 months ago History of [place], Nora T, NP   6 months ago Pituitary adenoma [facility])   [place] [name], NP   1 year ago Annual physical exam   [place] [name], NP       Future Appointments             In 5 months Mecum, Dani Gobble, PA-C [place], [place]

## 2023-02-16 NOTE — Telephone Encounter (Signed)
Requested medication (s) are due for refill today: Yes  Requested medication (s) are on the active medication list: Yes  Last refill:    Future visit scheduled: No  Notes to clinic:  Historical provider.    Requested Prescriptions  Pending Prescriptions Disp Refills   gabapentin (NEURONTIN) 300 MG capsule [Pharmacy Med Name: GABAPENTIN 300 MG CAPSULE] 270 capsule 0    Sig: Take 1 capsule (300 mg total) by mouth 3 (three) times daily.     Neurology: Anticonvulsants - gabapentin Failed - 02/16/2023  7:37 AM      Failed - Cr in normal range and within 360 days    Creatinine  Date Value Ref Range Status  07/12/2014 1.37 (H) 0.60 - 1.30 mg/dL Final   Creatinine, Ser  Date Value Ref Range Status  01/24/2023 1.03 (H) 0.57 - 1.00 mg/dL Final         Passed - Completed PHQ-2 or PHQ-9 in the last 360 days      Passed - Valid encounter within last 12 months    Recent Outpatient Visits           3 weeks ago Annual physical exam   [place], Karen, NP   2 months ago Hypertensive kidney disease with stage 3b chronic kidney disease ([place])   [place] Kathrine Haddock, NP   5 months ago History of [place], [place] T, NP   6 months ago Pituitary adenoma [facility])   [place] [name], NP   1 year ago Annual physical exam   [place] [name], NP       Future Appointments             In 5 months Mecum, Dani Gobble, PA-C [place], [place]

## 2023-02-20 ENCOUNTER — Other Ambulatory Visit: Payer: Self-pay | Admitting: Nurse Practitioner

## 2023-02-21 NOTE — Telephone Encounter (Signed)
Requested Prescriptions  Pending Prescriptions Disp Refills   ACCU-CHEK GUIDE test strip [Pharmacy Med Name: ACCU-CHEK GUIDE TEST STRIP 100] 300 strip 0    Sig: Use to check blood sugar 3 times a day and document for visits. Goal is <130 fasting and <180 two hours after meal.,     Endocrinology: Diabetes - Testing Supplies Passed - 02/20/2023  7:15 AM      Passed - Valid encounter within last 12 months    Recent Outpatient Visits           4 weeks ago Annual physical exam   [place] [facility] Larae Grooms, NP   2 months ago Hypertensive kidney disease with stage 3b chronic kidney disease (HCC)   [place] [facility] Gabriel Cirri, NP   5 months ago History of COVID-19   [facility] [place], Corrie Dandy T, NP   7 months ago Pituitary adenoma [facility])   [employer] [facility] Larae Grooms, NP   1 year ago Annual physical exam   [place] [facility] Larae Grooms, NP       Future Appointments             In 5 months Mecum, Oswaldo Conroy, PA-C [place] [facility]

## 2023-02-22 ENCOUNTER — Other Ambulatory Visit: Payer: Medicare Other

## 2023-02-22 ENCOUNTER — Telehealth: Payer: Self-pay

## 2023-02-22 ENCOUNTER — Telehealth: Payer: Self-pay | Admitting: Nurse Practitioner

## 2023-02-22 DIAGNOSIS — I429 Cardiomyopathy, unspecified: Secondary | ICD-10-CM | POA: Diagnosis not present

## 2023-02-22 DIAGNOSIS — R0602 Shortness of breath: Secondary | ICD-10-CM | POA: Diagnosis not present

## 2023-02-22 DIAGNOSIS — I495 Sick sinus syndrome: Secondary | ICD-10-CM | POA: Diagnosis not present

## 2023-02-22 MED ORDER — GABAPENTIN 300 MG PO CAPS: 300.0000 mg | ORAL_CAPSULE | Freq: Three times a day (TID) | ORAL | 1 refills | Status: DC

## 2023-02-22 NOTE — Progress Notes (Signed)
Reached out to patient to get her scheduled, she stated, "My mother is knocking on heaven's door and I have a cardiologist appointment this morning so right now is a very inconvenient time.  Could you call back in a couple of weeks?"  I told her that I would make a note of it and call her regarding her appointment.

## 2023-02-22 NOTE — Telephone Encounter (Signed)
[address] called requesting refill on Gabapentin.  Refill sent today.

## 2023-02-22 NOTE — Progress Notes (Signed)
   02/22/2023  Patient ID: Mary Webb, female   DOB: Oct 01, 1956, 67 y.o.   MRN: [id number]  Patient reaching out in regard to scheduled telephone visit we were supposed to have earlier in the day.  She has had other appointments for herself, and is currently driving back and forth to [place] due to her mother being in critical care.  She states she has not received the patient assistance application for Jardiance but states she knows this was just mailed out yesterday.  Based on communication with medication assistance team, the application was mailed 4/3; so I will follow-up with them.  If mailed 4/3, I would expect that she would have received by now.  Patient has my direct contact number for questions/concerns related to PAP application.  Will hold off for her to contact me for the next little bit with everything she has going on; but I will check in at 4 week mark from today if I have not heard updates on PAP or anything from her.  Lenna Gilford, PharmD, DPLA

## 2023-02-22 NOTE — Progress Notes (Signed)
   02/22/2023  Patient ID: Mary Webb, female   DOB: 1956/06/28, 67 y.o.   MRN: [id number]  Attempted patient outreach for follow-up telephone visit, but was not able to reach the patient.  Called and left voicemail x2.  Will try to reach out to Ms. Kreamer again next week if I have not heard back.  Lenna Gilford, PharmD, DPLA

## 2023-02-28 ENCOUNTER — Ambulatory Visit (INDEPENDENT_AMBULATORY_CARE_PROVIDER_SITE_OTHER): Payer: Medicare Other

## 2023-02-28 VITALS — Ht 62.0 in | Wt 366.0 lb

## 2023-02-28 DIAGNOSIS — Z0000 Encounter for general adult medical examination without abnormal findings: Secondary | ICD-10-CM | POA: Diagnosis not present

## 2023-02-28 NOTE — Progress Notes (Signed)
I connected with  Mary Webb on 02/28/23 by a audio enabled telemedicine application and verified that I am speaking with the correct person using two identifiers.  Patient Location: Home  Provider Location: Office/Clinic  I discussed the limitations of evaluation and management by telemedicine. The patient expressed understanding and agreed to proceed.' Subjective:   Mary Webb is a 67 y.o. female who presents for Medicare Annual (Subsequent) preventive examination.  Review of Systems     Cardiac Risk Factors include: advanced age (>61men, >43 women);diabetes mellitus;hypertension;obesity (BMI >30kg/m2);family history of premature cardiovascular disease     Objective:    Today's Vitals   02/28/23 1030  PainSc: 4    There is no height or weight on file to calculate BMI.     02/28/2023   10:34 AM 01/17/2023    7:07 AM 11/26/2022    7:08 AM 11/23/2022    9:42 AM 07/20/2022   10:32 AM 01/24/2022   11:02 AM 11/16/2020    9:48 AM  Advanced Directives  Does Patient Have a Medical Advance Directive? No No No No No No No  Would patient like information on creating a medical advance directive? No - Patient declined  No - Patient declined  No - Patient declined No - Patient declined     Current Medications (verified) Outpatient Encounter Medications as of 02/28/2023  Medication Sig   Accu-Chek Softclix Lancets lancets Use to check blood sugar 3 times a day and document for visits. Goal is <130 fasting and <180 two hours after meal.,   acetaminophen (TYLENOL) 500 MG tablet Take 2 tablets (1,000 mg total) by mouth every 6 (six) hours as needed.   Ascorbic Acid (VITAMIN C) 1000 MG tablet Take 1,000 mg by mouth 2 (two) times a week.   Blood Glucose Monitoring Suppl (ACCU-CHEK AVIVA PLUS) w/Device KIT Use to check blood sugar 3 times a day and document for visits.  Goal is <130 fasting and <180 two hours after meal.   chlorthalidone (HYGROTON) 25 MG tablet Take 1 tablet (25 mg  total) by mouth daily.   Cholecalciferol (VITAMIN D3) 1000 units CAPS Take 1,000 Units by mouth 3 (three) times a week.   clopidogrel (PLAVIX) 75 MG tablet Take 1 tablet (75 mg total) by mouth daily.   Cyanocobalamin (VITAMIN B 12) 500 MCG TABS Take 500 mcg by mouth daily.   fluticasone furoate-vilanterol (BREO ELLIPTA) 100-25 MCG/INH AEPB Inhale 1 puff into the lungs daily.   gabapentin (NEURONTIN) 300 MG capsule Take 1 capsule (300 mg total) by mouth 3 (three) times daily.   glucose blood (ACCU-CHEK GUIDE) test strip Use to check blood sugar 3 times a day and document for visits. Goal is <130 fasting and <180 two hours after meal.,   JARDIANCE 10 MG TABS tablet Take 1 tablet (10 mg total) by mouth daily.   losartan (COZAAR) 100 MG tablet Take 1 tablet (100 mg total) by mouth daily.   potassium chloride SA (KLOR-CON M) 20 MEQ tablet Take 1 tablet (20 mEq total) by mouth 2 (two) times daily.   pyridOXINE (VITAMIN B-6) 25 MG tablet Take by mouth daily.    No facility-administered encounter medications on file as of 02/28/2023.    Allergies (verified) Elemental sulfur, Levaquin [levofloxacin], Tekturna [aliskiren], Accupril [quinapril hcl], Aspirin, Diovan [valsartan], Erythromycin, Iodine, Latex, Lotensin [benazepril], Norvasc [amlodipine], Shellfish allergy, Sulfa antibiotics, Tetanus toxoids, Tetracycline, Tetracyclines & related, and Penicillins   History: Past Medical History:  Diagnosis Date   Arthritis  CAD (coronary artery disease)    CKD (chronic kidney disease)    CVA (cerebral infarction)    L sided   Dysrhythmia    Hyperlipidemia    Lymphadenopathy    Nausea and vomiting 09/15/2022   Pituitary adenoma    Presence of permanent cardiac pacemaker    Recurrent depression    Shortness of breath dyspnea    Sick sinus syndrome    Stroke    Past Surgical History:  Procedure Laterality Date   ABDOMINAL HYSTERECTOMY     CHOLECYSTECTOMY N/A 11/27/2022   Procedure: LAPAROSCOPIC  CHOLECYSTECTOMY WITH INTRAOPERATIVE CHOLANGIOGRAM;  Surgeon: Axel Filler, MD;  Location: [facility] OR;  Service: General;  Laterality: N/A;   COLONOSCOPY WITH PROPOFOL N/A 01/05/2016   Procedure: COLONOSCOPY WITH PROPOFOL;  Surgeon: Midge Minium, MD;  Location: [facility] ENDOSCOPY;  Service: Endoscopy;  Laterality: N/A;   ESOPHAGOGASTRODUODENOSCOPY (EGD) WITH PROPOFOL N/A 11/26/2022   Procedure: ESOPHAGOGASTRODUODENOSCOPY (EGD) WITH PROPOFOL;  Surgeon: Jeani Hawking, MD;  Location: [facility] ENDOSCOPY;  Service: Gastroenterology;  Laterality: N/A;   EUS N/A 11/26/2022   Procedure: UPPER ENDOSCOPIC ULTRASOUND (EUS) LINEAR;  Surgeon: Jeani Hawking, MD;  Location: [facility] ENDOSCOPY;  Service: Gastroenterology;  Laterality: N/A;   PACEMAKER INSERTION  2015   Dr. Cassie Freer   PPM GENERATOR CHANGEOUT N/A 01/17/2023   Procedure: PPM GENERATOR CHANGEOUT;  Surgeon: Marcina Millard, MD;  Location: [facility] INVASIVE CV LAB;  Service: Cardiovascular;  Laterality: N/A;   Family History  Problem Relation Age of Onset   Hypertension Mother    Hypertension Father    Diabetes Father        under control   Hypertension Maternal Grandmother    Depression Maternal Grandmother    Hypertension Maternal Grandfather    Hypertension Paternal Grandmother    Hypertension Paternal Grandfather    Thyroid disease Maternal Aunt    Social History   Socioeconomic History   Marital status: Married    Spouse name: Not on file   Number of children: Not on file   Years of education: Not on file   Highest education level: Not on file  Occupational History   Not on file  Tobacco Use   Smoking status: Never   Smokeless tobacco: Never  Vaping Use   Vaping Use: Never used  Substance and Sexual Activity   Alcohol use: No   Drug use: No   Sexual activity: Not on file  Other Topics Concern   Not on file  Social History Narrative   Not on file   Social Determinants of Health   Financial Resource Strain: Low Risk  (02/28/2023)   Overall  Financial Resource Strain (CARDIA)    Difficulty of Paying Living Expenses: Not hard at all  Food Insecurity: No Food Insecurity (02/28/2023)   Hunger Vital Sign    Worried About Running Out of Food in the Last Year: Never true    Ran Out of Food in the Last Year: Never true  Transportation Needs: No Transportation Needs (02/28/2023)   PRAPARE - Administrator, Civil Service (Medical): No    Lack of Transportation (Non-Medical): No  Physical Activity: Sufficiently Active (02/28/2023)   Exercise Vital Sign    Days of Exercise per Week: 7 days    Minutes of Exercise per Session: 30 min  Stress: No Stress Concern Present (02/28/2023)   [employer] of Occupational Health - Occupational Stress Questionnaire    Feeling of Stress : Only a little  Social Connections: Moderately Isolated (02/28/2023)  Social Advertising account executive [NHANES]    Frequency of Communication with Friends and Family: More than three times a week    Frequency of Social Gatherings with Friends and Family: More than three times a week    Attends Religious Services: Never    Database administrator or Organizations: No    Attends Engineer, structural: Never    Marital Status: Married    Tobacco Counseling Counseling given: Not Answered   Clinical Intake:  Pre-visit preparation completed: Yes  Pain : 0-10 Pain Score: 4  Pain Type: Chronic pain Pain Location: Knee     Diabetes: Yes CBG done?: No Did pt. bring in CBG monitor from home?: No  How often do you need to have someone help you when you read instructions, pamphlets, or other written materials from your doctor or pharmacy?: 1 - Never  Diabetic?yes Nutrition Risk Assessment:  Has the patient had any N/V/D within the last 2 months?  No  Does the patient have any non-healing wounds?  No  Has the patient had any unintentional weight loss or weight gain?  No   Diabetes:  Is the patient diabetic?  Yes  If diabetic,  was a CBG obtained today?  No  Did the patient bring in their glucometer from home?  No  How often do you monitor your CBG's? Every day.   Financial Strains and Diabetes Management:  Are you having any financial strains with the device, your supplies or your medication? No .  Does the patient want to be seen by Chronic Care Management for management of their diabetes?  No  Would the patient like to be referred to a Nutritionist or for Diabetic Management?  No   Diabetic Exams:  Diabetic Eye Exam: Completed June 2023 per pt. . Pt has been advised about the importance in completing this exam.  Diabetic Foot Exam: Completed no. Pt has been advised about the importance in completing this exam.   Interpreter Needed?: No  Information entered by :: Kennedy Bucker, LPN   Activities of Daily Living    02/28/2023   10:35 AM 01/17/2023    7:05 AM  In your present state of health, do you have any difficulty performing the following activities:  Hearing? 0 0  Vision? 0 0  Difficulty concentrating or making decisions? 0 0  Walking or climbing stairs? 1 1  Dressing or bathing? 0 1  Doing errands, shopping? 0   Preparing Food and eating ? N   Using the Toilet? N   In the past six months, have you accidently leaked urine? N   Do you have problems with loss of bowel control? N   Managing your Medications? N   Managing your Finances? N   Housekeeping or managing your Housekeeping? N     Patient Care Team: Larae Grooms, NP as PCP - General Alwyn Pea, MD as Consulting Physician (Cardiology)  Indicate any recent Medical Services you may have received from other than [facility] providers in the past year (date may be approximate).     Assessment:   This is a routine wellness examination for Niyati.  Hearing/Vision screen Hearing Screening - Comments:: No aids Vision Screening - Comments:: Wears glasses- Dr.Woodard  Dietary issues and exercise activities discussed: Current  Exercise Habits: Home exercise routine, Type of exercise: walking, Time (Minutes): 30, Frequency (Times/Week): 7, Weekly Exercise (Minutes/Week): 210, Intensity: Mild   Goals Addressed  This Visit's Progress    DIET - EAT MORE FRUITS AND VEGETABLES         Depression Screen    02/28/2023   10:33 AM 01/24/2023   11:01 AM 12/06/2022    3:46 PM 07/25/2022    9:45 AM 01/24/2022   10:53 AM 01/21/2022    1:18 PM 09/08/2021   10:12 AM  PHQ 2/9 Scores  PHQ - 2 Score 0 0 0 0 1 0 0  PHQ- 9 Score 0 0 0 0 4 0     Fall Risk    02/28/2023   10:35 AM 01/24/2023   11:00 AM 12/06/2022    3:43 PM 07/25/2022    9:44 AM 01/24/2022   10:50 AM  Fall Risk   Falls in the past year? 0 0 0 0 0  Number falls in past yr: 0 0 0 0 0  Injury with Fall? 0 0 0 0 0  Risk for fall due to : No Fall Risks Impaired balance/gait;Impaired mobility Impaired balance/gait;Impaired mobility No Fall Risks Impaired balance/gait  Follow up Falls prevention discussed;Falls evaluation completed Falls evaluation completed Falls evaluation completed Falls evaluation completed Falls evaluation completed;Education provided;Falls prevention discussed    FALL RISK PREVENTION PERTAINING TO THE HOME:  Any stairs in or around the home? No  If so, are there any without handrails? No  Home free of loose throw rugs in walkways, pet beds, electrical cords, etc? Yes  Adequate lighting in your home to reduce risk of falls? Yes   ASSISTIVE DEVICES UTILIZED TO PREVENT FALLS:  Life alert? No  Use of a cane, walker or w/c? Yes - cane or walker Grab bars in the bathroom? No  Shower chair or bench in shower? No  Elevated toilet seat or a handicapped toilet? No    Cognitive Function:        02/28/2023   10:40 AM 11/16/2020    9:55 AM 11/01/2018    9:39 AM 10/26/2017   10:17 AM  6CIT Screen  What Year? 0 points 0 points 0 points 0 points  What month? 0 points 0 points 0 points 0 points  What time? 0 points 0 points 0  points 0 points  Count back from 20 0 points 0 points 0 points 0 points  Months in reverse 0 points 0 points 0 points 0 points  Repeat phrase 0 points 6 points 0 points 2 points  Total Score 0 points 6 points 0 points 2 points    Immunizations Immunization History  Administered Date(s) Administered   PFIZER(Purple Top)SARS-COV-2 Vaccination 02/01/2020, 02/22/2020, 11/04/2020   Pneumococcal Conjugate-13 06/03/2014   Pneumococcal Polysaccharide-23 08/07/2007, 12/29/2020   Td 07/18/2006    TDAP status: Due, Education has been provided regarding the importance of this vaccine. Advised may receive this vaccine at local pharmacy or Health Dept. Aware to provide a copy of the vaccination record if obtained from local pharmacy or Health Dept. Verbalized acceptance and understanding.  Flu Vaccine status: Declined, Education has been provided regarding the importance of this vaccine but patient still declined. Advised may receive this vaccine at local pharmacy or Health Dept. Aware to provide a copy of the vaccination record if obtained from local pharmacy or Health Dept. Verbalized acceptance and understanding.  Pneumococcal vaccine status: Up to date  Covid-19 vaccine status: Completed vaccines  Qualifies for Shingles Vaccine? Yes   Zostavax completed No   Shingrix Completed?: No.    Education has been provided regarding the importance of this vaccine.  Patient has been advised to call insurance company to determine out of pocket expense if they have not yet received this vaccine. Advised may also receive vaccine at local pharmacy or Health Dept. Verbalized acceptance and understanding.  Screening Tests Health Maintenance  Topic Date Due   FOOT EXAM  Never done   OPHTHALMOLOGY EXAM  Never done   DTaP/Tdap/Td (2 - Tdap) 07/18/2016   COVID-19 Vaccine (4 - 2023-24 season) 07/15/2022   Zoster Vaccines- Shingrix (1 of 2) 04/26/2023 (Originally 05/15/2005)   MAMMOGRAM  05/10/2023   INFLUENZA  VACCINE  06/15/2023   HEMOGLOBIN A1C  07/27/2023   Diabetic kidney evaluation - Urine ACR  12/07/2023   Diabetic kidney evaluation - eGFR measurement  01/24/2024   Medicare Annual Wellness (AWV)  02/28/2024   COLONOSCOPY (Pts 45-22yrs Insurance coverage will need to be confirmed)  01/04/2026   Pneumonia Vaccine 92+ Years old  Completed   DEXA SCAN  Completed   Hepatitis C Screening  Completed   HPV VACCINES  Aged Out    Health Maintenance  Health Maintenance Due  Topic Date Due   FOOT EXAM  Never done   OPHTHALMOLOGY EXAM  Never done   DTaP/Tdap/Td (2 - Tdap) 07/18/2016   COVID-19 Vaccine (4 - 2023-24 season) 07/15/2022    Colorectal cancer screening: Type of screening: Colonoscopy. Completed 01/05/16. Repeat every 10 years  Mammogram status: Completed 05/09/22. Repeat every year- prefers every other year  Bone Density status: Completed 05/09/22. Results reflect: Bone density results: NORMAL. Repeat every 5 years.  Lung Cancer Screening: (Low Dose CT Chest recommended if Age 49-80 years, 30 pack-year currently smoking OR have quit w/in 15years.) does not qualify.   Additional Screening:  Hepatitis C Screening: does qualify; Completed 10/26/17  Vision Screening: Recommended annual ophthalmology exams for early detection of glaucoma and other disorders of the eye. Is the patient up to date with their annual eye exam?  Yes  Who is the provider or what is the name of the office in which the patient attends annual eye exams? Dr.Woodard If pt is not established with a provider, would they like to be referred to a provider to establish care? No .   Dental Screening: Recommended annual dental exams for proper oral hygiene  Community Resource Referral / Chronic Care Management: CRR required this visit?  No   CCM required this visit?  No      Plan:     I have personally reviewed and noted the following in the patient's chart:   Medical and social history Use of alcohol,  tobacco or illicit drugs  Current medications and supplements including opioid prescriptions. Patient is not currently taking opioid prescriptions. Functional ability and status Nutritional status Physical activity Advanced directives List of other physicians Hospitalizations, surgeries, and ER visits in previous 12 months Vitals Screenings to include cognitive, depression, and falls Referrals and appointments  In addition, I have reviewed and discussed with patient certain preventive protocols, quality metrics, and best practice recommendations. A written personalized care plan for preventive services as well as general preventive health recommendations were provided to patient.     Hal Hope, LPN   05/13/5283   Nurse Notes: none

## 2023-02-28 NOTE — Patient Instructions (Signed)
Mary Webb , Thank you for taking time to come for your Medicare Wellness Visit. I appreciate your ongoing commitment to your health goals. Please review the following plan we discussed and let me know if I can assist you in the future.   These are the goals we discussed:  Goals      DIET - EAT MORE FRUITS AND VEGETABLES     DIET - INCREASE WATER INTAKE     Recommend drinking at least 6-7 glasses of water a day      Patient Stated     11/16/2020, wants to get around better     Patient Stated     Get around better        This is a list of the screening recommended for you and due dates:  Health Maintenance  Topic Date Due   Complete foot exam   Never done   Eye exam for diabetics  Never done   DTaP/Tdap/Td vaccine (2 - Tdap) 07/18/2016   COVID-19 Vaccine (4 - 2023-24 season) 07/15/2022   Zoster (Shingles) Vaccine (1 of 2) 04/26/2023*   Mammogram  05/10/2023   Flu Shot  06/15/2023   Hemoglobin A1C  07/27/2023   Yearly kidney health urinalysis for diabetes  12/07/2023   Yearly kidney function blood test for diabetes  01/24/2024   Medicare Annual Wellness Visit  02/28/2024   Colon Cancer Screening  01/04/2026   Pneumonia Vaccine  Completed   DEXA scan (bone density measurement)  Completed   Hepatitis C Screening: USPSTF Recommendation to screen - Ages 56-79 yo.  Completed   HPV Vaccine  Aged Out  *Topic was postponed. The date shown is not the original due date.    Advanced directives: no  Conditions/risks identified: none  Next appointment: Follow up in one year for your annual wellness visit 03/05/24 @ 8:45 am by phone   Preventive Care 65 Years and Older, Female Preventive care refers to lifestyle choices and visits with your health care provider that can promote health and wellness. What does preventive care include? A yearly physical exam. This is also called an annual well check. Dental exams once or twice a year. Routine eye exams. Ask your health care provider  how often you should have your eyes checked. Personal lifestyle choices, including: Daily care of your teeth and gums. Regular physical activity. Eating a healthy diet. Avoiding tobacco and drug use. Limiting alcohol use. Practicing safe sex. Taking low-dose aspirin every day. Taking vitamin and mineral supplements as recommended by your health care provider. What happens during an annual well check? The services and screenings done by your health care provider during your annual well check will depend on your age, overall health, lifestyle risk factors, and family history of disease. Counseling  Your health care provider may ask you questions about your: Alcohol use. Tobacco use. Drug use. Emotional well-being. Home and relationship well-being. Sexual activity. Eating habits. History of falls. Memory and ability to understand (cognition). Work and work Astronomer. Reproductive health. Screening  You may have the following tests or measurements: Height, weight, and BMI. Blood pressure. Lipid and cholesterol levels. These may be checked every 5 years, or more frequently if you are over 54 years old. Skin check. Lung cancer screening. You may have this screening every year starting at age 41 if you have a 30-pack-year history of smoking and currently smoke or have quit within the past 15 years. Fecal occult blood test (FOBT) of the stool. You may have this  test every year starting at age 28. Flexible sigmoidoscopy or colonoscopy. You may have a sigmoidoscopy every 5 years or a colonoscopy every 10 years starting at age 55. Hepatitis C blood test. Hepatitis B blood test. Sexually transmitted disease (STD) testing. Diabetes screening. This is done by checking your blood sugar (glucose) after you have not eaten for a while (fasting). You may have this done every 1-3 years. Bone density scan. This is done to screen for osteoporosis. You may have this done starting at age  53. Mammogram. This may be done every 1-2 years. Talk to your health care provider about how often you should have regular mammograms. Talk with your health care provider about your test results, treatment options, and if necessary, the need for more tests. Vaccines  Your health care provider may recommend certain vaccines, such as: Influenza vaccine. This is recommended every year. Tetanus, diphtheria, and acellular pertussis (Tdap, Td) vaccine. You may need a Td booster every 10 years. Zoster vaccine. You may need this after age 82. Pneumococcal 13-valent conjugate (PCV13) vaccine. One dose is recommended after age 82. Pneumococcal polysaccharide (PPSV23) vaccine. One dose is recommended after age 6. Talk to your health care provider about which screenings and vaccines you need and how often you need them. This information is not intended to replace advice given to you by your health care provider. Make sure you discuss any questions you have with your health care provider. Document Released: 11/27/2015 Document Revised: 07/20/2016 Document Reviewed: 09/01/2015 Elsevier Interactive Patient Education  2017 [employer].  Fall Prevention in the Home Falls can cause injuries. They can happen to people of all ages. There are many things you can do to make your home safe and to help prevent falls. What can I do on the outside of my home? Regularly fix the edges of walkways and driveways and fix any cracks. Remove anything that might make you trip as you walk through a door, such as a raised step or threshold. Trim any bushes or trees on the path to your home. Use bright outdoor lighting. Clear any walking paths of anything that might make someone trip, such as rocks or tools. Regularly check to see if handrails are loose or broken. Make sure that both sides of any steps have handrails. Any raised decks and porches should have guardrails on the edges. Have any leaves, snow, or ice cleared  regularly. Use sand or salt on walking paths during winter. Clean up any spills in your garage right away. This includes oil or grease spills. What can I do in the bathroom? Use night lights. Install grab bars by the toilet and in the tub and shower. Do not use towel bars as grab bars. Use non-skid mats or decals in the tub or shower. If you need to sit down in the shower, use a plastic, non-slip stool. Keep the floor dry. Clean up any water that spills on the floor as soon as it happens. Remove soap buildup in the tub or shower regularly. Attach bath mats securely with double-sided non-slip rug tape. Do not have throw rugs and other things on the floor that can make you trip. What can I do in the bedroom? Use night lights. Make sure that you have a light by your bed that is easy to reach. Do not use any sheets or blankets that are too big for your bed. They should not hang down onto the floor. Have a firm chair that has side arms. You can  use this for support while you get dressed. Do not have throw rugs and other things on the floor that can make you trip. What can I do in the kitchen? Clean up any spills right away. Avoid walking on wet floors. Keep items that you use a lot in easy-to-reach places. If you need to reach something above you, use a strong step stool that has a grab bar. Keep electrical cords out of the way. Do not use floor polish or wax that makes floors slippery. If you must use wax, use non-skid floor wax. Do not have throw rugs and other things on the floor that can make you trip. What can I do with my stairs? Do not leave any items on the stairs. Make sure that there are handrails on both sides of the stairs and use them. Fix handrails that are broken or loose. Make sure that handrails are as long as the stairways. Check any carpeting to make sure that it is firmly attached to the stairs. Fix any carpet that is loose or worn. Avoid having throw rugs at the top or  bottom of the stairs. If you do have throw rugs, attach them to the floor with carpet tape. Make sure that you have a light switch at the top of the stairs and the bottom of the stairs. If you do not have them, ask someone to add them for you. What else can I do to help prevent falls? Wear shoes that: Do not have high heels. Have rubber bottoms. Are comfortable and fit you well. Are closed at the toe. Do not wear sandals. If you use a stepladder: Make sure that it is fully opened. Do not climb a closed stepladder. Make sure that both sides of the stepladder are locked into place. Ask someone to hold it for you, if possible. Clearly mark and make sure that you can see: Any grab bars or handrails. First and last steps. Where the edge of each step is. Use tools that help you move around (mobility aids) if they are needed. These include: Canes. Walkers. Scooters. Crutches. Turn on the lights when you go into a dark area. Replace any light bulbs as soon as they burn out. Set up your furniture so you have a clear path. Avoid moving your furniture around. If any of your floors are uneven, fix them. If there are any pets around you, be aware of where they are. Review your medicines with your doctor. Some medicines can make you feel dizzy. This can increase your chance of falling. Ask your doctor what other things that you can do to help prevent falls. This information is not intended to replace advice given to you by your health care provider. Make sure you discuss any questions you have with your health care provider. Document Released: 08/27/2009 Document Revised: 04/07/2016 Document Reviewed: 12/05/2014 Elsevier Interactive Patient Education  2017 [employer].

## 2023-03-01 DIAGNOSIS — N183 Chronic kidney disease, stage 3 unspecified: Secondary | ICD-10-CM | POA: Diagnosis not present

## 2023-03-01 DIAGNOSIS — E785 Hyperlipidemia, unspecified: Secondary | ICD-10-CM | POA: Diagnosis not present

## 2023-03-01 DIAGNOSIS — I129 Hypertensive chronic kidney disease with stage 1 through stage 4 chronic kidney disease, or unspecified chronic kidney disease: Secondary | ICD-10-CM | POA: Diagnosis not present

## 2023-03-01 DIAGNOSIS — Z8673 Personal history of transient ischemic attack (TIA), and cerebral infarction without residual deficits: Secondary | ICD-10-CM | POA: Diagnosis not present

## 2023-03-08 ENCOUNTER — Other Ambulatory Visit: Payer: Medicare Other

## 2023-03-09 DIAGNOSIS — R0602 Shortness of breath: Secondary | ICD-10-CM | POA: Diagnosis not present

## 2023-03-09 DIAGNOSIS — I429 Cardiomyopathy, unspecified: Secondary | ICD-10-CM | POA: Diagnosis not present

## 2023-03-09 DIAGNOSIS — I10 Essential (primary) hypertension: Secondary | ICD-10-CM | POA: Diagnosis not present

## 2023-03-09 DIAGNOSIS — G4733 Obstructive sleep apnea (adult) (pediatric): Secondary | ICD-10-CM | POA: Diagnosis not present

## 2023-03-09 DIAGNOSIS — N1832 Chronic kidney disease, stage 3b: Secondary | ICD-10-CM | POA: Diagnosis not present

## 2023-03-09 DIAGNOSIS — R7303 Prediabetes: Secondary | ICD-10-CM | POA: Diagnosis not present

## 2023-03-09 DIAGNOSIS — I495 Sick sinus syndrome: Secondary | ICD-10-CM | POA: Diagnosis not present

## 2023-03-09 DIAGNOSIS — Z950 Presence of cardiac pacemaker: Secondary | ICD-10-CM | POA: Diagnosis not present

## 2023-03-09 DIAGNOSIS — Z8673 Personal history of transient ischemic attack (TIA), and cerebral infarction without residual deficits: Secondary | ICD-10-CM | POA: Diagnosis not present

## 2023-03-09 DIAGNOSIS — I5032 Chronic diastolic (congestive) heart failure: Secondary | ICD-10-CM | POA: Diagnosis not present

## 2023-03-16 ENCOUNTER — Encounter: Payer: Self-pay | Admitting: Family Medicine

## 2023-03-16 ENCOUNTER — Ambulatory Visit (INDEPENDENT_AMBULATORY_CARE_PROVIDER_SITE_OTHER): Payer: Medicare Other | Admitting: Family Medicine

## 2023-03-16 VITALS — BP 136/75 | HR 79 | Temp 98.4°F | Ht 62.0 in | Wt 334.2 lb

## 2023-03-16 DIAGNOSIS — G720 Drug-induced myopathy: Secondary | ICD-10-CM | POA: Diagnosis not present

## 2023-03-16 DIAGNOSIS — T466X5A Adverse effect of antihyperlipidemic and antiarteriosclerotic drugs, initial encounter: Secondary | ICD-10-CM

## 2023-03-16 DIAGNOSIS — E119 Type 2 diabetes mellitus without complications: Secondary | ICD-10-CM | POA: Diagnosis not present

## 2023-03-16 NOTE — Progress Notes (Signed)
BP 136/75   Pulse 79   Temp 98.4 F (36.9 C) (Oral)   Ht 5\' 2"  (1.575 m)   Wt (!) 334 lb 3.2 oz (151.6 kg)   SpO2 97%   BMI 61.13 kg/m    Subjective:    Patient ID: Mary Webb, female    DOB: March 05, 1956, 67 y.o.   MRN: [id number]  HPI: Mary Webb is a 67 y.o. female  Chief Complaint  Patient presents with   Diabetes   Mary Webb presents today needing diabetic shoes. She follows with her PCP regularly and sugars have been in good control. She would like to have diabetic shoes. She has diabetic neuropathy and history of callus formation. No hammer toes or history of amputation. No other concerns or complaints at this time.   Relevant past medical, surgical, family and social history reviewed and updated as indicated. Interim medical history since our last visit reviewed. Allergies and medications reviewed and updated.  Review of Systems  Constitutional: Negative.   Respiratory: Negative.    Cardiovascular: Negative.   Gastrointestinal: Negative.   Musculoskeletal: Negative.   Neurological: Negative.   Psychiatric/Behavioral: Negative.      Per HPI unless specifically indicated above     Objective:    BP 136/75   Pulse 79   Temp 98.4 F (36.9 C) (Oral)   Ht 5\' 2"  (1.575 m)   Wt (!) 334 lb 3.2 oz (151.6 kg)   SpO2 97%   BMI 61.13 kg/m   Wt Readings from Last 3 Encounters:  03/16/23 (!) 334 lb 3.2 oz (151.6 kg)  02/28/23 (!) 366 lb (166 kg)  01/24/23 (!) 366 lb 11 oz (166.3 kg)    Physical Exam Vitals and nursing note reviewed.  Constitutional:      General: She is not in acute distress.    Appearance: Normal appearance. She is obese. She is not ill-appearing, toxic-appearing or diaphoretic.  HENT:     Head: Normocephalic and atraumatic.     Right Ear: External ear normal.     Left Ear: External ear normal.     Nose: Nose normal.     Mouth/Throat:     Mouth: Mucous membranes are moist.     Pharynx: Oropharynx is clear.  Eyes:     General:  No scleral icterus.       Right eye: No discharge.        Left eye: No discharge.     Extraocular Movements: Extraocular movements intact.     Conjunctiva/sclera: Conjunctivae normal.     Pupils: Pupils are equal, round, and reactive to light.  Cardiovascular:     Rate and Rhythm: Normal rate and regular rhythm.     Pulses: Normal pulses.     Heart sounds: Normal heart sounds. No murmur heard.    No friction rub. No gallop.  Pulmonary:     Effort: Pulmonary effort is normal. No respiratory distress.     Breath sounds: Normal breath sounds. No stridor. No wheezing, rhonchi or rales.  Chest:     Chest wall: No tenderness.  Musculoskeletal:        General: Normal range of motion.     Cervical back: Normal range of motion and neck supple.  Skin:    General: Skin is warm and dry.     Capillary Refill: Capillary refill takes less than 2 seconds.     Coloration: Skin is not jaundiced or pale.     Findings: No bruising, erythema, lesion  or rash.  Neurological:     General: No focal deficit present.     Mental Status: She is alert and oriented to person, place, and time. Mental status is at baseline.  Psychiatric:        Mood and Affect: Mood normal.        Behavior: Behavior normal.        Thought Content: Thought content normal.        Judgment: Judgment normal.     Results for orders placed or performed in visit on 01/24/23  CBC with Differential/Platelet  Result Value Ref Range   WBC 4.9 3.4 - 10.8 x10E3/uL   RBC 4.42 3.77 - 5.28 x10E6/uL   Hemoglobin 13.2 11.1 - 15.9 g/dL   Hematocrit 16.1 09.6 - 46.6 %   MCV 92 79 - 97 fL   MCH 29.9 26.6 - 33.0 pg   MCHC 32.6 31.5 - 35.7 g/dL   RDW 04.5 40.9 - 81.1 %   Platelets 360 150 - 450 x10E3/uL   Neutrophils 59 Not Estab. %   Lymphs 28 Not Estab. %   Monocytes 9 Not Estab. %   Eos 3 Not Estab. %   Basos 1 Not Estab. %   Neutrophils Absolute 2.9 1.4 - 7.0 x10E3/uL   Lymphocytes Absolute 1.4 0.7 - 3.1 x10E3/uL   Monocytes  Absolute 0.5 0.1 - 0.9 x10E3/uL   EOS (ABSOLUTE) 0.2 0.0 - 0.4 x10E3/uL   Basophils Absolute 0.1 0.0 - 0.2 x10E3/uL   Immature Granulocytes 0 Not Estab. %   Immature Grans (Abs) 0.0 0.0 - 0.1 x10E3/uL  Comprehensive metabolic panel  Result Value Ref Range   Glucose 114 (H) 70 - 99 mg/dL   BUN 15 8 - 27 mg/dL   Creatinine, Ser 9.14 (H) 0.57 - 1.00 mg/dL   eGFR 60 >78 GN/FAO/1.30   BUN/Creatinine Ratio 15 12 - 28   Sodium 140 134 - 144 mmol/L   Potassium 3.7 3.5 - 5.2 mmol/L   Chloride 99 96 - 106 mmol/L   CO2 26 20 - 29 mmol/L   Calcium 9.6 8.7 - 10.3 mg/dL   Total Protein 7.4 6.0 - 8.5 g/dL   Albumin 4.1 3.9 - 4.9 g/dL   Globulin, Total 3.3 1.5 - 4.5 g/dL   Albumin/Globulin Ratio 1.2 1.2 - 2.2   Bilirubin Total 0.5 0.0 - 1.2 mg/dL   Alkaline Phosphatase 89 44 - 121 IU/L   AST 26 0 - 40 IU/L   ALT 26 0 - 32 IU/L  Lipid panel  Result Value Ref Range   Cholesterol, Total 171 100 - 199 mg/dL   Triglycerides 865 0 - 149 mg/dL   HDL 56 >78 mg/dL   VLDL Cholesterol Cal 20 5 - 40 mg/dL   LDL Chol Calc (NIH) 95 0 - 99 mg/dL   Chol/HDL Ratio 3.1 0.0 - 4.4 ratio  TSH  Result Value Ref Range   TSH 2.430 0.450 - 4.500 uIU/mL  Urinalysis, Routine w reflex microscopic  Result Value Ref Range   Specific Gravity, UA 1.020 1.005 - 1.030   pH, UA 5.0 5.0 - 7.5   Color, UA Yellow Yellow   Appearance Ur Clear Clear   Leukocytes,UA Negative Negative   Protein,UA Negative Negative/Trace   Glucose, UA Negative Negative   Ketones, UA Negative Negative   RBC, UA Negative Negative   Bilirubin, UA Negative Negative   Urobilinogen, Ur 0.2 0.2 - 1.0 mg/dL   Nitrite, UA Negative Negative   Microscopic  Examination Comment   HgB A1c  Result Value Ref Range   Hgb A1c MFr Bld 6.2 (H) 4.8 - 5.6 %   Est. average glucose Bld gHb Est-mCnc 131 mg/dL      Assessment & Plan:   Problem List Items Addressed This Visit       Endocrine   Diet-controlled diabetes mellitus (HCC)    Follows regularly  with PCP. Sugars have been under good control. Needs diabetic shoes. Ordered today.       Relevant Medications   losartan (COZAAR) 50 MG tablet     Musculoskeletal and Integument   Statin myopathy - Primary    Unable to tolerate statins. Continue to monitor.         Follow up plan: Return if symptoms worsen or fail to improve.

## 2023-03-16 NOTE — Assessment & Plan Note (Signed)
Unable to tolerate statins. Continue to monitor.  °

## 2023-03-16 NOTE — Assessment & Plan Note (Signed)
Follows regularly with PCP. Sugars have been under good control. Needs diabetic shoes. Ordered today.

## 2023-03-22 ENCOUNTER — Other Ambulatory Visit: Payer: Medicare Other

## 2023-03-22 NOTE — Progress Notes (Signed)
   03/22/2023  Patient ID: Mary Webb, female   DOB: [birth year], 67 y.o.   MRN: [id number]  Subjective/Objective: Telephone visit to check on status of Jardiance PAP application, because as of 4/24, patient portion had not been received  DM and Medication Access -Patient completed her portion of Jardiance PAP application and mailed back in last week -Saw Larae Grooms, NP recently and is working on getting diabetic shoes  HTN -BP 94/60 at [facility] on 4/25, so chlorthalidone dose was decreased to 12.5mg  daily  Assessment/Plan:  DM and Medication Access -Following up with medication assistance team to see if patient portion of application has been received  HTN -Medication listed updated to reflect medication change -Continue regular follow-up with cardiology  Follow-up:  Will follow progress of PAP and inform patient  Lenna Gilford, PharmD, DPLA

## 2023-03-26 ENCOUNTER — Other Ambulatory Visit: Payer: Self-pay | Admitting: Nurse Practitioner

## 2023-03-26 DIAGNOSIS — I6322 Cerebral infarction due to unspecified occlusion or stenosis of basilar arteries: Secondary | ICD-10-CM

## 2023-03-26 DIAGNOSIS — I495 Sick sinus syndrome: Secondary | ICD-10-CM

## 2023-03-27 NOTE — Telephone Encounter (Signed)
Requested Prescriptions  Pending Prescriptions Disp Refills   potassium chloride SA (KLOR-CON M) 20 MEQ tablet [Pharmacy Med Name: POTASSIUM CL ER 20 MEQ TABLET] 180 tablet 0    Sig: Take 1 tablet (20 mEq total) by mouth 2 (two) times daily.     Endocrinology:  Minerals - Potassium Supplementation Failed - 03/26/2023  4:04 PM      Failed - Cr in normal range and within 360 days    Creatinine  Date Value Ref Range Status  07/12/2014 1.37 (H) 0.60 - 1.30 mg/dL Final   Creatinine, Ser  Date Value Ref Range Status  01/24/2023 1.03 (H) 0.57 - 1.00 mg/dL Final         Passed - K in normal range and within 360 days    Potassium  Date Value Ref Range Status  01/24/2023 3.7 3.5 - 5.2 mmol/L Final  07/12/2014 3.4 (L) 3.5 - 5.1 mmol/L Final         Passed - Valid encounter within last 12 months    Recent Outpatient Visits           1 week ago Statin myopathy   [place] [facility] [place], Megan P, DO   2 months ago Annual physical exam   [place] [facility] Larae Grooms, NP   3 months ago Hypertensive kidney disease with stage 3b chronic kidney disease (HCC)   [facility] Gabriel Cirri, NP   6 months ago History of COVID-19   [place] [facility] [place], [place] T, NP   8 months ago Pituitary adenoma [facility])   [place] [facility] Larae Grooms, NP       Future Appointments             In 4 months Mecum, Oswaldo Conroy, PA-C [place] [facility], [facility]             clopidogrel (PLAVIX) 75 MG tablet [Pharmacy Med Name: CLOPIDOGREL 75 MG TABLET] 90 tablet 0    Sig: Take 1 tablet (75 mg total) by mouth daily.     Hematology: Antiplatelets - clopidogrel Failed - 03/26/2023  4:04 PM      Failed - Cr in normal range and within 360 days    Creatinine  Date Value Ref Range Status  07/12/2014 1.37 (H) 0.60 - 1.30 mg/dL Final   Creatinine, Ser  Date Value Ref Range Status   01/24/2023 1.03 (H) 0.57 - 1.00 mg/dL Final         Passed - HCT in normal range and within 180 days    Hematocrit  Date Value Ref Range Status  01/24/2023 40.5 34.0 - 46.6 % Final         Passed - HGB in normal range and within 180 days    Hemoglobin  Date Value Ref Range Status  01/24/2023 13.2 11.1 - 15.9 g/dL Final         Passed - PLT in normal range and within 180 days    Platelets  Date Value Ref Range Status  01/24/2023 360 150 - 450 x10E3/uL Final         Passed - Valid encounter within last 6 months    Recent Outpatient Visits           1 week ago Statin myopathy   [place] [facility] [place], Oralia Rud, DO   2 months ago Annual physical exam    [facility] [place],  Clydie Braun, NP   3 months ago Hypertensive kidney disease with stage 3b chronic kidney disease (HCC)   [facility] Gabriel Cirri, NP   6 months ago History of COVID-19   [place] [facility] [place], [place] T, NP   8 months ago Pituitary adenoma [facility])   [place] [facility] Larae Grooms, NP       Future Appointments             In 4 months Mecum, Oswaldo Conroy, PA-C [place] [facility], [facility]

## 2023-03-28 NOTE — Telephone Encounter (Signed)
HAVE RECEIVED PT PAGES FOR JARDIANCE AND HAVE FAXED TO PROVIDER OFFICE Larae Grooms, NP. [phone].  [facility] Patient Advocate [phone]

## 2023-03-31 DIAGNOSIS — E785 Hyperlipidemia, unspecified: Secondary | ICD-10-CM | POA: Diagnosis not present

## 2023-03-31 DIAGNOSIS — Z8673 Personal history of transient ischemic attack (TIA), and cerebral infarction without residual deficits: Secondary | ICD-10-CM | POA: Diagnosis not present

## 2023-03-31 DIAGNOSIS — I129 Hypertensive chronic kidney disease with stage 1 through stage 4 chronic kidney disease, or unspecified chronic kidney disease: Secondary | ICD-10-CM | POA: Diagnosis not present

## 2023-03-31 DIAGNOSIS — N183 Chronic kidney disease, stage 3 unspecified: Secondary | ICD-10-CM | POA: Diagnosis not present

## 2023-03-31 NOTE — Telephone Encounter (Signed)
RECEIVED both pt and provider pages and has Submitted application for [employer] to [employer]) for patient assistance.   Phone: [phone]  [facility] Patient Advocate [phone]) [phone]

## 2023-04-01 ENCOUNTER — Other Ambulatory Visit: Payer: Self-pay | Admitting: Nurse Practitioner

## 2023-04-01 DIAGNOSIS — I10 Essential (primary) hypertension: Secondary | ICD-10-CM

## 2023-04-01 DIAGNOSIS — I129 Hypertensive chronic kidney disease with stage 1 through stage 4 chronic kidney disease, or unspecified chronic kidney disease: Secondary | ICD-10-CM

## 2023-04-03 ENCOUNTER — Telehealth: Payer: Self-pay

## 2023-04-03 NOTE — Progress Notes (Signed)
   04/03/2023  Patient ID: Mary Webb, female   DOB: Jun 06, 1956, 67 y.o.   MRN: [id number]  Patient outreach to inform Ms. Steinruck PAP for [place] has been approved.  Medication was mailed out to patient today, and she should receive in 7-10 business days.  Follow-up is scheduled with Erin Mecum at [facility] in approximately 3 months, and I recommend follow-up A1c and CMP at that time.  Lenna Gilford, PharmD, DPLA

## 2023-04-03 NOTE — Telephone Encounter (Signed)
No longer current dosing listed Requested Prescriptions  Pending Prescriptions Disp Refills   losartan (COZAAR) 100 MG tablet [Pharmacy Med Name: LOSARTAN POTASSIUM 100 MG TAB] 90 tablet 0    Sig: Take 1 tablet (100 mg total) by mouth daily.     Cardiovascular:  Angiotensin Receptor Blockers Failed - 04/01/2023  1:47 PM      Failed - Cr in normal range and within 180 days    Creatinine  Date Value Ref Range Status  07/12/2014 1.37 (H) 0.60 - 1.30 mg/dL Final   Creatinine, Ser  Date Value Ref Range Status  01/24/2023 1.03 (H) 0.57 - 1.00 mg/dL Final         Passed - K in normal range and within 180 days    Potassium  Date Value Ref Range Status  01/24/2023 3.7 3.5 - 5.2 mmol/L Final  07/12/2014 3.4 (L) 3.5 - 5.1 mmol/L Final         Passed - Patient is not pregnant      Passed - Last BP in normal range    BP Readings from Last 1 Encounters:  03/16/23 136/75         Passed - Valid encounter within last 6 months    Recent Outpatient Visits           2 weeks ago Statin myopathy   [place] [facility] [place], Megan P, DO   2 months ago Annual physical exam   [place] [facility] Larae Grooms, NP   3 months ago Hypertensive kidney disease with stage 3b chronic kidney disease (HCC)   [place] [facility] [place] Gabriel Cirri, NP   6 months ago History of COVID-19   [facility] [place], [place] T, NP   8 months ago Pituitary adenoma [facility])   [facility] Larae Grooms, NP       Future Appointments             In 3 months Mecum, Oswaldo Conroy, PA-C [place] [facility], [facility]

## 2023-04-17 DIAGNOSIS — I495 Sick sinus syndrome: Secondary | ICD-10-CM | POA: Diagnosis not present

## 2023-04-25 ENCOUNTER — Telehealth: Payer: Self-pay | Admitting: Nurse Practitioner

## 2023-04-25 NOTE — Telephone Encounter (Signed)
Requested paperwork placed in providers folder for signature

## 2023-04-25 NOTE — Telephone Encounter (Signed)
Copied from CRM [id number]. Topic: General - Other >> Apr 25, 2023  9:46 AM Franchot Heidelberg wrote: Reason for CRM: Pt called reporting that she came to the office and had paperwork completed to receive diabetic shoes.   Best contact: [phone]  Pt is upset because she says that she has been waiting 6 weeks. She also says her husband completed his paperwork in the office at the same time she did and he has received his diabetic shoes while she has not.

## 2023-04-25 NOTE — Telephone Encounter (Signed)
Paperwork was filled out at the same time as her husbands. Will refill out paperwork.

## 2023-05-01 DIAGNOSIS — I129 Hypertensive chronic kidney disease with stage 1 through stage 4 chronic kidney disease, or unspecified chronic kidney disease: Secondary | ICD-10-CM | POA: Diagnosis not present

## 2023-05-01 DIAGNOSIS — Z8673 Personal history of transient ischemic attack (TIA), and cerebral infarction without residual deficits: Secondary | ICD-10-CM | POA: Diagnosis not present

## 2023-05-01 DIAGNOSIS — N183 Chronic kidney disease, stage 3 unspecified: Secondary | ICD-10-CM | POA: Diagnosis not present

## 2023-05-01 DIAGNOSIS — E785 Hyperlipidemia, unspecified: Secondary | ICD-10-CM | POA: Diagnosis not present

## 2023-05-03 ENCOUNTER — Other Ambulatory Visit: Payer: Self-pay | Admitting: Nurse Practitioner

## 2023-05-03 NOTE — Telephone Encounter (Signed)
Requested Prescriptions  Pending Prescriptions Disp Refills   ACCU-CHEK GUIDE test strip [Pharmacy Med Name: ACCU-CHEK GUIDE TEST STRIP 100] 100 strip 3    Sig: Use to check blood sugar 3 times a day and document for visits. Goal is <130 fasting and <180 two hours after meal.,     Endocrinology: Diabetes - Testing Supplies Passed - 05/03/2023  7:53 AM      Passed - Valid encounter within last 12 months    Recent Outpatient Visits           1 month ago Statin myopathy   [place] [facility] [place], Megan P, DO   3 months ago Annual physical exam   [place] [facility] Larae Grooms, NP   4 months ago Hypertensive kidney disease with stage 3b chronic kidney disease (HCC)   [place] [facility] Gabriel Cirri, NP   7 months ago History of COVID-19   [place] [facility] [place], Corrie Dandy T, NP   9 months ago Pituitary adenoma [facility])   [place] [facility] Larae Grooms, NP       Future Appointments             In 2 months Mecum, Oswaldo Conroy, PA-C  [facility], [facility]

## 2023-05-09 DIAGNOSIS — E119 Type 2 diabetes mellitus without complications: Secondary | ICD-10-CM | POA: Diagnosis not present

## 2023-05-31 DIAGNOSIS — Z8673 Personal history of transient ischemic attack (TIA), and cerebral infarction without residual deficits: Secondary | ICD-10-CM | POA: Diagnosis not present

## 2023-05-31 DIAGNOSIS — E785 Hyperlipidemia, unspecified: Secondary | ICD-10-CM | POA: Diagnosis not present

## 2023-05-31 DIAGNOSIS — N183 Chronic kidney disease, stage 3 unspecified: Secondary | ICD-10-CM | POA: Diagnosis not present

## 2023-05-31 DIAGNOSIS — I129 Hypertensive chronic kidney disease with stage 1 through stage 4 chronic kidney disease, or unspecified chronic kidney disease: Secondary | ICD-10-CM | POA: Diagnosis not present

## 2023-06-14 DIAGNOSIS — H1045 Other chronic allergic conjunctivitis: Secondary | ICD-10-CM | POA: Diagnosis not present

## 2023-06-14 DIAGNOSIS — H35033 Hypertensive retinopathy, bilateral: Secondary | ICD-10-CM | POA: Diagnosis not present

## 2023-06-14 DIAGNOSIS — H40013 Open angle with borderline findings, low risk, bilateral: Secondary | ICD-10-CM | POA: Diagnosis not present

## 2023-06-14 DIAGNOSIS — D352 Benign neoplasm of pituitary gland: Secondary | ICD-10-CM | POA: Diagnosis not present

## 2023-06-14 LAB — HM DIABETES EYE EXAM

## 2023-06-22 ENCOUNTER — Other Ambulatory Visit: Payer: Self-pay | Admitting: Nurse Practitioner

## 2023-06-22 DIAGNOSIS — I6322 Cerebral infarction due to unspecified occlusion or stenosis of basilar arteries: Secondary | ICD-10-CM

## 2023-06-22 DIAGNOSIS — I495 Sick sinus syndrome: Secondary | ICD-10-CM

## 2023-06-23 ENCOUNTER — Other Ambulatory Visit: Payer: Self-pay | Admitting: Nurse Practitioner

## 2023-06-23 NOTE — Telephone Encounter (Signed)
Requested Prescriptions  Pending Prescriptions Disp Refills   potassium chloride SA (KLOR-CON M) 20 MEQ tablet [Pharmacy Med Name: POTASSIUM CL ER 20 MEQ TABLET] 180 tablet 0    Sig: Take 1 tablet (20 mEq total) by mouth 2 (two) times daily.     Endocrinology:  Minerals - Potassium Supplementation Failed - 06/22/2023  7:23 AM      Failed - Cr in normal range and within 360 days    Creatinine  Date Value Ref Range Status  07/12/2014 1.37 (H) 0.60 - 1.30 mg/dL Final   Creatinine, Ser  Date Value Ref Range Status  01/24/2023 1.03 (H) 0.57 - 1.00 mg/dL Final         Passed - K in normal range and within 360 days    Potassium  Date Value Ref Range Status  01/24/2023 3.7 3.5 - 5.2 mmol/L Final  07/12/2014 3.4 (L) 3.5 - 5.1 mmol/L Final         Passed - Valid encounter within last 12 months    Recent Outpatient Visits           3 months ago Statin myopathy   [place] [facility] [place], Megan P, DO   5 months ago Annual physical exam   [place] [facility] Larae Grooms, NP   6 months ago Hypertensive kidney disease with stage 3b chronic kidney disease (HCC)   [place] [facility] Gabriel Cirri, NP   9 months ago History of COVID-19   [place] [facility] [place], [place] T, NP   11 months ago Pituitary adenoma [facility])   [place] [facility] Larae Grooms, NP       Future Appointments             In 1 month Mecum, Oswaldo Conroy, PA-C [facility], [facility]             clopidogrel (PLAVIX) 75 MG tablet [Pharmacy Med Name: CLOPIDOGREL 75 MG TABLET] 90 tablet 0    Sig: Take 1 tablet (75 mg total) by mouth daily.     Hematology: Antiplatelets - clopidogrel Failed - 06/22/2023  7:23 AM      Failed - Cr in normal range and within 360 days    Creatinine  Date Value Ref Range Status  07/12/2014 1.37 (H) 0.60 - 1.30 mg/dL Final   Creatinine, Ser  Date Value Ref Range Status   01/24/2023 1.03 (H) 0.57 - 1.00 mg/dL Final         Passed - HCT in normal range and within 180 days    Hematocrit  Date Value Ref Range Status  01/24/2023 40.5 34.0 - 46.6 % Final         Passed - HGB in normal range and within 180 days    Hemoglobin  Date Value Ref Range Status  01/24/2023 13.2 11.1 - 15.9 g/dL Final         Passed - PLT in normal range and within 180 days    Platelets  Date Value Ref Range Status  01/24/2023 360 150 - 450 x10E3/uL Final         Passed - Valid encounter within last 6 months    Recent Outpatient Visits           3 months ago Statin myopathy    [facility] Megan P, DO   5 months ago Annual physical exam    [facility] [place],  Clydie Braun, NP   6 months ago Hypertensive kidney disease with stage 3b chronic kidney disease (HCC)   [place] [facility] Gabriel Cirri, NP   9 months ago History of COVID-19   [place] [facility] [place], Keene T, NP   11 months ago Pituitary adenoma [facility])   [place] [facility] Larae Grooms, NP       Future Appointments             In 1 month Mecum, Oswaldo Conroy, PA-C [place] [facility], [facility]

## 2023-06-26 NOTE — Telephone Encounter (Signed)
Unable to refill per protocol, Rx request is too soon for refill, last refill 06/23/23 for 90 days.  Requested Prescriptions  Pending Prescriptions Disp Refills   potassium chloride SA (KLOR-CON M) 20 MEQ tablet [Pharmacy Med Name: POTASSIUM CL ER 20 MEQ TABLET] 180 tablet 0    Sig: Take 1 tablet (20 mEq total) by mouth 2 (two) times daily.     Endocrinology:  Minerals - Potassium Supplementation Failed - 06/23/2023 12:24 PM      Failed - Cr in normal range and within 360 days    Creatinine  Date Value Ref Range Status  07/12/2014 1.37 (H) 0.60 - 1.30 mg/dL Final   Creatinine, Ser  Date Value Ref Range Status  01/24/2023 1.03 (H) 0.57 - 1.00 mg/dL Final         Passed - K in normal range and within 360 days    Potassium  Date Value Ref Range Status  01/24/2023 3.7 3.5 - 5.2 mmol/L Final  07/12/2014 3.4 (L) 3.5 - 5.1 mmol/L Final         Passed - Valid encounter within last 12 months    Recent Outpatient Visits           3 months ago Statin myopathy   [place] [facility] [place], Megan P, DO   5 months ago Annual physical exam   [place] [facility] Larae Grooms, NP   6 months ago Hypertensive kidney disease with stage 3b chronic kidney disease (HCC)   [place] [facility] Gabriel Cirri, NP   9 months ago History of COVID-19   [facility] [place], [place] T, NP   11 months ago Pituitary adenoma [place])   [place] [facility] Larae Grooms, NP       Future Appointments             In 1 month Mecum, Oswaldo Conroy, PA-C [place], [facility]

## 2023-07-01 DIAGNOSIS — Z8673 Personal history of transient ischemic attack (TIA), and cerebral infarction without residual deficits: Secondary | ICD-10-CM | POA: Diagnosis not present

## 2023-07-01 DIAGNOSIS — E785 Hyperlipidemia, unspecified: Secondary | ICD-10-CM | POA: Diagnosis not present

## 2023-07-01 DIAGNOSIS — N183 Chronic kidney disease, stage 3 unspecified: Secondary | ICD-10-CM | POA: Diagnosis not present

## 2023-07-01 DIAGNOSIS — I129 Hypertensive chronic kidney disease with stage 1 through stage 4 chronic kidney disease, or unspecified chronic kidney disease: Secondary | ICD-10-CM | POA: Diagnosis not present

## 2023-07-27 ENCOUNTER — Ambulatory Visit (INDEPENDENT_AMBULATORY_CARE_PROVIDER_SITE_OTHER): Payer: Medicare Other | Admitting: Physician Assistant

## 2023-07-27 ENCOUNTER — Encounter: Payer: Self-pay | Admitting: Physician Assistant

## 2023-07-27 VITALS — BP 143/73 | HR 86 | Ht 62.0 in | Wt 372.6 lb

## 2023-07-27 DIAGNOSIS — I10 Essential (primary) hypertension: Secondary | ICD-10-CM | POA: Diagnosis not present

## 2023-07-27 DIAGNOSIS — T466X5A Adverse effect of antihyperlipidemic and antiarteriosclerotic drugs, initial encounter: Secondary | ICD-10-CM | POA: Diagnosis not present

## 2023-07-27 DIAGNOSIS — E785 Hyperlipidemia, unspecified: Secondary | ICD-10-CM

## 2023-07-27 DIAGNOSIS — N183 Chronic kidney disease, stage 3 unspecified: Secondary | ICD-10-CM | POA: Diagnosis not present

## 2023-07-27 DIAGNOSIS — R8281 Pyuria: Secondary | ICD-10-CM

## 2023-07-27 DIAGNOSIS — G720 Drug-induced myopathy: Secondary | ICD-10-CM | POA: Diagnosis not present

## 2023-07-27 DIAGNOSIS — E119 Type 2 diabetes mellitus without complications: Secondary | ICD-10-CM

## 2023-07-27 DIAGNOSIS — I129 Hypertensive chronic kidney disease with stage 1 through stage 4 chronic kidney disease, or unspecified chronic kidney disease: Secondary | ICD-10-CM | POA: Diagnosis not present

## 2023-07-27 DIAGNOSIS — N390 Urinary tract infection, site not specified: Secondary | ICD-10-CM | POA: Diagnosis not present

## 2023-07-27 DIAGNOSIS — Z7984 Long term (current) use of oral hypoglycemic drugs: Secondary | ICD-10-CM

## 2023-07-27 LAB — MICROSCOPIC EXAMINATION

## 2023-07-27 LAB — URINALYSIS, ROUTINE W REFLEX MICROSCOPIC
Bilirubin, UA: NEGATIVE
Ketones, UA: NEGATIVE
Nitrite, UA: NEGATIVE
Protein,UA: NEGATIVE
RBC, UA: NEGATIVE
Specific Gravity, UA: 1.02 (ref 1.005–1.030)
Urobilinogen, Ur: 1 mg/dL (ref 0.2–1.0)
pH, UA: 6 (ref 5.0–7.5)

## 2023-07-27 MED ORDER — GABAPENTIN 300 MG PO CAPS: 300.0000 mg | ORAL_CAPSULE | Freq: Two times a day (BID) | ORAL | 1 refills | Status: DC

## 2023-07-27 NOTE — Assessment & Plan Note (Signed)
Chronic, historic condition Recheck lipid panel today Recommend she reduce saturated fats in diet and exercise as tolerated  She is not able to tolerate statins so will need to manage with lifestyle for now  Follow up in 6 months or sooner if concerns arise

## 2023-07-27 NOTE — Assessment & Plan Note (Signed)
Chronic, historic condition Currently taking Jardiance 10 mg PO daily, she reports she is not taking every day due to intermittent concerns for UTI/ yeast infections but does not seek evaluation for this due to abx reactions  Most recent A1c was 6.2- recheck today, Results to dictate further management   Will check urine and send for culture today - patient denies dysuria today (may have chronic colonization?) Reviewed importance of annual eye exams  Continue current regimen  Follow up in 3 months or sooner if concerns arise

## 2023-07-27 NOTE — Progress Notes (Signed)
Established Patient Office Visit  Name: Mary Webb   MRN: [id number]    DOB: 04-25-1956   Date:07/27/2023  Today's Provider: Jacquelin Hawking, MHS, PA-C Introduced myself to the patient as a PA-C and provided education on APPs in clinical practice.         Subjective  Chief Complaint  Chief Complaint  Patient presents with   Hyperlipidemia   Hypertension   Diabetes    HPI  HYPERTENSION / HYPERLIPIDEMIA Satisfied with current treatment? yes Duration of hypertension: years BP monitoring frequency: a few times a week BP range: 120s/70s  BP medication side effects: no Past BP meds: Losartan 10 mg PO every day, Chlorthalidone 25 mg PO every day,  BP meds are managed by Dr. Juliann Pares  Duration of hyperlipidemia: years Cholesterol medication side effects: yes- statin myopathy  Cholesterol supplements: none Past cholesterol medications: none at this time  Medication compliance: good compliance Aspirin: no Recent stressors: yes- she has had 3 surgeries and her mother passed this year  Recurrent headaches: no Visual changes: no Palpitations: no Dyspnea: yes- due to allergies  Chest pain: no Lower extremity edema: yes- sometimes  Dizzy/lightheaded: no  Diabetes, Type 2 - Last A1c : 6.2 - Medications: Jardiance 10 mg PO every day- she states she does not refills today as she is on PAP for this.she is not taking daily due to concerns for UTIs. She states she stops taking it and drinks plenty of water, ginger and honey to help clear it?  - Compliance:  - Checking BG at home: Checks at home 2-3 times per week. 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    Arthritis of knee 01/24/2023   S/P cholecystectomy 12/04/2022   Elevated LFTs 11/24/2022   Hypertension 12/29/2020   Pacemaker 12/29/2020   Diet-controlled diabetes mellitus (HCC) 12/28/2020   Advanced care planning/counseling discussion 11/01/2017   Sick sinus syndrome (HCC) 04/28/2015   Hypertensive kidney disease with CKD stage III (HCC) 04/28/2015   Pituitary adenoma (HCC) 04/28/2015   Morbid obesity (HCC) 04/28/2015   Hyperlipidemia 04/28/2015   History of stroke 04/28/2015    Past Surgical History:  Procedure Laterality Date   ABDOMINAL HYSTERECTOMY     CHOLECYSTECTOMY N/A 11/27/2022   Procedure: LAPAROSCOPIC CHOLECYSTECTOMY WITH INTRAOPERATIVE CHOLANGIOGRAM;  Surgeon: Axel Filler, MD;  Location: [facility];  Service: General;  Laterality: N/A;   COLONOSCOPY WITH PROPOFOL N/A 01/05/2016   Procedure: COLONOSCOPY WITH PROPOFOL;  Surgeon: Midge Minium, MD;  Location: [facility] ENDOSCOPY;  Service: Endoscopy;  Laterality: N/A;   ESOPHAGOGASTRODUODENOSCOPY (EGD) WITH PROPOFOL N/A 11/26/2022   Procedure: ESOPHAGOGASTRODUODENOSCOPY (EGD) WITH PROPOFOL;  Surgeon: Jeani Hawking, MD;  Location[facility] ENDOSCOPY;  Service: Gastroenterology;  Laterality: N/A;   EUS N/A 11/26/2022   Procedure: UPPER ENDOSCOPIC ULTRASOUND (EUS) LINEAR;  Surgeon: Jeani Hawking, MD;  Location[facility] ENDOSCOPY;  Service: Gastroenterology;  Laterality: N/A;   PACEMAKER INSERTION  2015   Dr. Cassie Freer   PPM GENERATOR CHANGEOUT N/A 01/17/2023   Procedure: PPM GENERATOR CHANGEOUT;  Surgeon: Marcina Millard, MD;  Location: [facility] INVASIVE CV LAB;  Service: Cardiovascular;  Laterality: N/A;    Family History  Problem  Relation Age of Onset   Hypertension Mother    Hypertension Father    Diabetes Father        under control   Hypertension Maternal Grandmother    Depression Maternal Grandmother    Hypertension Maternal Grandfather    Hypertension Paternal Grandmother    Hypertension Paternal Grandfather    Thyroid disease  Maternal Aunt     Social History   Tobacco Use   Smoking status: Never   Smokeless tobacco: Never  Substance Use Topics   Alcohol use: No     Current Outpatient Medications:    Accu-Chek Softclix Lancets lancets, Use to check blood sugar 3 times a day and document for visits. Goal is <130 fasting and <180 two hours after meal.,, Disp: 200 each, Rfl: 2   acetaminophen (TYLENOL) 500 MG tablet, Take 2 tablets (1,000 mg total) by mouth every 6 (six) hours as needed., Disp: 30 tablet, Rfl: 0   Ascorbic Acid (VITAMIN C) 1000 MG tablet, Take 1,000 mg by mouth 2 (two) times a week., Disp: , Rfl:    azelastine (OPTIVAR) 0.05 % ophthalmic solution, Apply to eye., Disp: , Rfl:    Blood Glucose Monitoring Suppl (ACCU-CHEK AVIVA PLUS) w/Device KIT, Use to check blood sugar 3 times a day and document for visits.  Goal is <130 fasting and <180 two hours after meal., Disp: 1 kit, Rfl: 1   chlorthalidone (HYGROTON) 25 MG tablet, Take by mouth., Disp: , Rfl:    Cholecalciferol (VITAMIN D3) 1000 units CAPS, Take 1,000 Units by mouth 3 (three) times a week., Disp: , Rfl:    clopidogrel (PLAVIX) 75 MG tablet, Take 1 tablet (75 mg total) by mouth daily., Disp: 90 tablet, Rfl: 0   Cyanocobalamin (VITAMIN B 12) 500 MCG TABS, Take 500 mcg by mouth daily., Disp: , Rfl:    fluticasone furoate-vilanterol (BREO ELLIPTA) 100-25 MCG/INH AEPB, Inhale 1 puff into the lungs daily., Disp: 60 each, Rfl: 1   glucose blood (ACCU-CHEK GUIDE) test strip, Use to check blood sugar 3 times a day and document for visits. Goal is <130 fasting and <180 two hours after meal.,, Disp: 100 strip, Rfl: 3   JARDIANCE 10 MG TABS tablet, Take 1 tablet (10 mg total) by mouth daily., Disp: 30 tablet, Rfl: 0   losartan (COZAAR) 50 MG tablet, Take 1 tablet by mouth daily. Patient is taking 25 MG tablet., Disp: , Rfl:    potassium chloride SA (KLOR-CON M) 20 MEQ tablet, Take 1 tablet (20 mEq total) by mouth 2 (two) times daily., Disp: 180 tablet,  Rfl: 0   pyridOXINE (VITAMIN B-6) 25 MG tablet, Take by mouth daily. , Disp: , Rfl:    gabapentin (NEURONTIN) 300 MG capsule, Take 1 capsule (300 mg total) by mouth 2 (two) times daily., Disp: 180 capsule, Rfl: 1  Allergies  Allergen Reactions   Elemental Sulfur Anaphylaxis   Levaquin [Levofloxacin] Other (See Comments)    Stroke   Tekturna [Aliskiren] Shortness Of Breath   Accupril [Quinapril Hcl]    Aspirin    Diovan [Valsartan]    Erythromycin    Iodine Hives   Latex     Can't remember reaction    Lotensin [Benazepril]    Norvasc [Amlodipine]    Shellfish Allergy Swelling   Sulfa Antibiotics    Tetanus Toxoids    Tetracycline     Other reaction(s): Unknown   Tetracyclines & Related    Penicillins Rash    I personally reviewed active problem  list, medication list, allergies, health maintenance, notes from last encounter, lab results with the patient/caregiver today.   Review of Systems  Constitutional:  Negative for malaise/fatigue.  Eyes:  Negative for blurred vision, double vision and photophobia.  Respiratory:  Positive for shortness of breath. Negative for wheezing.   Cardiovascular:  Positive for leg swelling. Negative for chest pain and palpitations.  Genitourinary:  Negative for dysuria.  Neurological:  Negative for dizziness, tingling and headaches.      Objective  Vitals:   07/27/23 1022  BP: (!) 143/73  Pulse: 86  SpO2: 97%  Weight: (!) 372 lb 9.6 oz (169 kg)  Height: 5\' 2"  (1.575 m)    Body mass index is 68.15 kg/m.  Physical Exam Vitals reviewed.  Constitutional:      General: She is awake.     Appearance: Normal appearance. She is well-developed and well-groomed.  HENT:     Head: Normocephalic and atraumatic.  Cardiovascular:     Rate and Rhythm: Normal rate and regular rhythm.     Pulses:          Radial pulses are 2+ on the right side and 2+ on the left side.     Heart sounds: Heart sounds are distant.  Pulmonary:     Effort:  Pulmonary effort is normal.     Breath sounds: Normal breath sounds. No decreased air movement. No decreased breath sounds, wheezing, rhonchi or rales.  Musculoskeletal:     Right lower leg: 2+ Edema present.     Left lower leg: 2+ Edema present.  Neurological:     Mental Status: She is alert.  Psychiatric:        Behavior: Behavior is cooperative.      Recent Results (from the past 2160 hour(s))  Urinalysis, Routine w reflex microscopic     Status: Abnormal   Collection Time: 07/27/23 11:08 AM  Result Value Ref Range   Specific Gravity, UA 1.020 1.005 - 1.030   pH, UA 6.0 5.0 - 7.5   Color, UA Yellow Yellow   Appearance Ur Cloudy (A) Clear   Leukocytes,UA Trace (A) Negative   Protein,UA Negative Negative/Trace   Glucose, UA 3+ (A) Negative   Ketones, UA Negative Negative   RBC, UA Negative Negative   Bilirubin, UA Negative Negative   Urobilinogen, Ur 1.0 0.2 - 1.0 mg/dL   Nitrite, UA Negative Negative   Microscopic Examination See below:   Microscopic Examination     Status: Abnormal   Collection Time: 07/27/23 11:08 AM   Urine  Result Value Ref Range   WBC, UA 11-30 (A) 0 - 5 /hpf   RBC, Urine 0-2 0 - 2 /hpf   Epithelial Cells (non renal) 0-10 0 - 10 /hpf   Bacteria, UA Many (A) None seen/Few     PHQ2/9:    07/27/2023   10:29 AM 02/28/2023   10:33 AM 01/24/2023   11:01 AM 12/06/2022    3:46 PM 07/25/2022    9:45 AM  Depression screen PHQ 2/9  Decreased Interest 0 0 0 0 0  Down, Depressed, Hopeless 0 0 0 0 0  PHQ - 2 Score 0 0 0 0 0  Altered sleeping 0 0 0 0 0  Tired, decreased energy 0 0 0 0 0  Change in appetite 0 0 0 0 0  Feeling bad or failure about yourself  0 0 0 0 0  Trouble concentrating 0 0 0 0 0  Moving slowly or fidgety/restless 0 0  0 0 0  Suicidal thoughts 0 0 0 0 0  PHQ-9 Score 0 0 0 0 0  Difficult doing work/chores Not difficult at all Not difficult at all Not difficult at all Not difficult at all Not difficult at all      Fall Risk:     02/28/2023   10:35 AM 01/24/2023   11:00 AM 12/06/2022    3:43 PM 07/25/2022    9:44 AM 01/24/2022   10:50 AM  Fall Risk   Falls in the past year? 0 0 0 0 0  Number falls in past yr: 0 0 0 0 0  Injury with Fall? 0 0 0 0 0  Risk for fall due to : No Fall Risks Impaired balance/gait;Impaired mobility Impaired balance/gait;Impaired mobility No Fall Risks Impaired balance/gait  Follow up Falls prevention discussed;Falls evaluation completed Falls evaluation completed Falls evaluation completed Falls evaluation completed Falls evaluation completed;Education provided;Falls prevention discussed      Functional Status Survey:      Assessment & Plan  Problem List Items Addressed This Visit       Cardiovascular and Mediastinum   Hypertension - Primary    Chronic, appears controlled  Patient reports readings are in goal at home - slightly elevated today in office She is seen regularly by Cardiology - medications are managed by them as well Currently taking  Losartan 10 mg PO every day, Chlorthalidone 25 mg PO every day Continue current regimen Continue monitoring BP at home  Follow up in 6 months or sooner if concerns arise        Relevant Orders   Comp Met (CMET)   CBC w/Diff     Endocrine   Diet-controlled diabetes mellitus (HCC)    Chronic, historic condition Currently taking Jardiance 10 mg PO daily, she reports she is not taking every day due to intermittent concerns for UTI/ yeast infections but does not seek evaluation for this due to abx reactions  Most recent A1c was 6.2- recheck today, Results to dictate further management   Will check urine and send for culture today - patient denies dysuria today (may have chronic colonization?) Reviewed importance of annual eye exams  Continue current regimen  Follow up in 3 months or sooner if concerns arise        Relevant Orders   HgB A1c   Lipid Profile   Urinalysis, Routine w reflex microscopic (Completed)      Genitourinary   Hypertensive kidney disease with CKD stage III (HCC)    Chronic, ongoing  She is taking Jardiance 10 mg PO daily - taking intermittent breaks due to concerns for UTIs/yeast infections Continue jardiance as tolerated  Recheck CMP today for monitoring  Follow up in 6 months or sooner if concerns arise          Other   Hyperlipidemia (Chronic)    Chronic, historic condition Recheck lipid panel today Recommend she reduce saturated fats in diet and exercise as tolerated  She is not able to tolerate statins so will need to manage with lifestyle for now  Follow up in 6 months or sooner if concerns arise        Relevant Orders   Lipid Profile   Other Visit Diagnoses     Pyuria       Relevant Orders   Microscopic Examination (Completed)   Urine Culture        Return in about 3 months (around 10/26/2023) for HLD, HTN, DM.   I, Iisha Soyars E  Luka Stohr, PA-C, have reviewed all documentation for this visit. The documentation on 07/27/23 for the exam, diagnosis, procedures, and orders are all accurate and complete.   Jacquelin Hawking, MHS, PA-C [facility]

## 2023-07-27 NOTE — Assessment & Plan Note (Signed)
Chronic, appears controlled  Patient reports readings are in goal at home - slightly elevated today in office She is seen regularly by Cardiology - medications are managed by them as well Currently taking  Losartan 10 mg PO every day, Chlorthalidone 25 mg PO every day Continue current regimen Continue monitoring BP at home  Follow up in 6 months or sooner if concerns arise

## 2023-07-27 NOTE — Assessment & Plan Note (Signed)
Chronic, ongoing  She is taking Jardiance 10 mg PO daily - taking intermittent breaks due to concerns for UTIs/yeast infections Continue jardiance as tolerated  Recheck CMP today for monitoring  Follow up in 6 months or sooner if concerns arise

## 2023-07-28 ENCOUNTER — Telehealth: Payer: Self-pay

## 2023-07-28 LAB — CBC WITH DIFFERENTIAL/PLATELET
Basophils Absolute: 0.1 10*3/uL (ref 0.0–0.2)
Basos: 1 %
EOS (ABSOLUTE): 0.2 10*3/uL (ref 0.0–0.4)
Eos: 3 %
Hematocrit: 43.2 % (ref 34.0–46.6)
Hemoglobin: 14 g/dL (ref 11.1–15.9)
Immature Grans (Abs): 0 10*3/uL (ref 0.0–0.1)
Immature Granulocytes: 0 %
Lymphocytes Absolute: 1.6 10*3/uL (ref 0.7–3.1)
Lymphs: 25 %
MCH: 30.1 pg (ref 26.6–33.0)
MCHC: 32.4 g/dL (ref 31.5–35.7)
MCV: 93 fL (ref 79–97)
Monocytes Absolute: 0.6 10*3/uL (ref 0.1–0.9)
Monocytes: 10 %
Neutrophils Absolute: 4 10*3/uL (ref 1.4–7.0)
Neutrophils: 61 %
Platelets: 353 10*3/uL (ref 150–450)
RBC: 4.65 x10E6/uL (ref 3.77–5.28)
RDW: 12.7 % (ref 11.7–15.4)
WBC: 6.6 10*3/uL (ref 3.4–10.8)

## 2023-07-28 LAB — LIPID PANEL
Chol/HDL Ratio: 3.1 ratio (ref 0.0–4.4)
Cholesterol, Total: 163 mg/dL (ref 100–199)
HDL: 52 mg/dL (ref >39)
LDL Chol Calc (NIH): 95 mg/dL (ref 0–99)
Triglycerides: 87 mg/dL (ref 0–149)
VLDL Cholesterol Cal: 16 mg/dL (ref 5–40)

## 2023-07-28 LAB — COMPREHENSIVE METABOLIC PANEL
ALT: 20 IU/L (ref 0–32)
AST: 19 IU/L (ref 0–40)
Albumin: 3.9 g/dL (ref 3.9–4.9)
Alkaline Phosphatase: 93 IU/L (ref 44–121)
BUN/Creatinine Ratio: 14 (ref 12–28)
BUN: 15 mg/dL (ref 8–27)
Bilirubin Total: 0.4 mg/dL (ref 0.0–1.2)
CO2: 25 mmol/L (ref 20–29)
Calcium: 9.3 mg/dL (ref 8.7–10.3)
Chloride: 100 mmol/L (ref 96–106)
Creatinine, Ser: 1.11 mg/dL — ABNORMAL HIGH (ref 0.57–1.00)
Globulin, Total: 3.6 g/dL (ref 1.5–4.5)
Glucose: 107 mg/dL — ABNORMAL HIGH (ref 70–99)
Potassium: 3.6 mmol/L (ref 3.5–5.2)
Sodium: 140 mmol/L (ref 134–144)
Total Protein: 7.5 g/dL (ref 6.0–8.5)
eGFR: 54 mL/min/{1.73_m2} — ABNORMAL LOW (ref >59)

## 2023-07-28 LAB — HEMOGLOBIN A1C
Est. average glucose Bld gHb Est-mCnc: 137 mg/dL
Hgb A1c MFr Bld: 6.4 % — ABNORMAL HIGH (ref 4.8–5.6)

## 2023-07-28 NOTE — Telephone Encounter (Addendum)
Pt stated that she ask for an inhaler to be called in that was not as expensive. SHe mentioned Breo. No notation of that in chart and advised pt would send note for provider to read.

## 2023-07-28 NOTE — Progress Notes (Signed)
Your labs are back Your A1c was 6.4 which is stable and in goal - please continue with your current medications  Your kidney function appears to have dropped a bit from when it was last checked. Make sure you are staying well hydrated, taking your BP medications and keeping your glucose levels in goal ranges. We will recheck this at your next apt Your cholesterol looks good.  Your CBC was normal- no signs of anemia  I have sent a urine sample for culture to see if you have a UTI- if you are having pain with urination, increased urinary frequency or urgency please let us know and I will send in an antibiotic as there was a lot of bacteria in your urine.  Please let us know if you have further questions or concerns.

## 2023-07-31 DIAGNOSIS — R8281 Pyuria: Secondary | ICD-10-CM | POA: Diagnosis not present

## 2023-07-31 NOTE — Telephone Encounter (Signed)
She said in her apt that the Virgel Bouquet was too expensive so she hasn't been using it as directed. If she wants something that her insurance will provider better coverage for, she will need to call them and figure out what options would be better in terms of cost and coverage.

## 2023-07-31 NOTE — Telephone Encounter (Signed)
Spoke with patient to make her aware of Erin Mecum, PA recommendations. Patient says she spoke with Page, from [employer] and was informed some of the inhaler prices had been cut down to $35, but the only way to see if Virgel Bouquet is a part of the deal is it send a new prescription to the pharmacy. Patient was also made aware if the prescription is not covered or fall under the $35 price range, then she would need to reach out to her insurance and inquire about which medications are covered or affordable for the patient. Patient verbalized understanding and has no further questions at this time.

## 2023-08-01 ENCOUNTER — Other Ambulatory Visit: Payer: Self-pay | Admitting: Physician Assistant

## 2023-08-01 MED ORDER — FLUTICASONE FUROATE-VILANTEROL 100-25 MCG/ACT IN AEPB: 1.0000 | INHALATION_SPRAY | Freq: Every day | RESPIRATORY_TRACT | 2 refills | Status: DC

## 2023-08-04 MED ORDER — FOSFOMYCIN TROMETHAMINE 3 G PO PACK: 3.0000 g | PACK | Freq: Once | ORAL | 0 refills | Status: AC

## 2023-08-04 NOTE — Progress Notes (Signed)
Your urine culture was positive for E.coli  If you have been having UTI symptoms I recommend that we treat this with an antibiotic. If you have not had any symptoms at all, we can likely monitor it for now.  Let us know if you would like [place] to send in an antibiotic or if you want to hold off on this.

## 2023-08-04 NOTE — Addendum Note (Signed)
Addended by: Jacquelin Hawking on: 08/04/2023 03:34 PM   Modules accepted: Orders

## 2023-08-04 NOTE — Progress Notes (Signed)
I have sent in Fosfomycin for her. This is a one dose antibiotic that should help clear the UTI. She will just need to mix it with water as directed on the package and drink  If she starts to have signs of an allergic reaction she should go to the ED for prompt evaluation.

## 2023-08-07 ENCOUNTER — Other Ambulatory Visit: Payer: Self-pay | Admitting: Physician Assistant

## 2023-08-07 DIAGNOSIS — R319 Hematuria, unspecified: Secondary | ICD-10-CM

## 2023-08-07 MED ORDER — NITROFURANTOIN MONOHYD MACRO 100 MG PO CAPS
100.0000 mg | ORAL_CAPSULE | Freq: Two times a day (BID) | ORAL | 0 refills | Status: AC
Start: 2023-08-07 — End: 2023-08-14

## 2023-08-07 NOTE — Progress Notes (Signed)
Sent in macrobid instead for UTI

## 2023-08-08 ENCOUNTER — Telehealth: Payer: Self-pay | Admitting: Nurse Practitioner

## 2023-08-08 NOTE — Telephone Encounter (Signed)
Copied from CRM [id number]. Topic: General - Other >> Aug 07, 2023  4:20 PM Dondra Prader E wrote: Reason for CRM: Pt returned call stating that she was asked to report when she received her antibiotic she also has benadryl in case she reacts

## 2023-08-29 DIAGNOSIS — N1832 Chronic kidney disease, stage 3b: Secondary | ICD-10-CM | POA: Diagnosis not present

## 2023-08-29 DIAGNOSIS — I429 Cardiomyopathy, unspecified: Secondary | ICD-10-CM | POA: Diagnosis not present

## 2023-08-29 DIAGNOSIS — I5032 Chronic diastolic (congestive) heart failure: Secondary | ICD-10-CM | POA: Diagnosis not present

## 2023-08-29 DIAGNOSIS — R0602 Shortness of breath: Secondary | ICD-10-CM | POA: Diagnosis not present

## 2023-08-29 DIAGNOSIS — Z8673 Personal history of transient ischemic attack (TIA), and cerebral infarction without residual deficits: Secondary | ICD-10-CM | POA: Diagnosis not present

## 2023-08-29 DIAGNOSIS — G4733 Obstructive sleep apnea (adult) (pediatric): Secondary | ICD-10-CM | POA: Diagnosis not present

## 2023-08-29 DIAGNOSIS — I495 Sick sinus syndrome: Secondary | ICD-10-CM | POA: Diagnosis not present

## 2023-08-29 DIAGNOSIS — I10 Essential (primary) hypertension: Secondary | ICD-10-CM | POA: Diagnosis not present

## 2023-08-29 DIAGNOSIS — Z950 Presence of cardiac pacemaker: Secondary | ICD-10-CM | POA: Diagnosis not present

## 2023-08-29 DIAGNOSIS — I509 Heart failure, unspecified: Secondary | ICD-10-CM | POA: Diagnosis not present

## 2023-08-29 DIAGNOSIS — Z45010 Encounter for checking and testing of cardiac pacemaker pulse generator [battery]: Secondary | ICD-10-CM | POA: Diagnosis not present

## 2023-09-02 ENCOUNTER — Other Ambulatory Visit: Payer: Self-pay | Admitting: Nurse Practitioner

## 2023-09-04 NOTE — Telephone Encounter (Signed)
Requested Prescriptions  Pending Prescriptions Disp Refills   ACCU-CHEK GUIDE test strip [Pharmacy Med Name: ACCU-CHEK GUIDE TEST STRIP 100] 100 strip 0    Sig: Use to check blood sugar 3 times a day and document for visits. Goal is <130 fasting and <180 two hours after meal.,     Endocrinology: Diabetes - Testing Supplies Passed - 09/02/2023 11:10 AM      Passed - Valid encounter within last 12 months    Recent Outpatient Visits           1 month ago Primary hypertension   [place] [facility] Mecum, Erin E, PA-C   5 months ago Statin myopathy   [facility] [place], Megan P, DO   7 months ago Annual physical exam   [place] [facility] Larae Grooms, NP   9 months ago Hypertensive kidney disease with stage 3b chronic kidney disease (HCC)   [place] [facility] Gabriel Cirri, NP   11 months ago History of COVID-19   [place] [facility] [place], Dorie Rank, NP       Future Appointments             In 1 month Larae Grooms, NP [place] [facility]

## 2023-09-18 ENCOUNTER — Telehealth: Payer: Self-pay | Admitting: Nurse Practitioner

## 2023-09-18 ENCOUNTER — Other Ambulatory Visit: Payer: Self-pay | Admitting: Nurse Practitioner

## 2023-09-18 DIAGNOSIS — I495 Sick sinus syndrome: Secondary | ICD-10-CM

## 2023-09-18 DIAGNOSIS — I6322 Cerebral infarction due to unspecified occlusion or stenosis of basilar arteries: Secondary | ICD-10-CM

## 2023-09-18 NOTE — Telephone Encounter (Signed)
Pt's husband dropped paperwork off to be filled out by provider for assistance with medication of Jardiance.  Put in provider's folder.

## 2023-09-18 NOTE — Telephone Encounter (Signed)
Placed in provider folder to be reviewed

## 2023-09-18 NOTE — Telephone Encounter (Signed)
Form completed.

## 2023-09-19 NOTE — Telephone Encounter (Signed)
Form faxed per [place], CMA.

## 2023-09-19 NOTE — Telephone Encounter (Signed)
Requested Prescriptions  Pending Prescriptions Disp Refills   potassium chloride SA (KLOR-CON M) 20 MEQ tablet [Pharmacy Med Name: POTASSIUM CL ER 20 MEQ TABLET] 180 tablet 0    Sig: Take 1 tablet (20 mEq total) by mouth 2 (two) times daily.     Endocrinology:  Minerals - Potassium Supplementation Failed - 09/18/2023  7:01 AM      Failed - Cr in normal range and within 360 days    Creatinine  Date Value Ref Range Status  07/12/2014 1.37 (H) 0.60 - 1.30 mg/dL Final   Creatinine, Ser  Date Value Ref Range Status  07/27/2023 1.11 (H) 0.57 - 1.00 mg/dL Final         Passed - K in normal range and within 360 days    Potassium  Date Value Ref Range Status  07/27/2023 3.6 3.5 - 5.2 mmol/L Final  07/12/2014 3.4 (L) 3.5 - 5.1 mmol/L Final         Passed - Valid encounter within last 12 months    Recent Outpatient Visits           1 month ago Primary hypertension   [place] [facility] Mecum, Erin E, PA-C   6 months ago Statin myopathy   [place] [facility] [place], Megan P, DO   7 months ago Annual physical exam   [place] [facility] Larae Grooms, NP   9 months ago Hypertensive kidney disease with stage 3b chronic kidney disease (HCC)   [place] [facility] Gabriel Cirri, NP   1 year ago History of COVID-19   [place] [facility] [place], Corrie Dandy T, NP       Future Appointments             In 1 month Larae Grooms, NP [place] [facility], [facility]             clopidogrel (PLAVIX) 75 MG tablet [Pharmacy Med Name: CLOPIDOGREL 75 MG TABLET] 90 tablet 0    Sig: Take 1 tablet (75 mg total) by mouth daily.     Hematology: Antiplatelets - clopidogrel Failed - 09/18/2023  7:01 AM      Failed - Cr in normal range and within 360 days    Creatinine  Date Value Ref Range Status  07/12/2014 1.37 (H) 0.60 - 1.30 mg/dL Final   Creatinine, Ser  Date Value Ref Range Status   07/27/2023 1.11 (H) 0.57 - 1.00 mg/dL Final         Passed - HCT in normal range and within 180 days    Hematocrit  Date Value Ref Range Status  07/27/2023 43.2 34.0 - 46.6 % Final         Passed - HGB in normal range and within 180 days    Hemoglobin  Date Value Ref Range Status  07/27/2023 14.0 11.1 - 15.9 g/dL Final         Passed - PLT in normal range and within 180 days    Platelets  Date Value Ref Range Status  07/27/2023 353 150 - 450 x10E3/uL Final         Passed - Valid encounter within last 6 months    Recent Outpatient Visits           1 month ago Primary hypertension   [place] [facility] Mecum, Erin E, PA-C   6 months ago Statin myopathy   [place] [facility] [place], [place],  DO   7 months ago Annual physical exam   [place] [facility] Larae Grooms, NP   9 months ago Hypertensive kidney disease with stage 3b chronic kidney disease (HCC)   [place] [facility] Gabriel Cirri, NP   1 year ago History of COVID-19   [place] [facility] Marjie Skiff, NP       Future Appointments             In 1 month Larae Grooms, NP [place] [facility], [facility]

## 2023-10-02 ENCOUNTER — Other Ambulatory Visit: Payer: Self-pay | Admitting: Nurse Practitioner

## 2023-10-03 NOTE — Telephone Encounter (Signed)
Requested medication (s) are due for refill today: Yes  Requested medication (s) are on the active medication list: Yes  Last refill:  09/04/23  Future visit scheduled: Yes  Notes to clinic:  See request.    Requested Prescriptions  Pending Prescriptions Disp Refills   ACCU-CHEK GUIDE TEST test strip [Pharmacy Med Name: ACCU-CHEK GUIDE TEST STRIP 100] 100 strip 0    Sig: Use to check blood sugar 3 times a day and document for visits. Goal is <130 fasting and <180 two hours after meal.,     There is no refill protocol information for this order

## 2023-10-26 ENCOUNTER — Encounter: Payer: Self-pay | Admitting: Nurse Practitioner

## 2023-10-26 ENCOUNTER — Ambulatory Visit (INDEPENDENT_AMBULATORY_CARE_PROVIDER_SITE_OTHER): Payer: Medicare Other | Admitting: Nurse Practitioner

## 2023-10-26 VITALS — BP 120/80 | HR 78 | Temp 98.5°F | Ht 61.0 in | Wt 382.0 lb

## 2023-10-26 DIAGNOSIS — I495 Sick sinus syndrome: Secondary | ICD-10-CM

## 2023-10-26 DIAGNOSIS — N1832 Chronic kidney disease, stage 3b: Secondary | ICD-10-CM | POA: Diagnosis not present

## 2023-10-26 DIAGNOSIS — I129 Hypertensive chronic kidney disease with stage 1 through stage 4 chronic kidney disease, or unspecified chronic kidney disease: Secondary | ICD-10-CM | POA: Diagnosis not present

## 2023-10-26 DIAGNOSIS — E119 Type 2 diabetes mellitus without complications: Secondary | ICD-10-CM

## 2023-10-26 DIAGNOSIS — Z7984 Long term (current) use of oral hypoglycemic drugs: Secondary | ICD-10-CM

## 2023-10-26 DIAGNOSIS — D352 Benign neoplasm of pituitary gland: Secondary | ICD-10-CM

## 2023-10-26 DIAGNOSIS — Z95811 Presence of heart assist device: Secondary | ICD-10-CM | POA: Diagnosis not present

## 2023-10-26 DIAGNOSIS — I509 Heart failure, unspecified: Secondary | ICD-10-CM

## 2023-10-26 NOTE — Progress Notes (Signed)
BP 120/80   Pulse 78   Temp 98.5 F (36.9 C) (Oral)   Ht 5\' 1"  (1.549 m)   Wt (!) 382 lb (173.3 kg)   SpO2 98%   BMI 72.18 kg/m    Subjective:    Patient ID: Mary Webb, female    DOB: 1956-03-26, 67 y.o.   MRN: [id number]  HPI: Mary Webb is a 67 y.o. female  Chief Complaint  Patient presents with   3 month follow up   HYPERTENSION / HYPERLIPIDEMIA Had a new Pacemaker in March Satisfied with current treatment? no Duration of hypertension: years BP monitoring frequency: occasional BP range: 120/80 BP medication side effects: no Past BP meds: chlorthalidone and losartan (cozaar) Duration of hyperlipidemia: years Cholesterol medication side effects: no Cholesterol supplements: none Past cholesterol medications: none Medication compliance: excellent compliance Aspirin: no Recent stressors: no Recurrent headaches: no Visual changes: no Palpitations: no Dyspnea: sometimes Chest pain: no Lower extremity edema: no Dizzy/lightheaded: no  Patient's mother recently passed away.  She has been having some grief related to her passing. Is considering counseling but not ready yet.  Does not want medication.    Relevant past medical, surgical, family and social history reviewed and updated as indicated. Interim medical history since our last visit reviewed. Allergies and medications reviewed and updated.  Review of Systems  Eyes:  Negative for visual disturbance.  Respiratory:  Negative for cough, chest tightness and shortness of breath.   Cardiovascular:  Negative for chest pain, palpitations and leg swelling.  Neurological:  Negative for dizziness and headaches.  Psychiatric/Behavioral:         Grief    Per HPI unless specifically indicated above     Objective:    BP 120/80   Pulse 78   Temp 98.5 F (36.9 C) (Oral)   Ht 5\' 1"  (1.549 m)   Wt (!) 382 lb (173.3 kg)   SpO2 98%   BMI 72.18 kg/m   Wt Readings from Last 3 Encounters:  10/26/23 (!)  382 lb (173.3 kg)  07/27/23 (!) 372 lb 9.6 oz (169 kg)  03/16/23 (!) 334 lb 3.2 oz (151.6 kg)    Physical Exam Vitals and nursing note reviewed.  Constitutional:      General: She is not in acute distress.    Appearance: Normal appearance. She is obese. She is not ill-appearing, toxic-appearing or diaphoretic.  HENT:     Head: Normocephalic.     Right Ear: External ear normal.     Left Ear: External ear normal.     Nose: Nose normal.     Mouth/Throat:     Mouth: Mucous membranes are moist.     Pharynx: Oropharynx is clear.  Eyes:     General:        Right eye: No discharge.        Left eye: No discharge.     Extraocular Movements: Extraocular movements intact.     Conjunctiva/sclera: Conjunctivae normal.     Pupils: Pupils are equal, round, and reactive to light.  Cardiovascular:     Rate and Rhythm: Normal rate and regular rhythm.     Heart sounds: No murmur heard. Pulmonary:     Effort: Pulmonary effort is normal. No respiratory distress.     Breath sounds: Normal breath sounds. No wheezing or rales.  Musculoskeletal:     Cervical back: Normal range of motion and neck supple.  Skin:    General: Skin is warm and dry.  Capillary Refill: Capillary refill takes less than 2 seconds.  Neurological:     General: No focal deficit present.     Mental Status: She is alert and oriented to person, place, and time. Mental status is at baseline.  Psychiatric:        Mood and Affect: Mood normal.        Behavior: Behavior normal.        Thought Content: Thought content normal.        Judgment: Judgment normal.     Results for orders placed or performed in visit on 08/04/23  HM DIABETES EYE EXAM   Collection Time: 06/14/23 12:00 AM  Result Value Ref Range   HM Diabetic Eye Exam No Retinopathy No Retinopathy      Assessment & Plan:   Problem List Items Addressed This Visit       Cardiovascular and Mediastinum   Sick sinus syndrome (HCC) (Chronic)   New pacemaker placed  in March 2024.  No current concerns regarding implanted device.      Relevant Orders   Lipid Profile   Congestive heart failure, unspecified HF chronicity, unspecified heart failure type (HCC)   Chronic.  Appears to be controlled at this time.  Euvolemic in office today.  - Reminded to call for an overnight weight gain of >2 pounds or a weekly weight gain of >5 pounds - not adding Webb to food and read food labels. Reviewed the importance of keeping daily sodium intake to 2000mg  daily. - Avoid Ibuprofen products.         Endocrine   Pituitary adenoma (HCC) (Chronic)   Chronic.  No new neurologic issues. Continue plavix and lifestyle modifications.  Will continue to reassess at future visits.      Relevant Orders   Lipid Profile   Diet-controlled diabetes mellitus (HCC) - Primary   Chronic.  Controlled.  Lasts A1c was 6.4%.  Continue with current medication regimen of Jardiance.   Labs ordered today.  Return to clinic in 6 months for reevaluation.  Call sooner if concerns arise.        Relevant Orders   Lipid Profile   HgB A1c     Genitourinary   Hypertensive kidney disease with CKD stage III (HCC)   Chronic, ongoing  She is taking Jardiance 10 mg PO daily  Continue jardiance as tolerated  Recheck CMP today for monitoring  Follow up in 6 months or sooner if concerns arise       Relevant Orders   Comp Met (CMET)     Other   Morbid obesity (HCC)   Recommended eating smaller high protein, low fat meals more frequently and exercising 30 mins a day 5 times a week with a goal of 10-15lb weight loss in the next 3 months.       Relevant Orders   Lipid Profile   DME Bedside commode   Presence of heart assist device [facility])   New pacemaker placed in March 2024.  No current concerns regarding implanted device.         Follow up plan: Return in about 6 months (around 04/25/2024) for HTN, HLD, DM2 FU.

## 2023-10-26 NOTE — Assessment & Plan Note (Signed)
Chronic.  Controlled.  Lasts A1c was 6.4%.  Continue with current medication regimen of Jardiance.   Labs ordered today.  Return to clinic in 6 months for reevaluation.  Call sooner if concerns arise.

## 2023-10-26 NOTE — Assessment & Plan Note (Signed)
Chronic, ongoing  She is taking Jardiance 10 mg PO daily  Continue jardiance as tolerated  Recheck CMP today for monitoring  Follow up in 6 months or sooner if concerns arise

## 2023-10-26 NOTE — Assessment & Plan Note (Signed)
Recommended eating smaller high protein, low fat meals more frequently and exercising 30 mins a day 5 times a week with a goal of 10-15lb weight loss in the next 3 months.  

## 2023-10-26 NOTE — Assessment & Plan Note (Signed)
Chronic.  No new neurologic issues. Continue plavix and lifestyle modifications.  Will continue to reassess at future visits.

## 2023-10-26 NOTE — Assessment & Plan Note (Signed)
New pacemaker placed in March 2024.  No current concerns regarding implanted device.

## 2023-10-26 NOTE — Assessment & Plan Note (Signed)
Chronic.  Appears to be controlled at this time.  Euvolemic in office today.  - Reminded to call for an overnight weight gain of >2 pounds or a weekly weight gain of >5 pounds - not adding salt to food and read food labels. Reviewed the importance of keeping daily sodium intake to 2000mg  daily. - Avoid Ibuprofen products.

## 2023-10-27 LAB — COMPREHENSIVE METABOLIC PANEL
ALT: 18 [IU]/L (ref 0–32)
AST: 21 [IU]/L (ref 0–40)
Albumin: 4 g/dL (ref 3.9–4.9)
Alkaline Phosphatase: 95 [IU]/L (ref 44–121)
BUN/Creatinine Ratio: 9 — ABNORMAL LOW (ref 12–28)
BUN: 10 mg/dL (ref 8–27)
Bilirubin Total: 0.4 mg/dL (ref 0.0–1.2)
CO2: 25 mmol/L (ref 20–29)
Calcium: 9.1 mg/dL (ref 8.7–10.3)
Chloride: 102 mmol/L (ref 96–106)
Creatinine, Ser: 1.12 mg/dL — ABNORMAL HIGH (ref 0.57–1.00)
Globulin, Total: 3.7 g/dL (ref 1.5–4.5)
Glucose: 113 mg/dL — ABNORMAL HIGH (ref 70–99)
Potassium: 4 mmol/L (ref 3.5–5.2)
Sodium: 142 mmol/L (ref 134–144)
Total Protein: 7.7 g/dL (ref 6.0–8.5)
eGFR: 54 mL/min/{1.73_m2} — ABNORMAL LOW (ref >59)

## 2023-10-27 LAB — LIPID PANEL
Chol/HDL Ratio: 3.1 {ratio} (ref 0.0–4.4)
Cholesterol, Total: 167 mg/dL (ref 100–199)
HDL: 54 mg/dL (ref >39)
LDL Chol Calc (NIH): 97 mg/dL (ref 0–99)
Triglycerides: 89 mg/dL (ref 0–149)
VLDL Cholesterol Cal: 16 mg/dL (ref 5–40)

## 2023-10-27 LAB — HEMOGLOBIN A1C
Est. average glucose Bld gHb Est-mCnc: 140 mg/dL
Hgb A1c MFr Bld: 6.5 % — ABNORMAL HIGH (ref 4.8–5.6)

## 2023-10-31 ENCOUNTER — Other Ambulatory Visit: Payer: Self-pay | Admitting: Nurse Practitioner

## 2023-10-31 NOTE — Telephone Encounter (Signed)
Requested Prescriptions  Pending Prescriptions Disp Refills   ACCU-CHEK GUIDE TEST test strip [Pharmacy Med Name: ACCU-CHEK GUIDE TEST STRIP 100] 100 strip 0    Sig: Use to check blood sugar 3 times a day and document for visits. Goal is <130 fasting and <180 two hours after meal.,     There is no refill protocol information for this order

## 2023-11-22 ENCOUNTER — Telehealth: Payer: Self-pay | Admitting: Nurse Practitioner

## 2023-11-22 ENCOUNTER — Other Ambulatory Visit: Payer: Self-pay

## 2023-11-22 MED ORDER — JARDIANCE 10 MG PO TABS: 10.0000 mg | ORAL_TABLET | Freq: Every day | ORAL | 4 refills | Status: DC

## 2023-11-22 NOTE — Progress Notes (Addendum)
   11/22/2023  Patient ID: Shanda DELENA Kluver, female   DOB: 14-Feb-1956, 68 y.o.   MRN: [id number]  Contacted [employer] to check on processing of patient's recently submitted for Jardiance  10mg  daily.  Office note reflects application was faxed into the program 11/5 but had not gone into processing.  Application is missing prescription to be able to complete processing.  I am pending a prescription to go to [facility] and will follow-up to check on the status of the patient's application again Friday.  Attempted to inform patient, but I was not able to reach her- I will try to call her again Friday after I have an update on processing status.  Channing DELENA Mealing, PharmD, DPLA

## 2023-11-22 NOTE — Telephone Encounter (Signed)
 Copied from CRM [id number]. Topic: General - Inquiry >> Nov 21, 2023  3:06 PM Clide Dales wrote: Patient called to get an update on her application for assistance with Jardiance. Please advise.

## 2023-11-27 ENCOUNTER — Telehealth: Payer: Self-pay | Admitting: Nurse Practitioner

## 2023-11-27 NOTE — Telephone Encounter (Signed)
 Pt is calling in because she needs PCP to call or fax the prescription page to [facility] so she can get the assistance she needs for Jardiance . They said they received everything except the prescription page. Pt says to request to speak with the pharmacy team. Phone: [phone] [phone]

## 2023-11-28 ENCOUNTER — Other Ambulatory Visit: Payer: Self-pay | Admitting: Nurse Practitioner

## 2023-11-28 NOTE — Progress Notes (Signed)
   11/28/2023  Patient ID: Mary Webb, female   DOB: Dec 24, 1955, 68 y.o.   MRN: [id number]  Clinic routed request stating patient was told [employer] PAP is needing precription for Jardiance , but this was e-prescribed 1/8.  Contacted [employer], and they state pages 5 must still be completed and faxed to the program.  Faxing these to [employer] to be completed and faxed into company.  Channing DELENA Mealing, PharmD, DPLA

## 2023-11-29 NOTE — Telephone Encounter (Signed)
 Requested Prescriptions  Pending Prescriptions Disp Refills   glucose blood (ACCU-CHEK GUIDE TEST) test strip [Pharmacy Med Name: ACCU-CHEK GUIDE TEST STRIP 100] 300 strip 1    Sig: Use to check blood sugar 3 times a day and document for visits. Goal is <130 fasting and <180 two hours after meal.,     There is no refill protocol information for this order

## 2023-11-30 NOTE — Progress Notes (Signed)
   11/30/2023  Patient ID: Mary Webb, female   DOB: 10/05/1956, 68 y.o.   MRN: [id number]  Information that [facility] C[employer] patient assistance program stated was missing from patient's application for Jardiance 10 mg was faxed into the program last week.  Contacted [employer] today to follow-up on processing status of 2025 reenrollment application.  Patient has been approved to receive Jardiance 10 mg through the end of this year.  Contacted patient to make her aware, and she still has medication on hand; she should receive her next refill prior to running low/out of this medication.  Told her to contact me if she does begin to run low and has not let not yet received refill.  Lenna Gilford, PharmD, DPLA

## 2023-12-15 ENCOUNTER — Other Ambulatory Visit: Payer: Self-pay | Admitting: Nurse Practitioner

## 2023-12-15 DIAGNOSIS — I495 Sick sinus syndrome: Secondary | ICD-10-CM

## 2023-12-15 DIAGNOSIS — I6322 Cerebral infarction due to unspecified occlusion or stenosis of basilar arteries: Secondary | ICD-10-CM

## 2023-12-15 NOTE — Telephone Encounter (Signed)
Cr in date.  Requested Prescriptions  Pending Prescriptions Disp Refills   potassium chloride SA (KLOR-CON M) 20 MEQ tablet [Pharmacy Med Name: POTASSIUM CL ER 20 MEQ TABLET] 180 tablet 0    Sig: Take 1 tablet (20 mEq total) by mouth 2 (two) times daily.     Endocrinology:  Minerals - Potassium Supplementation Failed - 12/15/2023  3:47 PM      Failed - Cr in normal range and within 360 days    Creatinine  Date Value Ref Range Status  07/12/2014 1.37 (H) 0.60 - 1.30 mg/dL Final   Creatinine, Ser  Date Value Ref Range Status  10/26/2023 1.12 (H) 0.57 - 1.00 mg/dL Final         Passed - K in normal range and within 360 days    Potassium  Date Value Ref Range Status  10/26/2023 4.0 3.5 - 5.2 mmol/L Final  07/12/2014 3.4 (L) 3.5 - 5.1 mmol/L Final         Passed - Valid encounter within last 12 months    Recent Outpatient Visits           1 month ago Diet-controlled diabetes mellitus (HCC)   [place] [facility] Larae Grooms, NP   4 months ago Primary hypertension   [place] [facility] Mecum, Oswaldo Conroy, PA-C   9 months ago Statin myopathy   [place] [facility] [place], Megan P, DO   10 months ago Annual physical exam   [place] [facility] Larae Grooms, NP   1 year ago Hypertensive kidney disease with stage 3b chronic kidney disease (HCC)   [place] [facility] Gabriel Cirri, NP       Future Appointments             In 1 month Larae Grooms, NP [place] [facility]             clopidogrel (PLAVIX) 75 MG tablet [Pharmacy Med Name: CLOPIDOGREL 75 MG TABLET] 90 tablet 0    Sig: Take 1 tablet (75 mg total) by mouth daily.     Hematology: Antiplatelets - clopidogrel Failed - 12/15/2023  3:47 PM      Failed - Cr in normal range and within 360 days    Creatinine  Date Value Ref Range Status  07/12/2014 1.37 (H) 0.60 - 1.30 mg/dL Final   Creatinine, Ser   Date Value Ref Range Status  10/26/2023 1.12 (H) 0.57 - 1.00 mg/dL Final         Passed - HCT in normal range and within 180 days    Hematocrit  Date Value Ref Range Status  07/27/2023 43.2 34.0 - 46.6 % Final         Passed - HGB in normal range and within 180 days    Hemoglobin  Date Value Ref Range Status  07/27/2023 14.0 11.1 - 15.9 g/dL Final         Passed - PLT in normal range and within 180 days    Platelets  Date Value Ref Range Status  07/27/2023 353 150 - 450 x10E3/uL Final         Passed - Valid encounter within last 6 months    Recent Outpatient Visits           1 month ago Diet-controlled diabetes mellitus (HCC)   [place] [facility] Larae Grooms, NP   4 months ago Primary hypertension   [place] [address]  [facility] Mecum, Erin E, PA-C   9 months ago Statin myopathy   [place] [facility] [place], Megan P, DO   10 months ago Annual physical exam   [place] [facility] Larae Grooms, NP   1 year ago Hypertensive kidney disease with stage 3b chronic kidney disease (HCC)   [place] [facility] Gabriel Cirri, NP       Future Appointments             In 1 month Larae Grooms, NP [place] [facility]

## 2024-01-24 ENCOUNTER — Encounter: Payer: Self-pay | Admitting: Nurse Practitioner

## 2024-01-24 ENCOUNTER — Ambulatory Visit (INDEPENDENT_AMBULATORY_CARE_PROVIDER_SITE_OTHER): Payer: Medicare Other | Admitting: Nurse Practitioner

## 2024-01-24 VITALS — BP 132/69 | HR 74 | Ht 61.0 in | Wt 383.4 lb

## 2024-01-24 DIAGNOSIS — D352 Benign neoplasm of pituitary gland: Secondary | ICD-10-CM

## 2024-01-24 DIAGNOSIS — I129 Hypertensive chronic kidney disease with stage 1 through stage 4 chronic kidney disease, or unspecified chronic kidney disease: Secondary | ICD-10-CM

## 2024-01-24 DIAGNOSIS — E7800 Pure hypercholesterolemia, unspecified: Secondary | ICD-10-CM | POA: Diagnosis not present

## 2024-01-24 DIAGNOSIS — I10 Essential (primary) hypertension: Secondary | ICD-10-CM

## 2024-01-24 DIAGNOSIS — N1832 Chronic kidney disease, stage 3b: Secondary | ICD-10-CM | POA: Diagnosis not present

## 2024-01-24 DIAGNOSIS — Z1231 Encounter for screening mammogram for malignant neoplasm of breast: Secondary | ICD-10-CM | POA: Diagnosis not present

## 2024-01-24 DIAGNOSIS — I495 Sick sinus syndrome: Secondary | ICD-10-CM | POA: Diagnosis not present

## 2024-01-24 DIAGNOSIS — I509 Heart failure, unspecified: Secondary | ICD-10-CM

## 2024-01-24 DIAGNOSIS — Z7984 Long term (current) use of oral hypoglycemic drugs: Secondary | ICD-10-CM

## 2024-01-24 DIAGNOSIS — E119 Type 2 diabetes mellitus without complications: Secondary | ICD-10-CM | POA: Diagnosis not present

## 2024-01-24 LAB — MICROALBUMIN, URINE WAIVED
Creatinine, Urine Waived: 200 mg/dL (ref 10–300)
Microalb, Ur Waived: 30 mg/L — ABNORMAL HIGH (ref 0–19)
Microalb/Creat Ratio: <30 mg/g (ref <30)

## 2024-01-24 NOTE — Progress Notes (Signed)
 BP 132/69 (BP Location: Left Arm, Patient Position: Sitting, Cuff Size: Large)   Pulse 74   Ht 5\' 1"  (1.549 m)   Wt (!) 383 lb 6.4 oz (173.9 kg)   SpO2 95%   BMI 72.44 kg/m    Subjective:    Patient ID: [name], female    DOB: [birth year], 68 y.o.   MRN: [id number]  HPI: Mary Webb is a 68 y.o. female  Chief Complaint  Patient presents with   3 month follow up   HYPERTENSION / HYPERLIPIDEMIA Had a new Pacemaker in March 2024.  Followed by Dr. Juliann Pares.   Satisfied with current treatment? no Duration of hypertension: years BP monitoring frequency: occasional BP range: 130/80 BP medication side effects: no Past BP meds: chlorthalidone and losartan (cozaar) Duration of hyperlipidemia: years Cholesterol medication side effects: no Cholesterol supplements: none Past cholesterol medications: none Medication compliance: excellent compliance Aspirin: no Recent stressors: no Recurrent headaches: no Visual changes: no Palpitations: no Dyspnea: better Chest pain: no Lower extremity edema: no Dizzy/lightheaded: no  DIABETES Taking Jardiance.  Hypoglycemic episodes:no Polydipsia/polyuria: no Visual disturbance: no Chest pain: no Paresthesias: no Glucose Monitoring: no  Accucheck frequency: daily  Fasting glucose: <110  Post prandial:  Evening:  Before meals: Taking Insulin?: no  Long acting insulin:  Short acting insulin: Blood Pressure Monitoring: weekly Retinal Examination: Up to Date Foot Exam: Up to Date Diabetic Education: Not Completed Pneumovax: Up to Date Influenza: Not up to Date Aspirin: yes   MOOD Patient states she has been pretty good.  Has good days and bad days.  She has been dealing with a lot of family stress.    Relevant past medical, surgical, family and social history reviewed and updated as indicated. Interim medical history since our last visit reviewed. Allergies and medications reviewed and updated.  Review of  Systems  Eyes:  Negative for visual disturbance.  Respiratory:  Positive for shortness of breath. Negative for cough and chest tightness.   Cardiovascular:  Negative for chest pain, palpitations and leg swelling.  Neurological:  Negative for dizziness and headaches.  Psychiatric/Behavioral:         Grief    Per HPI unless specifically indicated above     Objective:    BP 132/69 (BP Location: Left Arm, Patient Position: Sitting, Cuff Size: Large)   Pulse 74   Ht 5\' 1"  (1.549 m)   Wt (!) 383 lb 6.4 oz (173.9 kg)   SpO2 95%   BMI 72.44 kg/m   Wt Readings from Last 3 Encounters:  01/24/24 (!) 383 lb 6.4 oz (173.9 kg)  10/26/23 (!) 382 lb (173.3 kg)  07/27/23 (!) 372 lb 9.6 oz (169 kg)    Physical Exam Vitals and nursing note reviewed.  Constitutional:      General: She is not in acute distress.    Appearance: Normal appearance. She is obese. She is not ill-appearing, toxic-appearing or diaphoretic.  HENT:     Head: Normocephalic.     Right Ear: External ear normal.     Left Ear: External ear normal.     Nose: Nose normal.     Mouth/Throat:     Mouth: Mucous membranes are moist.     Pharynx: Oropharynx is clear.  Eyes:     General:        Right eye: No discharge.        Left eye: No discharge.     Extraocular Movements: Extraocular movements intact.  Conjunctiva/sclera: Conjunctivae normal.     Pupils: Pupils are equal, round, and reactive to light.  Cardiovascular:     Rate and Rhythm: Normal rate and regular rhythm.     Heart sounds: No murmur heard. Pulmonary:     Effort: Pulmonary effort is normal. No respiratory distress.     Breath sounds: Normal breath sounds. No wheezing or rales.  Musculoskeletal:     Cervical back: Normal range of motion and neck supple.     Comments: Uses a walker  Skin:    General: Skin is warm and dry.     Capillary Refill: Capillary refill takes less than 2 seconds.  Neurological:     General: No focal deficit present.      Mental Status: She is alert and oriented to person, place, and time. Mental status is at baseline.  Psychiatric:        Mood and Affect: Mood normal.        Behavior: Behavior normal.        Thought Content: Thought content normal.        Judgment: Judgment normal.     Results for orders placed or performed in visit on 10/26/23  Comp Met (CMET)   Collection Time: 10/26/23 10:54 AM  Result Value Ref Range   Glucose 113 (H) 70 - 99 mg/dL   BUN 10 8 - 27 mg/dL   Creatinine, Ser 1.24 (H) 0.57 - 1.00 mg/dL   eGFR 54 (L) >58 KD/XIP/3.82   BUN/Creatinine Ratio 9 (L) 12 - 28   Sodium 142 134 - 144 mmol/L   Potassium 4.0 3.5 - 5.2 mmol/L   Chloride 102 96 - 106 mmol/L   CO2 25 20 - 29 mmol/L   Calcium 9.1 8.7 - 10.3 mg/dL   Total Protein 7.7 6.0 - 8.5 g/dL   Albumin 4.0 3.9 - 4.9 g/dL   Globulin, Total 3.7 1.5 - 4.5 g/dL   Bilirubin Total 0.4 0.0 - 1.2 mg/dL   Alkaline Phosphatase 95 44 - 121 IU/L   AST 21 0 - 40 IU/L   ALT 18 0 - 32 IU/L  Lipid Profile   Collection Time: 10/26/23 10:54 AM  Result Value Ref Range   Cholesterol, Total 167 100 - 199 mg/dL   Triglycerides 89 0 - 149 mg/dL   HDL 54 >50 mg/dL   VLDL Cholesterol Cal 16 5 - 40 mg/dL   LDL Chol Calc (NIH) 97 0 - 99 mg/dL   Chol/HDL Ratio 3.1 0.0 - 4.4 ratio  HgB A1c   Collection Time: 10/26/23 10:54 AM  Result Value Ref Range   Hgb A1c MFr Bld 6.5 (H) 4.8 - 5.6 %   Est. average glucose Bld gHb Est-mCnc 140 mg/dL      Assessment & Plan:   Problem List Items Addressed This Visit       Cardiovascular and Mediastinum   Sick sinus syndrome (HCC) - Primary (Chronic)   New pacemaker placed in March 2024.  No current concerns regarding implanted device.  Followed by Dr. Juliann Pares.      Hypertension   Chronic, controlled. Patient reports readings are in goal at home - slightly elevated today in office She is seen regularly by Cardiology - medications are managed by them as well Currently taking  Losartan 10 mg PO  every day, Chlorthalidone 25 mg PO every day Continue current regimen Continue monitoring BP at home  Follow up in 6 months or sooner if concerns arise  Relevant Orders   Comprehensive metabolic panel   Congestive heart failure, unspecified HF chronicity, unspecified heart failure type (HCC)   Chronic.  Appears to be controlled at this time.  Euvolemic in office today.  - Reminded to call for an overnight weight gain of >2 pounds or a weekly weight gain of >5 pounds - not adding salt to food and read food labels. Reviewed the importance of keeping daily sodium intake to 2000mg  daily. - Avoid Ibuprofen products.        Endocrine   Pituitary adenoma (HCC) (Chronic)   Chronic.  No new neurologic issues. Continue plavix and lifestyle modifications.  Will continue to reassess at future visits.      Diet-controlled diabetes mellitus (HCC)   Chronic.  Controlled.  Lasts A1c was 6.5%.  Continue with current medication regimen of Jardiance.   She was previously diet controlled but the Jardiance is more for CKD and HF.  Sugars are running <110.  Labs ordered today.  Return to clinic in 6 months for reevaluation.  Call sooner if concerns arise.       Relevant Orders   Hemoglobin A1c   Comprehensive metabolic panel   Microalbumin, Urine Waived     Genitourinary   Hypertensive kidney disease with CKD stage III (HCC)   Chronic, ongoing  She is taking Jardiance 10 mg and Losartan 50mg  PO daily  Continue jardiance and Losartan as tolerated  Recheck CMP today for monitoring  Follow up in 6 months or sooner if concerns arise         Other   Hyperlipidemia (Chronic)   Chronic, controlled.  Recheck lipid panel today Recommend she reduce saturated fats in diet and exercise as tolerated  She is not able to tolerate statins so will need to manage with lifestyle for now  Follow up in 6 months or sooner if concerns arise       Relevant Orders   Lipid panel   Morbid obesity (HCC)    Recommended eating smaller high protein, low fat meals more frequently and exercising 30 mins a day 5 times a week with a goal of 10-15lb weight loss in the next 3 months.       Other Visit Diagnoses       Encounter for screening mammogram for malignant neoplasm of breast       Relevant Orders   MM 3D SCREENING MAMMOGRAM BILATERAL BREAST         Follow up plan: Return in about 3 months (around 04/25/2024) for Physical and Fasting labs.

## 2024-01-24 NOTE — Assessment & Plan Note (Signed)
 Chronic.  Appears to be controlled at this time.  Euvolemic in office today.  - Reminded to call for an overnight weight gain of >2 pounds or a weekly weight gain of >5 pounds - not adding salt to food and read food labels. Reviewed the importance of keeping daily sodium intake to 2000mg  daily. - Avoid Ibuprofen products.

## 2024-01-24 NOTE — Assessment & Plan Note (Signed)
 Recommended eating smaller high protein, low fat meals more frequently and exercising 30 mins a day 5 times a week with a goal of 10-15lb weight loss in the next 3 months.

## 2024-01-24 NOTE — Assessment & Plan Note (Signed)
 Chronic, controlled.  Recheck lipid panel today Recommend she reduce saturated fats in diet and exercise as tolerated  She is not able to tolerate statins so will need to manage with lifestyle for now  Follow up in 6 months or sooner if concerns arise

## 2024-01-24 NOTE — Patient Instructions (Signed)
 Please call to schedule your mammogram and/or bone density: [facility]  Address: [address], [place], [place] [address] Phone: [phone]  [facility] [address] [place],  [place]  [address] Phone: [phone]

## 2024-01-24 NOTE — Assessment & Plan Note (Signed)
 Chronic, controlled. Patient reports readings are in goal at home - slightly elevated today in office She is seen regularly by Cardiology - medications are managed by them as well Currently taking  Losartan 10 mg PO every day, Chlorthalidone 25 mg PO every day Continue current regimen Continue monitoring BP at home  Follow up in 6 months or sooner if concerns arise

## 2024-01-24 NOTE — Assessment & Plan Note (Signed)
 Chronic, ongoing  She is taking Jardiance 10 mg and Losartan 50mg  PO daily  Continue jardiance and Losartan as tolerated  Recheck CMP today for monitoring  Follow up in 6 months or sooner if concerns arise

## 2024-01-24 NOTE — Assessment & Plan Note (Addendum)
 Chronic.  Controlled.  Lasts A1c was 6.5%.  Continue with current medication regimen of Jardiance.   She was previously diet controlled but the Jardiance is more for CKD and HF.  Sugars are running <110.  Labs ordered today.  Return to clinic in 6 months for reevaluation.  Call sooner if concerns arise.

## 2024-01-24 NOTE — Assessment & Plan Note (Signed)
 New pacemaker placed in March 2024.  No current concerns regarding implanted device.  Followed by Dr. Juliann Pares.

## 2024-01-24 NOTE — Assessment & Plan Note (Signed)
Chronic.  No new neurologic issues. Continue plavix and lifestyle modifications.  Will continue to reassess at future visits.

## 2024-01-25 LAB — HEMOGLOBIN A1C
Est. average glucose Bld gHb Est-mCnc: 143 mg/dL
Hgb A1c MFr Bld: 6.6 % — ABNORMAL HIGH (ref 4.8–5.6)

## 2024-01-25 LAB — COMPREHENSIVE METABOLIC PANEL
ALT: 21 IU/L (ref 0–32)
AST: 25 IU/L (ref 0–40)
Albumin: 4.1 g/dL (ref 3.9–4.9)
Alkaline Phosphatase: 98 IU/L (ref 44–121)
BUN/Creatinine Ratio: 10 — ABNORMAL LOW (ref 12–28)
BUN: 12 mg/dL (ref 8–27)
Bilirubin Total: 0.4 mg/dL (ref 0.0–1.2)
CO2: 23 mmol/L (ref 20–29)
Calcium: 9.5 mg/dL (ref 8.7–10.3)
Chloride: 101 mmol/L (ref 96–106)
Creatinine, Ser: 1.19 mg/dL — ABNORMAL HIGH (ref 0.57–1.00)
Globulin, Total: 3.7 g/dL (ref 1.5–4.5)
Glucose: 113 mg/dL — ABNORMAL HIGH (ref 70–99)
Potassium: 4.1 mmol/L (ref 3.5–5.2)
Sodium: 140 mmol/L (ref 134–144)
Total Protein: 7.8 g/dL (ref 6.0–8.5)
eGFR: 50 mL/min/{1.73_m2} — ABNORMAL LOW (ref >59)

## 2024-01-25 LAB — LIPID PANEL
Chol/HDL Ratio: 3 ratio (ref 0.0–4.4)
Cholesterol, Total: 172 mg/dL (ref 100–199)
HDL: 58 mg/dL (ref >39)
LDL Chol Calc (NIH): 97 mg/dL (ref 0–99)
Triglycerides: 92 mg/dL (ref 0–149)
VLDL Cholesterol Cal: 17 mg/dL (ref 5–40)

## 2024-02-05 ENCOUNTER — Other Ambulatory Visit: Payer: Self-pay | Admitting: Physician Assistant

## 2024-02-06 NOTE — Telephone Encounter (Signed)
 Requested Prescriptions  Pending Prescriptions Disp Refills   gabapentin (NEURONTIN) 300 MG capsule [Pharmacy Med Name: GABAPENTIN 300 MG CAPSULE] 180 capsule 1    Sig: Take 1 capsule (300 mg total) by mouth 2 (two) times daily.     Neurology: Anticonvulsants - gabapentin Failed - 02/06/2024 11:30 AM      Failed - Cr in normal range and within 360 days    Creatinine  Date Value Ref Range Status  07/12/2014 1.37 (H) 0.60 - 1.30 mg/dL Final   Creatinine, Ser  Date Value Ref Range Status  01/24/2024 1.19 (H) 0.57 - 1.00 mg/dL Final         Passed - Completed PHQ-2 or PHQ-9 in the last 360 days      Passed - Valid encounter within last 12 months    Recent Outpatient Visits           3 months ago Diet-controlled diabetes mellitus (HCC)   [facility] Larae Grooms, NP   6 months ago Primary hypertension   [facility] Mecum, Oswaldo Conroy, PA-C   10 months ago Statin myopathy   [facility] [place], Megan [place], DO   1 year ago Annual physical exam   [facility] Larae Grooms, NP   1 year ago Hypertensive kidney disease with stage 3b chronic kidney disease (HCC)   [place] [facility] Gabriel Cirri, NP

## 2024-02-20 DIAGNOSIS — I495 Sick sinus syndrome: Secondary | ICD-10-CM | POA: Diagnosis not present

## 2024-03-05 ENCOUNTER — Ambulatory Visit: Payer: Self-pay | Admitting: Emergency Medicine

## 2024-03-05 ENCOUNTER — Telehealth: Payer: Self-pay | Admitting: Nurse Practitioner

## 2024-03-05 VITALS — Ht 62.0 in | Wt 370.0 lb

## 2024-03-05 DIAGNOSIS — Z0000 Encounter for general adult medical examination without abnormal findings: Secondary | ICD-10-CM | POA: Diagnosis not present

## 2024-03-05 NOTE — Telephone Encounter (Signed)
 Ok to order for the patient?

## 2024-03-05 NOTE — Telephone Encounter (Signed)
 Called patient to correct upcoming appt. She stated that she talked to the provider before provider left to go on maternity leave about a Bariatric Portable toilet (Large) and still needs it. Patient was seen last on 01/29/2023. Please advise. [facility] uses [facility] [place].

## 2024-03-05 NOTE — Patient Instructions (Addendum)
 [name] , Thank you for taking time to come for your Medicare Wellness Visit. I appreciate your ongoing commitment to your health goals. Please review the following plan we discussed and let me know if I can assist you in the future.   Referrals/Orders/Follow-Ups/Clinician Recommendations: Call [facility] @ [phone] to schedule a mammogram at your earliest convenience.  This is a list of the screening recommended for you and due dates:  Health Maintenance  Topic Date Due   Zoster (Shingles) Vaccine (1 of 2) Never done   Mammogram  05/10/2023   COVID-19 Vaccine (4 - 2024-25 season) 07/16/2023   Complete foot exam   03/15/2024   Eye exam for diabetics  06/13/2024   Flu Shot  06/14/2024   Hemoglobin A1C  07/26/2024   Yearly kidney function blood test for diabetes  01/23/2025   Yearly kidney health urinalysis for diabetes  01/23/2025   Medicare Annual Wellness Visit  03/05/2025   Colon Cancer Screening  01/04/2026   DEXA scan (bone density measurement)  05/10/2027   Pneumonia Vaccine  Completed   Hepatitis C Screening  Completed   HPV Vaccine  Aged Out   Meningitis B Vaccine  Aged Out   DTaP/Tdap/Td vaccine  Discontinued    Advanced directives: (Declined) Advance directive discussed with you today. Even though you declined this today, please call our office should you change your mind, and we can give you the proper paperwork for you to fill out.  Next Medicare Annual Wellness Visit scheduled for next year: Yes, 03/18/25 @ 8:40am (phone visit)

## 2024-03-05 NOTE — Telephone Encounter (Signed)
 Okay to order?

## 2024-03-05 NOTE — Progress Notes (Signed)
 Subjective:   Mary Webb is a 68 y.o. who presents for a Medicare Wellness preventive visit.  Visit Complete: Virtual I connected with  [name] on 03/05/24 by a audio enabled telemedicine application and verified that I am speaking with the correct person using two identifiers.  Patient Location: Home  Provider Location: Office/Clinic  I discussed the limitations of evaluation and management by telemedicine. The patient expressed understanding and agreed to proceed.  Vital Signs: Because this visit was a virtual/telehealth visit, some criteria may be missing or patient reported. Any vitals not documented were not able to be obtained and vitals that have been documented are patient reported.  VideoDeclined- This patient declined Librarian, academic. Therefore the visit was completed with audio only.  Persons Participating in Visit:  Laneta Pintos, husband and patient was present during visit.  AWV Questionnaire: No: Patient Medicare AWV questionnaire was not completed prior to this visit.  Cardiac Risk Factors include: advanced age (>62men, >33 women);hypertension;diabetes mellitus;obesity (BMI >30kg/m2);sedentary lifestyle;dyslipidemia (diet controlled DM)     Objective:    Today's Vitals   03/05/24 0833  Weight: (!) 370 lb (167.8 kg)  Height: 5\' 2"  (1.575 m)  PainSc: 7    Body mass index is 67.67 kg/m.     03/05/2024    8:50 AM 02/28/2023   10:34 AM 01/17/2023    7:07 AM 11/26/2022    7:08 AM 11/23/2022    9:42 AM 07/20/2022   10:32 AM 01/24/2022   11:02 AM  Advanced Directives  Does Patient Have a Medical Advance Directive? No No No No No No No  Would patient like information on creating a medical advance directive? No - Patient declined No - Patient declined  No - Patient declined  No - Patient declined No - Patient declined    Current Medications (verified) Outpatient Encounter Medications as of 03/05/2024  Medication Sig    Accu-Chek Softclix Lancets lancets Use to check blood sugar 3 times a day and document for visits. Goal is <130 fasting and <180 two hours after meal.,   acetaminophen  (TYLENOL ) 500 MG tablet Take 2 tablets (1,000 mg total) by mouth every 6 (six) hours as needed.   Ascorbic Acid (VITAMIN C) 1000 MG tablet Take 1,000 mg by mouth 2 (two) times a week.   azelastine (OPTIVAR) 0.05 % ophthalmic solution Apply to eye.   Blood Glucose Monitoring Suppl (ACCU-CHEK AVIVA PLUS) w/Device KIT Use to check blood sugar 3 times a day and document for visits.  Goal is <130 fasting and <180 two hours after meal.   chlorthalidone  (HYGROTON ) 25 MG tablet Take by mouth.   Cholecalciferol (VITAMIN D3) 1000 units CAPS Take 1,000 Units by mouth 3 (three) times a week.   clopidogrel  (PLAVIX ) 75 MG tablet Take 1 tablet (75 mg total) by mouth daily.   Cyanocobalamin (VITAMIN B 12) 500 MCG TABS Take 500 mcg by mouth daily.   fluticasone  furoate-vilanterol (BREO ELLIPTA ) 100-25 MCG/ACT AEPB Inhale 1 puff into the lungs daily.   gabapentin  (NEURONTIN ) 300 MG capsule Take 1 capsule (300 mg total) by mouth 2 (two) times daily.   glucose blood (ACCU-CHEK GUIDE TEST) test strip Use to check blood sugar 3 times a day and document for visits. Goal is <130 fasting and <180 two hours after meal.,   JARDIANCE  10 MG TABS tablet Take 1 tablet (10 mg total) by mouth daily.   losartan  (COZAAR ) 50 MG tablet Take 1 tablet by mouth daily. Patient is  taking 25 MG tablet.   potassium chloride  SA (KLOR-CON  M) 20 MEQ tablet Take 1 tablet (20 mEq total) by mouth 2 (two) times daily.   pyridOXINE (VITAMIN B-6) 25 MG tablet Take by mouth daily.    No facility-administered encounter medications on file as of 03/05/2024.    Allergies (verified) Elemental sulfur, Levaquin [levofloxacin], Tekturna [aliskiren], Accupril [quinapril hcl], Aspirin, Diovan [valsartan], Erythromycin, Iodine, Latex, Lotensin [benazepril], Norvasc [amlodipine], Shellfish  allergy, Sulfa antibiotics, Tetanus toxoids, Tetracycline, Tetracyclines & related, and Penicillins   History: Past Medical History:  Diagnosis Date   Arthritis    CAD (coronary artery disease)    CKD (chronic kidney disease)    CVA (cerebral infarction)    L sided   Dysrhythmia    Hyperlipidemia    Lymphadenopathy    Nausea and vomiting 09/15/2022   Pituitary adenoma (HCC)    Presence of permanent cardiac pacemaker    Recurrent depression (HCC)    Shortness of breath dyspnea    Sick sinus syndrome (HCC)    Stroke [facility])    Past Surgical History:  Procedure Laterality Date   ABDOMINAL HYSTERECTOMY     CHOLECYSTECTOMY N/A 11/27/2022   Procedure: LAPAROSCOPIC CHOLECYSTECTOMY WITH INTRAOPERATIVE CHOLANGIOGRAM;  Surgeon: Shela Derby, MD;  Location: [facility] OR;  Service: General;  Laterality: N/A;   COLONOSCOPY WITH PROPOFOL  N/A 01/05/2016   Procedure: COLONOSCOPY WITH PROPOFOL ;  Surgeon: Marnee Sink, MD;  Location: [facility] ENDOSCOPY;  Service: Endoscopy;  Laterality: N/A;   ESOPHAGOGASTRODUODENOSCOPY (EGD) WITH PROPOFOL  N/A 11/26/2022   Procedure: ESOPHAGOGASTRODUODENOSCOPY (EGD) WITH PROPOFOL ;  Surgeon: Alvis Jourdain, MD;  Location: [facility] ENDOSCOPY;  Service: Gastroenterology;  Laterality: N/A;   EUS N/A 11/26/2022   Procedure: UPPER ENDOSCOPIC ULTRASOUND (EUS) LINEAR;  Surgeon: Alvis Jourdain, MD;  Location: [facility] ENDOSCOPY;  Service: Gastroenterology;  Laterality: N/A;   PACEMAKER INSERTION  2015   Dr. Cleda Curly   PPM GENERATOR CHANGEOUT N/A 01/17/2023   Procedure: PPM GENERATOR CHANGEOUT;  Surgeon: Percival Brace, MD;  Location: [facility] INVASIVE CV LAB;  Service: Cardiovascular;  Laterality: N/A;   Family History  Problem Relation Age of Onset   Hypertension Mother    Breast cancer Mother    Lung cancer Mother    Brain cancer Mother    Hypertension Father    Diabetes Father        under control   Thyroid  disease Maternal Aunt    Hypertension Maternal Grandmother    Depression  Maternal Grandmother    Hypertension Maternal Grandfather    Hypertension Paternal Grandmother    Hypertension Paternal Grandfather    Social History   Socioeconomic History   Marital status: Married    Spouse name: Laneta Pintos   Number of children: 0   Years of education: Not on file   Highest education level: Not on file  Occupational History   Not on file  Tobacco Use   Smoking status: Never    Passive exposure: Past   Smokeless tobacco: Never  Vaping Use   Vaping status: Never Used  Substance and Sexual Activity   Alcohol use: No   Drug use: No   Sexual activity: Not on file  Other Topics Concern   Not on file  Social History Narrative   Not on file   Social Drivers of Health   Financial Resource Strain: Low Risk  (03/05/2024)   Overall Financial Resource Strain (CARDIA)    Difficulty of Paying Living Expenses: Not hard at all  Food Insecurity: No Food Insecurity (03/05/2024)   Hunger  Vital Sign    Worried About Programme researcher, broadcasting/film/video in the Last Year: Never true    Ran Out of Food in the Last Year: Never true  Transportation Needs: No Transportation Needs (03/05/2024)   PRAPARE - Administrator, Civil Service (Medical): No    Lack of Transportation (Non-Medical): No  Physical Activity: Inactive (03/05/2024)   Exercise Vital Sign    Days of Exercise per Week: 0 days    Minutes of Exercise per Session: 0 min  Stress: No Stress Concern Present (03/05/2024)   [employer] of Occupational Health - Occupational Stress Questionnaire    Feeling of Stress : Not at all  Social Connections: Moderately Integrated (03/05/2024)   Social Connection and Isolation Panel [NHANES]    Frequency of Communication with Friends and Family: More than three times a week    Frequency of Social Gatherings with Friends and Family: Twice a week    Attends Religious Services: More than 4 times per year    Active Member of [employer] or Organizations: No    Attends Hospital doctor: Never    Marital Status: Married    Tobacco Counseling Counseling given: Not Answered    Clinical Intake:  Pre-visit preparation completed: Yes  Pain : 0-10 Pain Score: 7  Pain Type: Chronic pain Pain Location: Knee Pain Orientation: Left, Right Pain Descriptors / Indicators: Aching     BMI - recorded: 67.67 Nutritional Status: BMI > 30  Obese Nutritional Risks: None Diabetes: Yes (diet controlled) CBG done?: No Did pt. bring in CBG monitor from home?: No  Lab Results  Component Value Date   HGBA1C 6.6 (H) 01/24/2024   HGBA1C 6.5 (H) 10/26/2023   HGBA1C 6.4 (H) 07/27/2023     How often do you need to have someone help you when you read instructions, pamphlets, or other written materials from your doctor or pharmacy?: 1 - Never  Interpreter Needed?: No  Information entered by :: Jaunita Messier, CMA   Activities of Daily Living     03/05/2024    8:37 AM  In your present state of health, do you have any difficulty performing the following activities:  Hearing? 0  Vision? 0  Difficulty concentrating or making decisions? 0  Walking or climbing stairs? 1  Comment uses cane and rolling walker  Dressing or bathing? 0  Doing errands, shopping? 1  Comment doesn't drive, husband takes to appointments  Preparing Food and eating ? Y  Comment husband helps prepare food  Using the Toilet? N  In the past six months, have you accidently leaked urine? Y  Comment wears pad when going outside the home  Do you have problems with loss of bowel control? N  Managing your Medications? N  Managing your Finances? N  Housekeeping or managing your Housekeeping? Y  Comment husband does housekeeping    Patient Care Team: Aileen Alexanders, NP as PCP - General Antonette Batters, MD as Consulting Physician (Cardiology[facility], [facility] (Optometry)  Indicate any recent Medical Services you may have received from other than [facility] providers in the past year (date  may be approximate).     Assessment:   This is a routine wellness examination for Jashae.  Hearing/Vision screen Hearing Screening - Comments:: Denies hearing loss Vision Screening - Comments:: Gets DM eye exams, Dr. Violet Grew [place]   Goals Addressed             This Visit's Progress  Patient Stated       Would like to get a van for transportation       Depression Screen     03/05/2024    8:47 AM 01/24/2024   10:19 AM 07/27/2023   10:29 AM 02/28/2023   10:33 AM 01/24/2023   11:01 AM 12/06/2022    3:46 PM 07/25/2022    9:45 AM  PHQ 2/9 Scores  PHQ - 2 Score 2 0 0 0 0 0 0  PHQ- 9 Score 3 0 0 0 0 0 0    Fall Risk     03/05/2024    8:51 AM 02/28/2023   10:35 AM 01/24/2023   11:00 AM 12/06/2022    3:43 PM 07/25/2022    9:44 AM  Fall Risk   Falls in the past year? 0 0 0 0 0  Number falls in past yr: 0 0 0 0 0  Injury with Fall? 0 0 0 0 0  Risk for fall due to : No Fall Risks No Fall Risks Impaired balance/gait;Impaired mobility Impaired balance/gait;Impaired mobility No Fall Risks  Follow up Falls prevention discussed;Falls evaluation completed Falls prevention discussed;Falls evaluation completed Falls evaluation completed Falls evaluation completed Falls evaluation completed    MEDICARE RISK AT HOME:  Medicare Risk at Home Any stairs in or around the home?: No If so, are there any without handrails?: No Home free of loose throw rugs in walkways, pet beds, electrical cords, etc?: Yes Adequate lighting in your home to reduce risk of falls?: Yes Life alert?: No Use of a cane, walker or w/c?: Yes (cane and rolling walker) Grab bars in the bathroom?: No Shower chair or bench in shower?: No Elevated toilet seat or a handicapped toilet?: Yes  TIMED UP AND GO:  Was the test performed?  No  Cognitive Function: 6CIT completed        03/05/2024    8:53 AM 02/28/2023   10:40 AM 11/16/2020    9:55 AM 11/01/2018    9:39 AM 10/26/2017   10:17 AM  6CIT Screen  What  Year? 4 points 0 points 0 points 0 points 0 points  What month? 0 points 0 points 0 points 0 points 0 points  What time? 0 points 0 points 0 points 0 points 0 points  Count back from 20 0 points 0 points 0 points 0 points 0 points  Months in reverse 0 points 0 points 0 points 0 points 0 points  Repeat phrase 4 points 0 points 6 points 0 points 2 points  Total Score 8 points 0 points 6 points 0 points 2 points    Immunizations Immunization History  Administered Date(s) Administered   PFIZER(Purple Top)SARS-COV-2 Vaccination 02/01/2020, 02/22/2020, 11/04/2020   Pneumococcal Conjugate-13 06/03/2014   Pneumococcal Polysaccharide-23 08/07/2007, 12/29/2020   Td 07/18/2006    Screening Tests Health Maintenance  Topic Date Due   Zoster Vaccines- Shingrix (1 of 2) Never done   MAMMOGRAM  05/10/2023   COVID-19 Vaccine (4 - 2024-25 season) 07/16/2023   FOOT EXAM  03/15/2024   OPHTHALMOLOGY EXAM  06/13/2024   INFLUENZA VACCINE  06/14/2024   HEMOGLOBIN A1C  07/26/2024   Diabetic kidney evaluation - eGFR measurement  01/23/2025   Diabetic kidney evaluation - Urine ACR  01/23/2025   Medicare Annual Wellness (AWV)  03/05/2025   Colonoscopy  01/04/2026   DEXA SCAN  05/10/2027   Pneumonia Vaccine 36+ Years old  Completed   Hepatitis C Screening  Completed   HPV VACCINES  Aged Out   Meningococcal B Vaccine  Aged Out   DTaP/Tdap/Td  Discontinued    Health Maintenance  Health Maintenance Due  Topic Date Due   Zoster Vaccines- Shingrix (1 of 2) Never done   MAMMOGRAM  05/10/2023   COVID-19 Vaccine (4 - 2024-25 season) 07/16/2023   Health Maintenance Items Addressed: See Nurse Notes  Additional Screening:  Vision Screening: Recommended annual ophthalmology exams for early detection of glaucoma and other disorders of the eye.  Dental Screening: Recommended annual dental exams for proper oral hygiene  Community Resource Referral / Chronic Care Management: CRR required this visit?  No    CCM required this visit?  No     Plan:     I have personally reviewed and noted the following in the patient's chart:   Medical and social history Use of alcohol, tobacco or illicit drugs  Current medications and supplements including opioid prescriptions. Patient is not currently taking opioid prescriptions. Functional ability and status Nutritional status Physical activity Advanced directives List of other physicians Hospitalizations, surgeries, and ER visits in previous 12 months Vitals Screenings to include cognitive, depression, and falls Referrals and appointments  In addition, I have reviewed and discussed with patient certain preventive protocols, quality metrics, and best practice recommendations. A written personalized care plan for preventive services as well as general preventive health recommendations were provided to patient.     Jaunita Messier, CMA   03/05/2024   After Visit Summary: (Mail) Due to this being a telephonic visit, the after visit summary with patients personalized plan was offered to patient via mail   Notes:  6 CIT Score - 8 Gave phone# to [facility] to schedule MMG (order placed 01/24/24) Declined flu, covid and shingles vaccines Will need DM foot exam at next OV

## 2024-03-06 DIAGNOSIS — G4733 Obstructive sleep apnea (adult) (pediatric): Secondary | ICD-10-CM | POA: Diagnosis not present

## 2024-03-06 DIAGNOSIS — N1832 Chronic kidney disease, stage 3b: Secondary | ICD-10-CM | POA: Diagnosis not present

## 2024-03-06 DIAGNOSIS — I509 Heart failure, unspecified: Secondary | ICD-10-CM | POA: Diagnosis not present

## 2024-03-06 DIAGNOSIS — I129 Hypertensive chronic kidney disease with stage 1 through stage 4 chronic kidney disease, or unspecified chronic kidney disease: Secondary | ICD-10-CM | POA: Diagnosis not present

## 2024-03-06 DIAGNOSIS — Z950 Presence of cardiac pacemaker: Secondary | ICD-10-CM | POA: Diagnosis not present

## 2024-03-06 DIAGNOSIS — I5032 Chronic diastolic (congestive) heart failure: Secondary | ICD-10-CM | POA: Diagnosis not present

## 2024-03-06 DIAGNOSIS — Z8673 Personal history of transient ischemic attack (TIA), and cerebral infarction without residual deficits: Secondary | ICD-10-CM | POA: Diagnosis not present

## 2024-03-06 DIAGNOSIS — I10 Essential (primary) hypertension: Secondary | ICD-10-CM | POA: Diagnosis not present

## 2024-03-06 DIAGNOSIS — I495 Sick sinus syndrome: Secondary | ICD-10-CM | POA: Diagnosis not present

## 2024-03-06 DIAGNOSIS — I429 Cardiomyopathy, unspecified: Secondary | ICD-10-CM | POA: Diagnosis not present

## 2024-03-06 DIAGNOSIS — R0602 Shortness of breath: Secondary | ICD-10-CM | POA: Diagnosis not present

## 2024-03-06 NOTE — Telephone Encounter (Signed)
 Order signed

## 2024-03-06 NOTE — Telephone Encounter (Signed)
 Order placed through [facility] and pending for provider signature.

## 2024-03-11 ENCOUNTER — Other Ambulatory Visit: Payer: Self-pay | Admitting: Nurse Practitioner

## 2024-03-11 DIAGNOSIS — I6322 Cerebral infarction due to unspecified occlusion or stenosis of basilar arteries: Secondary | ICD-10-CM

## 2024-03-11 DIAGNOSIS — I495 Sick sinus syndrome: Secondary | ICD-10-CM

## 2024-03-13 NOTE — Telephone Encounter (Signed)
 Requested Prescriptions  Pending Prescriptions Disp Refills   potassium chloride  SA (KLOR-CON  M) 20 MEQ tablet [Pharmacy Med Name: POTASSIUM CL ER 20 MEQ TABLET] 180 tablet 0    Sig: Take 1 tablet (20 mEq total) by mouth 2 (two) times daily.     Endocrinology:  Minerals - Potassium Supplementation Failed - 03/13/2024  8:45 AM      Failed - Cr in normal range and within 360 days    Creatinine  Date Value Ref Range Status  07/12/2014 1.37 (H) 0.60 - 1.30 mg/dL Final   Creatinine, Ser  Date Value Ref Range Status  01/24/2024 1.19 (H) 0.57 - 1.00 mg/dL Final         Passed - K in normal range and within 360 days    Potassium  Date Value Ref Range Status  01/24/2024 4.1 3.5 - 5.2 mmol/L Final  07/12/2014 3.4 (L) 3.5 - 5.1 mmol/L Final         Passed - Valid encounter within last 12 months    Recent Outpatient Visits           1 month ago Sick sinus syndrome [facility])   [place] [facility] Aileen Alexanders, NP               clopidogrel  (PLAVIX ) 75 MG tablet [Pharmacy Med Name: CLOPIDOGREL  75 MG TABLET] 90 tablet     Sig: Take 1 tablet (75 mg total) by mouth daily.     Hematology: Antiplatelets - clopidogrel  Failed - 03/13/2024  8:45 AM      Failed - HCT in normal range and within 180 days    Hematocrit  Date Value Ref Range Status  07/27/2023 43.2 34.0 - 46.6 % Final         Failed - HGB in normal range and within 180 days    Hemoglobin  Date Value Ref Range Status  07/27/2023 14.0 11.1 - 15.9 g/dL Final         Failed - PLT in normal range and within 180 days    Platelets  Date Value Ref Range Status  07/27/2023 353 150 - 450 x10E3/uL Final         Failed - Cr in normal range and within 360 days    Creatinine  Date Value Ref Range Status  07/12/2014 1.37 (H) 0.60 - 1.30 mg/dL Final   Creatinine, Ser  Date Value Ref Range Status  01/24/2024 1.19 (H) 0.57 - 1.00 mg/dL Final         Passed - Valid encounter within last 6 months    Recent  Outpatient Visits           1 month ago Sick sinus syndrome [facility])   [place] [facility] Aileen Alexanders, NP

## 2024-03-13 NOTE — Telephone Encounter (Signed)
 Requested medication (s) are due for refill today: yes  Requested medication (s) are on the active medication list: yes  Last refill:  12/15/23 #90  Future visit scheduled: yes  Notes to clinic:  overdue lab work   Requested Prescriptions  Pending Prescriptions Disp Refills   clopidogrel  (PLAVIX ) 75 MG tablet [Pharmacy Med Name: CLOPIDOGREL  75 MG TABLET] 90 tablet     Sig: Take 1 tablet (75 mg total) by mouth daily.     Hematology: Antiplatelets - clopidogrel  Failed - 03/13/2024  8:46 AM      Failed - HCT in normal range and within 180 days    Hematocrit  Date Value Ref Range Status  07/27/2023 43.2 34.0 - 46.6 % Final         Failed - HGB in normal range and within 180 days    Hemoglobin  Date Value Ref Range Status  07/27/2023 14.0 11.1 - 15.9 g/dL Final         Failed - PLT in normal range and within 180 days    Platelets  Date Value Ref Range Status  07/27/2023 353 150 - 450 x10E3/uL Final         Failed - Cr in normal range and within 360 days    Creatinine  Date Value Ref Range Status  07/12/2014 1.37 (H) 0.60 - 1.30 mg/dL Final   Creatinine, Ser  Date Value Ref Range Status  01/24/2024 1.19 (H) 0.57 - 1.00 mg/dL Final         Passed - Valid encounter within last 6 months    Recent Outpatient Visits           1 month ago Sick sinus syndrome (HCC)   [place] [facility] Aileen Alexanders, NP              Signed Prescriptions Disp Refills   potassium chloride  SA (KLOR-CON  M) 20 MEQ tablet 180 tablet 0    Sig: Take 1 tablet (20 mEq total) by mouth 2 (two) times daily.     Endocrinology:  Minerals - Potassium Supplementation Failed - 03/13/2024  8:46 AM      Failed - Cr in normal range and within 360 days    Creatinine  Date Value Ref Range Status  07/12/2014 1.37 (H) 0.60 - 1.30 mg/dL Final   Creatinine, Ser  Date Value Ref Range Status  01/24/2024 1.19 (H) 0.57 - 1.00 mg/dL Final         Passed - K in normal range and within  360 days    Potassium  Date Value Ref Range Status  01/24/2024 4.1 3.5 - 5.2 mmol/L Final  07/12/2014 3.4 (L) 3.5 - 5.1 mmol/L Final         Passed - Valid encounter within last 12 months    Recent Outpatient Visits           1 month ago Sick sinus syndrome [facility])   [place] [facility] Aileen Alexanders, NP

## 2024-03-19 DIAGNOSIS — I495 Sick sinus syndrome: Secondary | ICD-10-CM | POA: Diagnosis not present

## 2024-03-19 DIAGNOSIS — Z950 Presence of cardiac pacemaker: Secondary | ICD-10-CM | POA: Diagnosis not present

## 2024-03-26 ENCOUNTER — Telehealth: Payer: Self-pay

## 2024-03-26 NOTE — Telephone Encounter (Signed)
 Copied from CRM [id number]. Topic: General - Other >> Mar 25, 2024  4:42 PM Santiya F wrote: Reason for CRM: Patient is calling in because she would like someone to follow up on her Bariatric Portable Toilet that was ordered for her back in April. Patient says she hasn't heard anything else since she was told the order was placed. Please follow up with patient.

## 2024-04-02 NOTE — Telephone Encounter (Signed)
 Commode delivered to patient 03/29/24

## 2024-04-18 ENCOUNTER — Ambulatory Visit: Admitting: Nurse Practitioner

## 2024-04-18 ENCOUNTER — Encounter: Payer: Self-pay | Admitting: Nurse Practitioner

## 2024-04-18 VITALS — BP 136/80 | HR 83 | Ht 62.0 in | Wt 382.0 lb

## 2024-04-18 DIAGNOSIS — E119 Type 2 diabetes mellitus without complications: Secondary | ICD-10-CM

## 2024-04-18 DIAGNOSIS — Z95811 Presence of heart assist device: Secondary | ICD-10-CM | POA: Diagnosis not present

## 2024-04-18 DIAGNOSIS — Z1231 Encounter for screening mammogram for malignant neoplasm of breast: Secondary | ICD-10-CM | POA: Diagnosis not present

## 2024-04-18 DIAGNOSIS — N1832 Chronic kidney disease, stage 3b: Secondary | ICD-10-CM

## 2024-04-18 DIAGNOSIS — I129 Hypertensive chronic kidney disease with stage 1 through stage 4 chronic kidney disease, or unspecified chronic kidney disease: Secondary | ICD-10-CM | POA: Diagnosis not present

## 2024-04-18 DIAGNOSIS — E7800 Pure hypercholesterolemia, unspecified: Secondary | ICD-10-CM

## 2024-04-18 DIAGNOSIS — Z0000 Encounter for general adult medical examination without abnormal findings: Secondary | ICD-10-CM

## 2024-04-18 DIAGNOSIS — Z7984 Long term (current) use of oral hypoglycemic drugs: Secondary | ICD-10-CM | POA: Diagnosis not present

## 2024-04-18 DIAGNOSIS — I495 Sick sinus syndrome: Secondary | ICD-10-CM

## 2024-04-18 DIAGNOSIS — I509 Heart failure, unspecified: Secondary | ICD-10-CM | POA: Diagnosis not present

## 2024-04-18 LAB — MICROALBUMIN, URINE WAIVED
Creatinine, Urine Waived: 200 mg/dL (ref 10–300)
Microalb, Ur Waived: 30 mg/L — ABNORMAL HIGH (ref 0–19)
Microalb/Creat Ratio: <30 mg/g (ref <30)

## 2024-04-18 MED ORDER — GABAPENTIN 300 MG PO CAPS: 300.0000 mg | ORAL_CAPSULE | Freq: Two times a day (BID) | ORAL | 1 refills | Status: DC

## 2024-04-18 MED ORDER — JARDIANCE 10 MG PO TABS: 10.0000 mg | ORAL_TABLET | Freq: Every day | ORAL | 4 refills | Status: DC

## 2024-04-18 MED ORDER — POTASSIUM CHLORIDE CRYS ER 20 MEQ PO TBCR: 20.0000 meq | EXTENDED_RELEASE_TABLET | Freq: Two times a day (BID) | ORAL | 0 refills | Status: DC

## 2024-04-18 MED ORDER — FLUTICASONE FUROATE-VILANTEROL 100-25 MCG/ACT IN AEPB: 1.0000 | INHALATION_SPRAY | Freq: Every day | RESPIRATORY_TRACT | 2 refills | Status: DC

## 2024-04-18 MED ORDER — ACCU-CHEK GUIDE TEST VI STRP: ORAL_STRIP | 1 refills | Status: AC

## 2024-04-18 MED ORDER — ACCU-CHEK SOFTCLIX LANCETS MISC: 2 refills | Status: AC

## 2024-04-18 MED ORDER — CLOPIDOGREL BISULFATE 75 MG PO TABS: 75.0000 mg | ORAL_TABLET | Freq: Every day | ORAL | 1 refills | Status: DC

## 2024-04-18 NOTE — Assessment & Plan Note (Signed)
 Chronic, ongoing  She is taking Jardiance  10 mg, chlorthalidone  and Losartan  25mg  PO daily - Cardiology decreased losartan  from 50mg  to 25mg  and Chlorthalidone  from 25mg  to 12.5mg . Continue medications.  Follow up with cardiology.  Recheck CMP today for monitoring  Follow up in 6 months or sooner if concerns arise

## 2024-04-18 NOTE — Assessment & Plan Note (Signed)
Chronic.  No new neurologic issues. Continue plavix and lifestyle modifications.  Will continue to reassess at future visits.

## 2024-04-18 NOTE — Assessment & Plan Note (Signed)
 New pacemaker placed in March 2024.  No current concerns regarding implanted device.  Followed by Dr. Beau Bound.  Reviewed recent note.

## 2024-04-18 NOTE — Assessment & Plan Note (Signed)
 Chronic, controlled.  Not able to tolerate statins.  Encouraged diet improvement.  Labs ordered at visit today.  Follow up in 6 months.  Call sooner if concerns arise.

## 2024-04-18 NOTE — Assessment & Plan Note (Signed)
 Recommended eating smaller high protein, low fat meals more frequently and exercising 30 mins a day 5 times a week with a goal of 10-15lb weight loss in the next 3 months.

## 2024-04-18 NOTE — Progress Notes (Signed)
 BP 136/80   Pulse 83   Ht 5\' 2"  (1.575 m)   Wt (!) 382 lb (173.3 kg)   SpO2 93%   BMI 69.87 kg/m    Subjective:    Patient ID: Mary Webb, female    DOB: [birth year], 68 y.o.   MRN: [id number]  HPI: Mary Webb is a 68 y.o. female presenting on 04/18/2024 for comprehensive medical examination. Current medical complaints include:none  She currently lives with: Menopausal Symptoms: no  HYPERTENSION / HYPERLIPIDEMIA Had a new Pacemaker in March 2024.  Followed by Dr. Beau Bound.   Satisfied with current treatment? no Duration of hypertension: years BP monitoring frequency: occasional BP range: 120/80 BP medication side effects: no Past BP meds: chlorthalidone  and losartan  (cozaar ) Duration of hyperlipidemia: years Cholesterol medication side effects: no Cholesterol supplements: none Past cholesterol medications: none Medication compliance: excellent compliance Aspirin: no Recent stressors: no Recurrent headaches: no Visual changes: no Palpitations: no Dyspnea: better Chest pain: no Lower extremity edema: no Dizzy/lightheaded: no  DIABETES Taking Jardiance .  Hypoglycemic episodes:no Polydipsia/polyuria: no Visual disturbance: no Chest pain: no Paresthesias: no Glucose Monitoring: no  Accucheck frequency: daily  Fasting glucose: <110  Post prandial:  Evening:  Before meals: Taking Insulin ?: no  Long acting insulin :  Short acting insulin : Blood Pressure Monitoring: weekly Retinal Examination: Up to Date Foot Exam: Up to Date Diabetic Education: Not Completed Pneumovax: Up to Date Influenza: Not up to Date Aspirin: yes   Depression Screen done today and results listed below:     04/18/2024    8:55 AM 03/05/2024    8:47 AM 01/24/2024   10:19 AM 07/27/2023   10:29 AM 02/28/2023   10:33 AM  Depression screen PHQ 2/9  Decreased Interest 0 1 0 0 0  Down, Depressed, Hopeless 0 1 0 0 0  PHQ - 2 Score 0 2 0 0 0  Altered sleeping 0 0 0 0 0  Tired,  decreased energy 0 0 0 0 0  Change in appetite 0 0 0 0 0  Feeling bad or failure about yourself  0 1 0 0 0  Trouble concentrating 0 0 0 0 0  Moving slowly or fidgety/restless 0 0 0 0 0  Suicidal thoughts 0 0 0 0 0  PHQ-9 Score 0 3 0 0 0  Difficult doing work/chores  Not difficult at all  Not difficult at all Not difficult at all    The patient does not have a history of falls. I did complete a risk assessment for falls. A plan of care for falls was documented.   Past Medical History:  Past Medical History:  Diagnosis Date   Arthritis    CAD (coronary artery disease)    CKD (chronic kidney disease)    CVA (cerebral infarction)    L sided   Dysrhythmia    Hyperlipidemia    Lymphadenopathy    Nausea and vomiting 09/15/2022   Pituitary adenoma (HCC)    Presence of permanent cardiac pacemaker    Recurrent depression (HCC)    Shortness of breath dyspnea    Sick sinus syndrome (HCC)    Stroke [facility])     Surgical History:  Past Surgical History:  Procedure Laterality Date   ABDOMINAL HYSTERECTOMY     CHOLECYSTECTOMY N/A 11/27/2022   Procedure: LAPAROSCOPIC CHOLECYSTECTOMY WITH INTRAOPERATIVE CHOLANGIOGRAM;  Surgeon: Shela Derby, MD;  Location: [facility] OR;  Service: General;  Laterality: N/A;   COLONOSCOPY WITH PROPOFOL  N/A 01/05/2016   Procedure: COLONOSCOPY WITH PROPOFOL ;  Surgeon: Marnee Sink, MD;  Location: [facility] ENDOSCOPY;  Service: Endoscopy;  Laterality: N/A;   ESOPHAGOGASTRODUODENOSCOPY (EGD) WITH PROPOFOL  N/A 11/26/2022   Procedure: ESOPHAGOGASTRODUODENOSCOPY (EGD) WITH PROPOFOL ;  Surgeon: Alvis Jourdain, MD;  Location: [facility] ENDOSCOPY;  Service: Gastroenterology;  Laterality: N/A;   EUS N/A 11/26/2022   Procedure: UPPER ENDOSCOPIC ULTRASOUND (EUS) LINEAR;  Surgeon: Alvis Jourdain, MD;  Location: [facility] ENDOSCOPY;  Service: Gastroenterology;  Laterality: N/A;   PACEMAKER INSERTION  2015   Dr. Cleda Curly   PPM GENERATOR CHANGEOUT N/A 01/17/2023   Procedure: PPM GENERATOR CHANGEOUT;   Surgeon: Percival Brace, MD;  Location: [facility] INVASIVE CV LAB;  Service: Cardiovascular;  Laterality: N/A;    Medications:  Current Outpatient Medications on File Prior to Visit  Medication Sig   acetaminophen  (TYLENOL ) 500 MG tablet Take 2 tablets (1,000 mg total) by mouth every 6 (six) hours as needed.   Ascorbic Acid (VITAMIN C) 1000 MG tablet Take 1,000 mg by mouth 2 (two) times a week.   azelastine (OPTIVAR) 0.05 % ophthalmic solution Apply to eye.   Blood Glucose Monitoring Suppl (ACCU-CHEK AVIVA PLUS) w/Device KIT Use to check blood sugar 3 times a day and document for visits.  Goal is <130 fasting and <180 two hours after meal.   chlorthalidone  (HYGROTON ) 25 MG tablet Take 12.5 mg by mouth daily.   Cholecalciferol (VITAMIN D3) 1000 units CAPS Take 1,000 Units by mouth 3 (three) times a week.   Cyanocobalamin (VITAMIN B 12) 500 MCG TABS Take 500 mcg by mouth daily.   losartan  (COZAAR ) 50 MG tablet Take 1 tablet by mouth daily. Patient is taking 25 MG tablet.   OXYGEN  Inhale into the lungs. 3L at night   pyridOXINE (VITAMIN B-6) 25 MG tablet Take by mouth daily.    No current facility-administered medications on file prior to visit.    Allergies:  Allergies  Allergen Reactions   Elemental Sulfur Anaphylaxis   Levaquin [Levofloxacin] Other (See Comments)    Stroke   Tekturna [Aliskiren] Shortness Of Breath   Accupril [Quinapril Hcl]    Aspirin    Diovan [Valsartan]    Erythromycin    Iodine Hives   Latex     Can't remember reaction    Lotensin [Benazepril]    Norvasc [Amlodipine]    Shellfish Allergy Swelling   Sulfa Antibiotics    Tetanus Toxoids    Tetracycline     Other reaction(s): Unknown   Tetracyclines & Related    Penicillins Rash    Social History:  Social History   Socioeconomic History   Marital status: Married    Spouse name: Laneta Pintos   Number of children: 0   Years of education: Not on file   Highest education level: Not on file   Occupational History   Not on file  Tobacco Use   Smoking status: Never    Passive exposure: Past   Smokeless tobacco: Never  Vaping Use   Vaping status: Never Used  Substance and Sexual Activity   Alcohol use: No   Drug use: No   Sexual activity: Not on file  Other Topics Concern   Not on file  Social History Narrative   Not on file   Social Drivers of Health   Financial Resource Strain: Low Risk  (03/05/2024)   Overall Financial Resource Strain (CARDIA)    Difficulty of Paying Living Expenses: Not hard at all  Food Insecurity: No Food Insecurity (03/05/2024)   Hunger Vital Sign    Worried About Running Out  of Food in the Last Year: Never true    Ran Out of Food in the Last Year: Never true  Transportation Needs: No Transportation Needs (03/05/2024)   PRAPARE - Administrator, Civil Service (Medical): No    Lack of Transportation (Non-Medical): No  Physical Activity: Inactive (03/05/2024)   Exercise Vital Sign    Days of Exercise per Week: 0 days    Minutes of Exercise per Session: 0 min  Stress: No Stress Concern Present (03/05/2024)   [employer] of Occupational Health - Occupational Stress Questionnaire    Feeling of Stress : Not at all  Social Connections: Moderately Integrated (03/05/2024)   Social Connection and Isolation Panel [NHANES]    Frequency of Communication with Friends and Family: More than three times a week    Frequency of Social Gatherings with Friends and Family: Twice a week    Attends Religious Services: More than 4 times per year    Active Member of [employer] or Organizations: No    Attends Banker Meetings: Never    Marital Status: Married  Catering manager Violence: Not At Risk (03/05/2024)   Humiliation, Afraid, Rape, and Kick questionnaire    Fear of Current or Ex-Partner: No    Emotionally Abused: No    Physically Abused: No    Sexually Abused: No   Social History   Tobacco Use  Smoking Status Never   Passive  exposure: Past  Smokeless Tobacco Never   Social History   Substance and Sexual Activity  Alcohol Use No    Family History:  Family History  Problem Relation Age of Onset   Hypertension Mother    Breast cancer Mother    Lung cancer Mother    Brain cancer Mother    Hypertension Father    Diabetes Father        under control   Thyroid  disease Maternal Aunt    Hypertension Maternal Grandmother    Depression Maternal Grandmother    Hypertension Maternal Grandfather    Hypertension Paternal Grandmother    Hypertension Paternal Grandfather     Past medical history, surgical history, medications, allergies, family history and social history reviewed with patient today and changes made to appropriate areas of the chart.   Review of Systems  HENT:         Denies vision changes.  Eyes:  Negative for blurred vision and double vision.  Respiratory:  Negative for shortness of breath.   Cardiovascular:  Negative for chest pain, palpitations and leg swelling.  Neurological:  Negative for dizziness, tingling and headaches.  Endo/Heme/Allergies:  Negative for polydipsia.       Denies Polyuria   All other ROS negative except what is listed above and in the HPI.      Objective:     BP 136/80   Pulse 83   Ht 5\' 2"  (1.575 m)   Wt (!) 382 lb (173.3 kg)   SpO2 93%   BMI 69.87 kg/m   Wt Readings from Last 3 Encounters:  04/18/24 (!) 382 lb (173.3 kg)  03/05/24 (!) 370 lb (167.8 kg)  01/24/24 (!) 383 lb 6.4 oz (173.9 kg)    Physical Exam Vitals and nursing note reviewed.  Constitutional:      General: She is not in acute distress.    Appearance: Normal appearance. She is obese. She is not ill-appearing, toxic-appearing or diaphoretic.  HENT:     Head: Normocephalic.     Right Ear: External ear  normal.     Left Ear: External ear normal.     Nose: Nose normal.     Mouth/Throat:     Mouth: Mucous membranes are moist.     Pharynx: Oropharynx is clear.  Eyes:     General:         Right eye: No discharge.        Left eye: No discharge.     Extraocular Movements: Extraocular movements intact.     Conjunctiva/sclera: Conjunctivae normal.     Pupils: Pupils are equal, round, and reactive to light.  Cardiovascular:     Rate and Rhythm: Normal rate and regular rhythm.     Heart sounds: No murmur heard. Pulmonary:     Effort: Pulmonary effort is normal. No respiratory distress.     Breath sounds: Normal breath sounds. No wheezing or rales.  Musculoskeletal:     Cervical back: Normal range of motion and neck supple.  Skin:    General: Skin is warm and dry.     Capillary Refill: Capillary refill takes less than 2 seconds.  Neurological:     General: No focal deficit present.     Mental Status: She is alert and oriented to person, place, and time. Mental status is at baseline.  Psychiatric:        Mood and Affect: Mood normal.        Behavior: Behavior normal.        Thought Content: Thought content normal.        Judgment: Judgment normal.     Results for orders placed or performed in visit on 01/24/24  Microalbumin, Urine Waived   Collection Time: 01/24/24 10:42 AM  Result Value Ref Range   Microalb, Ur Waived 30 (H) 0 - 19 mg/L   Creatinine, Urine Waived 200 10 - 300 mg/dL   Microalb/Creat Ratio <30 <30 mg/g  Hemoglobin A1c   Collection Time: 01/24/24 10:43 AM  Result Value Ref Range   Hgb A1c MFr Bld 6.6 (H) 4.8 - 5.6 %   Est. average glucose Bld gHb Est-mCnc 143 mg/dL  Comprehensive metabolic panel   Collection Time: 01/24/24 10:43 AM  Result Value Ref Range   Glucose 113 (H) 70 - 99 mg/dL   BUN 12 8 - 27 mg/dL   Creatinine, Ser 1.61 (H) 0.57 - 1.00 mg/dL   eGFR 50 (L) >09 UE/AVW/0.98   BUN/Creatinine Ratio 10 (L) 12 - 28   Sodium 140 134 - 144 mmol/L   Potassium 4.1 3.5 - 5.2 mmol/L   Chloride 101 96 - 106 mmol/L   CO2 23 20 - 29 mmol/L   Calcium  9.5 8.7 - 10.3 mg/dL   Total Protein 7.8 6.0 - 8.5 g/dL   Albumin 4.1 3.9 - 4.9 g/dL    Globulin, Total 3.7 1.5 - 4.5 g/dL   Bilirubin Total 0.4 0.0 - 1.2 mg/dL   Alkaline Phosphatase 98 44 - 121 IU/L   AST 25 0 - 40 IU/L   ALT 21 0 - 32 IU/L  Lipid panel   Collection Time: 01/24/24 10:43 AM  Result Value Ref Range   Cholesterol, Total 172 100 - 199 mg/dL   Triglycerides 92 0 - 149 mg/dL   HDL 58 >11 mg/dL   VLDL Cholesterol Cal 17 5 - 40 mg/dL   LDL Chol Calc (NIH) 97 0 - 99 mg/dL   Chol/HDL Ratio 3.0 0.0 - 4.4 ratio      Assessment & Plan:   Problem List Items  Addressed This Visit       Cardiovascular and Mediastinum   Sick sinus syndrome [facility]) (Chronic)   New pacemaker placed in March 2024.  No current concerns regarding implanted device.  Followed by Dr. Beau Bound.  Reviewed recent note.       Relevant Medications   clopidogrel  (PLAVIX ) 75 MG tablet   Congestive heart failure, unspecified HF chronicity, unspecified heart failure type (HCC)   Chronic.  Appears to be controlled at this time.  Euvolemic in office today.  - Reminded to call for an overnight weight gain of >2 pounds or a weekly weight gain of >5 pounds - not adding salt to food and read food labels. Reviewed the importance of keeping daily sodium intake to 2000mg  daily. - Avoid Ibuprofen products.        Endocrine   Diabetes mellitus treated with oral medication (HCC)   Chronic.  Controlled.  Continue with current medication regimen of Jardiance .  Labs ordered today.  Return to clinic in 6 months for reevaluation.  Call sooner if concerns arise.        Relevant Medications   JARDIANCE  10 MG TABS tablet   Other Relevant Orders   Microalbumin, Urine Waived     Genitourinary   Hypertensive kidney disease with CKD stage III (HCC)   Chronic, ongoing  She is taking Jardiance  10 mg, chlorthalidone  and Losartan  25mg  PO daily - Cardiology decreased losartan  from 50mg  to 25mg  and Chlorthalidone  from 25mg  to 12.5mg . Continue medications.  Follow up with cardiology.  Recheck CMP today for  monitoring  Follow up in 6 months or sooner if concerns arise         Other   Hyperlipidemia (Chronic)   Chronic, controlled.  Not able to tolerate statins.  Encouraged diet improvement.  Labs ordered at visit today.  Follow up in 6 months.  Call sooner if concerns arise.       Relevant Orders   Lipid panel   Morbid obesity (HCC)   Recommended eating smaller high protein, low fat meals more frequently and exercising 30 mins a day 5 times a week with a goal of 10-15lb weight loss in the next 3 months.       Relevant Medications   JARDIANCE  10 MG TABS tablet   Presence of heart assist device [facility])   New pacemaker placed in March 2024.  No current concerns regarding implanted device.  Followed by Dr. Beau Bound.  Reviewed recent note.       Other Visit Diagnoses       Annual physical exam    -  Primary   Health maintenance reviewed during visit today.  Labs ordered.  Vaccines reviewed. Mammogram ordered.  Colon cancer screening up to date.   Relevant Orders   T4   CBC with Differential/Platelet   Comprehensive metabolic panel with GFR   Lipid panel   TSH   Hemoglobin A1c     Encounter for screening mammogram for malignant neoplasm of breast       Relevant Orders   MM 3D SCREENING MAMMOGRAM BILATERAL BREAST        Follow up plan: Return in about 6 months (around 10/18/2024) for HTN, HLD, DM2 FU.   LABORATORY TESTING:  - Pap smear: not applicable  IMMUNIZATIONS:   - Tdap: Tetanus vaccination status reviewed: Medicare. - Influenza: Postponed to flu season - Pneumovax: Up to date - Prevnar: Up to date - COVID: Not applicable - HPV: Not applicable - Shingrix vaccine: Discussed at  visit today  SCREENING: -Mammogram: Ordered today  - Colonoscopy: Up to date  - Bone Density: Up to date  -Hearing Test: Not applicable  -Spirometry: Not applicable   PATIENT COUNSELING:   Advised to take 1 mg of folate supplement per day if capable of pregnancy.   Sexuality: Discussed  sexually transmitted diseases, partner selection, use of condoms, avoidance of unintended pregnancy  and contraceptive alternatives.   Advised to avoid cigarette smoking.  I discussed with the patient that most people either abstain from alcohol or drink within safe limits (<=14/week and <=4 drinks/occasion for males, <=7/weeks and <= 3 drinks/occasion for females) and that the risk for alcohol disorders and other health effects rises proportionally with the number of drinks per week and how often a drinker exceeds daily limits.  Discussed cessation/primary prevention of drug use and availability of treatment for abuse.   Diet: Encouraged to adjust caloric intake to maintain  or achieve ideal body weight, to reduce intake of dietary saturated fat and total fat, to limit sodium intake by avoiding high sodium foods and not adding table salt, and to maintain adequate dietary potassium and calcium  preferably from fresh fruits, vegetables, and low-fat dairy products.    stressed the importance of regular exercise  Injury prevention: Discussed safety belts, safety helmets, smoke detector, smoking near bedding or upholstery.   Dental health: Discussed importance of regular tooth brushing, flossing, and dental visits.    NEXT PREVENTATIVE PHYSICAL DUE IN 1 YEAR. Return in about 6 months (around 10/18/2024) for HTN, HLD, DM2 FU.

## 2024-04-18 NOTE — Assessment & Plan Note (Signed)
 Chronic.  Appears to be controlled at this time.  Euvolemic in office today.  - Reminded to call for an overnight weight gain of >2 pounds or a weekly weight gain of >5 pounds - not adding salt to food and read food labels. Reviewed the importance of keeping daily sodium intake to 2000mg  daily. - Avoid Ibuprofen products.

## 2024-04-18 NOTE — Assessment & Plan Note (Signed)
 Chronic.  Controlled.  Continue with current medication regimen of Jardiance .  Labs ordered today.  Return to clinic in 6 months for reevaluation.  Call sooner if concerns arise.

## 2024-04-19 LAB — LIPID PANEL

## 2024-04-20 LAB — CBC WITH DIFFERENTIAL/PLATELET
Basophils Absolute: 0.1 10*3/uL (ref 0.0–0.2)
Basos: 1 %
EOS (ABSOLUTE): 0.1 10*3/uL (ref 0.0–0.4)
Eos: 3 %
Hematocrit: 44.8 % (ref 34.0–46.6)
Hemoglobin: 14.1 g/dL (ref 11.1–15.9)
Immature Grans (Abs): 0 10*3/uL (ref 0.0–0.1)
Immature Granulocytes: 0 %
Lymphocytes Absolute: 1.5 10*3/uL (ref 0.7–3.1)
Lymphs: 28 %
MCH: 29.4 pg (ref 26.6–33.0)
MCHC: 31.5 g/dL (ref 31.5–35.7)
MCV: 93 fL (ref 79–97)
Monocytes Absolute: 0.6 10*3/uL (ref 0.1–0.9)
Monocytes: 11 %
Neutrophils Absolute: 3.1 10*3/uL (ref 1.4–7.0)
Neutrophils: 57 %
Platelets: 328 10*3/uL (ref 150–450)
RBC: 4.8 x10E6/uL (ref 3.77–5.28)
RDW: 13.5 % (ref 11.7–15.4)
WBC: 5.3 10*3/uL (ref 3.4–10.8)

## 2024-04-20 LAB — COMPREHENSIVE METABOLIC PANEL WITH GFR
ALT: 17 IU/L (ref 0–32)
AST: 17 IU/L (ref 0–40)
Albumin: 3.9 g/dL (ref 3.9–4.9)
Alkaline Phosphatase: 83 IU/L (ref 44–121)
BUN/Creatinine Ratio: 10 — ABNORMAL LOW (ref 12–28)
BUN: 11 mg/dL (ref 8–27)
Bilirubin Total: 0.4 mg/dL (ref 0.0–1.2)
CO2: 24 mmol/L (ref 20–29)
Calcium: 9.3 mg/dL (ref 8.7–10.3)
Chloride: 99 mmol/L (ref 96–106)
Creatinine, Ser: 1.1 mg/dL — ABNORMAL HIGH (ref 0.57–1.00)
Globulin, Total: 3.4 g/dL (ref 1.5–4.5)
Glucose: 102 mg/dL — ABNORMAL HIGH (ref 70–99)
Potassium: 4.1 mmol/L (ref 3.5–5.2)
Sodium: 137 mmol/L (ref 134–144)
Total Protein: 7.3 g/dL (ref 6.0–8.5)
eGFR: 55 mL/min/{1.73_m2} — ABNORMAL LOW (ref >59)

## 2024-04-20 LAB — LIPID PANEL
Chol/HDL Ratio: 3.2 ratio (ref 0.0–4.4)
Cholesterol, Total: 167 mg/dL (ref 100–199)
HDL: 52 mg/dL (ref >39)
LDL Chol Calc (NIH): 98 mg/dL (ref 0–99)
Triglycerides: 94 mg/dL (ref 0–149)
VLDL Cholesterol Cal: 17 mg/dL (ref 5–40)

## 2024-04-20 LAB — HEMOGLOBIN A1C
Est. average glucose Bld gHb Est-mCnc: 126 mg/dL
Hgb A1c MFr Bld: 6 % — ABNORMAL HIGH (ref 4.8–5.6)

## 2024-04-20 LAB — T4: T4, Total: 7.9 ug/dL (ref 4.5–12.0)

## 2024-04-20 LAB — TSH: TSH: 2.4 u[IU]/mL (ref 0.450–4.500)

## 2024-04-22 ENCOUNTER — Ambulatory Visit: Payer: Self-pay | Admitting: Nurse Practitioner

## 2024-04-25 ENCOUNTER — Telehealth: Payer: Self-pay

## 2024-04-25 NOTE — Telephone Encounter (Signed)
 Called Mary Webb and scheduled the patient's mammogram appointment for 05/09/24 at 10:40 AM.   Called and notified patient of appointment date and time.

## 2024-04-25 NOTE — Telephone Encounter (Signed)
-----   Message from Mary Webb sent at 04/18/2024  9:12 AM EDT ----- Can we make her mammogram appt.  10-11 am any day of the week

## 2024-05-09 ENCOUNTER — Ambulatory Visit
Admission: RE | Admit: 2024-05-09 | Discharge: 2024-05-09 | Disposition: A | Discharge status: Home / Self Care | Source: Ambulatory Visit | Attending: Nurse Practitioner | Admitting: Nurse Practitioner

## 2024-05-09 DIAGNOSIS — Z1231 Encounter for screening mammogram for malignant neoplasm of breast: Secondary | ICD-10-CM | POA: Insufficient documentation

## 2024-06-09 ENCOUNTER — Other Ambulatory Visit: Payer: Self-pay | Admitting: Nurse Practitioner

## 2024-06-11 NOTE — Telephone Encounter (Signed)
 Requested Prescriptions  Refused Prescriptions Disp Refills   potassium chloride  SA (KLOR-CON  M) 20 MEQ tablet [Pharmacy Med Name: POTASSIUM CL ER 20 MEQ TABLET] 180 tablet 0    Sig: Take 1 tablet (20 mEq total) by mouth 2 (two) times daily.     Endocrinology:  Minerals - Potassium Supplementation Failed - 06/11/2024  7:40 AM      Failed - Cr in normal range and within 360 days    Creatinine  Date Value Ref Range Status  07/12/2014 1.37 (H) 0.60 - 1.30 mg/dL Final   Creatinine, Ser  Date Value Ref Range Status  04/18/2024 1.10 (H) 0.57 - 1.00 mg/dL Final         Passed - K in normal range and within 360 days    Potassium  Date Value Ref Range Status  04/18/2024 4.1 3.5 - 5.2 mmol/L Final  07/12/2014 3.4 (L) 3.5 - 5.1 mmol/L Final         Passed - Valid encounter within last 12 months    Recent Outpatient Visits           1 month ago Annual physical exam   [place] [facility] Melvin Pao, NP   4 months ago Sick sinus syndrome [facility])   [place] [facility] Melvin Pao, NP

## 2024-07-31 ENCOUNTER — Encounter: Admitting: Nurse Practitioner

## 2024-08-08 ENCOUNTER — Ambulatory Visit: Payer: Self-pay

## 2024-08-08 NOTE — Telephone Encounter (Signed)
 Patient will have to have an appointment before an antibiotic can be prescribed. We have to at least get a urine same from the patient and do a visit afterwards.

## 2024-08-08 NOTE — Telephone Encounter (Signed)
 FYI Only or Action Required?: Action required by provider: wants antibiotic called in for uti.  States urgency is very bad and she is experiencing episodes of incontinence..  Patient was last seen in primary care on 04/18/2024 by Melvin Pao, NP.  Called Nurse Triage reporting Flank Pain.  Symptoms began yesterday.  Interventions attempted: Nothing.  Symptoms are: gradually worsening.  Triage Disposition: See Physician Within 24 Hours  Patient/caregiver understands and will follow disposition?: No, wishes to speak with PCP   Summary: Possible bladder infection   Pt called reporting that she believes she has a bladder infection, says she is urinating so much that she does not believe she can make it to the office. Wants to speak to a nurse, declined scheduling appt. Lower back pain  Please advise  Best contact: [phone]         Reason for Disposition  MODERATE pain (e.g., interferes with normal activities or awakens from sleep)  Answer Assessment - Initial Assessment Questions 1. LOCATION: Where does it hurt? (e.g., left, right)     Right flank pain 2. ONSET: When did the pain start?     yesterday 3. SEVERITY: How bad is the pain? (e.g., Scale 1-10; mild, moderate, or severe)     Severe does ease off after she pees 4. PATTERN: Does the pain come and go, or is it constant?      Comes and goes 5. CAUSE: What do you think is causing the pain?     uti 6. OTHER SYMPTOMS:  Do you have any other symptoms? (e.g., fever, abdomen pain, vomiting, leg weakness, burning with urination, blood in urine)     Frequency, incontinence, 7. PREGNANCY:  Is there any chance you are pregnant? When was your last menstrual period?     na  Protocols used: Flank Pain-A-AH

## 2024-08-08 NOTE — Telephone Encounter (Signed)
 Created in error, patient already spoke to NT   Copied from CRM #[id number]. Topic: Clinical - Pink Word Triage >> Aug 08, 2024  8:31 AM Mary Webb wrote: Reason for Triage: Pt called reporting that she believes she has a bladder infection, says she is urinating so much that she does not believe she can make it to the office. Wants to speak to a nurse, declined scheduling appt. Lower back pain  Please advise  Best contact: [phone] >> Aug 08, 2024  8:32 AM Mary Webb wrote: Pt called reporting that she believes she has a bladder infection, says she is urinating so much that she does not believe she can make it to the office. Wants to speak to a nurse, declined scheduling appt. Lower back pain  Please advise  Best contact: [phone] This encounter was created in error - please disregard.

## 2024-08-13 NOTE — Telephone Encounter (Signed)
 Called patient to schedule an appt, she said she will call back to schedule the appt.

## 2024-08-20 DIAGNOSIS — I495 Sick sinus syndrome: Secondary | ICD-10-CM | POA: Diagnosis not present

## 2024-08-21 DIAGNOSIS — N390 Urinary tract infection, site not specified: Secondary | ICD-10-CM | POA: Diagnosis not present

## 2024-08-21 DIAGNOSIS — Z8673 Personal history of transient ischemic attack (TIA), and cerebral infarction without residual deficits: Secondary | ICD-10-CM | POA: Diagnosis not present

## 2024-08-21 DIAGNOSIS — G4733 Obstructive sleep apnea (adult) (pediatric): Secondary | ICD-10-CM | POA: Diagnosis not present

## 2024-08-21 DIAGNOSIS — I495 Sick sinus syndrome: Secondary | ICD-10-CM | POA: Diagnosis not present

## 2024-08-21 DIAGNOSIS — I5032 Chronic diastolic (congestive) heart failure: Secondary | ICD-10-CM | POA: Diagnosis not present

## 2024-08-21 DIAGNOSIS — I129 Hypertensive chronic kidney disease with stage 1 through stage 4 chronic kidney disease, or unspecified chronic kidney disease: Secondary | ICD-10-CM | POA: Diagnosis not present

## 2024-08-21 DIAGNOSIS — R0602 Shortness of breath: Secondary | ICD-10-CM | POA: Diagnosis not present

## 2024-08-21 DIAGNOSIS — I10 Essential (primary) hypertension: Secondary | ICD-10-CM | POA: Diagnosis not present

## 2024-08-21 DIAGNOSIS — I429 Cardiomyopathy, unspecified: Secondary | ICD-10-CM | POA: Diagnosis not present

## 2024-08-21 DIAGNOSIS — Z950 Presence of cardiac pacemaker: Secondary | ICD-10-CM | POA: Diagnosis not present

## 2024-08-21 DIAGNOSIS — R200 Anesthesia of skin: Secondary | ICD-10-CM | POA: Diagnosis not present

## 2024-08-22 ENCOUNTER — Telehealth: Payer: Self-pay

## 2024-08-22 ENCOUNTER — Other Ambulatory Visit: Payer: Self-pay | Admitting: Internal Medicine

## 2024-08-22 DIAGNOSIS — I429 Cardiomyopathy, unspecified: Secondary | ICD-10-CM

## 2024-08-22 NOTE — Telephone Encounter (Signed)
 Called and clarified with patient what type of chair she was requesting. Patient would like a bariatric manual wheelchair. Ok to order this for the patient?

## 2024-08-22 NOTE — Telephone Encounter (Signed)
 Copied from CRM #[id number]. Topic: General - Other >> Aug 21, 2024  2:21 PM Joesph B wrote: Reason for CRM: patient wants a Bariatric wheelchair. She is wanting to know how she can get one?

## 2024-08-23 NOTE — Telephone Encounter (Signed)
Okay to order wheelchair? 

## 2024-08-26 NOTE — Telephone Encounter (Signed)
 Order placed through [facility] for the patient.

## 2024-09-03 ENCOUNTER — Other Ambulatory Visit: Payer: Self-pay | Admitting: Nurse Practitioner

## 2024-09-04 DIAGNOSIS — R300 Dysuria: Secondary | ICD-10-CM | POA: Diagnosis not present

## 2024-09-04 DIAGNOSIS — N390 Urinary tract infection, site not specified: Secondary | ICD-10-CM | POA: Diagnosis not present

## 2024-09-05 NOTE — Telephone Encounter (Signed)
 Requested Prescriptions  Pending Prescriptions Disp Refills   potassium chloride  SA (KLOR-CON  M) 20 MEQ tablet [Pharmacy Med Name: POTASSIUM CL ER 20 MEQ TABLET] 180 tablet 0    Sig: Take 1 tablet (20 mEq total) by mouth 2 (two) times daily.     Endocrinology:  Minerals - Potassium Supplementation Failed - 09/05/2024  9:21 AM      Failed - Cr in normal range and within 360 days    Creatinine  Date Value Ref Range Status  07/12/2014 1.37 (H) 0.60 - 1.30 mg/dL Final   Creatinine, Ser  Date Value Ref Range Status  04/18/2024 1.10 (H) 0.57 - 1.00 mg/dL Final         Passed - K in normal range and within 360 days    Potassium  Date Value Ref Range Status  04/18/2024 4.1 3.5 - 5.2 mmol/L Final  07/12/2014 3.4 (L) 3.5 - 5.1 mmol/L Final         Passed - Valid encounter within last 12 months    Recent Outpatient Visits           4 months ago Annual physical exam   [place] [facility] Melvin Pao, NP   7 months ago Sick sinus syndrome [facility])   [place] [facility] Melvin Pao, NP

## 2024-09-19 ENCOUNTER — Ambulatory Visit: Admitting: Family Medicine

## 2024-09-20 ENCOUNTER — Telehealth: Payer: Self-pay | Admitting: Nurse Practitioner

## 2024-09-20 NOTE — Telephone Encounter (Signed)
 Patient's husband dropped off Disability Parking Form to be filled out by provider. Patient's husband is requesting a call back at [phone] within 2-5 days when completed. Document is located in providers folder.

## 2024-09-23 ENCOUNTER — Encounter: Payer: Self-pay | Admitting: Family Medicine

## 2024-09-23 ENCOUNTER — Ambulatory Visit: Admitting: Family Medicine

## 2024-09-23 VITALS — BP 143/73 | HR 76 | Temp 97.7°F | Ht 62.0 in | Wt 392.1 lb

## 2024-09-23 DIAGNOSIS — E119 Type 2 diabetes mellitus without complications: Secondary | ICD-10-CM | POA: Diagnosis not present

## 2024-09-23 DIAGNOSIS — J4540 Moderate persistent asthma, uncomplicated: Secondary | ICD-10-CM | POA: Diagnosis not present

## 2024-09-23 DIAGNOSIS — G720 Drug-induced myopathy: Secondary | ICD-10-CM

## 2024-09-23 DIAGNOSIS — Z23 Encounter for immunization: Secondary | ICD-10-CM | POA: Diagnosis not present

## 2024-09-23 DIAGNOSIS — T466X5A Adverse effect of antihyperlipidemic and antiarteriosclerotic drugs, initial encounter: Secondary | ICD-10-CM

## 2024-09-23 DIAGNOSIS — Z7984 Long term (current) use of oral hypoglycemic drugs: Secondary | ICD-10-CM

## 2024-09-23 NOTE — Progress Notes (Addendum)
 "  BP (!) 143/73   Pulse 76   Temp 97.7 F (36.5 C) (Oral)   Ht 5' 2 (1.575 m)   Wt (!) 392 lb 2 oz (177.9 kg)   SpO2 97%   BMI 71.72 kg/m    Subjective:    Patient ID: Mary Webb, female    DOB: 1955-11-21, 68 y.o.   MRN: [id number]  HPI: Mary Webb is a 68 y.o. female  Chief Complaint  Patient presents with   Diabetes    shoe   DIABETES- She has diabetic neuropathy and history of callus formation. No hammer toes or history of amputation. No other concerns or complaints at this time.  Hypoglycemic episodes:no Polydipsia/polyuria: no Visual disturbance: no Chest pain: no Paresthesias: yes Glucose Monitoring: no Taking Insulin ?: no Blood Pressure Monitoring: not checking Retinal Examination: Not up to Date Foot Exam: Up to Date Diabetic Education: Completed Pneumovax: Up to Date Influenza: Not up to Date Aspirin: no   Relevant past medical, surgical, family and social history reviewed and updated as indicated. Interim medical history since our last visit reviewed. Allergies and medications reviewed and updated.  Review of Systems  Constitutional: Negative.   Respiratory: Negative.    Cardiovascular: Negative.   Musculoskeletal: Negative.   Neurological: Negative.   Psychiatric/Behavioral: Negative.      Per HPI unless specifically indicated above     Objective:    BP (!) 143/73   Pulse 76   Temp 97.7 F (36.5 C) (Oral)   Ht 5' 2 (1.575 m)   Wt (!) 392 lb 2 oz (177.9 kg)   SpO2 97%   BMI 71.72 kg/m   Wt Readings from Last 3 Encounters:  09/23/24 (!) 392 lb 2 oz (177.9 kg)  04/18/24 (!) 382 lb (173.3 kg)  03/05/24 (!) 370 lb (167.8 kg)    Physical Exam Vitals and nursing note reviewed.  Constitutional:      General: She is not in acute distress.    Appearance: Normal appearance. She is well-developed. She is obese.  HENT:     Head: Normocephalic and atraumatic.     Right Ear: Hearing and external ear normal.     Left Ear:  Hearing and external ear normal.     Nose: Nose normal.     Mouth/Throat:     Mouth: Mucous membranes are moist.     Pharynx: Oropharynx is clear.  Eyes:     General: Lids are normal. No scleral icterus.       Right eye: No discharge.        Left eye: No discharge.     Conjunctiva/sclera: Conjunctivae normal.  Pulmonary:     Effort: Pulmonary effort is normal. No respiratory distress.  Musculoskeletal:        General: Normal range of motion.  Skin:    Coloration: Skin is not jaundiced or pale.     Findings: No bruising, erythema, lesion or rash.  Neurological:     General: No focal deficit present.     Mental Status: She is alert and oriented to person, place, and time. Mental status is at baseline.  Psychiatric:        Mood and Affect: Mood normal.        Speech: Speech normal.        Behavior: Behavior normal.        Thought Content: Thought content normal.        Judgment: Judgment normal.     Results for orders  placed or performed in visit on 04/18/24  T4   Collection Time: 04/18/24  9:30 AM  Result Value Ref Range   T4, Total 7.9 4.5 - 12.0 ug/dL  CBC with Differential/Platelet   Collection Time: 04/18/24  9:30 AM  Result Value Ref Range   WBC 5.3 3.4 - 10.8 x10E3/uL   RBC 4.80 3.77 - 5.28 x10E6/uL   Hemoglobin 14.1 11.1 - 15.9 g/dL   Hematocrit 55.1 65.9 - 46.6 %   MCV 93 79 - 97 fL   MCH 29.4 26.6 - 33.0 pg   MCHC 31.5 31.5 - 35.7 g/dL   RDW 86.4 88.2 - 84.5 %   Platelets 328 150 - 450 x10E3/uL   Neutrophils 57 Not Estab. %   Lymphs 28 Not Estab. %   Monocytes 11 Not Estab. %   Eos 3 Not Estab. %   Basos 1 Not Estab. %   Neutrophils Absolute 3.1 1.4 - 7.0 x10E3/uL   Lymphocytes Absolute 1.5 0.7 - 3.1 x10E3/uL   Monocytes Absolute 0.6 0.1 - 0.9 x10E3/uL   EOS (ABSOLUTE) 0.1 0.0 - 0.4 x10E3/uL   Basophils Absolute 0.1 0.0 - 0.2 x10E3/uL   Immature Granulocytes 0 Not Estab. %   Immature Grans (Abs) 0.0 0.0 - 0.1 x10E3/uL  Comprehensive metabolic panel  with GFR   Collection Time: 04/18/24  9:30 AM  Result Value Ref Range   Glucose 102 (H) 70 - 99 mg/dL   BUN 11 8 - 27 mg/dL   Creatinine, Ser 8.89 (H) 0.57 - 1.00 mg/dL   eGFR 55 (L) >40 fO/fpw/8.26   BUN/Creatinine Ratio 10 (L) 12 - 28   Sodium 137 134 - 144 mmol/L   Potassium 4.1 3.5 - 5.2 mmol/L   Chloride 99 96 - 106 mmol/L   CO2 24 20 - 29 mmol/L   Calcium  9.3 8.7 - 10.3 mg/dL   Total Protein 7.3 6.0 - 8.5 g/dL   Albumin 3.9 3.9 - 4.9 g/dL   Globulin, Total 3.4 1.5 - 4.5 g/dL   Bilirubin Total 0.4 0.0 - 1.2 mg/dL   Alkaline Phosphatase 83 44 - 121 IU/L   AST 17 0 - 40 IU/L   ALT 17 0 - 32 IU/L  Lipid panel   Collection Time: 04/18/24  9:30 AM  Result Value Ref Range   Cholesterol, Total 167 100 - 199 mg/dL   Triglycerides 94 0 - 149 mg/dL   HDL 52 >60 mg/dL   VLDL Cholesterol Cal 17 5 - 40 mg/dL   LDL Chol Calc (NIH) 98 0 - 99 mg/dL   Chol/HDL Ratio 3.2 0.0 - 4.4 ratio  TSH   Collection Time: 04/18/24  9:30 AM  Result Value Ref Range   TSH 2.400 0.450 - 4.500 uIU/mL  Hemoglobin A1c   Collection Time: 04/18/24  9:30 AM  Result Value Ref Range   Hgb A1c MFr Bld 6.0 (H) 4.8 - 5.6 %   Est. average glucose Bld gHb Est-mCnc 126 mg/dL  Microalbumin, Urine Waived   Collection Time: 04/18/24  9:30 AM  Result Value Ref Range   Microalb, Ur Waived 30 (H) 0 - 19 mg/L   Creatinine, Urine Waived 200 10 - 300 mg/dL   Microalb/Creat Ratio <30 <30 mg/g      Assessment & Plan:   Problem List Items Addressed This Visit       Respiratory   Moderate persistent asthma without complication   Relevant Orders   AMB Referral VBCI Care Management  Endocrine   Diabetes mellitus treated with oral medication (HCC) - Primary   Managed closely by her PCP with last A1c of 6.0. Needs diabetic shoes. Will order. Call with any concerns.         Musculoskeletal and Integument   Statin myopathy   Unable to tolerate statins.       Other Visit Diagnoses       Need for COVID-19  vaccine       COVID given today.   Relevant Orders   Pfizer Comirnaty Covid -19 Vaccine 24yrs and older        Follow up plan: Return for As scheduled.      "

## 2024-09-23 NOTE — Assessment & Plan Note (Signed)
 Managed closely by her PCP with last A1c of 6.0. Needs diabetic shoes. Will order. Call with any concerns.

## 2024-09-23 NOTE — Telephone Encounter (Signed)
 Form completed and signed by the provider.   Patient presented to the office today for an appointment. Form was given to patient at today's visit.

## 2024-09-23 NOTE — Assessment & Plan Note (Signed)
Unable to tolerate statins 

## 2024-09-25 ENCOUNTER — Telehealth: Payer: Self-pay

## 2024-09-25 NOTE — Progress Notes (Signed)
 Care Guide Pharmacy Note  09/25/2024 Name: Mary Webb MRN: [id number] DOB: 05/17/1956  Referred By: Melvin Pao, NP Reason for referral: Complex Care Management (Outreach to schedule with Pharm d )   Mary Webb is a 68 y.o. year old female who is a primary care patient of Melvin Pao, NP.  Mary Webb was referred to the pharmacist for assistance related to: asthma  Successful contact was made with the patient to discuss pharmacy services including being ready for the pharmacist to call at least 5 minutes before the scheduled appointment time and to have medication bottles and any blood pressure readings ready for review. The patient agreed to meet with the pharmacist via telephone visit on (date/time).10/07/2024  Jeoffrey Buffalo , RMA     [place]  [facility], [facility] Guide  Direct Dial: [phone]  Website: [email or web address]

## 2024-10-07 ENCOUNTER — Other Ambulatory Visit: Payer: Self-pay

## 2024-10-07 DIAGNOSIS — J4540 Moderate persistent asthma, uncomplicated: Secondary | ICD-10-CM

## 2024-10-07 NOTE — Progress Notes (Signed)
 10/07/2024 Name: Mary Webb MRN: [id number] DOB: [birth year]  Chief Complaint  Patient presents with   Medication Assistance   Mary Webb is a 68 y.o. year old female who presented for a telephone visit.   They were referred to the pharmacist by their PCP for assistance in managing medication access.   Subjective:  Care Team: Primary Care Provider: Melvin Pao, NP ; Next Scheduled Visit: 10/18/24  Medication Access/Adherence  Current Pharmacy:  [facility] - [place], [place] - [address] [place] [address] Phone: [phone] Fax: [phone]  [facility] - [place], [place] - [address] [place] [address] Phone: [phone] Fax: [phone]  Patient reports affordability concerns with their medications: Yes  Patient reports access/transportation concerns to their pharmacy: No  Patient reports adherence concerns with their medications:  Yes    Moderate Persistent Asthma: Current medications:  patient is prescribed Breo100/26mcg 1 puff daily but is not using due to cost -Patient states she has not picked this medication up, because her copay was going to be >$300 -States she usually just needs the medication part of the year -Not prescribed an albuterol  rescue inhaler -Endorses affordability of all other medications  Objective:  Lab Results  Component Value Date   CREATININE 1.10 (H) 04/18/2024   BUN 11 04/18/2024   NA 137 04/18/2024   K 4.1 04/18/2024   CL 99 04/18/2024   CO2 24 04/18/2024   Medications Reviewed Today     Reviewed by Deanna Channing DELENA, RPH (Pharmacist) on 10/07/24 at [phone]  Med List Status: <None>   Medication Order Taking? Sig Documenting Provider Last Dose Status Informant  Accu-Chek Softclix Lancets lancets [id number]  Use to check blood sugar 3 times a day and document for visits. Goal is <130 fasting and <180 two hours after meal., Melvin Pao, NP  Active    acetaminophen  (TYLENOL ) 500 MG tablet [id number]  Take 2 tablets (1,000 mg total) by mouth every 6 (six) hours as needed. Simaan, Elizabeth S, PA-C  Active   Ascorbic Acid (VITAMIN C) 1000 MG tablet [id number]  Take 1,000 mg by mouth 2 (two) times a week. [provider]  Active Self  azelastine (OPTIVAR) 0.05 % ophthalmic solution [id number]  Apply to eye. [provider]  Active   Blood Glucose Monitoring Suppl (ACCU-CHEK AVIVA PLUS) w/Device KIT [id number]  Use to check blood sugar 3 times a day and document for visits.  Goal is <130 fasting and <180 two hours after meal. Cannady, Jolene T, NP  Active Self  chlorthalidone  (HYGROTON ) 25 MG tablet [id number]  Take 12.5 mg by mouth daily. [provider]  Active            Med Note ZENA, Shashank Kwasnik A   Wed Mar 22, 2023 11:07 AM) One-half tablet (12.5mg ) daily  Cholecalciferol (VITAMIN D3) 1000 units CAPS [id number]  Take 1,000 Units by mouth 3 (three) times a week. [provider]  Active Self  clopidogrel  (PLAVIX ) 75 MG tablet [id number]  Take 1 tablet (75 mg total) by mouth daily. Melvin Pao, NP  Active   Cyanocobalamin (VITAMIN B 12) 500 MCG TABS [id number]  Take 500 mcg by mouth daily. [provider]  Active Self  fluticasone  furoate-vilanterol (BREO ELLIPTA ) 100-25 MCG/ACT AEPB [id number]  Inhale 1 puff into the lungs daily.  Patient not taking: Reported on 10/07/2024   Melvin Pao, NP  Active   gabapentin  (NEURONTIN ) 300 MG capsule  [id number]  Take 1 capsule (300 mg total) by mouth 2 (two) times daily. Melvin Pao, NP  Active   glucose blood (ACCU-CHEK GUIDE TEST) test strip [id number]  Use as instructed Melvin Pao, NP  Active   JARDIANCE  10 MG TABS tablet [id number]  Take 1 tablet (10 mg total) by mouth daily. Melvin Pao, NP  Active   losartan  (COZAAR ) 50 MG tablet [id number]  Take 1 tablet by mouth daily. Patient is taking 25 MG tablet. [provider]  Active    OXYGEN  [id number]  Inhale into the lungs. 3L at night [provider]  Active   potassium chloride  SA (KLOR-CON  M) 20 MEQ tablet [id number]  Take 1 tablet (20 mEq total) by mouth 2 (two) times daily. Melvin Pao, NP  Active   pyridOXINE (VITAMIN B-6) 25 MG tablet [id number]  Take by mouth daily.  [provider]  Active Self           Assessment/Plan:   Moderate Persistent Asthma: -Patient does not qualify for PAP for Breo (program requires $600 be spent OOP on medications 1st) -Should qualify for LIS Medicare Extra Help based on [facility] and resources; application submitted today, and patient should receive a response in 4-6 weeks.  If approved, this would make Breo $12.15 even for a 90 day supply.  If approved, I recommend patient resume daily use. -Coordinating with PCP office to see if they have a sample of Breo or a similar inhaler patient could get in the mean time.  If not, it may be beneficial to prescribe albuterol  HFA to have on had for PRN use.  Follow Up Plan: Will notify patient about any sample availability and check in 4 weeks from now to see if response received from LIS application submitted.  Channing DELENA Mealing, PharmD, DPLA

## 2024-10-07 NOTE — Progress Notes (Signed)
   10/07/2024  Patient ID: Mary Webb, female   DOB: 1956-04-21, 67 y.o.   MRN: [id number]  [facility] setting aside inhaler sample for patient since she does not have Breo, and it will take a few weeks for Medicare Extra Help application to process.  Contacted patient to make her aware, and she will pick this up tomorrow.  Mary Webb, PharmD, DPLA

## 2024-10-09 LAB — OPHTHALMOLOGY REPORT-SCANNED

## 2024-10-15 ENCOUNTER — Other Ambulatory Visit: Payer: Self-pay | Admitting: Nurse Practitioner

## 2024-10-18 ENCOUNTER — Ambulatory Visit: Admitting: Nurse Practitioner

## 2024-10-18 NOTE — Telephone Encounter (Signed)
 Requested medications are due for refill today.  no  Requested medications are on the active medications list.  yes  Last refill. 04/18/2024 #90 4 rf  Future visit scheduled.   yes  Notes to clinic.  Protocol will not attach.    Requested Prescriptions  Pending Prescriptions Disp Refills   JARDIANCE  10 MG TABS tablet [Pharmacy Med Name: JARDIANCE  10MG  TAB] 90 tablet 3    Sig: TAKE ONE TABLET BY MOUTH DAILY     There is no refill protocol information for this order

## 2024-10-23 ENCOUNTER — Telehealth: Payer: Self-pay | Admitting: Nurse Practitioner

## 2024-10-23 NOTE — Telephone Encounter (Unsigned)
 Copied from CRM [id number]. Topic: Clinical - Order For Equipment >> Oct 23, 2024 12:53 PM Shanda MATSU wrote: Reason for CRM: Patient called to check status of diabetic medical shoes, wants to know if prescription for these has been sent to [facility], please contact patient back with a status. >> Oct 23, 2024 12:59 PM Shanda MATSU wrote: [facility] Address: [address] [place], [place] [address] Phone: [phone]

## 2024-10-24 NOTE — CV Procedure (Signed)
°  Device system confirmed to be MRI conditional, with implant date > 6 weeks ago, and no evidence of abandoned or epicardial leads in review of most recent CXR  Device last cleared by EP Provider: Daphne Barrack 10/24/24  Clearance is good through for 1 year as long as parameters remain stable at time of check. If pt undergoes a cardiac device procedure during that time, they should be re-cleared.   Tachy-therapies to be programmed off if applicable with device back to pre-MRI settings after completion of exam.  [employer] - Programming recommendation received through [employer] App/Tablet  Rocky Catalan, RT  10/24/2024 3:38 PM

## 2024-10-24 NOTE — Telephone Encounter (Signed)
 Fax received today from [employer]. Will fill out and then place in providers folder for signature.

## 2024-10-25 NOTE — Telephone Encounter (Signed)
 Form completed and signed by Dr. Vicci. Faxed form back to [employer].   Called and notified patient that this was done for her.

## 2024-10-29 ENCOUNTER — Other Ambulatory Visit: Payer: Self-pay | Admitting: Internal Medicine

## 2024-10-29 ENCOUNTER — Ambulatory Visit (HOSPITAL_COMMUNITY): Admission: RE | Admit: 2024-10-29 | Discharge: 2024-10-29 | Attending: Internal Medicine | Admitting: Internal Medicine

## 2024-10-29 DIAGNOSIS — I429 Cardiomyopathy, unspecified: Secondary | ICD-10-CM

## 2024-10-29 MED ORDER — GADOBUTROL 1 MMOL/ML IV SOLN
10.0000 mL | Freq: Once | INTRAVENOUS | Status: AC | PRN
  Administered 2024-10-29: 14:00:00 10 mL via INTRAVENOUS

## 2024-11-02 ENCOUNTER — Other Ambulatory Visit: Payer: Self-pay | Admitting: Nurse Practitioner

## 2024-11-04 ENCOUNTER — Other Ambulatory Visit (INDEPENDENT_AMBULATORY_CARE_PROVIDER_SITE_OTHER): Payer: Self-pay

## 2024-11-04 ENCOUNTER — Encounter: Payer: Self-pay | Admitting: Nurse Practitioner

## 2024-11-04 ENCOUNTER — Ambulatory Visit: Admitting: Nurse Practitioner

## 2024-11-04 ENCOUNTER — Ambulatory Visit: Payer: Self-pay | Admitting: Nurse Practitioner

## 2024-11-04 VITALS — BP 139/82 | HR 65 | Temp 97.4°F | Ht 62.01 in | Wt 394.8 lb

## 2024-11-04 DIAGNOSIS — Z95811 Presence of heart assist device: Secondary | ICD-10-CM | POA: Diagnosis not present

## 2024-11-04 DIAGNOSIS — Z7984 Long term (current) use of oral hypoglycemic drugs: Secondary | ICD-10-CM

## 2024-11-04 DIAGNOSIS — E1122 Type 2 diabetes mellitus with diabetic chronic kidney disease: Secondary | ICD-10-CM

## 2024-11-04 DIAGNOSIS — I130 Hypertensive heart and chronic kidney disease with heart failure and stage 1 through stage 4 chronic kidney disease, or unspecified chronic kidney disease: Secondary | ICD-10-CM

## 2024-11-04 DIAGNOSIS — N1832 Chronic kidney disease, stage 3b: Secondary | ICD-10-CM | POA: Diagnosis not present

## 2024-11-04 DIAGNOSIS — J4540 Moderate persistent asthma, uncomplicated: Secondary | ICD-10-CM | POA: Diagnosis not present

## 2024-11-04 DIAGNOSIS — I129 Hypertensive chronic kidney disease with stage 1 through stage 4 chronic kidney disease, or unspecified chronic kidney disease: Secondary | ICD-10-CM

## 2024-11-04 DIAGNOSIS — E119 Type 2 diabetes mellitus without complications: Secondary | ICD-10-CM

## 2024-11-04 DIAGNOSIS — R300 Dysuria: Secondary | ICD-10-CM | POA: Diagnosis not present

## 2024-11-04 DIAGNOSIS — D352 Benign neoplasm of pituitary gland: Secondary | ICD-10-CM | POA: Diagnosis not present

## 2024-11-04 DIAGNOSIS — I509 Heart failure, unspecified: Secondary | ICD-10-CM

## 2024-11-04 DIAGNOSIS — E7800 Pure hypercholesterolemia, unspecified: Secondary | ICD-10-CM | POA: Diagnosis not present

## 2024-11-04 LAB — URINALYSIS, ROUTINE W REFLEX MICROSCOPIC
Bilirubin, UA: NEGATIVE
Glucose, UA: NEGATIVE
Ketones, UA: NEGATIVE
Leukocytes,UA: NEGATIVE
Nitrite, UA: NEGATIVE
Protein,UA: NEGATIVE
RBC, UA: NEGATIVE
Specific Gravity, UA: 1.02 (ref 1.005–1.030)
Urobilinogen, Ur: 0.2 mg/dL (ref 0.2–1.0)
pH, UA: 6 (ref 5.0–7.5)

## 2024-11-04 MED ORDER — FLUTICASONE-SALMETEROL 250-50 MCG/ACT IN AEPB: 1.0000 | INHALATION_SPRAY | Freq: Two times a day (BID) | RESPIRATORY_TRACT | 3 refills | Status: DC

## 2024-11-04 NOTE — Assessment & Plan Note (Signed)
 Chronic, ongoing  She is taking Jardiance  10 mg, chlorthalidone  12.5mg  and Losartan  50mg  PO daily  Continue medications.  Follow up with cardiology.  Recheck CMP today for monitoring  Follow up in 6 months or sooner if concerns arise

## 2024-11-04 NOTE — Assessment & Plan Note (Signed)
 Chronic, controlled.  Not able to tolerate statins.  Encouraged diet improvement.  Labs ordered at visit today.  Follow up in 6 months.  Call sooner if concerns arise.

## 2024-11-04 NOTE — Assessment & Plan Note (Signed)
 Chronic. Working with Pharmacist to get Breo inhaler.  She has a call with PharmD today.

## 2024-11-04 NOTE — Progress Notes (Signed)
" ° °  11/04/2024  Patient ID: Mary Webb, female   DOB: 01/30/1956, 68 y.o.   MRN: [id number]  Outreach attempt to see if patient has received communication from LIS Medicare Extra Help application we submitted approximately 4 weeks ago.  I was not able to reach the patient but did leave a HIPAA compliant voicemail with my direct number.\  I contacted [facility] to have them process Breo prescription, and this is still coming back with a copay >$200; so it is likely patient's LIS application has not yet been processed.  I will attempt to contact [name] again this afternoon; but if I do not connect with the patient today, I will try to contact again next week.  Mary Webb, PharmD, DPLA  "

## 2024-11-04 NOTE — Progress Notes (Signed)
" ° °  11/04/2024  Patient ID: Mary Webb, female   DOB: 1956/08/28, 68 y.o.   MRN: [id number]  Patient returned my call, and she was denied LIS Medicare Extra Help based on HHI.  She was provided a sample of either Trelegy or Breztri (was not home to confirm) from [facility] last month, but she only used the inhaler once, because it made her mouth sore.  Upon reviewing her plan formulary, Napoleon and Bernida are Tier 1 medications, which would be the cheapest alternatives to the [facility] she has used successfully in the past.  Ranell contains both fluticasone  (ICS) and vilanterol (LABA); and this medication is a dry powder inhaled as 1 puff per day from an ellipta device.  Wixela is fluticasone  (ICS) and salmeterol (LABA); this is also a dry powder in a similar device, but it is dosed as 1 puff twice daily.  Bernida is budesonide (ICS) and formoterol (LABA) that comes in an aerosol, or spray, that is dosed as 2 puffs twice daily.  Based on closer product similarity to [facility], I recommend patient try Wixela 250/50 1 puff BID.  Order pending for PCP to sign if in agreement.  If/when signed, I will contact pharmacy to check copay and follow-up with patient.  Channing DELENA Mealing, PharmD, DPLA    "

## 2024-11-04 NOTE — Assessment & Plan Note (Signed)
Chronic.  No new neurologic issues. Continue plavix and lifestyle modifications.  Will continue to reassess at future visits.

## 2024-11-04 NOTE — Assessment & Plan Note (Signed)
 Chronic.  Appears to be controlled at this time.  Euvolemic in office today.  - Reminded to call for an overnight weight gain of >2 pounds or a weekly weight gain of >5 pounds - not adding salt to food and read food labels. Reviewed the importance of keeping daily sodium intake to 2000mg  daily. - Avoid Ibuprofen products.

## 2024-11-04 NOTE — Progress Notes (Addendum)
" ° °  11/04/2024  Patient ID: Mary Webb, female   DOB: Dec 01, 1955, 68 y.o.   MRN: [id number]  Patient returned my call, and Breztri is the sample inhaler she has at home and has only used once due to mouth soreness when she used it.  Advised her the cost of Wixela and that if she can tolerate Breztri, we could get her enrolled in [place] PAP for this medication more than likely.  Patient will retry Breztri; advised on proper administration and rising mouth after each use with warm water.  I will follow-up with the patient next week to see how she is tolerating the medication.  Channing DELENA Mealing, PharmD, DPLA  "

## 2024-11-04 NOTE — Progress Notes (Signed)
 "  BP 139/82 (BP Location: Right Wrist, Cuff Size: Normal)   Pulse 65   Temp (!) 97.4 F (36.3 C) (Oral)   Ht 5' 2.01 (1.575 m)   Wt (!) 394 lb 12.8 oz (179.1 kg)   SpO2 98%   BMI 72.19 kg/m    Subjective:    Patient ID: Mary Webb, female    DOB: 1956/03/21, 68 y.o.   MRN: [id number]  HPI: Mary Webb is a 68 y.o. female  Chief Complaint  Patient presents with   office visit    6 month F/u. Patient stated she has carpal tunnel in her left hand and there is pain in her knees    HYPERTENSION / HYPERLIPIDEMIA Had a new Pacemaker in March 2024.  Followed by Dr. Florencio.  She is taking Chlorthalidone  12.5mg  and Losartan  was decreased from 100mg  to 50mg .   Satisfied with current treatment? no Duration of hypertension: years BP monitoring frequency: occasional BP range: 100-120/60-80 BP medication side effects: no Past BP meds: chlorthalidone  and losartan  (cozaar ) Duration of hyperlipidemia: years Cholesterol medication side effects: no Cholesterol supplements: none Past cholesterol medications: none Medication compliance: excellent compliance Aspirin: no Recent stressors: no Recurrent headaches: no Visual changes: no Palpitations: no Dyspnea: better Chest pain: no Lower extremity edema: no Dizzy/lightheaded: no  DIABETES She is only taking Gabapentin  once in a while.  Patient states she was taken off Jardiance  due to a UTI.  She is not currently taking anything for her DM. Hypoglycemic episodes:no Polydipsia/polyuria: no Visual disturbance: no Chest pain: no Paresthesias: no Glucose Monitoring: no  Accucheck frequency: daily  Fasting glucose: 80-110  Post prandial:  Evening:  Before meals: Taking Insulin ?: no  Long acting insulin :  Short acting insulin : Blood Pressure Monitoring: weekly Retinal Examination: Up to Date Foot Exam: Up to Date Diabetic Education: Not Completed Pneumovax: Up to Date Influenza: Not up to Date Aspirin:  yes  Patient will be speaking with the pharmacist today to discuss BREO inhaler. She is not currently using the inhaler due to cost.   Patient was seen by Ortho for carpal tunnel.  Has testing scheduled for next week.  She is scheduled for a knee appointment to discuss her options.   Patient states she has been having dysuria.  She was seen by GYN for UTI in November.  Would like to have her urine tested today.   Relevant past medical, surgical, family and social history reviewed and updated as indicated. Interim medical history since our last visit reviewed. Allergies and medications reviewed and updated.  Review of Systems  Eyes:  Negative for visual disturbance.  Respiratory:  Negative for cough, chest tightness and shortness of breath.   Cardiovascular:  Negative for chest pain, palpitations and leg swelling.  Endocrine: Negative for polydipsia and polyuria.  Genitourinary:  Positive for dysuria.  Neurological:  Negative for dizziness, numbness and headaches.    Per HPI unless specifically indicated above     Objective:    BP 139/82 (BP Location: Right Wrist, Cuff Size: Normal)   Pulse 65   Temp (!) 97.4 F (36.3 C) (Oral)   Ht 5' 2.01 (1.575 m)   Wt (!) 394 lb 12.8 oz (179.1 kg)   SpO2 98%   BMI 72.19 kg/m   Wt Readings from Last 3 Encounters:  11/04/24 (!) 394 lb 12.8 oz (179.1 kg)  09/23/24 (!) 392 lb 2 oz (177.9 kg)  04/18/24 (!) 382 lb (173.3 kg)    Physical Exam Vitals and  nursing note reviewed.  Constitutional:      General: She is not in acute distress.    Appearance: Normal appearance. She is obese. She is not ill-appearing, toxic-appearing or diaphoretic.  HENT:     Head: Normocephalic.     Right Ear: External ear normal.     Left Ear: External ear normal.     Nose: Nose normal.     Mouth/Throat:     Mouth: Mucous membranes are moist.     Pharynx: Oropharynx is clear.  Eyes:     General:        Right eye: No discharge.        Left eye: No  discharge.     Extraocular Movements: Extraocular movements intact.     Conjunctiva/sclera: Conjunctivae normal.     Pupils: Pupils are equal, round, and reactive to light.  Cardiovascular:     Rate and Rhythm: Normal rate and regular rhythm.     Heart sounds: No murmur heard. Pulmonary:     Effort: Pulmonary effort is normal. No respiratory distress.     Breath sounds: Normal breath sounds. No wheezing or rales.  Musculoskeletal:     Cervical back: Normal range of motion and neck supple.  Skin:    General: Skin is warm and dry.     Capillary Refill: Capillary refill takes less than 2 seconds.  Neurological:     General: No focal deficit present.     Mental Status: She is alert and oriented to person, place, and time. Mental status is at baseline.  Psychiatric:        Mood and Affect: Mood normal.        Behavior: Behavior normal.        Thought Content: Thought content normal.        Judgment: Judgment normal.     Results for orders placed or performed in visit on 10/16/24  OPHTHALMOLOGY REPORT-SCANNED   Collection Time: 10/09/24  9:10 AM  Result Value Ref Range   HM Diabetic Eye Exam No Retinopathy No Retinopathy   A Comment        Assessment & Plan:   Problem List Items Addressed This Visit       Cardiovascular and Mediastinum   Congestive heart failure, unspecified HF chronicity, unspecified heart failure type (HCC)   Chronic.  Appears to be controlled at this time.  Euvolemic in office today.  - Reminded to call for an overnight weight gain of >2 pounds or a weekly weight gain of >5 pounds - not adding salt to food and read food labels. Reviewed the importance of keeping daily sodium intake to 2000mg  daily. - Avoid Ibuprofen products.        Respiratory   Moderate persistent asthma without complication   Chronic. Working with Pharmacist to get Breo inhaler.  She has a call with PharmD today.        Endocrine   Pituitary adenoma (HCC) (Chronic)   Chronic.   No new neurologic issues. Continue plavix  and lifestyle modifications.  Will continue to reassess at future visits.      Diabetes mellitus treated with oral medication (HCC)   Chronic. Not currently taking any medication for DM.  Specialist told patient to stop Jardiance  due to UTI.  Labs ordered at visit today.  She will be getting diabetic shoes when they come in. Microalbumin and Eye exam up to date. Follow up in 6 months.  Call sooner if concerns arise.  Relevant Orders   Hemoglobin A1c     Genitourinary   Hypertensive kidney disease with CKD stage III (HCC)   Chronic, ongoing  She is taking Jardiance  10 mg, chlorthalidone  12.5mg  and Losartan  50mg  PO daily  Continue medications.  Follow up with cardiology.  Recheck CMP today for monitoring  Follow up in 6 months or sooner if concerns arise       Relevant Orders   Comprehensive metabolic panel with GFR     Other   Hyperlipidemia (Chronic)   Chronic, controlled.  Not able to tolerate statins.  Encouraged diet improvement.  Labs ordered at visit today.  Follow up in 6 months.  Call sooner if concerns arise.       Relevant Orders   Lipid panel   Morbid obesity (HCC)   Recommended eating smaller high protein, low fat meals more frequently and exercising 30 mins a day 5 times a week with a goal of 10-15lb weight loss in the next 3 months.       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  No current concerns regarding implanted device.  Followed by Dr. Florencio.  Reviewed recent note.       Other Visit Diagnoses       Dysuria       UA ordered at visit today.   Relevant Orders   Urinalysis, Routine w reflex microscopic        Follow up plan: No follow-ups on file.      "

## 2024-11-04 NOTE — Assessment & Plan Note (Signed)
 Recommended eating smaller high protein, low fat meals more frequently and exercising 30 mins a day 5 times a week with a goal of 10-15lb weight loss in the next 3 months.

## 2024-11-04 NOTE — Progress Notes (Signed)
" ° °  11/04/2024  Patient ID: Mary Webb, female   DOB: 1956/10/12, 68 y.o.   MRN: [id number]  Order for Wixela 250/50 sent to [facility]; contacted the pharmacy, and medication is going through for $91.  This would be around $57 on [facility] at [facility].  Verified there are no open grants to help with affordability of asthma medications, and contacted [facility] to verify patient would no qualify for their free pharmacy program.  Cheapest option would be [facility] at [facility] on [facility] coupon after checking other medication prices and other pharmacy options.  Attempted to follow-up with patient to see I this will be affordable, but I was not able to reach  her.  HIPAA compliant voicemail was left stating I would try to reach her again tomorrow morning at 10am.  Channing DELENA Mealing, PharmD, DPLA  "

## 2024-11-04 NOTE — Assessment & Plan Note (Signed)
 New pacemaker placed in March 2024.  No current concerns regarding implanted device.  Followed by Dr. Beau Bound.  Reviewed recent note.

## 2024-11-04 NOTE — Assessment & Plan Note (Signed)
 Chronic. Not currently taking any medication for DM.  Specialist told patient to stop Jardiance  due to UTI.  Labs ordered at visit today.  She will be getting diabetic shoes when they come in. Microalbumin and Eye exam up to date. Follow up in 6 months.  Call sooner if concerns arise.

## 2024-11-05 ENCOUNTER — Other Ambulatory Visit

## 2024-11-05 LAB — LIPID PANEL
Chol/HDL Ratio: 3.1 ratio (ref 0.0–4.4)
Cholesterol, Total: 177 mg/dL (ref 100–199)
HDL: 58 mg/dL
LDL Chol Calc (NIH): 102 mg/dL — ABNORMAL HIGH (ref 0–99)
Triglycerides: 95 mg/dL (ref 0–149)
VLDL Cholesterol Cal: 17 mg/dL (ref 5–40)

## 2024-11-05 LAB — COMPREHENSIVE METABOLIC PANEL WITH GFR
ALT: 19 IU/L (ref 0–32)
AST: 20 IU/L (ref 0–40)
Albumin: 4.1 g/dL (ref 3.9–4.9)
Alkaline Phosphatase: 72 IU/L (ref 49–135)
BUN/Creatinine Ratio: 11 — ABNORMAL LOW (ref 12–28)
BUN: 12 mg/dL (ref 8–27)
Bilirubin Total: 0.6 mg/dL (ref 0.0–1.2)
CO2: 25 mmol/L (ref 20–29)
Calcium: 9.6 mg/dL (ref 8.7–10.3)
Chloride: 98 mmol/L (ref 96–106)
Creatinine, Ser: 1.08 mg/dL — ABNORMAL HIGH (ref 0.57–1.00)
Globulin, Total: 3.7 g/dL (ref 1.5–4.5)
Glucose: 104 mg/dL — ABNORMAL HIGH (ref 70–99)
Potassium: 4.1 mmol/L (ref 3.5–5.2)
Sodium: 138 mmol/L (ref 134–144)
Total Protein: 7.8 g/dL (ref 6.0–8.5)
eGFR: 56 mL/min/1.73 — ABNORMAL LOW

## 2024-11-05 LAB — HEMOGLOBIN A1C
Est. average glucose Bld gHb Est-mCnc: 128 mg/dL
Hgb A1c MFr Bld: 6.1 % — ABNORMAL HIGH (ref 4.8–5.6)

## 2024-11-06 NOTE — Progress Notes (Signed)
" ° °  11/06/2024  Patient ID: Mary Webb, female   DOB: April 16, 1956, 68 y.o.   MRN: [id number]  Patient outreach to follow-up on tolerance/efficacy of Breztri.  Patient has not resumed using samples provided by [facility], but she states that she will.  Reminded her to rinse mouth out thoroughly with warm water after each use to see if this will prevent soreness in the mouth/throat.  Advised patient that if this inhaler works well for her, we should be able to get it through AZ&Me PAP since she was denied LIS, she will not qualify for Trelegy PAP until $600 is paid out of pocket on medications, and generic inhaler alternatives were also not affordable on her plan.  Telephone visit in 2 weeks to see how she is tolerating medication.  Mary Webb, PharmD, DPLA   "

## 2024-11-11 ENCOUNTER — Other Ambulatory Visit (INDEPENDENT_AMBULATORY_CARE_PROVIDER_SITE_OTHER): Payer: Self-pay

## 2024-11-11 ENCOUNTER — Telehealth: Payer: Self-pay

## 2024-11-11 DIAGNOSIS — J4540 Moderate persistent asthma, uncomplicated: Secondary | ICD-10-CM

## 2024-11-11 NOTE — Telephone Encounter (Signed)
 Copied from CRM [id number]. Topic: Clinical - Order For Equipment >> Oct 23, 2024 12:53 PM Shanda MATSU wrote: Reason for CRM: Patient called to check status of diabetic medical shoes, wants to know if prescription for these has been sent to [facility], please contact patient back with a status. >> Nov 11, 2024  9:32 AM Leonette SQUIBB wrote: Patient called saying the [facility] needs paperwork to complete the order for the diabetic shoes for the patient and her husband.   >> Oct 23, 2024 12:59 PM Shanda MATSU wrote: [facility] Address: [address] [place], [place] [address] Phone: [phone]

## 2024-11-11 NOTE — Telephone Encounter (Signed)
 Contacted [facility] as paperwork has previously been sent back to them. [facility] states that they need the office visit notes faxed over to them. Will print note and fax to [facility].

## 2024-11-11 NOTE — Telephone Encounter (Signed)
 Copied from CRM #[id number]. Topic: General - Call Back - No Documentation >> Nov 11, 2024  1:37 PM Olam RAMAN wrote: Reason for CRM: dasie from [facility], diabetic shoes. and needed office notes sent if everything was normal. and paper was sent not related to foot exam Fax: [phone]

## 2024-11-12 NOTE — Telephone Encounter (Signed)
 Waiting for provider to return to the office to discuss note.

## 2024-11-18 NOTE — Telephone Encounter (Signed)
 Per [employer], office visit notes do not have enough information on the diabetic foot exam. Can this information be added to the office visit note for the patients shoes?

## 2024-11-19 NOTE — Telephone Encounter (Signed)
 I don't know what else they need- can we please get additional information.

## 2024-11-20 NOTE — Telephone Encounter (Signed)
 Addended.

## 2024-11-20 NOTE — Telephone Encounter (Signed)
 Corrected notes have been sent. [facility] confirmed they only needed the updates notes as they already have the rest of the paperwork.

## 2024-11-25 NOTE — Progress Notes (Unsigned)
" ° °  11/26/24  Patient ID: Mary Webb, female   DOB: 08/08/1956, 69 y.o.   MRN: [id number]  Patient outreach attempt to see how patient is doing with Breztri samples to determine if we should move forward with applying for AZ&Me PAP.  I was not able to reach the patient, but I did leave a HIPAA compliant voicemail with my direct number and will try to call again next week if I do not hear back.  Mary Webb, PharmD, DPLA   "

## 2024-11-26 ENCOUNTER — Other Ambulatory Visit: Payer: Self-pay

## 2024-12-01 ENCOUNTER — Other Ambulatory Visit: Payer: Self-pay | Admitting: Nurse Practitioner

## 2024-12-02 NOTE — Telephone Encounter (Signed)
 Requested Prescriptions  Pending Prescriptions Disp Refills   potassium chloride  SA (KLOR-CON  M) 20 MEQ tablet [Pharmacy Med Name: POTASSIUM CL ER 20 MEQ TABLET] 180 tablet 0    Sig: Take 1 tablet (20 mEq total) by mouth 2 (two) times daily.     Endocrinology:  Minerals - Potassium Supplementation Failed - 12/02/2024  5:45 PM      Failed - Cr in normal range and within 360 days    Creatinine  Date Value Ref Range Status  07/12/2014 1.37 (H) 0.60 - 1.30 mg/dL Final   Creatinine, Ser  Date Value Ref Range Status  11/04/2024 1.08 (H) 0.57 - 1.00 mg/dL Final         Passed - K in normal range and within 360 days    Potassium  Date Value Ref Range Status  11/04/2024 4.1 3.5 - 5.2 mmol/L Final  07/12/2014 3.4 (L) 3.5 - 5.1 mmol/L Final         Passed - Valid encounter within last 12 months    Recent Outpatient Visits           4 weeks ago Presence of heart assist device [facility])   [place] [facility] Melvin Pao, NP   2 months ago Diabetes mellitus treated with oral medication (HCC)   [place] [facility] [place], Megan P, DO   7 months ago Annual physical exam   [place] [facility] Melvin Pao, NP   10 months ago Sick sinus syndrome [facility])    [place] [facility] Melvin Pao, NP

## 2024-12-04 ENCOUNTER — Other Ambulatory Visit (INDEPENDENT_AMBULATORY_CARE_PROVIDER_SITE_OTHER): Payer: Self-pay

## 2024-12-04 DIAGNOSIS — J4540 Moderate persistent asthma, uncomplicated: Secondary | ICD-10-CM

## 2024-12-04 NOTE — Progress Notes (Signed)
" ° °  11/26/24  Patient ID: Mary Webb, female   DOB: April 19, 1956, 69 y.o.   MRN: [id number]  Patient outreach to see how [name] is tolerating the Breztri sample she was going to resume using after our last telephone call.  She did not resume use and states she has a follow-up with pulmonology today, and they are supposed to order a sleep study and then further discuss inhaler options for patient's intermittent asthma.  Advised patient to reach out to me if any further assistance with medications is needed.  Mary Webb, PharmD, DPLA   "

## 2024-12-06 ENCOUNTER — Other Ambulatory Visit: Payer: Self-pay | Admitting: Nurse Practitioner

## 2024-12-06 DIAGNOSIS — I495 Sick sinus syndrome: Secondary | ICD-10-CM

## 2024-12-06 MED ORDER — CLOPIDOGREL BISULFATE 75 MG PO TABS: 75.0000 mg | ORAL_TABLET | Freq: Every day | ORAL | 1 refills | Status: DC

## 2024-12-06 NOTE — Telephone Encounter (Signed)
 Copied from CRM [id number]. Topic: Clinical - Medication Refill >> Dec 06, 2024 12:53 PM Ivette P wrote: Medication: clopidogrel  (PLAVIX ) 75 MG tablet  Has the patient contacted their pharmacy? Yes (Agent: If no, request that the patient contact the pharmacy for the refill. If patient does not wish to contact the pharmacy document the reason why and proceed with request.) (Agent: If yes, when and what did the pharmacy advise?)  This is the patient's preferred pharmacy:  [facility] - [place], [place] - [address] [place] [address] Phone: [phone] Fax: [phone]  Is this the correct pharmacy for this prescription? Yes If no, delete pharmacy and type the correct one.   Has the prescription been filled recently? No  Is the patient out of the medication? No  Has the patient been seen for an appointment in the last year OR does the patient have an upcoming appointment? Yes  Can we respond through MyChart? No  Agent: Please be advised that Rx refills may take up to 3 business days. We ask that you follow-up with your pharmacy.

## 2024-12-06 NOTE — Telephone Encounter (Signed)
 Copied from CRM [id number]. Topic: Clinical - Medication Refill >> Dec 06, 2024 12:53 PM Ivette P wrote: Medication: clopidogrel  (PLAVIX ) 75 MG tablet  Has the patient contacted their pharmacy? Yes (Agent: If no, request that the patient contact the pharmacy for the refill. If patient does not wish to contact the pharmacy document the reason why and proceed with request.) (Agent: If yes, when and what did the pharmacy advise?)  This is the patient's preferred pharmacy:  [facility] - [place], [place] - [address] [place] [address] Phone: [phone] Fax: [phone]  Is this the correct pharmacy for this prescription? Yes If no, delete pharmacy and type the correct one.   Has the prescription been filled recently? No  Is the patient out of the medication? No  Has the patient been seen for an appointment in the last year OR does the patient have an upcoming appointment? Yes  Can we respond through MyChart? No  Agent: Please be advised that Rx refills may take up to 3 business days. We ask that you follow-up with your pharmacy.

## 2024-12-06 NOTE — Telephone Encounter (Signed)
 Per chart- continue Requested Prescriptions  Pending Prescriptions Disp Refills   clopidogrel  (PLAVIX ) 75 MG tablet 90 tablet 1    Sig: Take 1 tablet (75 mg total) by mouth daily.     Hematology: Antiplatelets - clopidogrel  Failed - 12/06/2024  2:37 PM      Failed - HCT in normal range and within 180 days    Hematocrit  Date Value Ref Range Status  04/18/2024 44.8 34.0 - 46.6 % Final         Failed - HGB in normal range and within 180 days    Hemoglobin  Date Value Ref Range Status  04/18/2024 14.1 11.1 - 15.9 g/dL Final         Failed - PLT in normal range and within 180 days    Platelets  Date Value Ref Range Status  04/18/2024 328 150 - 450 x10E3/uL Final         Failed - Cr in normal range and within 360 days    Creatinine  Date Value Ref Range Status  07/12/2014 1.37 (H) 0.60 - 1.30 mg/dL Final   Creatinine, Ser  Date Value Ref Range Status  11/04/2024 1.08 (H) 0.57 - 1.00 mg/dL Final         Passed - Valid encounter within last 6 months    Recent Outpatient Visits           1 month ago Presence of heart assist device [facility])   [place] [facility] Melvin Pao, NP   2 months ago Diabetes mellitus treated with oral medication (HCC)   [place] [facility] [place], Megan P, DO   7 months ago Annual physical exam   [place] [facility] Melvin Pao, NP   10 months ago Sick sinus syndrome [facility])   [place] [facility] Melvin Pao, NP

## 2024-12-09 MED ORDER — CLOPIDOGREL BISULFATE 75 MG PO TABS: 75.0000 mg | ORAL_TABLET | Freq: Every day | ORAL | 1 refills | Status: AC

## 2024-12-09 NOTE — Telephone Encounter (Signed)
 Prescription sent to the pharmacy.

## 2024-12-09 NOTE — Addendum Note (Signed)
 Addended by: MELVIN PAO on: 12/09/2024 10:03 AM   Modules accepted: Orders

## 2025-03-18 ENCOUNTER — Ambulatory Visit

## 2025-05-05 ENCOUNTER — Encounter: Admitting: Nurse Practitioner
